# Patient Record
Sex: Male | Born: 1945 | Race: White | Hispanic: No | Marital: Single | Smoking: Former smoker
Health system: Southern US, Community
[De-identification: ages and names within clinical notes are randomized; demographics above are authoritative.]

## PROBLEM LIST (undated history)

## (undated) DIAGNOSIS — F329 Major depressive disorder, single episode, unspecified: Secondary | ICD-10-CM

## (undated) DIAGNOSIS — Z66 Do not resuscitate: Secondary | ICD-10-CM

## (undated) DIAGNOSIS — R188 Other ascites: Secondary | ICD-10-CM

## (undated) DIAGNOSIS — K429 Umbilical hernia without obstruction or gangrene: Secondary | ICD-10-CM

## (undated) DIAGNOSIS — I10 Essential (primary) hypertension: Secondary | ICD-10-CM

## (undated) DIAGNOSIS — F419 Anxiety disorder, unspecified: Secondary | ICD-10-CM

## (undated) DIAGNOSIS — E871 Hypo-osmolality and hyponatremia: Secondary | ICD-10-CM

## (undated) DIAGNOSIS — K219 Gastro-esophageal reflux disease without esophagitis: Secondary | ICD-10-CM

## (undated) DIAGNOSIS — F32A Depression, unspecified: Secondary | ICD-10-CM

## (undated) DIAGNOSIS — K7460 Unspecified cirrhosis of liver: Secondary | ICD-10-CM

## (undated) HISTORY — PX: COLOSTOMY: SHX63

## (undated) HISTORY — PX: COLON SURGERY: SHX602

## (undated) HISTORY — PX: CHOLECYSTECTOMY: SHX55

## (undated) HISTORY — PX: THROAT SURGERY: SHX803

---

## 2011-06-11 ENCOUNTER — Ambulatory Visit: Payer: Self-pay | Admitting: Internal Medicine

## 2011-06-18 ENCOUNTER — Inpatient Hospital Stay: Payer: Self-pay | Admitting: Internal Medicine

## 2011-07-12 ENCOUNTER — Ambulatory Visit: Payer: Self-pay | Admitting: Internal Medicine

## 2011-07-24 ENCOUNTER — Ambulatory Visit: Payer: Self-pay

## 2012-06-10 ENCOUNTER — Ambulatory Visit: Payer: Self-pay | Admitting: Unknown Physician Specialty

## 2013-01-05 ENCOUNTER — Ambulatory Visit: Payer: Self-pay | Admitting: Family Medicine

## 2013-08-04 ENCOUNTER — Inpatient Hospital Stay: Payer: Self-pay | Admitting: Student

## 2013-08-04 LAB — COMPREHENSIVE METABOLIC PANEL
Albumin: 3.3 g/dL — ABNORMAL LOW (ref 3.4–5.0)
Alkaline Phosphatase: 200 U/L — ABNORMAL HIGH (ref 50–136)
Anion Gap: 15 (ref 7–16)
Bilirubin,Total: 3.4 mg/dL — ABNORMAL HIGH (ref 0.2–1.0)
Calcium, Total: 7.8 mg/dL — ABNORMAL LOW (ref 8.5–10.1)
Chloride: 94 mmol/L — ABNORMAL LOW (ref 98–107)
Co2: 21 mmol/L (ref 21–32)
Creatinine: 0.84 mg/dL (ref 0.60–1.30)
EGFR (Non-African Amer.): >60
Glucose: 97 mg/dL (ref 65–99)
Osmolality: 260 (ref 275–301)
Potassium: 3.3 mmol/L — ABNORMAL LOW (ref 3.5–5.1)
SGOT(AST): 262 U/L — ABNORMAL HIGH (ref 15–37)
Total Protein: 7.3 g/dL (ref 6.4–8.2)

## 2013-08-04 LAB — ETHANOL: Ethanol: 192 mg/dL

## 2013-08-04 LAB — URINALYSIS, COMPLETE
Bilirubin,UR: NEGATIVE
Glucose,UR: NEGATIVE mg/dL (ref 0–75)
Ph: 6 (ref 4.5–8.0)
Protein: NEGATIVE
Squamous Epithelial: NONE SEEN
WBC UR: <1 /HPF (ref 0–5)

## 2013-08-04 LAB — DRUG SCREEN, URINE
Barbiturates, Ur Screen: NEGATIVE (ref ?–200)
Cannabinoid 50 Ng, Ur ~~LOC~~: NEGATIVE (ref ?–50)
Cocaine Metabolite,Ur ~~LOC~~: NEGATIVE (ref ?–300)
Methadone, Ur Screen: NEGATIVE (ref ?–300)
Phencyclidine (PCP) Ur S: NEGATIVE (ref ?–25)

## 2013-08-04 LAB — CBC
HCT: 30.4 % — ABNORMAL LOW (ref 40.0–52.0)
HGB: 10.5 g/dL — ABNORMAL LOW (ref 13.0–18.0)
MCHC: 34.6 g/dL (ref 32.0–36.0)
MCV: 85 fL (ref 80–100)
Platelet: 51 10*3/uL — ABNORMAL LOW (ref 150–440)
RDW: 20.9 % — ABNORMAL HIGH (ref 11.5–14.5)

## 2013-08-04 LAB — PROTIME-INR
INR: 1.2
Prothrombin Time: 14.9 secs — ABNORMAL HIGH (ref 11.5–14.7)

## 2013-08-05 LAB — MAGNESIUM: Magnesium: 2.2 mg/dL

## 2013-08-05 LAB — CBC WITH DIFFERENTIAL/PLATELET
Basophil %: 1 %
Eosinophil #: 0 10*3/uL (ref 0.0–0.7)
HCT: 29.8 % — ABNORMAL LOW (ref 40.0–52.0)
HGB: 10.1 g/dL — ABNORMAL LOW (ref 13.0–18.0)
Lymphocyte #: 0.4 10*3/uL — ABNORMAL LOW (ref 1.0–3.6)
Lymphocyte %: 17 %
MCH: 29.4 pg (ref 26.0–34.0)
MCHC: 33.9 g/dL (ref 32.0–36.0)
Monocyte #: 0.4 x10 3/mm (ref 0.2–1.0)
Neutrophil #: 1.5 10*3/uL (ref 1.4–6.5)
Neutrophil %: 65.4 %
RBC: 3.44 10*6/uL — ABNORMAL LOW (ref 4.40–5.90)
WBC: 2.3 10*3/uL — ABNORMAL LOW (ref 3.8–10.6)

## 2013-08-05 LAB — COMPREHENSIVE METABOLIC PANEL
Albumin: 3 g/dL — ABNORMAL LOW (ref 3.4–5.0)
Alkaline Phosphatase: 180 U/L — ABNORMAL HIGH (ref 50–136)
Anion Gap: 7 (ref 7–16)
BUN: 11 mg/dL (ref 7–18)
Bilirubin,Total: 3.3 mg/dL — ABNORMAL HIGH (ref 0.2–1.0)
Calcium, Total: 7.3 mg/dL — ABNORMAL LOW (ref 8.5–10.1)
Chloride: 100 mmol/L (ref 98–107)
Co2: 26 mmol/L (ref 21–32)
Creatinine: 1.04 mg/dL (ref 0.60–1.30)
EGFR (African American): >60
EGFR (Non-African Amer.): >60
Glucose: 108 mg/dL — ABNORMAL HIGH (ref 65–99)
SGOT(AST): 261 U/L — ABNORMAL HIGH (ref 15–37)
SGPT (ALT): 84 U/L — ABNORMAL HIGH (ref 12–78)
Sodium: 133 mmol/L — ABNORMAL LOW (ref 136–145)
Total Protein: 6.7 g/dL (ref 6.4–8.2)

## 2013-08-05 LAB — TSH: Thyroid Stimulating Horm: 3.29 u[IU]/mL

## 2013-08-05 LAB — LIPID PANEL
Cholesterol: 282 mg/dL — ABNORMAL HIGH (ref 0–200)
HDL Cholesterol: 55 mg/dL (ref 40–60)
Ldl Cholesterol, Calc: 212 mg/dL — ABNORMAL HIGH (ref 0–100)

## 2013-08-06 LAB — LIPASE, BLOOD: Lipase: 558 U/L — ABNORMAL HIGH (ref 73–393)

## 2013-08-07 LAB — BASIC METABOLIC PANEL
Calcium, Total: 7.2 mg/dL — ABNORMAL LOW (ref 8.5–10.1)
Chloride: 102 mmol/L (ref 98–107)
Creatinine: 0.9 mg/dL (ref 0.60–1.30)
EGFR (African American): >60
Glucose: 97 mg/dL (ref 65–99)
Potassium: 3.7 mmol/L (ref 3.5–5.1)

## 2013-08-07 LAB — CBC WITH DIFFERENTIAL/PLATELET
Eosinophil %: 1.8 %
HCT: 30.3 % — ABNORMAL LOW (ref 40.0–52.0)
HGB: 10.2 g/dL — ABNORMAL LOW (ref 13.0–18.0)
Lymphocyte %: 24.1 %
MCH: 29.7 pg (ref 26.0–34.0)
MCHC: 33.6 g/dL (ref 32.0–36.0)
MCV: 88 fL (ref 80–100)
Monocyte #: 0.4 x10 3/mm (ref 0.2–1.0)
Monocyte %: 17.2 %
Neutrophil #: 1.2 10*3/uL — ABNORMAL LOW (ref 1.4–6.5)
RDW: 21.8 % — ABNORMAL HIGH (ref 11.5–14.5)
WBC: 2.2 10*3/uL — ABNORMAL LOW (ref 3.8–10.6)

## 2013-08-07 LAB — OCCULT BLOOD X 1 CARD TO LAB, STOOL: Occult Blood, Feces: POSITIVE

## 2013-08-08 LAB — CBC WITH DIFFERENTIAL/PLATELET
Basophil %: 1.7 %
Eosinophil #: 0 10*3/uL (ref 0.0–0.7)
Eosinophil %: 1.5 %
HCT: 30.1 % — ABNORMAL LOW (ref 40.0–52.0)
Lymphocyte #: 0.5 10*3/uL — ABNORMAL LOW (ref 1.0–3.6)
Lymphocyte %: 24.9 %
MCHC: 33.6 g/dL (ref 32.0–36.0)
Neutrophil #: 1 10*3/uL — ABNORMAL LOW (ref 1.4–6.5)
Neutrophil %: 53.5 %
Platelet: 65 10*3/uL — ABNORMAL LOW (ref 150–440)
RDW: 21.7 % — ABNORMAL HIGH (ref 11.5–14.5)
WBC: 1.9 10*3/uL — CL (ref 3.8–10.6)

## 2013-08-09 LAB — CBC WITH DIFFERENTIAL/PLATELET
Basophil #: 0 10*3/uL (ref 0.0–0.1)
Eosinophil #: 0 10*3/uL (ref 0.0–0.7)
MCH: 29.7 pg (ref 26.0–34.0)
MCHC: 33.5 g/dL (ref 32.0–36.0)
Monocyte #: 0.5 x10 3/mm (ref 0.2–1.0)
Monocyte %: 18.9 %
Platelet: 81 10*3/uL — ABNORMAL LOW (ref 150–440)
RDW: 22.2 % — ABNORMAL HIGH (ref 11.5–14.5)
WBC: 2.4 10*3/uL — ABNORMAL LOW (ref 3.8–10.6)

## 2013-08-09 LAB — CULTURE, BLOOD (SINGLE)

## 2014-02-05 ENCOUNTER — Emergency Department: Payer: Self-pay | Admitting: Internal Medicine

## 2014-02-05 LAB — DRUG SCREEN, URINE
AMPHETAMINES, UR SCREEN: NEGATIVE (ref ?–1000)
Barbiturates, Ur Screen: NEGATIVE (ref ?–200)
Benzodiazepine, Ur Scrn: NEGATIVE (ref ?–200)
Cannabinoid 50 Ng, Ur ~~LOC~~: NEGATIVE (ref ?–50)
Cocaine Metabolite,Ur ~~LOC~~: NEGATIVE (ref ?–300)
MDMA (Ecstasy)Ur Screen: NEGATIVE (ref ?–500)
Methadone, Ur Screen: NEGATIVE (ref ?–300)
Opiate, Ur Screen: POSITIVE (ref ?–300)
PHENCYCLIDINE (PCP) UR S: NEGATIVE (ref ?–25)
TRICYCLIC, UR SCREEN: NEGATIVE (ref ?–1000)

## 2014-02-05 LAB — LIPASE, BLOOD: LIPASE: 157 U/L (ref 73–393)

## 2014-02-05 LAB — URINALYSIS, COMPLETE
BILIRUBIN, UR: NEGATIVE
Bacteria: NONE SEEN
GLUCOSE, UR: NEGATIVE mg/dL (ref 0–75)
KETONE: NEGATIVE
LEUKOCYTE ESTERASE: NEGATIVE
Nitrite: NEGATIVE
PH: 6 (ref 4.5–8.0)
Protein: NEGATIVE
RBC,UR: 8 /HPF (ref 0–5)
SPECIFIC GRAVITY: 1.003 (ref 1.003–1.030)
Squamous Epithelial: NONE SEEN

## 2014-02-05 LAB — CBC
HCT: 33.9 % — ABNORMAL LOW (ref 40.0–52.0)
HGB: 11.5 g/dL — ABNORMAL LOW (ref 13.0–18.0)
MCH: 33 pg (ref 26.0–34.0)
MCHC: 33.8 g/dL (ref 32.0–36.0)
MCV: 98 fL (ref 80–100)
Platelet: 206 10*3/uL (ref 150–440)
RBC: 3.47 10*6/uL — AB (ref 4.40–5.90)
RDW: 18.9 % — AB (ref 11.5–14.5)
WBC: 7.1 10*3/uL (ref 3.8–10.6)

## 2014-02-05 LAB — BODY FLUID CELL COUNT WITH DIFFERENTIAL
Basophil: 0 %
Eosinophil: 0 %
Lymphocytes: 59 %
NUCLEATED CELL COUNT: 46 /mm3
Neutrophils: 21 %
OTHER CELLS BF: 0 %
Other Mononuclear Cells: 20 %

## 2014-02-05 LAB — COMPREHENSIVE METABOLIC PANEL
ALK PHOS: 299 U/L — AB
AST: 152 U/L — AB (ref 15–37)
Albumin: 2 g/dL — ABNORMAL LOW (ref 3.4–5.0)
Anion Gap: 6 — ABNORMAL LOW (ref 7–16)
BUN: 4 mg/dL — ABNORMAL LOW (ref 7–18)
Bilirubin,Total: 3.5 mg/dL — ABNORMAL HIGH (ref 0.2–1.0)
CALCIUM: 7.9 mg/dL — AB (ref 8.5–10.1)
CHLORIDE: 100 mmol/L (ref 98–107)
Co2: 27 mmol/L (ref 21–32)
Creatinine: 0.97 mg/dL (ref 0.60–1.30)
EGFR (African American): >60
EGFR (Non-African Amer.): >60
Glucose: 96 mg/dL (ref 65–99)
Osmolality: 263 (ref 275–301)
Potassium: 3.7 mmol/L (ref 3.5–5.1)
SGPT (ALT): 34 U/L (ref 12–78)
Sodium: 133 mmol/L — ABNORMAL LOW (ref 136–145)
Total Protein: 7.7 g/dL (ref 6.4–8.2)

## 2014-02-05 LAB — PROTIME-INR
INR: 1.5
Prothrombin Time: 17.5 secs — ABNORMAL HIGH (ref 11.5–14.7)

## 2014-02-05 LAB — ETHANOL: Ethanol %: <0.003 % (ref 0.000–0.080)

## 2014-02-05 LAB — SALICYLATE LEVEL: Salicylates, Serum: <1.7 mg/dL

## 2014-02-05 LAB — TSH: Thyroid Stimulating Horm: 4.61 u[IU]/mL — ABNORMAL HIGH

## 2014-02-05 LAB — AMMONIA: AMMONIA, PLASMA: 52 umol/L — AB (ref 11–32)

## 2014-02-05 LAB — MAGNESIUM: MAGNESIUM: 1.9 mg/dL

## 2014-02-05 LAB — ACETAMINOPHEN LEVEL

## 2014-02-09 LAB — BODY FLUID CULTURE

## 2014-02-21 ENCOUNTER — Emergency Department: Payer: Self-pay | Admitting: Emergency Medicine

## 2014-02-21 LAB — CBC
HCT: 33.8 % — AB (ref 40.0–52.0)
HGB: 11 g/dL — AB (ref 13.0–18.0)
MCH: 32.4 pg (ref 26.0–34.0)
MCHC: 32.7 g/dL (ref 32.0–36.0)
MCV: 99 fL (ref 80–100)
Platelet: 201 10*3/uL (ref 150–440)
RBC: 3.4 10*6/uL — ABNORMAL LOW (ref 4.40–5.90)
RDW: 16.3 % — ABNORMAL HIGH (ref 11.5–14.5)
WBC: 5.6 10*3/uL (ref 3.8–10.6)

## 2014-02-21 LAB — COMPREHENSIVE METABOLIC PANEL
ALBUMIN: 2 g/dL — AB (ref 3.4–5.0)
ALK PHOS: 196 U/L — AB
ALT: 26 U/L (ref 12–78)
ANION GAP: 3 — AB (ref 7–16)
AST: 89 U/L — AB (ref 15–37)
BUN: 8 mg/dL (ref 7–18)
Bilirubin,Total: 1.8 mg/dL — ABNORMAL HIGH (ref 0.2–1.0)
CHLORIDE: 103 mmol/L (ref 98–107)
CREATININE: 1.23 mg/dL (ref 0.60–1.30)
Calcium, Total: 8 mg/dL — ABNORMAL LOW (ref 8.5–10.1)
Co2: 30 mmol/L (ref 21–32)
GLUCOSE: 95 mg/dL (ref 65–99)
Osmolality: 270 (ref 275–301)
Potassium: 3.4 mmol/L — ABNORMAL LOW (ref 3.5–5.1)
SODIUM: 136 mmol/L (ref 136–145)
TOTAL PROTEIN: 7.2 g/dL (ref 6.4–8.2)

## 2014-02-21 LAB — AMMONIA: AMMONIA, PLASMA: 53 umol/L — AB (ref 11–32)

## 2014-02-21 LAB — LIPASE, BLOOD: LIPASE: 156 U/L (ref 73–393)

## 2014-04-17 ENCOUNTER — Ambulatory Visit: Payer: Self-pay | Admitting: Family Medicine

## 2014-04-17 LAB — BODY FLUID CELL COUNT WITH DIFFERENTIAL
BASOS ABS: 0 %
EOS PCT: 0 %
LYMPHS PCT: 26 %
Neutrophils: 1 %
Nucleated Cell Count: 172 /mm3
OTHER CELLS BF: 0 %
OTHER MONONUCLEAR CELLS: 73 %

## 2014-04-17 LAB — ALBUMIN, FLUID (OTHER): Body Fluid Albumin: <0.6 g/dL

## 2014-04-21 LAB — BODY FLUID CULTURE

## 2014-05-21 ENCOUNTER — Emergency Department: Payer: Self-pay | Admitting: Emergency Medicine

## 2014-05-21 LAB — BODY FLUID CELL COUNT WITH DIFFERENTIAL
Basophil: 0 %
Eosinophil: 0 %
Lymphocytes: 67 %
Neutrophils: 17 %
Nucleated Cell Count: 152 /mm3
OTHER CELLS BF: 0 %
OTHER MONONUCLEAR CELLS: 16 %

## 2014-05-21 LAB — URINALYSIS, COMPLETE
BILIRUBIN, UR: NEGATIVE
Bacteria: NONE SEEN
Glucose,UR: NEGATIVE mg/dL (ref 0–75)
Ketone: NEGATIVE
Leukocyte Esterase: NEGATIVE
Nitrite: NEGATIVE
PROTEIN: NEGATIVE
Ph: 8 (ref 4.5–8.0)
RBC,UR: 8 /HPF (ref 0–5)
SQUAMOUS EPITHELIAL: NONE SEEN
Specific Gravity: 1.005 (ref 1.003–1.030)

## 2014-05-21 LAB — PROTIME-INR
INR: 1.3
PROTHROMBIN TIME: 15.9 s — AB (ref 11.5–14.7)

## 2014-05-21 LAB — COMPREHENSIVE METABOLIC PANEL
ALK PHOS: 173 U/L — AB
ALT: 17 U/L (ref 12–78)
ANION GAP: 8 (ref 7–16)
Albumin: 2.3 g/dL — ABNORMAL LOW (ref 3.4–5.0)
BILIRUBIN TOTAL: 1.7 mg/dL — AB (ref 0.2–1.0)
BUN: 8 mg/dL (ref 7–18)
Calcium, Total: 7.8 mg/dL — ABNORMAL LOW (ref 8.5–10.1)
Chloride: 103 mmol/L (ref 98–107)
Co2: 28 mmol/L (ref 21–32)
Creatinine: 1.22 mg/dL (ref 0.60–1.30)
EGFR (African American): >60
EGFR (Non-African Amer.): >60
GLUCOSE: 101 mg/dL — AB (ref 65–99)
Osmolality: 276 (ref 275–301)
Potassium: 3.2 mmol/L — ABNORMAL LOW (ref 3.5–5.1)
SGOT(AST): 53 U/L — ABNORMAL HIGH (ref 15–37)
Sodium: 139 mmol/L (ref 136–145)
Total Protein: 8.1 g/dL (ref 6.4–8.2)

## 2014-05-21 LAB — LIPASE, BLOOD: Lipase: 168 U/L (ref 73–393)

## 2014-05-21 LAB — CBC
HCT: 34.6 % — ABNORMAL LOW (ref 40.0–52.0)
HGB: 11.4 g/dL — ABNORMAL LOW (ref 13.0–18.0)
MCH: 29.9 pg (ref 26.0–34.0)
MCHC: 33 g/dL (ref 32.0–36.0)
MCV: 91 fL (ref 80–100)
Platelet: 227 10*3/uL (ref 150–440)
RBC: 3.82 10*6/uL — AB (ref 4.40–5.90)
RDW: 14.8 % — ABNORMAL HIGH (ref 11.5–14.5)
WBC: 8.1 10*3/uL (ref 3.8–10.6)

## 2014-05-21 LAB — TROPONIN I: Troponin-I: <0.02 ng/mL

## 2014-05-21 LAB — PRO B NATRIURETIC PEPTIDE: B-Type Natriuretic Peptide: 804 pg/mL — ABNORMAL HIGH (ref 0–125)

## 2014-05-22 ENCOUNTER — Emergency Department: Payer: Self-pay | Admitting: Emergency Medicine

## 2014-05-25 LAB — URINALYSIS, COMPLETE
BACTERIA: NONE SEEN
Bilirubin,UR: NEGATIVE
GLUCOSE, UR: NEGATIVE mg/dL (ref 0–75)
Ketone: NEGATIVE
LEUKOCYTE ESTERASE: NEGATIVE
NITRITE: NEGATIVE
PH: 6 (ref 4.5–8.0)
Protein: NEGATIVE
RBC,UR: 4 /HPF (ref 0–5)
Specific Gravity: 1.005 (ref 1.003–1.030)
Squamous Epithelial: NONE SEEN
WBC UR: NONE SEEN /HPF (ref 0–5)

## 2014-05-25 LAB — COMPREHENSIVE METABOLIC PANEL
ALBUMIN: 2.1 g/dL — AB (ref 3.4–5.0)
Alkaline Phosphatase: 160 U/L — ABNORMAL HIGH
Anion Gap: 7 (ref 7–16)
BUN: 8 mg/dL (ref 7–18)
Bilirubin,Total: 1.1 mg/dL — ABNORMAL HIGH (ref 0.2–1.0)
CO2: 27 mmol/L (ref 21–32)
CREATININE: 1.24 mg/dL (ref 0.60–1.30)
Calcium, Total: 7.6 mg/dL — ABNORMAL LOW (ref 8.5–10.1)
Chloride: 105 mmol/L (ref 98–107)
GFR CALC NON AF AMER: 60 — AB
Glucose: 100 mg/dL — ABNORMAL HIGH (ref 65–99)
Osmolality: 276 (ref 275–301)
POTASSIUM: 3.1 mmol/L — AB (ref 3.5–5.1)
SGOT(AST): 56 U/L — ABNORMAL HIGH (ref 15–37)
SGPT (ALT): 23 U/L (ref 12–78)
Sodium: 139 mmol/L (ref 136–145)
TOTAL PROTEIN: 7.6 g/dL (ref 6.4–8.2)

## 2014-05-25 LAB — ETHANOL: Ethanol %: <0.003 % (ref 0.000–0.080)

## 2014-05-25 LAB — CBC
HCT: 33 % — AB (ref 40.0–52.0)
HGB: 10.8 g/dL — ABNORMAL LOW (ref 13.0–18.0)
MCH: 29.9 pg (ref 26.0–34.0)
MCHC: 32.8 g/dL (ref 32.0–36.0)
MCV: 91 fL (ref 80–100)
Platelet: 176 10*3/uL (ref 150–440)
RBC: 3.63 10*6/uL — ABNORMAL LOW (ref 4.40–5.90)
RDW: 14.9 % — ABNORMAL HIGH (ref 11.5–14.5)
WBC: 6.4 10*3/uL (ref 3.8–10.6)

## 2014-05-25 LAB — BODY FLUID CULTURE

## 2014-05-25 LAB — ACETAMINOPHEN LEVEL: Acetaminophen: <2 ug/mL

## 2014-05-25 LAB — DRUG SCREEN, URINE

## 2014-05-25 LAB — SALICYLATE LEVEL: Salicylates, Serum: <1.7 mg/dL

## 2014-05-25 LAB — AMMONIA: Ammonia, Plasma: 14 mcmol/L (ref 11–32)

## 2014-05-26 ENCOUNTER — Inpatient Hospital Stay: Payer: Self-pay | Admitting: Psychiatry

## 2014-05-27 LAB — COMPREHENSIVE METABOLIC PANEL
Albumin: 2.2 g/dL — ABNORMAL LOW (ref 3.4–5.0)
Alkaline Phosphatase: 149 U/L — ABNORMAL HIGH
Anion Gap: 8 (ref 7–16)
BUN: 9 mg/dL (ref 7–18)
Bilirubin,Total: 1.3 mg/dL — ABNORMAL HIGH (ref 0.2–1.0)
CHLORIDE: 106 mmol/L (ref 98–107)
CREATININE: 1.2 mg/dL (ref 0.60–1.30)
Calcium, Total: 7.5 mg/dL — ABNORMAL LOW (ref 8.5–10.1)
Co2: 27 mmol/L (ref 21–32)
EGFR (Non-African Amer.): >60
Glucose: 122 mg/dL — ABNORMAL HIGH (ref 65–99)
OSMOLALITY: 281 (ref 275–301)
Potassium: 3.2 mmol/L — ABNORMAL LOW (ref 3.5–5.1)
SGOT(AST): 55 U/L — ABNORMAL HIGH (ref 15–37)
SGPT (ALT): 18 U/L (ref 12–78)
SODIUM: 141 mmol/L (ref 136–145)
Total Protein: 7.7 g/dL (ref 6.4–8.2)

## 2014-05-29 LAB — COMPREHENSIVE METABOLIC PANEL
Albumin: 1.8 g/dL — ABNORMAL LOW (ref 3.4–5.0)
Alkaline Phosphatase: 132 U/L — ABNORMAL HIGH
Anion Gap: 5 — ABNORMAL LOW (ref 7–16)
BUN: 9 mg/dL (ref 7–18)
Bilirubin,Total: 1.3 mg/dL — ABNORMAL HIGH (ref 0.2–1.0)
CALCIUM: 7.2 mg/dL — AB (ref 8.5–10.1)
CHLORIDE: 105 mmol/L (ref 98–107)
CO2: 28 mmol/L (ref 21–32)
CREATININE: 1.14 mg/dL (ref 0.60–1.30)
EGFR (African American): >60
GLUCOSE: 121 mg/dL — AB (ref 65–99)
Osmolality: 276 (ref 275–301)
Potassium: 3.6 mmol/L (ref 3.5–5.1)
SGOT(AST): 54 U/L — ABNORMAL HIGH (ref 15–37)
SGPT (ALT): 20 U/L (ref 12–78)
SODIUM: 138 mmol/L (ref 136–145)
Total Protein: 6.6 g/dL (ref 6.4–8.2)

## 2014-06-28 ENCOUNTER — Ambulatory Visit: Payer: Self-pay | Admitting: Unknown Physician Specialty

## 2014-11-05 ENCOUNTER — Inpatient Hospital Stay: Payer: Self-pay | Admitting: Internal Medicine

## 2014-11-05 LAB — PROTIME-INR
INR: 1.4
PROTHROMBIN TIME: 16.5 s — AB (ref 11.5–14.7)

## 2014-11-05 LAB — COMPREHENSIVE METABOLIC PANEL
ALT: 26 U/L
ANION GAP: 10 (ref 7–16)
AST: 67 U/L — AB (ref 15–37)
Albumin: 2 g/dL — ABNORMAL LOW (ref 3.4–5.0)
Alkaline Phosphatase: 207 U/L — ABNORMAL HIGH
BILIRUBIN TOTAL: 1.4 mg/dL — AB (ref 0.2–1.0)
BUN: 10 mg/dL (ref 7–18)
CO2: 26 mmol/L (ref 21–32)
CREATININE: 1.14 mg/dL (ref 0.60–1.30)
Calcium, Total: 7.7 mg/dL — ABNORMAL LOW (ref 8.5–10.1)
Chloride: 100 mmol/L (ref 98–107)
EGFR (Non-African Amer.): >60
GLUCOSE: 109 mg/dL — AB (ref 65–99)
OSMOLALITY: 272 (ref 275–301)
POTASSIUM: 3.6 mmol/L (ref 3.5–5.1)
SODIUM: 136 mmol/L (ref 136–145)
TOTAL PROTEIN: 7.4 g/dL (ref 6.4–8.2)

## 2014-11-05 LAB — URINALYSIS, COMPLETE
Bacteria: NONE SEEN
Bilirubin,UR: NEGATIVE
GLUCOSE, UR: NEGATIVE mg/dL (ref 0–75)
Ketone: NEGATIVE
Leukocyte Esterase: NEGATIVE
NITRITE: NEGATIVE
Ph: 6 (ref 4.5–8.0)
Protein: NEGATIVE
SPECIFIC GRAVITY: 1.003 (ref 1.003–1.030)
SQUAMOUS EPITHELIAL: NONE SEEN

## 2014-11-05 LAB — CBC
HCT: 34.2 % — AB (ref 40.0–52.0)
HGB: 11.1 g/dL — ABNORMAL LOW (ref 13.0–18.0)
MCH: 29 pg (ref 26.0–34.0)
MCHC: 32.4 g/dL (ref 32.0–36.0)
MCV: 90 fL (ref 80–100)
Platelet: 339 10*3/uL (ref 150–440)
RBC: 3.81 10*6/uL — ABNORMAL LOW (ref 4.40–5.90)
RDW: 14.9 % — ABNORMAL HIGH (ref 11.5–14.5)
WBC: 9 10*3/uL (ref 3.8–10.6)

## 2014-11-05 LAB — APTT: Activated PTT: 31.3 secs (ref 23.6–35.9)

## 2014-11-05 LAB — LIPASE, BLOOD: LIPASE: 244 U/L (ref 73–393)

## 2014-11-06 LAB — COMPREHENSIVE METABOLIC PANEL
ALBUMIN: 1.8 g/dL — AB (ref 3.4–5.0)
ALT: 21 U/L
ANION GAP: 8 (ref 7–16)
AST: 51 U/L — AB (ref 15–37)
Alkaline Phosphatase: 177 U/L — ABNORMAL HIGH
BUN: 11 mg/dL (ref 7–18)
Bilirubin,Total: 1.1 mg/dL — ABNORMAL HIGH (ref 0.2–1.0)
Calcium, Total: 7.2 mg/dL — ABNORMAL LOW (ref 8.5–10.1)
Chloride: 101 mmol/L (ref 98–107)
Co2: 28 mmol/L (ref 21–32)
Creatinine: 1.27 mg/dL (ref 0.60–1.30)
EGFR (Non-African Amer.): >60
Glucose: 94 mg/dL (ref 65–99)
OSMOLALITY: 273 (ref 275–301)
Potassium: 3.4 mmol/L — ABNORMAL LOW (ref 3.5–5.1)
SODIUM: 137 mmol/L (ref 136–145)
Total Protein: 6.6 g/dL (ref 6.4–8.2)

## 2014-11-06 LAB — CBC WITH DIFFERENTIAL/PLATELET
BASOS PCT: 0.6 %
Basophil #: 0 10*3/uL (ref 0.0–0.1)
EOS ABS: 0 10*3/uL (ref 0.0–0.7)
Eosinophil %: 0.4 %
HCT: 29.8 % — ABNORMAL LOW (ref 40.0–52.0)
HGB: 10 g/dL — ABNORMAL LOW (ref 13.0–18.0)
LYMPHS ABS: 0.9 10*3/uL — AB (ref 1.0–3.6)
LYMPHS PCT: 13.7 %
MCH: 29.5 pg (ref 26.0–34.0)
MCHC: 33.6 g/dL (ref 32.0–36.0)
MCV: 88 fL (ref 80–100)
Monocyte #: 0.8 x10 3/mm (ref 0.2–1.0)
Monocyte %: 12.2 %
Neutrophil #: 4.6 10*3/uL (ref 1.4–6.5)
Neutrophil %: 73.1 %
PLATELETS: 270 10*3/uL (ref 150–440)
RBC: 3.39 10*6/uL — ABNORMAL LOW (ref 4.40–5.90)
RDW: 14.9 % — ABNORMAL HIGH (ref 11.5–14.5)
WBC: 6.3 10*3/uL (ref 3.8–10.6)

## 2014-11-07 LAB — BASIC METABOLIC PANEL
Anion Gap: 9 (ref 7–16)
BUN: 13 mg/dL (ref 7–18)
CALCIUM: 7.3 mg/dL — AB (ref 8.5–10.1)
CHLORIDE: 101 mmol/L (ref 98–107)
Co2: 28 mmol/L (ref 21–32)
Creatinine: 1.29 mg/dL (ref 0.60–1.30)
EGFR (African American): >60
GFR CALC NON AF AMER: 59 — AB
Glucose: 92 mg/dL (ref 65–99)
OSMOLALITY: 275 (ref 275–301)
Potassium: 3.2 mmol/L — ABNORMAL LOW (ref 3.5–5.1)
Sodium: 138 mmol/L (ref 136–145)

## 2014-11-07 LAB — CBC WITH DIFFERENTIAL/PLATELET
Basophil #: 0 10*3/uL (ref 0.0–0.1)
Basophil %: 0.8 %
EOS ABS: 0 10*3/uL (ref 0.0–0.7)
EOS PCT: 0.7 %
HCT: 28.2 % — ABNORMAL LOW (ref 40.0–52.0)
HGB: 9.4 g/dL — ABNORMAL LOW (ref 13.0–18.0)
LYMPHS ABS: 1 10*3/uL (ref 1.0–3.6)
Lymphocyte %: 20 %
MCH: 29.4 pg (ref 26.0–34.0)
MCHC: 33.2 g/dL (ref 32.0–36.0)
MCV: 89 fL (ref 80–100)
MONO ABS: 0.5 x10 3/mm (ref 0.2–1.0)
MONOS PCT: 10.3 %
NEUTROS PCT: 68.2 %
Neutrophil #: 3.3 10*3/uL (ref 1.4–6.5)
PLATELETS: 236 10*3/uL (ref 150–440)
RBC: 3.19 10*6/uL — ABNORMAL LOW (ref 4.40–5.90)
RDW: 14.8 % — ABNORMAL HIGH (ref 11.5–14.5)
WBC: 4.8 10*3/uL (ref 3.8–10.6)

## 2014-12-11 ENCOUNTER — Ambulatory Visit: Payer: Self-pay | Admitting: Unknown Physician Specialty

## 2014-12-11 LAB — BODY FLUID CELL COUNT WITH DIFFERENTIAL
Basophil: 0 %
Eosinophil: 0 %
Lymphocytes: 52 %
Neutrophils: 15 %
Nucleated Cell Count: 211 /mm3
Other Cells BF: 0 %
Other Mononuclear Cells: 33 %

## 2015-02-01 ENCOUNTER — Emergency Department: Payer: Self-pay | Admitting: Emergency Medicine

## 2015-02-01 LAB — COMPREHENSIVE METABOLIC PANEL
ALBUMIN: 3.1 g/dL — AB
ANION GAP: 9 (ref 7–16)
Alkaline Phosphatase: 112 U/L
BUN: 10 mg/dL
Bilirubin,Total: 0.8 mg/dL
Calcium, Total: 8 mg/dL — ABNORMAL LOW
Chloride: 106 mmol/L
Co2: 23 mmol/L
Creatinine: 1.05 mg/dL
EGFR (African American): >60
EGFR (Non-African Amer.): >60
Glucose: 101 mg/dL — ABNORMAL HIGH
Potassium: 3.7 mmol/L
SGOT(AST): 48 U/L — ABNORMAL HIGH
SGPT (ALT): 19 U/L
SODIUM: 138 mmol/L
Total Protein: 7.1 g/dL

## 2015-02-01 LAB — CBC WITH DIFFERENTIAL/PLATELET
Basophil #: 0 10*3/uL (ref 0.0–0.1)
Basophil %: 0.8 %
EOS PCT: 3 %
Eosinophil #: 0.2 10*3/uL (ref 0.0–0.7)
HCT: 30.8 % — AB (ref 40.0–52.0)
HGB: 10.3 g/dL — AB (ref 13.0–18.0)
LYMPHS ABS: 0.8 10*3/uL — AB (ref 1.0–3.6)
Lymphocyte %: 12.5 %
MCH: 28.1 pg (ref 26.0–34.0)
MCHC: 33.6 g/dL (ref 32.0–36.0)
MCV: 84 fL (ref 80–100)
Monocyte #: 0.6 x10 3/mm (ref 0.2–1.0)
Monocyte %: 8.8 %
Neutrophil #: 4.8 10*3/uL (ref 1.4–6.5)
Neutrophil %: 74.9 %
Platelet: 148 10*3/uL — ABNORMAL LOW (ref 150–440)
RBC: 3.68 10*6/uL — ABNORMAL LOW (ref 4.40–5.90)
RDW: 15.5 % — AB (ref 11.5–14.5)
WBC: 6.5 10*3/uL (ref 3.8–10.6)

## 2015-02-01 LAB — URINALYSIS, COMPLETE
BILIRUBIN, UR: NEGATIVE
Bacteria: NONE SEEN
Blood: NEGATIVE
Glucose,UR: NEGATIVE mg/dL (ref 0–75)
KETONE: NEGATIVE
Leukocyte Esterase: NEGATIVE
Nitrite: NEGATIVE
PH: 8 (ref 4.5–8.0)
Protein: NEGATIVE
RBC,UR: <1 /HPF (ref 0–5)
SPECIFIC GRAVITY: 1.006 (ref 1.003–1.030)
SQUAMOUS EPITHELIAL: NONE SEEN
WBC UR: 1 /HPF (ref 0–5)

## 2015-02-01 LAB — LIPASE, BLOOD: Lipase: 53 U/L — ABNORMAL HIGH

## 2015-03-02 NOTE — Consult Note (Signed)
PATIENT NAME:  Jeff BreslowCARDEN, Jeff W MR#:  [id number] DATE OF BIRTH:  08-10-1946  DATE OF CONSULTATION:  08/06/2013  CONSULTING PHYSICIAN:  Christena DeemMartin U. Skulskie, MD  REASON FOR CONSULTATION: Abnormal CT scan, possible colitis, as well as history of alcohol abuse and alcohol withdrawal.   HISTORY OF PRESENT ILLNESS: Jeff Preston is a 69 year old Caucasian male who has a history of alcohol abuse. He apparently had stopped drinking earlier in the year and was last seen as an outpatient by Dr. Mechele CollinElliott in April. At that time, he apparently was not drinking, although he has a history of end-stage liver disease, history of variceal bleeding, as well as long-term alcohol abuse. He states that about a month ago he restarted drinking. Since that time, he has had problems with abdominal and chest pressure over the period of the past 2 to 3 weeks. He came to the Emergency Room because it seemed to be getting worse. He drinks currently about a 12 to 18-pack of beer daily. He has been having nausea with dry heaves for about 3 weeks. He has noted some mild increase in his girth. He denies any heartburn or dysphagia. He has a daily bowel movement. His appetite has decreased with starting to drink alcohol again. His last alcohol was 2 days ago in the morning. He is currently on a WASP protocol. He has never had a colonoscopy in the past. On CT scanning on the chest, abdomen and pelvis with contrast done 08/04/2013, there were "findings in the right lower quadrant which may represent focal colitis involving the cecum." Alternatively, appendicitis if clinically appropriate could not be excluded. Other etiologies suggested were infectious or inflammatory. He does have findings consistent with history of cirrhosis, including a diffusely low attenuating architecture of the liver, this also appearing atrophic and with a nodular border. The spleen is enlarged at 16.9 cm and there are varices in the splenic hilum. There was no free fluid.  There was a right inguinal hernia, possibly some evidence of cystitis. The patient states that he does not have any lower abdominal pain. Review of labs shows his LFTs to be consistent with alcohol use ongoing. His hemoglobin has been stable since his admission. He does show a thrombocytopenia with platelets under 50 and an INR slightly elevated at 1.2.   GASTROINTESTINAL FAMILY HISTORY: Negative for colorectal cancer. His sister has peptic ulcer disease. He denies having a previous colonoscopy. He had an EGD done 06/19/2011 for hematemesis and melena, this with the finding of grade 3 esophageal varices in the upper third of the esophagus, middle third of the esophagus and lower third of the esophagus. There were 6 bands placed at that time, with incomplete eradication of varices. No bleeding at the end of that procedure was noted. He has been on a nonselective beta blocker at home as well as some omeprazole.   PAST MEDICAL HISTORY: Includes a history of chronic alcoholism, liver cirrhosis, esophageal varices as noted, history of hemorrhoids.   ALLERGIES: HE IS ALLERGIC TO PENICILLIN AND DILANTIN.   OUTPATIENT MEDICATIONS: Include lorazepam 1 mg t.i.d. p.r.n., nadolol 20 mg once a day, omeprazole 20 mg twice a day.   REVIEW OF SYSTEMS: Per admission history and physical noted.   PHYSICAL EXAMINATION: VITAL SIGNS: Temperature is 98.6, pulse 73, respirations 18, blood pressure 145/85, pulse oximetry 97%.  GENERAL: He is a 69 year old Caucasian male in no acute distress. He is somewhat somnolent.  HEENT: Normocephalic, atraumatic. Eyes are anicteric. Nose: Septum midline. Oropharynx: Poor dentition.  NECK: No JVD.  HEART: Regular rate and rhythm.  LUNGS: Clear.  ABDOMEN: Protuberant, relatively soft. I do not feel a fluid wave. Bowel sounds are positive, normoactive. He is nontender.  EXTREMITIES: No clubbing or cyanosis, 1+ edema lower bilaterally.  NEUROLOGICAL: Cranial nerves II through XII  grossly intact. There is no asterixis. Muscle strength bilaterally equal and symmetric.  ANORECTAL: Exam deferred. The patient did have a soft brown-colored stool yesterday that was qualified as being large. The patient denies seeing any blood in the stools. However, he relates seeing some black or tarry stools a week or 2 ago.   LABORATORY AND RADIOLOGICAL DATA: On admission to the hospital, he had a BUN of 10, creatinine 0.84, sodium 130, potassium 3.3. Lipase 517. Ammonia 38. Ethanol 192. Hepatic profile showing a total protein of 7.3, albumin 3.3, total bilirubin 3.4, alkaline phosphatase 200, AST 262, ALT 91. Troponin I less than 0.02. TSH 2.14. Urine drug screen was negative for multiple agents. Hemogram showed a white count of 3.3, hemoglobin and hematocrit 10.5 and 30.4, platelet count of 51; MCV was 85. His pro time was 14.9, INR of 1.2. He has had blood cultures, which were no growth. Urinalysis showed 1+ blood, 1+ ketones. He had a repeat hemogram yesterday showing again a hemoglobin of 10.1, platelet count of 47. His hepatic profile showed albumin of 3.0, total bilirubin 3.3, alkaline phosphatase 180, AST 261, ALT 84.   CT scan as noted above. He had a PA and lateral chest film showing no acute cardiopulmonary disease, hyperinflation consistent with COPD.   ASSESSMENT AND PLAN: 1.  History of alcohol abuse with recurrence. The patient does relate seeing some black stools last week or the week before, however, has been having brown stools since he came into the hospital. His hemoglobin has been stable. He is currently in some mild withdrawal and is medicated for this with WASP protocol.  2.  Abnormal CT scan with finding of possible irritation/colitis noted in the cecum of the colon. Review of this also shows possible differential to include stool. The patient has never had a colonoscopy. Currently, the patient is again being treated for withdrawal. He has been hemodynamically stable and has no  abdominal pain. He has had no further emesis since coming to the hospital. He has never had a colonoscopy. It is of further note that he has a marked thrombocytopenia, likely related with his alcohol abuse. I would recommend luminal evaluation at some point, however, would want him to be out of withdrawal and hopefully, once he has stopped the alcohol, some recovery of the platelet count will occur such that he will not need to have platelets prior to a colonoscopy. Giving him platelets would encourage the development of  antiplatelet antibodies, which would decrease yeast activity for this agent in the future should he need it in regards to gastrointestinal bleeding in light of his history of alcohol abuse, recidivism and history of upper gastrointestinal bleed. Will follow with you.   ____________________________ Christena Deem, MD mus:jm D: 08/06/2013 17:10:32 ET T: 08/06/2013 17:37:35 ET JOB#: [id number]  cc: Christena Deem, MD, <Dictator> Christena Deem MD ELECTRONICALLY SIGNED 08/09/2013 17:08

## 2015-03-02 NOTE — Consult Note (Signed)
Chief Complaint:  Subjective/Chief Complaint seen for abnormal ct.  diarrhea resolved, no abdominal pain, tolerating diet.  had a panic attack today, some nausea associated with this not otherwise.   VITAL SIGNS/ANCILLARY NOTES: **Vital Signs.:   29-Sep-14 05:47  Vital Signs Type Routine  Temperature Temperature (F) 98.6  Celsius 37  Temperature Source oral  Pulse Pulse 84  Respirations Respirations 18  Systolic BP Systolic BP 169  Diastolic BP (mmHg) Diastolic BP (mmHg) 95  Mean BP 119  Pulse Ox % Pulse Ox % 97  Pulse Ox Activity Level  At rest  Oxygen Delivery Room Air/ 21 %    14:00  Vital Signs Type Routine  Temperature Temperature (F) 98.7  Celsius 37  Temperature Source oral  Pulse Pulse 82  Respirations Respirations 19  Systolic BP Systolic BP 158  Diastolic BP (mmHg) Diastolic BP (mmHg) 87  Mean BP 110  Pulse Ox % Pulse Ox % 96  Pulse Ox Activity Level  At rest  Oxygen Delivery Room Air/ 21 %   Brief Assessment:  Cardiac Regular   Respiratory clear BS   Gastrointestinal details normal Soft  Nontender  Bowel sounds normal  protuberant/obese, no overt ascites/fluid wave.   Lab Results: Routine Chem:  29-Sep-14 05:07   Result Comment WBC - NOTIFIED OF CRITICAL VALUE  - RESULTS VERIFIED BY REPEAT TESTING.  - CALLED TO THERESA AUSTIN:08/08/13 @  - [id number].Marland Kitchen...tpl  - READ-BACK PROCESS PERFORMED.  Result(s) reported on 08 Aug 2013 at 05:39AM.  Routine Hem:  26-Sep-14 06:14   WBC (CBC)  2.3  Hemoglobin (CBC)  10.1  Platelet Count (CBC)  47  28-Sep-14 04:39   WBC (CBC)  2.2  Hemoglobin (CBC)  10.2  Platelet Count (CBC)  54  29-Sep-14 05:07   WBC (CBC)  1.9  RBC (CBC)  3.42  Hemoglobin (CBC)  10.1  Hematocrit (CBC)  30.1  Platelet Count (CBC)  65  MCV 88  MCH 29.6  MCHC 33.6  RDW  21.7  Neutrophil % 53.5  Lymphocyte % 24.9  Monocyte % 18.4  Eosinophil % 1.5  Basophil % 1.7  Neutrophil #  1.0  Lymphocyte #  0.5  Monocyte # 0.3  Eosinophil # 0.0   Basophil # 0.0   Assessment/Plan:  Assessment/Plan:  Assessment 1) etoh abuse, recent recurrent use after stoping for several months.  2) recurrent panic attacks 3) abnormal ct with possible colitis in cecum?-no abdominalpain or diarrhea 4) etoh withdrawl-resolved 5) thrombocytopenia, leukopenia   Plan 1) encouraged abstinance. 2) platelets improving some however now neutrapenic.  Colonoscopy and possible egd when clinically feasible, this can be done as outpatient.   discussed with Dr Jacques NavyAhmadzia.   Electronic Signatures: Barnetta ChapelSkulskie, Carlon Chaloux (MD)  (Signed 29-Sep-14 19:57)  Authored: Chief Complaint, VITAL SIGNS/ANCILLARY NOTES, Brief Assessment, Lab Results, Assessment/Plan   Last Updated: 29-Sep-14 19:57 by Barnetta ChapelSkulskie, Gene Glazebrook (MD)

## 2015-03-02 NOTE — H&P (Signed)
PATIENT NAME:  Jeff BreslowCARDEN, Jeff W MR#:  [id number] DATE OF BIRTH:  12/16/1945  DATE OF ADMISSION:  08/04/2013  PRIMARY CARE PHYSICIAN: [facility].   PRIMARY GASTROENTEROLOGIST: Scot Junobert T. [name], MD   CHIEF COMPLAINT: Vomiting and dark stool with abdominal pain.   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 He also had history of alcohol-induced esophageal varices and bleed in 2012, and he continued to drink alcohol. Now, for the last few days, he is trying to cut down alcohol, and he is feeling anxious and has repeated episodes of abdominal, epigastric and chest pain, which is on and off, and he also has been vomiting multiple times for the last 4 days. The vomitus is just clear liquid, no blood present in that. His wife also noticed that his stool is black-colored, which is solid, but black. His abdomen also feels a little distended, and he feels bloated for the last few days, but denies any swelling on his legs. He did not have any fever or chills, but there was a little shaking, but wife thinks that might be because of his alcohol withdrawal as he is trying to cut down. In the ER, CT of the abdomen was done which showed some colitis, and his alcohol level is high in the blood, and so possibly he is going in withdrawal, so hospitalist service is being contacted to treat him for colitis and alcohol withdrawal.   REVIEW OF SYSTEMS:  CONSTITUTIONAL: Negative for fever, fatigue, weakness, pain or weight loss.  EYES: No blurring, double vision or discharge.  EARS, NOSE, THROAT: No tinnitus, ear pain or hearing loss.  RESPIRATORY: No cough, wheezing, hemoptysis or shortness of breath.  CARDIOVASCULAR: Has some chest pain, but denies any palpitation, arrhythmia.  GASTROINTESTINAL: Has nausea and vomiting, but no diarrhea. His stool is black-colored but regular consistency, and abdominal pain is there,  which is on and off, 4 to 5 out of 10, and all over, mostly in epigastric and left lower region.  GENITOURINARY: No dysuria, hematuria or increased frequency.  ENDOCRINE: No increased sweating, heat or cold intolerance  SKIN: No acne, rashes or lesions on the skin.  MUSCULOSKELETAL: No pain or swelling in the joints.  NEUROLOGICAL: No numbness, weakness. Has some tremors. No headache.  PSYCHIATRIC: He feels a little anxious, but denies any insomnia, bipolar disorder.   PAST MEDICAL HISTORY:  1. Chronic alcoholism, drinks 12- to 16-pack of beer daily for many years.  2. Cirrhosis of liver, alcohol induced.  3. Esophageal varices and bleed in the past.  4. History of hemorrhoid.   SOCIAL HISTORY: He lives at home with wife. Drinks 12- to 16-pack of beer every day. No smoking. No drug use.   HOME MEDICATIONS:  1. Omeprazole 20 mg 2 times a day.  2. Nadolol 20 mg once a day.  3. Lorazepam 1 mg half-tablet 3 times a day.   PAST SURGICAL HISTORY: Cholecystectomy.   FAMILY HISTORY: Positive for heart disease, dementia and myocardial infarction in mother. Diabetes also runs in the family.  ALLERGIES: PENICILLIN AND DILANTIN.   PHYSICAL EXAMINATION:  VITAL SIGNS: In the ER, temperature 98, pulse of 86, respiration 20, blood pressure on presentation was 184/84, currently 140/74, and oxygen saturation 99 on room air.  GENERAL: The patient is fully alert and oriented to time, place and person, appears slightly anxious.  HEAD AND NECK: Atraumatic. Conjunctivae pink. Oral mucosa moist.  Neck is supple. No JVD.  RESPIRATORY: Bilaterally clear and equal air entry.  CARDIOVASCULAR: S1, S2 present, regular. No murmur.  ABDOMEN: Soft, nontender. Bowel sounds present. Appears bloated. No fluid, thrill or dullness present.  SKIN: No rashes.  JOINTS: No swelling or tenderness.  NEUROLOGICAL: Power 5 out of 5. Follows commands. Mild tremor present on limbs.  PSYCHIATRIC: Appears slightly anxious, but  otherwise cooperative and no other distress.   LABORATORY RESULTS: Glucose 97, BUN 10, creatinine 0.84, sodium 130, potassium 3.3, chloride 94, CO2 21, calcium 7.8. Ammonia 38. Lipase 517. Ethanol level 192. Albumin is 3.3, bilirubin 3.4, alkaline phosphatase 200, SGOT 262 and SGPT 91. Troponin less than 0.02. TSH 2.14. Urine for toxicology is negative. WBC 3.3, hemoglobin 10.5 and platelet count 51. INR 1.2, prothrombin time 14.9. Urinalysis is grossly negative.   IMAGING: CT chest, abdomen and pelvis shows finding of right lower quadrant which may represent focal colitis involving cecum; alternatively, appendicitis, if clinically appropriate, cannot be excluded, correlate with prior appendectomy is recommended. Consistent with history of cirrhosis. Urinary bladder may represent cystitis, correlate with urinalysis is recommended. Mild fat-containing right inguinal hernia is appreciated. Chest, PA and lateral, shows no acute cardiopulmonary disease, hyperinflation consistent with COPD.   ASSESSMENT AND PLAN: A 69 year old male with chronic alcoholism and history of alcoholic cirrhosis and esophageal varices, who was trying to cut down alcohol for the last few days, who came with abdominal pain, vomiting and black-colored stool for the last 3 to 4 days. CT of the abdomen confirmed colitis, and the patient is having mild shaking with elevated alcohol level in the blood. Last drink was today morning.   1. Colitis. Will give him IV Cipro and Flagyl and will do blood culture.  2. Black-colored stool and vomiting, most likely it is alcoholic gastritis, but because he has a history of alcoholic cirrhosis and esophageal varices, will also check his stool for guaiac and will give him Protonix IV b.i.d. and will follow CBC tomorrow.  3. Alcohol withdrawal. Will monitor him on CIWA protocol.  4. Liver cirrhosis. Will continue monitoring his liver function while he is in the hospital.  5. Hypokalemia, possibly this  is due to hypomagnesemia secondary to alcohol. Will give him magnesium supplement and potassium supplement as needed.   CODE STATUS: Full code, confirmed with the patient and his wife, who is present in the room, and they agreed for that. The patient was under hospice care with full code in the past, 2 years ago.   TOTAL TIME SPENT ON THIS ADMISSION: 50 minutes.   ____________________________ Hope Pigeon Elisabeth Pigeon, MD vgv:OSi D: 08/04/2013 12:23:39 ET T: 08/04/2013 12:55:21 ET JOB#: [id number]  cc: Hope Pigeon. Elisabeth Pigeon, MD, <Dictator> Altamese Dilling MD ELECTRONICALLY SIGNED 08/05/2013 18:39

## 2015-03-02 NOTE — Consult Note (Signed)
Brief Consult Note: Diagnosis: Alcohol dependence, substance induced mood disorder.   Patient was seen by consultant.   Consult note dictated.   Recommend further assessment or treatment.   Comments: Ms. Jeff Preston is an alcoholic. He declines residemntial treatment but agrees to IOP. Dr. Maisie Fushomas will assess the patient.  PLAN: 1. Please continue CIWA.  2. IOP consult.  3. Psychiatry will follow up.  Electronic Signatures: Kristine LineaPucilowska, Galan Ghee (MD)  (Signed 26-Sep-14 16:36)  Authored: Brief Consult Note   Last Updated: 26-Sep-14 16:36 by Kristine LineaPucilowska, Bana Borgmeyer (MD)

## 2015-03-02 NOTE — Consult Note (Signed)
Consult: treatment recommendations Dr. Bary Leriche requested consult for patient to be seen to assist with lifetime abstinence based treatment at this inpatient discharge. Met with patient with wife present and he expressed desires of abstinence of alcoholic beverage lifetime.  met with patient in order to orient him to the [facility] CD-IOP however patient was seen as disoriented therefore oriented patient?s wife to the [facility] and set an appointment for session Monday August 08, 2013 at [place]. Nurse informed of patient level of distress as well as plans for him to attend CD-IOP at this discharge.    Electronic Signatures: Laqueta Due (PsyD)  (Signed on 26-Sep-14 22:24)  Authored  Last Updated: 26-Sep-14 22:24 by Laqueta Due (PsyD)

## 2015-03-02 NOTE — Consult Note (Signed)
Brief Consult Note: Diagnosis: recurrent alcohol abuse, abnormal ct scan. alcohol withdrawl.   Patient was seen by consultant.   Consult note dictated.   Recommend further assessment or treatment.   Comments: Please see full GI consult [phone]#[id number].  Patietn admitted with possible colitis on CT affecting the cecum, etoh withdrawl, recent n/v.  Patietn stated to this examiner that emesis was "clear", although has seen some black stools a week or 2 ago.  Stool currently brown.  No abdominal pain.  LFTs c/w etoh abuse, patient also showing mild pancreatitis/increased lipase.  Marked thrombocytopenia.  Currently hemodynamically stable, no evidence of active GI bleeding or abdominal pain.  Agree with luminal evaluation however currently in etoh withdrawl.  Hopefully platelets can recover adequately without plt tfx to allow luminal evaluation when clinically feasible. Following.  Electronic Signatures: Barnetta ChapelSkulskie, Beyonka Pitney (MD)  (Signed 27-Sep-14 17:17)  Authored: Brief Consult Note   Last Updated: 27-Sep-14 17:17 by Barnetta ChapelSkulskie, Merrianne Mccumbers (MD)

## 2015-03-02 NOTE — Consult Note (Signed)
Chief Complaint:  Subjective/Chief Complaint seen for abnormal ct/possible colitis.  no bm overnight, no abdominal apin, no n/v. feeling better today, more alert.   VITAL SIGNS/ANCILLARY NOTES: **Vital Signs.:   28-Sep-14 05:04  Vital Signs Type Routine  Temperature Temperature (F) 98.1  Celsius 36.7  Pulse Pulse 85  Respirations Respirations 17  Systolic BP Systolic BP 427  Diastolic BP (mmHg) Diastolic BP (mmHg) 67  Mean BP 88  Pulse Ox % Pulse Ox % 96  Pulse Ox Activity Level  At rest  Oxygen Delivery Room Air/ 21 %    13:06  Pulse Pulse 70  Telemetry pattern Cardiac Rhythm Normal sinus rhythm   Brief Assessment:  Cardiac Regular   Respiratory clear BS   Gastrointestinal details normal Nontender  Nondistended  soft protuberant, no fluid wave, unable to palpate internal organs, bs positive   Lab Results: Routine Chem:  28-Sep-14 04:39   Glucose, Serum 97  BUN 7  Creatinine (comp) 0.90  Sodium, Serum  133  Potassium, Serum 3.7  Chloride, Serum 102  CO2, Serum 26  Calcium (Total), Serum  7.2  Anion Gap  5  Osmolality (calc) 264  eGFR (African American) >60  eGFR (Non-African American) >60 (eGFR values <4m/min/1.73 m2 may be an indication of chronic kidney disease (CKD). Calculated eGFR is useful in patients with stable renal function. The eGFR calculation will not be reliable in acutely ill patients when serum creatinine is changing rapidly. It is not useful in  patients on dialysis. The eGFR calculation may not be applicable to patients at the low and high extremes of body sizes, pregnant women, and vegetarians.)  Routine Hem:  25-Sep-14 07:13   Hemoglobin (CBC)  10.5  Platelet Count (CBC)  51 (Result(s) reported on 04 Aug 2013 at 07:34AM.)  26-Sep-14 06:14   Hemoglobin (CBC)  10.1  Platelet Count (CBC)  47  28-Sep-14 04:39   WBC (CBC)  2.2  RBC (CBC)  3.44  Hemoglobin (CBC)  10.2  Hematocrit (CBC)  30.3  Platelet Count (CBC)  54  MCV 88  MCH 29.7   MCHC 33.6  RDW  21.8  Neutrophil % 55.6  Lymphocyte % 24.1  Monocyte % 17.2  Eosinophil % 1.8  Basophil % 1.3  Neutrophil #  1.2  Lymphocyte #  0.5  Monocyte # 0.4  Eosinophil # 0.0  Basophil # 0.0 (Result(s) reported on 07 Aug 2013 at 05:32AM.)   Radiology Results: CT:    25-Sep-14 09:31, CT Chest, Abd, and Pelvis With Contrast  CT Chest, Abd, and Pelvis With Contrast   REASON FOR EXAM:    (1) chest pain; (2) abdominal pain, elevated lipase  COMMENTS:       PROCEDURE: CT  - CT CHEST ABDOMEN AND PELVIS W  - Aug 04 2013  9:31AM     RESULT: CT chest abdomen pelvis dated 08/04/2013    Technique: Helical 3 mm sections were obtained from the thoracic inlet   through the pubic symphysis status post intravenous ministration of 100   mL of Isovue-370.    Findings: The thoracic inlet is unremarkable. Small 3-5 mm sized lymph   nodes identified within the mediastinum. Thereis no evidence of   mediastinal masses. The lung parenchyma demonstrates no evidence of focal   infiltrates, effusions or edema.  There is diffuse thickening of the distal esophageal wall. A small hiatal   hernia is identified containing mesenteric fat.    The liver demonstrates a diffuse low attenuating architecture. Patient  status post cholecystectomy. The liver appears atrophic and demonstrates   a nodular border. The spleen is enlarged at 16.9 cm and varices are   appreciated within the splenic hilum. The adrenals, kidneys are   unremarkable. The pancreas is unremarkable. There is no evidence of   peripancreatic fluid collections, inflammatory change, nor free fluid.     There is a small amount of fluid surrounding the distal tip of thececum.   On delayed images the cecal wall is dilated. The appendix is not clearly   appreciated. There is no evidence of bowel obstruction nor drainable   loculated fluid collections.  Evaluation of the pelvis demonstrates a small amount of free fluid.   Urinary bladder  wall is thickened. Graph there is no evidence of   abdominal aortic aneurysm. The celiac, SMA, IMA, portal vein are   opacified.    IMPRESSION:  Findings within the right lower quadrant which may represent   focal colitis involving cecum. Alternatively appendicitis if clinically   appropriate cannot be excluded. Correlation with prior appendectomy is   recommended. Etiologies include infectious or inflammatory colitis.  2. Findings consistent patient's history of cirrhosis  3.Findings within the urinary bladder may represent cystitis correlation   with urinalysis is recommended.  4. A mild fat containing right inguinal hernia is appreciated.      Verified By: Mikki Santee, M.D., MD   Assessment/Plan:  Assessment/Plan:  Assessment 1) etoh abuse/etoh withdrawl.  stable 2) abnormal ct, no GI symptoms.   Plan 1) will consider colonoscopy once beyond withdrawl.  Paitent with marked thrombocytopenia and a mild/minimal coagulopathy (elevated INR).  Hopefully d/c of etoh will allow some rebound of platelets soon to allow proceedure without plt tfx. Following.   Electronic Signatures: Loistine Simas (MD)  (Signed 28-Sep-14 13:28)  Authored: Chief Complaint, VITAL SIGNS/ANCILLARY NOTES, Brief Assessment, Lab Results, Radiology Results, Assessment/Plan   Last Updated: 28-Sep-14 13:28 by Loistine Simas (MD)

## 2015-03-02 NOTE — Consult Note (Signed)
PATIENT NAME:  Jeff Preston, Jeff Preston MR#:  [id number] DATE OF BIRTH:  [birth year]  DATE OF CONSULTATION:  08/05/2013  REFERRING PHYSICIAN:  Hope Pigeon. Elisabeth Pigeon, MD CONSULTING PHYSICIAN:  Xiomara Sevillano B. Sham Alviar, MD  REASON FOR CONSULTATION: To evaluate a patient with alcoholism and long-standing depression.   IDENTIFYING DATA: Jeff Preston is a 69 year old man with history of alcoholism.   CHIEF COMPLAINT: "I need to stop."   HISTORY OF PRESENT ILLNESS: Jeff Preston was admitted to medical floor for vomiting and dark stools. He has a history of alcoholic liver cirrhosis and was admitted to the hospital before in similar circumstances. He has been drinking continuously and reports only 1 year of sobriety 20 or so years ago. He does not drink every day, but every couple of weeks he has a drinking spell lasting 2, 3, 4 weeks. He drinks beer. He consumes most likely a case of beer a day, even though he lists 12 to 16 beers every day. He does not understand why he relapses on alcohol. He  believes that depression that leads to drinking. He has been tried on antidepressants in the past, but nothing really worked for him. He would like to stop and is open to substance abuse treatment; however, he does not want to go to rehab to be away from his wife for 3 or 4 weeks necessary to complete the program. He is open to outpatient treatment. He reports poor sleep, decreased appetite, anhedonia, feeling of guilt, hopelessness, worthlessness, crying spells, poor memory and concentration, social isolation. He denies suicidal ideation or thoughts of hurting others. He denies psychotic symptoms or symptoms suggestive of bipolar mania. He denies, other than alcohol, illicit substance or prescription pill abuse.  PAST PSYCHIATRIC HISTORY: Except for a few attempts to treat depression in an outpatient setting, denies any prior history. There were no suicide attempts.   FAMILY PSYCHIATRIC HISTORY: Multiple family members with  alcoholism, his parents as well as his brothers. Some of them take medication for depression and anxiety, but the patient is unsure what kind of medicine.   PAST MEDICAL HISTORY: Alcoholic liver disease, esophageal varices.  ALLERGIES: DILANTIN, PENICILLIN.  MEDICATIONS ON ADMISSION: Omeprazole 20 mg twice daily, nadolol 20 mg daily, lorazepam 0.5 mg 3 times daily.   SOCIAL HISTORY: He lives with his wife. He is retired. He had several jobs during his lifetime. The wife is very supportive.   REVIEW OF SYSTEMS:    CONSTITUTIONAL: No fevers or chills. No weight changes. Positive for fatigue.  EYES: No double or blurred vision.  ENT: No hearing loss.  RESPIRATORY: No shortness of breath or cough.  CARDIOVASCULAR: No chest pain or orthopnea.  GASTROINTESTINAL: No abdominal pain. Positive for vomiting and black stools on admission.  GENITOURINARY: No incontinence or frequency.  ENDOCRINE: No heat or cold intolerance.  LYMPHATIC: No anemia or easy bruising.  INTEGUMENTARY: No acne or rash.  MUSCULOSKELETAL: No muscle or joint pain.  NEUROLOGIC: No tingling or weakness.  PSYCHIATRIC: See history of present illness for details.   PHYSICAL EXAMINATION: VITAL SIGNS: Blood pressure 153/85, pulse 73, respirations 18, temperature 98.4.  GENERAL: This is an obese male in no acute distress.   The rest of the physical examination is deferred to his primary attending.   LABORATORY DATA: Chemistries within normal limits except for sodium of 130, potassium 3.3. Blood alcohol level on admission 0.192. Lipase 517. Ammonia 38. LFTs: Total protein 6.7, albumin 3, total bilirubin 3.3, alkaline phosphatase 180, AST 261, AST 84. TSH  3.29. Urine tox screen is negative for substances. CBC: Pancytopenia. Urinalysis is not suggestive of urinary tract infection.   EKG: Normal sinus rhythm with sinus arrhythmia, normal EKG.   MENTAL STATUS EXAMINATION: The patient is alert and oriented to person, place, time and  situation. He is pleasant, polite and cooperative. There is psychomotor retardation. He maintains good eye contact. His speech is slow. Mood is depressed with flat affect. Thought process is logical and goal oriented. Thought content: He denies suicidal or homicidal ideation. There are no  delusions or paranoia. There are no auditory or visual hallucinations. His cognition is grossly intact. He registers 3 out of 3 and recalls 3 out of 3 objects after 5 minutes. He can  spell "world" forwards and backwards. He knows the current president. His insight and judgment are questionable.   DIAGNOSES: AXIS I: Alcohol dependence.  AXIS II: Deferred.  AXIS III: Alcoholic liver disease, hypersplenism, esophageal varices, gastrointestinal bleed. AXIS IV: Mental and physical illness, substance abuse.  AXIS V: Global Assessment of Functioning: 55.   PLAN: 1.  Alcohol detox: Please continue CIWA. 2.  Substance abuse treatment: The patient will consider IOP program. I asked Dr. Huel Coteichard Thomas to evaluate this patient today.  3.  Depression: In the past. Dr. Guss Bundehalla during her consultation several years ago recommended Celexa. The patient did not feel that it was helpful. I will discuss medication with the patient further, depending whether or not he will go to IOP. I supervise IOP program, so we could introduce medications later. Depression is probably the least of his worries at the moment.  4.  Psychiatry will follow up.   ____________________________ Ellin GoodieJolanta B. Dorrine Montone, MD jbp:jm D: 08/05/2013 19:08:53 ET T: 08/05/2013 19:54:40 ET JOB#: [id number]  cc: Keefe Zawistowski B. Jennet MaduroPucilowska, MD, <Dictator> Shari ProwsJOLANTA B Waldine Zenz MD ELECTRONICALLY SIGNED 08/18/2013 7:44

## 2015-03-02 NOTE — Discharge Summary (Signed)
PATIENT NAME:  Jeff Preston, Jeff Preston MR#:  [id number] DATE OF BIRTH:  [birth year]  DATE OF ADMISSION:  08/04/2013 DATE OF DISCHARGE:  08/09/2013  CONSULTANTS:   1.  Dr. Bary Leriche from psychiatry. 2.  Dr. Gustavo Lah from GI.  3.  Laqueta Due from substance abuse program.   CHIEF COMPLAINT:  Vomiting, dark stool, with abdominal pain.   PRIMARY GASTROENTEROLOGIST:  Dr. Vira Agar.   PRIMARY CARE PHYSICIAN:  [facility].   DISCHARGE DIAGNOSES:  1.  Abdominal pain, possibly acute colitis, with possible mild pancreatitis.  2.  Black-colored stool and vomiting, possibly gastrointestinal bleed, upper.  3.  History of alcoholic cirrhosis and esophageal varices.  4.  Alcohol withdrawal.  5.  History of liver cirrhosis.  6.  Hypokalemia.  7.  Alcohol intoxication.  8.  History of pancytopenia.  9.  Chronic alcoholic.  10.  History of hemorrhoids.   DISCHARGE MEDICATIONS:  Nadolol 20 mg daily, omeprazole 20 mg daily, Lorazepam 0.5 mg 3 times a day as needed for anxiety or nervousness.   DIET:  Low sodium, GI, soft.   ACTIVITY:  As tolerated.   DISCHARGE INSTRUCTIONS:  Please follow with PCP within 1 to 2 weeks. Please follow with Dr. Vira Agar within 1 to 2 weeks.   DISPOSITION:  Home.   SIGNIFICANT LABORATORIES AND IMAGING:  Stool guaiac was positive on September 28th. UA did not suggest infection. Blood cultures no growth to date. Initial INR was 1.2. Initial WBC 3.3, hemoglobin 10.5, platelets of 51; lowest WBC was 1.9, last WBC of 2.4 today; last platelet count of 81. U-tox negative on arrival. Initial troponin was negative. TSH was 2.14. Initial bilirubin is 3.4, alk phos 200, AST 262, ALT is 91. Initial alcohol level was 0.192. Initial lipase was 517, next one was 558. Initial BUN 10, creatinine 0.84, sodium 130, potassium 3.3. CT abdomen, chest, and pelvis with contrast showed right lower quadrant findings, which may represent focal colitis involving the cecum; appendicitis is not  excluded; also cirrhosis.   HISTORY OF PRESENT ILLNESS AND HOSPITAL COURSE:  For full details of H and P, please see the dictation on September 25th by Dr. Anselm Preston, but briefly this is a 69 year old with chronic alcoholism, liver cirrhosis, and history of variceal and GI bleed in the past, who has been trying to cut down on alcohol, came in anxious with episodes of abdominal epigastric and chest pain on and off, was admitted to the hospitalist service. He has also had complaint of some black stools without any fevers or chills. A CAT scan was done in the ER showing possible colitis, and he was started on antibiotics, Cipro, Flagyl, admitted to the hospitalist service, some fluids, and a GI consult. In regards to his colitis, he was maintained on Cipro, Flagyl. Blood cultures were sent, which have been negative. He was on that for five days and was taken off of it on the 29th. His abdominal symptoms have significantly improved. The patient was also noted to have mild pancreatitis, which could have been contributing to some of his abdominal pain, in addition to the possible alcoholic gastritis. His diet was slowly advanced, and he has been tolerating the diet. His IV fluid has been stopped. The mild pancreatitis was also likely secondary to alcohol.   In regards to his black-colored stool, he did have positive guaiac, and he was seen by Dr. Gustavo Lah, but at this point he did have pancytopenia with drop in the platelets, as well as white blood  cell count, and per GI, colonoscopy could be deferred and done as an outpatient if needed. His hemoglobin has been stable, and he has had no black stools any longer. He was on PPI b.i.d., and he will be discharged on omeprazole. In regards to his alcohol abuse, he did have some withdrawal, so he was placed on CIWA, was also seen by psychiatry, as well as substance abuse counselor, and information has been given. He is not in withdrawals anymore and is not in DTs, and at this  point, he is very much looking forward to stopping his alcohol use. For his anxiety, he will be discharged on his outpatient benzo. He states he has been having some lower extremity edema, as well as some abdominal fullness, and did mention that now that he has been rehydrated and he feels better he should talk to Dr. Vira Agar, his outpatient GI physician, to see if he is a candidate for diuretics. He does not have any significant abdominal tenderness currently.   PHYSICAL EXAMINATION:  VITAL SIGNS:  On the day of discharge, his temperature is 99, pulse is 71, respiratory rate 18, blood pressure 147/80, O2 sat 98%.  GENERAL:  The patient is a well-developed obese male sitting on a chair. No obvious distress. No tremors.  CARDIOVASCULAR:  S1S2 No significant murmurs.  LUNGS:  Clear.  ABDOMEN:  Obese, nontender, slightly distended.  MUSCULOSKELETAL:  He has 1+ pitting edema on his lower extremities.   DISCHARGE CONDITION:  At this point, he will be discharged with outpatient followup. He is FULL CODE.   TOTAL TIME SPENT:  40 minutes.     ____________________________ Jeff Presto, MD sa:ms D: 08/09/2013 70:14:10 ET T: 08/09/2013 18:36:03 ET JOB#: [id number]  cc: Jeff Presto, MD, <Dictator> Gilbert MD ELECTRONICALLY SIGNED 08/25/2013 14:12

## 2015-03-03 NOTE — Consult Note (Signed)
Pt seen and examined. Please see Dawn Harrison's notes. Even with 20 L of ascitic fluid drained, patient still has ascites. No peripheral edema. Agree with lasix/aldactone. Will likely need higher doses later, ex. lasix 40mg  daily and aldactone 100mg  daily. Since, patient had not been taking diuretics for a long time, reasonable to start with lower doses 1st and decide changing doses later. Consider adding low dose lactulose to prevent hepatic encephalopathy. Continue with nadolol. Agree that patient will need colonoscopy and repeat EGD later. Not urgent. Can be arranged as outpt. Pt needs to follow up with Dr. Mechele CollinElliott on discharge. If patient requires long psych hospitalization, then will check on patient periodically. For now, continue low salt intake and fluid restriction. Thanks.  Electronic Signatures: Lutricia Feilh, Deleon Passe (MD)  (Signed on 21-Jul-15 07:28)  Authored  Last Updated: 21-Jul-15 07:28 by Lutricia Feilh, Toba Claudio (MD)

## 2015-03-03 NOTE — Consult Note (Signed)
PATIENT NAME:  Jeff, Preston MR#:  [id number] DATE OF BIRTH:  [birth year]  DATE OF CONSULTATION:  05/29/2014  REFERRING PHYSICIAN:  Dr. Weber Preston CONSULTING PHYSICIAN: Verdie Shire, MD / Payton Emerald, NP  REASON FOR CONSULTATION: Ascites.   HISTORY OF PRESENT ILLNESS: [name] is a 69 year old Caucasian gentleman who has a known history of depression and anxiety as well as cirrhosis secondary to alcoholic cirrhosis, ascites as well as history of alcoholism with continued alcohol abuse. Up until a month ago states he was drinking a 12 pack a day. His primary gastroenterologist is Dr. Gaylyn Cheers. States he has not seen him this year. In reviewing his medications he is supposed to be taking Aldactone 50 mg once a day as well as furosemide 20 mg a day as well as nadolol 20 mg once a day as he has a history as well of esophageal varices. He had an EGD by Dr. Vira Agar that was done on 06/19/2011 which revealed grade 3 esophageal varices with banding being done with incompletely deflating the varices. Clotted blood noted in the stomach. Blood in the duodenal bulb of the second part of the duodenum. In review of the EMR, it does not appear that he has had a colonoscopy performed in the past. The patient is noncompliant with his medications, states that he does not adhere to taking them on a regular basis. In fact he has had none of his medicines for 2 days prior to presenting to the Emergency Room. He presented with the statement that I am suicidal. His son resides with him. The patient did have an ultrasound guided large volume paracentesis yesterday and in fact 20 liters was removed. It does not appear that he received albumin after having it removed. He does state that he is feeling better since the fluid is removed. He feels that this is probably the third or fourth time in which he has had to have a paracentesis done.   The patient has been experiencing abdominal pain with increase in abdominal girth. He  feels that the increase in the size of his abdomen has been present progressively over the past 2 months. Denies any edema to lower extremities. No nausea. No vomiting. Bowels move on average every day. It does not appear that he is taking lactulose or Xifaxan in review of his medicines. Does not adhere to recommended diet.   In reviewing H and P on admission, he does not identify any new stressors, except recent changes in his medicine. Apparently Ativan was discontinued and substituted with BuSpar a few weeks prior to admission. The patient was thinking that BuSpar can make him suicidal as he does not have any history of suicide in the past or ideations. Feelings of depression with poor sleep, extremely poor appetite, feeling of guilt, hopelessness, worthlessness, poor memory and concentration, low energy, social isolation. Suicidal ideations have only been present for the past 2 to 3 days.   PAST MEDICAL HISTORY: Depression and anxiety and alcoholic cirrhosis with esophageal varices.   PAST SURGICAL HISTORY: EGD in 2012.   FAMILY HISTORY: Multiple family members with alcoholism, depression and anxiety.   SOCIAL HISTORY: Was drinking up to a 12 pack a day up until a month ago. No tobacco. No recreational drug use.   ALLERGIES: DILANTIN and PENICILLIN.   HOME MEDICATIONS: Aldactone 20 mg once a day, promethazine 25 mg every 6 hours as needed, BuSpar 10 mg twice a day, nadolol 20 mg daily, furosemide 20 mg a day,  simethicone 80 mg with meals and before bedtime, omeprazole 20 mg a day, ciprofloxacin 500 mg daily. Again, the patient is nonadherent with his medications.   REVIEW OF SYSTEMS:  CONSTITUTIONAL: No fevers. No chills. Significant for weight gain.  EYES: No blurred vision, double vision.  HEENT: No hearing loss.  RESPIRATORY: Significant for some dyspnea. No adventitious sounds such as complaints of wheezing.  CARDIOVASCULAR: No chest pain, heart palpitations.  GASTROINTESTINAL: See  HPI. GENITOURINARY: Denies any dysuria or hematuria.  ENDOCRINE: No heat or cold intolerance. LYMPHATIC: Denies significant easy bruising and bleeding.  INTEGUMENTARY: No rashes. No lesions.  MUSCULOSKELETAL: No neuralgias, myalgias.  NEUROLOGIC: No tingling. No weakness.  PSYCHIATRIC: Significant for depression and anxiety.   PHYSICAL EXAMINATION: VITAL SIGNS: Temperature 98, pulse 80, respirations 20, blood pressure 119/70.  GENERAL: Well-developed, disheveled 69 year old Caucasian gentleman who appears to be resting comfortably in bed.  HEENT: Normocephalic, atraumatic. Pupils equal and reactive to light. Conjunctivae clear. Sclerae anicteric.  NECK: Supple. Trachea midline. No lymphadenopathy or thyromegaly.  LUNGS: Symmetric rise and fall of chest. Clear to auscultation throughout.  HEART: Regular rate and rhythm, S1 and S2. No murmurs. No gallops.  ABDOMEN: Large, not distended but does appear still evidence of ascites despite large volume paracentesis. Evidence of hepatosplenomegaly. No masses. Bowel sounds present in all 4 quadrants.  RECTAL: Deferred.  MUSCULOSKELETAL: Movement of all 4 extremities. No contractures. No clubbing.  EXTREMITIES: No edema.  SKIN: No lesions. No rashes. Color pale.  NEUROLOGIC: No gross neurological deficits.   DIAGNOSTIC DATA: Laboratory studies reviewed during hospitalization, on July 16th, glucose was 100, sodium was 139 and potassium was 3.1. EGFR is 60. Calcium is 7.6. Today chemistry panel: Glucose is 121. Calcium has dropped to 7.2. Ethanol level is less than 3. Hepatic panel: Albumin 2.1 on admission and currently 1.8. Total bilirubin was 1.1 on admission and currently 1.3. Alkaline phosphatase 160, currently 132. AST was 56 on admission and 54 range today. ALT has remained within normal range, between 23 to 18. Urine drug screen is unremarkable. CBC on admission: Hemoglobin was 10.8 with hematocrit of 33. RBC was 3.63. RDW was 14.9 with a WBC  count of 6.4. Acetaminophen level less than 2 and salicylate serum level less than 1.7.   Ultrasound-guided paracentesis again was performed yesterday. States that it was performed without any difficulty. A total of approximately 20 liters of clear fluid was removed.   IMPRESSION: 1.  Depression and anxiety. 2.  Alcoholic cirrhosis with ascites.  3.  Possible element of hepatic encephalopathy.  4.  Nonadherence with medication therapy.  5.  Known history of esophageal varices.  PLAN: The patient's presentation was discussed with Dr. Verdie Shire. The patient should remain with Lasix 20 mg once a day as well as Aldactone 50 mg a day. Nadolol should also be continued as 20 mg daily. Do recommend though that consideration of lactulose being started with 10 grams/15 mL with a dose of 30 mL twice a day. Given his history of cirrhosis, per guidelines, he should be having lactulose titrated to 3 to 4 bowel movements a day. He would also benefit from being on Xifaxan 550 mg p.o. twice a day. He needs to adhere to a low sodium diet and continued therapy for polysubstance abuse. We will continue to monitor.   These services provided by Payton Emerald, MS, APRN, [facility], FNP under collaborative agreement with Verdie Shire, MD.  ____________________________ Payton Emerald, NP dsh:sb D: 05/29/2014 15:51:54 ET T: 05/29/2014  16:20:22 ET JOB#: [id number]  cc: Payton Emerald, NP, <Dictator> Payton Emerald MD ELECTRONICALLY SIGNED 05/30/2014 9:44

## 2015-03-03 NOTE — Consult Note (Signed)
Brief Consult Note: Diagnosis: Major depressive disorder recurrent severe, alcoholic liver disease.   Patient was seen by consultant.   Consult note dictated.   Recommend further assessment or treatment.   Orders entered.   Comments: [name] has a h/o depression and alcoholism. He has not been drinking lately but became increasingly depressed and now suicidal after his wife left in January.  PLAN: 1. The patient needs admission to psychiatry for safety and medication managment. No beds available in psychiatry. Will try to admit tomorrow when bed available.  2. I restarted his medications.  3. Psychiatry will follow up.  Electronic Signatures: Kristine LineaPucilowska, Yonas Bunda (MD)  (Signed 16-Jul-15 15:20)  Authored: Brief Consult Note   Last Updated: 16-Jul-15 15:20 by Kristine LineaPucilowska, Naidelin Gugliotta (MD)

## 2015-03-03 NOTE — Consult Note (Signed)
PATIENT NAME:  Jeff Preston, Jeff Preston MR#:  [id number] DATE OF BIRTH:  1946-06-16  DATE OF CONSULTATION:  05/26/2014  REFERRING PHYSICIAN:  Dr. Karie Soda CONSULTING PHYSICIAN:  Hope Pigeon. Elisabeth Pigeon, MD  REASON FOR CONSULT: Liver cirrhosis and ascites.  HISTORY OF PRESENTING ILLNESS: A 69 year old male with past history of chronic alcoholism and liver cirrhosis with recurrent ascites and esophageal varices, and history of hemorrhoids. For the last few months, he had been drinking a lot, and then started having depression.  He started having suicidal thoughts and so came in voluntarily to be treated for that and admitted to psychiatric services for treatment of suicidal thoughts and depression.  The patient has a history of liver cirrhosis and ascites, recurrent ascitic tap was done.  In July, he came to the Emergency Room, ascitic tap was done and sent home.  That was July 12, five days ago.  I do not see any other notes, but it was done by Dr. Margarita Grizzle in the Emergency Room.  How much fluid was removed?  I am not able to get those details.  Then the next day, on July 13, he came back because there was some leaking from his ascitic tap site, which was stopped by doing some stitching by ER physician and sent home again.  During my examination with the patient in his Behavioral Medicine Unit, he said that he is feeling somewhat nauseous, but after getting Phenergan by nurse, feeling a little better.  Feeling some anxious, but denies any abdominal pain. He feels that his abdomen is distended, but is not painful or he does not have fever, and he denies any shaking or any other complaints like diarrhea or constipation.  REVIEW OF SYSTEMS:  CONSTITUTIONAL: Negative for fever, fatigue, weakness, pain, or weight loss.  EYES: No blurring, double vision, discharge, or redness.  EARS, NOSE, THROAT: No tinnitus, ear pain, or hearing loss.  RESPIRATORY: No cough, wheezing, hemoptysis, or shortness of breath.   CARDIOVASCULAR: No chest pain, palpitations, edema, or arrhythmia.  GASTROINTESTINAL: The patient has somewhat nausea and some abdominal distention, but no pain, constipation, or diarrhea.  GENITOURINARY:  No increased frequency, urgency, or painful urination.  ENDOCRINE: No heat or cold intolerance.  SKIN: No acne, rashes, or lesions.  MUSCULOSKELETAL: No pain or swelling in the joints.  NEUROLOGICAL: No numbness, weakness, tremor, or vertigo.  PSYCHIATRIC: No anxiety, insomnia, or bipolar disorder, but has depression and suicidal thoughts.   PAST MEDICAL HISTORY:  1.  Chronic alcoholism, but says that the last drink was almost a month ago.  2.  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  3.  Esophageal varices and bleed in the past.  4.  History of hemorrhoid.   SOCIAL HISTORY: He was living at home with his wife and wife left him in January. He is a heavy drinker, but did not drink for the last few weeks. No smoking or no drug use.   PAST SURGICAL HISTORY: Cholecystectomy.   FAMILY HISTORY: Positive for heart disease, dementia, myocardial infarction in mother. Diabetes also runs in the family.   HOME MEDICATIONS: 1.  Spironolactone 50 mg once a day.  2.  Promethazine 25 mg oral take half to 1 tablet every 6 hours.  3.  Omeprazole 20 mg 2 times a day.  4.  Nadolol 20 mg once a day.  5.  Furosemide 20 mg oral once a day.  6.  Ciprofloxacin 500 mg oral tablet once a day.  7.  Buspirone 10 mg oral 2 times a day.   PHYSICAL EXAMINATION:  VITAL SIGNS: Currently temperature 98, respirations 20, blood pressure 136/85, pulse oximetry is 100% on room air, and pulse rate is 79.  GENERAL: The patient is fully alert and oriented to time, place, and person; does not appear in any acute distress.  HEENT:  Head and neck atraumatic. Conjunctivae pink. Oral mucosa moist.  NECK: Supple. No JVD.  RESPIRATORY: Bilateral equal and clear air entry.   CARDIOVASCULAR: S1, S2 present, regular. No murmur.  ABDOMEN: Soft, distended bowel sounds present. No organomegaly felt. Globe-shaped abdomen.  SKIN: No acne or rashes.  MUSCULOSKELETAL: No pain or swelling or tenderness in the joints. LEGS: Edema present in both lower limbs.  NEUROLOGICAL: No tremor or rigidity. Power 5/5. Follows commands. No asterixis on outstretched hand.  Sensations are intact.  PSYCHIATRIC: Appears slightly depressed.   IMPORTANT LABORATORY RESULTS: Glucose 100, BUN 8, creatinine 1.24, sodium 139, potassium is 3.1, chloride is 105, CO2 is 27, calcium is 7.6, ammonia is 14, ethanol level less than 3. Total protein 7.6, albumin 2.1, bilirubin 1.1, alkaline phosphate 160, SGOT 56 and SGPT is 23. Urine for toxicology is negative. WBC 6.4, hemoglobin 10.8, platelet count 176,000, and MCV is 91 Paracentesis was done on July 12, and as per the report, there were 17 neutrophils and 152 total nucleated cells, so it is negative for SBP. Urinalysis is negative.   ASSESSMENT AND PLAN: A 69 year old male who is a chronic alcoholic with liver cirrhosis with recurrent ascites, admitted to psychiatry for treatment of depression and suicidal ideation. Medical consult for liver cirrhosis and ascites issues.   1.  Liver cirrhosis and ascites. This is a chronic issue. Currently, he is not in great discomfort because of his ascites. Does not have abdominal pain or shortness of breath and able to tolerate diet chest, have just mild nausea. Currently, we will monitor and managed it symptomatically with Phenergan or Zofran as needed.  Ascitic tap was done 5 days ago by ER physician Dr. Margarita GrizzleWoodruff.  I could not find how much fluid was removed. There was no finding of SBP on that one, so we will just continue monitoring. He is already on supportive medicines of spironolactone, Lasix, and nadolol.  We will continue that for now. Ammonia level is low, and patient is fully alert and oriented. Advised to avoid  any hepatotoxic medications. I discontinued acetaminophen order, which was p.r.n. If he has fever or pain, we can use some nonsteroidal antiinflammatory drugs on an as needed basis.  We will continue following and, if he need ascitic tap, we will arrange for that.   2.  Hypokalemia. Most likely it is a result of continuous use of Lasix. We will provide him potassium supplements.   3.  Chronic alcoholism. Currently, there are no signs of withdrawal and the patient said his last drink was a few weeks ago, so he might go into withdrawal now, but continue monitoring for any signs of that.   4.  Chronic anemia, most likely this is due to alcoholism and liver issues. His hemoglobin is 10.8, stable. Continue monitoring.   5.  History of esophageal varices.  Currently, there is no bleeding. Continue monitoring. Already on Adderall.   TOTAL TIME SPENT ON THIS CONSULT: 50 minutes.     ____________________________ Hope PigeonVaibhavkumar G. Elisabeth PigeonVachhani, MD vgv:ts D: 05/26/2014 12:20:01 ET T: 05/26/2014 12:45:12 ET JOB#: [id number]  cc: Hope PigeonVaibhavkumar G. Elisabeth PigeonVachhani, MD, <Dictator> Scot Junobert T. [name], MD [facility]UM[facility]ACHHANI  MD ELECTRONICALLY SIGNED 06/06/2014 16:14

## 2015-03-03 NOTE — Consult Note (Signed)
Brief Consult Note: Diagnosis: Alcoholic cirrhosis, ascites.  Esophageal varices. Depression and anxiety.  Non adherence with medication therapy.   Consult note dictated.   Discussed with Attending MD.   Comments: Patient's presentation discussed with Dr. Lutricia FeilPaul Oh. Will continue to monitor at this time.  He needs to adhere to medication therapy as prescribed.  At this time patient should remain on Lasix, Aldactone and Nadolol as prescribed.  He will probably benefit with increasing doses given the amount of fluid removed at the time of paracentesis but this can be decided in the future.  He needs to be on low sodium diet.  Continue to monitor abdominal girth size.  If he warrants paracentesis in the near future he would benefit with receiving Albumin post procedure.  He does warrant EGD given prior findings of Grade III varices and he has not had an EGD done since 2012.  Date to be decided. Also warrants Colonoscopy for CCA screening.  Electronic Signatures: Rodman KeyHarrison, Dawn S (NP)  (Signed 20-Jul-15 15:55)  Authored: Brief Consult Note   Last Updated: 20-Jul-15 15:55 by Rodman KeyHarrison, Dawn S (NP)

## 2015-03-03 NOTE — H&P (Signed)
PATIENT NAME:  Jeff Preston, Jeff Preston MR#:  [id number] DATE OF BIRTH:  1946/04/13  DATE OF ADMISSION:  05/25/2014  REFERRING PHYSICIAN: ER MD   ATTENDING PHYSICIAN:  Jeff Stutz B. Jennet MaduroPucilowska, MD.  IDENTIFYING DATA: Jeff Preston is a 69 year old male with history of depression and alcoholism.   CHIEF COMPLAINT: "I am suicidal."   HISTORY OF PRESENT ILLNESS:  Jeff Preston has a long history of depression and alcoholism. I saw him in consultation a year ago and at that time, he was drinking a case of beer a day. He  claims that he does not drink anymore now. He has seriously advanced alcoholic liver disease with ascites. He has always been an episodic drinker, so it is not impossible that he had other drinking spells since last August. His wife but left him in January and he now lives with his  son. He does not identify new stressors, except for recent change in his medication.  Apparently, Ativan was discontinued and substituted with BuSpar a few weeks ago. The patient thinks that BuSpar could have made him suicidal, as he does not have any history of suicidality. He reports many symptoms of depression with poor sleep, extremely poor appetite, anhedonia, feeling of guilt, hopelessness, worthlessness, poor memory and concentration, low energy, social isolation, and now suicidal ideation for the past 2 or 3 days. This is a completely a new symptom. The patient is rather distraught about it. He denies psychotic symptoms. He reports heightened anxiety, that in the past were taken care of with lorazepam and feels that the BuSpar has not been as helpful. He denies psychotic symptoms, denies symptoms suggestive of bipolar mania. There are no other than alcohol, substance abuse.   PAST PSYCHIATRIC HISTORY: He has a long history of depression and has been tried in the past on several antidepressants including Celexa. He has been to IOP programs, but never participated in a residential rehabilitation. There were no suicide  attempts.   FAMILY PSYCHIATRIC HISTORY: Multiple family members with alcoholism, depression and anxiety.   PAST MEDICAL HISTORY: Alcoholic liver disease with esophageal varices.   ALLERGIES: DILANTIN, PENICILLIN.   MEDICATIONS ON ADMISSION: Aldactone 50 mg daily, promethazine 25 mg every 6 hours as needed, BuSpar 10 mg twice daily, nadolol 20 mg daily, furosemide 20 mg daily, simethicone 80 mg with meals and before bedtime, omeprazole 20 mg daily, Cipro 500 mg daily. It is unclear whether this is still recommended.  SOCIAL HISTORY: Since his wife left him in January, he has been staying in her house with his son. His son works most of the time. The patient is home alone. He has not been able to sleep, has not been able to drink or eat. His wife put the house on the market and moved to another state. He does have Medicare.   REVIEW OF SYSTEMS: CONSTITUTIONAL: No fevers or chills. No weight changes.  EYES: No double or blurred vision.  ENT: No hearing loss. RESPIRATORY: No shortness of breath or cough.  CARDIOVASCULAR: No chest pain or orthopnea.  GASTROINTESTINAL: No abdominal pain, nausea, vomiting, or diarrhea. There is severe abdominal extension from fluid retention.  GENITOURINARY:  No incontinence or frequency.  ENDOCRINE: No heat or cold intolerance.  LYMPHATIC: No anemia or easy bruising.  INTEGUMENTARY: No acne or rash.  MUSCULOSKELETAL: No muscle or joint pain.  NEUROLOGIC: No tingling or weakness.  PSYCHIATRIC: See history of present illness for details.   PHYSICAL EXAMINATION: VITAL SIGNS: Blood pressure 131/69, pulse 74, respirations 20, temperature  98.  GENERAL: This is an emaciated elderly gentleman with enormous belly in no acute distress.  HEENT: The pupils are equal, round, and reactive to light. Sclerae anicteric.  NECK: Supple. No thyromegaly.  LUNGS: Clear to auscultation. No dullness to percussion.  HEART: Regular rhythm and rate. No murmurs, rubs, or gallops.   ABDOMEN: Distended, most likely fluid, nontender. Positive bowel sounds.  MUSCULOSKELETAL: Normal muscle strength in all extremities.  SKIN: No rashes or bruises.  LYMPHATIC: No cervical adenopathy.  NEUROLOGIC: Cranial nerves II-XII are intact.   LABORATORY DATA: Chemistries are within normal limits with potassium 3.1. Blood alcohol level is 0. LFTs: Total protein 7.6, albumin 2.1, bilirubin 1.1, alkaline phosphatase 160, AST 56. Urine toxicology screen is negative for substances. CBC white blood cells 6.4. Hemoglobin 10.8, hematocrit 33, platelets 176,000.  Urinalysis is not suggestive of urinary tract infection. Serum acetaminophen and salicylates are low.   MENTAL STATUS EXAMINATION: The patient is alert and oriented to person, place, time and situation. He is pleasant, polite and cooperative. He is extremely tearful on the interview. He maintains some eye contact. His grooming is questionable. His speech is soft. Mood is depressed with tearful affect. Thought process is logical and goal oriented. He endorses suicidal ideation. There are no delusions or paranoia. There are no auditory or visual hallucinations. His cognition is grossly intact. Registration, recall and long-term memory are intact. He is of average intelligence and fund of knowledge. His insight and judgment are poor.   SUICIDE RISK ASSESSMENT ON ADMISSION: This is a patient with a long history of alcoholism and now depression and mood instability who came to the hospital suicidal.   INITIAL DIAGNOSES:  AXIS I: Major depressive disorder, recurrent severe; alcohol dependence.  AXIS II: Deferred.  AXIS III: Alcoholic liver disease, hypersplenism, esophageal varices, gastroesophageal reflux disease. AXIS IV: Mental and physical illness relationship.  AXIS V: Global assessment of functioning 25.   PLAN: The patient was admitted to [facility] Medicine Unit for safety, stabilization and medication  management. He was initially placed on suicide precautions and was closely monitored for any unsafe behaviors. He underwent full psychiatric and risk assessment. He received pharmacotherapy, individual and group psychotherapy, substance abuse counseling, and support from therapeutic milieu.  1.  Suicidal ideation: The patient is able to contract for safety.  2.  Mood. We will restart Pristiq. Discontinue of BuSpar.  3. Alcoholic liver disease. We will ask medicine for a consultation. We will continue nadolol, Aldactone, furosemide.  Will order ammonia level.   DISPOSITION: Most likely to home, although I wonder if this patient does require placement.     ____________________________ Ellin Goodie. Aiyonna Lucado, MD jbp:ds D: 05/25/2014 15:43:11 ET T: 05/25/2014 16:01:20 ET JOB#: [id number]  cc: Joannah Gitlin B. Jennet Maduro, MD, <Dictator> Shari Prows MD ELECTRONICALLY SIGNED 06/01/2014 7:21

## 2015-03-03 NOTE — Consult Note (Signed)
Brief Consult Note: Diagnosis: Alcoholic liver cirrhosis.   Patient was seen by consultant.   Consult note dictated.   Orders entered.   Comments: Liver cirrhosis, ascites.  No asterexis,  Will continue to follow  supportive management for now.  Electronic Signatures: Altamese DillingVachhani, Reda Citron (MD)  (Signed 17-Jul-15 11:56)  Authored: Brief Consult Note   Last Updated: 17-Jul-15 11:56 by Altamese DillingVachhani, Jamir Rone (MD)

## 2015-03-05 ENCOUNTER — Inpatient Hospital Stay
Admit: 2015-03-05 | Disposition: A | Discharge status: Home / Self Care | Payer: Self-pay | Attending: Internal Medicine | Admitting: Internal Medicine

## 2015-03-05 LAB — URINALYSIS, COMPLETE
Bilirubin,UR: NEGATIVE
Blood: NEGATIVE
Glucose,UR: NEGATIVE mg/dL (ref 0–75)
Ketone: NEGATIVE
Leukocyte Esterase: NEGATIVE
Nitrite: NEGATIVE
PH: 8 (ref 4.5–8.0)
PROTEIN: NEGATIVE
RBC,UR: NONE SEEN /HPF (ref 0–5)
SPECIFIC GRAVITY: 1.002 (ref 1.003–1.030)
Squamous Epithelial: NONE SEEN
WBC UR: NONE SEEN /HPF (ref 0–5)

## 2015-03-05 LAB — COMPREHENSIVE METABOLIC PANEL
AST: 61 U/L — AB
Albumin: 3 g/dL — ABNORMAL LOW
Alkaline Phosphatase: 108 U/L
Anion Gap: 10 (ref 7–16)
BILIRUBIN TOTAL: 1.9 mg/dL — AB
BUN: 7 mg/dL
CHLORIDE: 98 mmol/L — AB
CO2: 26 mmol/L
Calcium, Total: 8 mg/dL — ABNORMAL LOW
Creatinine: 1.04 mg/dL
EGFR (African American): >60
EGFR (Non-African Amer.): >60
GLUCOSE: 96 mg/dL
Potassium: 3.8 mmol/L
SGPT (ALT): 22 U/L
SODIUM: 134 mmol/L — AB
Total Protein: 7.4 g/dL

## 2015-03-05 LAB — BODY FLUID CELL COUNT WITH DIFFERENTIAL
Basophil: 0 %
Basophil: 0 %
EOS PCT: 0 %
Eosinophil: 0 %
Lymphocytes: 56 %
Lymphocytes: 58 %
NUCLEATED CELL COUNT: 228 /mm3
Neutrophils: 3 %
Neutrophils: 4 %
Nucleated Cell Count: 372 /mm3
OTHER CELLS BF: 0 %
OTHER CELLS BF: 0 %
Other Mononuclear Cells: 41 %
Other Mononuclear Cells: 38 %

## 2015-03-05 LAB — CBC WITH DIFFERENTIAL/PLATELET
BASOS ABS: 0 10*3/uL (ref 0.0–0.1)
Basophil %: 0.5 %
EOS ABS: 0.2 10*3/uL (ref 0.0–0.7)
Eosinophil %: 2.9 %
HCT: 31.9 % — AB (ref 40.0–52.0)
HGB: 10.8 g/dL — ABNORMAL LOW (ref 13.0–18.0)
LYMPHS ABS: 1 10*3/uL (ref 1.0–3.6)
LYMPHS PCT: 11.8 %
MCH: 28.6 pg (ref 26.0–34.0)
MCHC: 34 g/dL (ref 32.0–36.0)
MCV: 84 fL (ref 80–100)
MONOS PCT: 10.3 %
Monocyte #: 0.9 x10 3/mm (ref 0.2–1.0)
NEUTROS PCT: 74.5 %
Neutrophil #: 6.5 10*3/uL (ref 1.4–6.5)
PLATELETS: 151 10*3/uL (ref 150–440)
RBC: 3.79 10*6/uL — ABNORMAL LOW (ref 4.40–5.90)
RDW: 15.6 % — ABNORMAL HIGH (ref 11.5–14.5)
WBC: 8.7 10*3/uL (ref 3.8–10.6)

## 2015-03-05 LAB — PROTIME-INR
INR: 1.3
Prothrombin Time: 16.5 secs — ABNORMAL HIGH

## 2015-03-05 LAB — ALBUMIN, FLUID (OTHER)

## 2015-03-05 LAB — LACTIC ACID, PLASMA: Lactic Acid, Venous: 1.8 mmol/L

## 2015-03-05 LAB — LIPASE, BLOOD: LIPASE: 41 U/L

## 2015-03-05 LAB — PROTEIN, BODY FLUID: Protein, Body Fluid: <3 g/dL

## 2015-03-05 LAB — TROPONIN I: Troponin-I: <0.03 ng/mL

## 2015-03-05 LAB — APTT: Activated PTT: 33.9 secs (ref 23.6–35.9)

## 2015-03-05 LAB — GLUCOSE, SEROUS FLUID: GLUCOSE, BODY FLUID: 120 mg/dL

## 2015-03-05 LAB — ETHANOL

## 2015-03-05 LAB — LACTATE DEHYDROGENASE, PLEURAL OR PERITONEAL FLUID: LDH, BODY FLUID: 54 U/L

## 2015-03-05 LAB — AMYLASE, BODY FLUID: AMYLASE, BODY FLUID: 9 U/L

## 2015-03-05 LAB — AMMONIA: AMMONIA, PLASMA: 31 umol/L

## 2015-03-06 LAB — TSH: Thyroid Stimulating Horm: 1.199 u[IU]/mL

## 2015-03-07 NOTE — Discharge Summary (Signed)
PATIENT NAME:  Jeff Preston, Jeff Preston MR#:  [id number] DATE OF BIRTH:  June 24, 1946  DATE OF ADMISSION:  11/05/2014 DATE OF DISCHARGE:  11/07/2014  ADMITTING PHYSICIAN: Katha HammingSnehalatha Konidena, MD  DISCHARGING PHYSICIAN: Enid Baasadhika Dagmawi Venable, MD   PRIMARY CARE PHYSICIAN: Dr. Letta PateAycock   CONSULTATIONS IN THE HOSPITAL: None.   DISCHARGE DIAGNOSES:  1.  Diuretic-refractory ascites, status post 12 L fluid removal by paracentesis.  2.  Alcoholic liver disease.  3.  Liver cirrhosis.  4.  Chronic anemia.   DISCHARGE HOME MEDICATIONS:  1.  Promethazine 25 mg 1/2 tablet to 1 tablet every 6 hours as needed for nausea and vomiting.  2.  Ambien 10 mg p.o. at bedtime for sleep.  3.  Simethicone 80 mg 4 times a day for gas.  4.  Prilosec 20 mg p.o. b.i.d. for acid reflux.  5.  Lorazepam 0.5 mg p.o. 3 times a day.  6.  Klor-Con 20 mEq p.o. at bedtime.  7.  Nadolol 20 mg p.o. daily.  8.  Aldactone 100 mg p.o. daily.  9.  Lasix 40 mg p.o. daily.  10.  Lactulose 30 mL p.o. b.i.d.   DISCHARGE DIET: Low-sodium diet.   DISCHARGE ACTIVITY: As tolerated.    FOLLOWUP INSTRUCTIONS: Follow up with Dr. Mechele CollinElliott in 2 weeks to discuss about outpatient scheduled paracentesis every 4-6 weeks.   LABORATORIES AND IMAGING STUDIES PRIOR TO DISCHARGE:  1.  WBC 4.8, hemoglobin 9.4, hematocrit 28.2, platelet count is 236,000.  2.  Sodium 138, potassium 3.2, chloride 101, bicarbonate 28, BUN 13, creatinine 1.2, glucose 92, and calcium of 7.3.  3.  Ultrasound-guided paracentesis took out about 12 L of ascitic fluid.  4.  Urinalysis negative on admission.  5.  Chest x-ray showing no acute disease.  6.  INR of 1.4.   BRIEF HOSPITAL COURSE: Jeff Preston  is a 69 year old Caucasian male with a past medical history significant for a history of heavy alcohol use, alcoholic liver cirrhosis, history of refractory ascites, who had about 12 L of ascitic fluid taken out in August 2015; comes to the hospital secondary to worsening abdominal  swelling and shortness of breath.  1.  Ascites. Had prior paracentesis done. Again admitted for paracentesis. About 12 L of fluid was drained out. He is on Aldactone and Lasix; doses were increased while in the hospital. The patient was ambulated and is being discharged. He was advised to follow up with Dr. Mechele CollinElliott, to see if he can have scheduled paracentesis every few weeks instead of waiting for a couple of months and taking 12 L out.  He required albumin infusion, as his blood pressure dropped after that paracentesis.   2.  Alcoholic liver cirrhosis. On nadolol, Aldactone, Lasix. Lactulose also added. Not encephalopathic.  3.  Gastroesophageal reflex disease on omeprazole.  4.  His course has been otherwise uneventful in the hospital.   DISCHARGE CONDITION: Stable.   DISCHARGE DISPOSITION: Home.   TIME SPENT ON DISCHARGE: 40 minutes.    ____________________________ Enid Baasadhika Lillyian Heidt, MD rk:MT D: 11/07/2014 13:08:32 ET T: 11/07/2014 15:23:37 ET JOB#: [id number]  cc: Enid Baasadhika Teralyn Mullins, MD, <Dictator> Scot Junobert T. [name], MD Enid BaasADHIKA Abagayle Klutts MD ELECTRONICALLY SIGNED 11/14/2014 15:06

## 2015-03-07 NOTE — H&P (Signed)
PATIENT NAME:  Maia BreslowCARDEN, Zamarian W MR#:  [id number] DATE OF BIRTH:  Jan 05, 1946  DATE OF ADMISSION:  11/05/2014  PRIMARY DOCTOR: Ngwe A. Letta PateAycock, MD  EMERGENCY ROOM PHYSICIAN: Su Leyobert L. Kinner, MD   CHIEF COMPLAINT: Abdominal swelling with shortness of breath and weight gain.   HISTORY OF PRESENT ILLNESS: A 69 year old male patient with history of alcoholic cirrhosis, ascites, comes in today because of abdominal swelling increasing for the past 3 or 4 days, also with trouble breathing. The patient denies any cough or nausea or vomiting. The patient has a history of alcoholic cirrhosis and ascites. His usual weight is 190 pounds and last month he was 190 and today his weight is 210 pounds. The patient takes Lasix and Aldactone and he says that he forgot to take Lasix yesterday, but usually he is very regular with his medications. The patient was admitted in July, from July 16 to 22nd of this year and the patient has history of any heavy alcohol abuse, last was a month ago, but he used to drink 12 to 16 packs of beers daily for many years. The patient denies any swelling of the legs. No pedal edema. No orthopnea and trouble breathing is worse for about 3 to 4 days as well as with increased abdominal girth.   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 History of depression and anxiety.   SOCIAL HISTORY: Lives with the wife, had no smoking. He used to drink heavy, now quit. No drugs.   PAST SURGICAL HISTORY: Cholecystectomy.   FAMILY HISTORY: Positive for heart disease, dementia, MI in the mother.  ALLERGIES: PENICILLIN AND DILANTIN.  HOME MEDICATIONS: Lasix 20 mg p.o. in the morning. KCL 20 mEq p.o. daily, Ativan 0.5 mg p.o. t.i.d., nadolol 20 mg p.o. daily, omeprazole 20 mg p.o. daily, Phenergan 25 mg 1/2 tablet every 6 hours as needed for nausea, simethicone 80 mg p.o. 4 times daily, Aldactone 50 mg p.o.  daily, Ambien 10 mg p.o. daily.   REVIEW OF SYSTEMS:  CONSTITUTIONAL: The patient has fatigue.  EYES: No blurred vision.  EARS, NOSE, AND THROAT: No tinnitus. No ear pain. No epistaxis. No difficulty swallowing.  RESPIRATORY: Does have trouble breathing, no cough.  CARDIOVASCULAR: No chest pain. No palpitations.  GASTROINTESTINAL: The patient has ascites with increased abdominal swelling and shortness of breath, but denies any constipation or diarrhea. No nausea. No vomiting.  GENITOURINARY: No dysuria or trouble urinating.  ENDOCRINE: No polyuria or nocturia.  HEMATOLOGIC: The patient has no anemia.  INTEGUMENTARY: No skin rashes.  MUSCULOSKELETAL: No joint pain.  NEUROLOGIC: The patient has no numbness or weakness. No TIAs. PSYCHIATRIC: He does have anxiety and depression.   PHYSICAL EXAM: VITAL SIGNS: Temperature 98.4, heart rate 86, blood pressure 133/65, saturation is 99% on room air.  GENERAL: Alert, awake, oriented, 69 year old male seen in the ER, well developed, well nourished. HEAD: Normocephalic, atraumatic.  EYES: Pupils equal, reacting to light. No conjunctival pallor. No scleral icterus. Extraocular movements intact.  NOSE: No nasal lesions. No drainage.  EARS: No drainage or external lesions.  MOUTH: No lesions. No exudates.  NECK: Supple, symmetric, no masses. Thyroid in the midline, not enlarged. No JVD. No carotid bruit.  CARDIOVASCULAR: S1, S2 regular. No murmurs. No gallops.  LUNGS: Good respiratory effort. Mild basilar crepitations present.  GASTROINTESTINAL: Abdomen is soft, obese. Peripheral edema with ascites with fluid  thrill.>>. Bowel sounds are present in all quadrants.  MUSCULOSKELETAL: The patient is able to move all extremities x 4. Normal gait and station. No tenderness or effusion.  SKIN: Inspection is normal, well hydrated. No diaphoresis. No obvious wounds.  LYMPHATICS: No lymphadenopathy in cervical or axillary region.  NEUROLOGIC: Cranial nerves  II-XII are intact. DTRs symmetrical, 2/4 bilateral upper and lower extremities. The patient's motor strength 4/5 upper and lower extremities. Straight leg raise testing is negative bilaterally.  PSYCHIATRIC: Judgment and insight and adequate. Alert and oriented x 3. Memory and mood are intact. No illusions or hallucinations. No suicidal or homicidal ideations.   LABORATORY DATA: Her electrolytes: Sodium 136, potassium 3.6, chloride 100, bicarb 26, BUN 10, creatinine 1.14, glucose 109. INR 1.4. The patient's WBC 9, hemoglobin 11.1, hematocrit 34.2 and lipase 244.    Chest x-ray is not done.   ASSESSMENT AND PLAN:  95.  A 69 year old male with shortness of breath, ascites with weight gain. Admit him to hospitalist service. Start him on IV Lasix at 40 mg q. 12 hours and the patient has ascites with esophageal varices and alcoholic cirrhosis. He had EGD done and also had a paracentesis done before. I am going to ask ultrasound-guided paracentesis for tomorrow for symptom relief for therapeutic thoracentesis. The patient has heavy alcohol abuse, now not drinking anymore, but he already has alcoholic cirrhosis with esophageal varices. Continue him on Aldactone and nadolol. 2.  History of depression and anxiety. The patient was seen by Dr. Jennet Maduro in July and denies any suicidal or homicidal ideation at this time. The patient is supposed to be on BuSpar and also venlafaxine and he is not taking them anymore. The patient did have a paracentesis during the last time and according to the GI note, 20 liters of fluid was drained from his paracentesis. The patient is supposed to be on Aldactone 100 mg daily but he is on 50 mg daily. I would continue 50 mg and adjust it according to the response and the blood pressure.   TIME SPENT: Fifty-five minutes and we will watch him for any encephalopathy and continue prophylactic lactulose.    ____________________________ Katha Hamming, MD sk:TT D: 11/05/2014  16:47:30 ET T: 11/05/2014 17:03:01 ET JOB#: [id number]  cc: Katha Hamming, MD, <Dictator> Katha Hamming MD ELECTRONICALLY SIGNED 12/04/2014 6:19

## 2015-03-09 LAB — BODY FLUID CULTURE

## 2015-03-10 LAB — CULTURE, BLOOD (SINGLE)

## 2015-03-11 NOTE — H&P (Signed)
PATIENT NAME:  Jeff Preston, Jeff Preston MR#:  [id number] DATE OF BIRTH:  11/06/1946  DATE OF ADMISSION:  03/05/2015  REFERRING PHYSICIAN: Hinda Kehr, MD  PRIMARY CARE PHYSICIAN: Tomasa Hose, MD  ADMITTING DIAGNOSIS: Spontaneous bacterial peritonitis.   HISTORY OF PRESENT ILLNESS: This is a 69 year old Caucasian gentleman who presents to the Emergency Department complaining of abdominal pain. The patient states that his pain began yesterday and that he specifically was more concerned about his inguinal hernia, which appears to have worsened since that time. He denies fevers, but admits to nausea, vomiting, and diarrhea. The patient admits to having problems with alcohol abuse and has recently been on a drinking binge. He last drank 2 or 3 days ago and was drinking mostly beer. He states that he would drink approximately 12 beers in a 24-hour span, which proceeded this episode of abdominal pain. In the Emergency Department, he was found to have a grossly distended abdomen with ascites, on which the Emergency Department physician performed a paracentesis, which yielded 40 to 50 mL of turbid yellow fluid. Due to his symptoms of pain as well as his inguinal hernia, the Emergency Department staff called for admission.   REVIEW OF SYSTEMS: CONSTITUTIONAL: The patient denies fever, but admits to generalized weakness.  EYES: Denies blurred vision or inflammation.  EARS, NOSE AND THROAT: Denies tinnitus or sore throat.  RESPIRATORY: Denies cough or shortness of breath.  CARDIOVASCULAR: Denies chest pain, palpitations, orthopnea, or paroxysmal nocturnal dyspnea.  GASTROINTESTINAL: Admits to nausea, vomiting, diarrhea, and abdominal pain.  GENITOURINARY: Denies dysuria, increased frequency, or hesitancy of urination.  ENDOCRINE: Denies polyuria or polydipsia. HEMATOLOGIC AND LYMPHATIC: Denies easy bruising or bleeding.  INTEGUMENTARY: Denies rashes or lesions.  MUSCULOSKELETAL: Denies myalgias or arthralgias.   NEUROLOGIC: Denies numbness in his extremities or dysarthria.  PSYCHIATRIC: Denies depression or suicidal ideation.   PAST MEDICAL HISTORY: Alcoholic cirrhosis, history of bleeding varices, hemorrhoids, depression and anxiety.   PAST SURGICAL HISTORY: Cataract removal from the right eye, cholecystectomy.   SOCIAL HISTORY: The patient lives with his son. He does not smoke or do any drugs. He admits to alcoholism and has recently been drinking a 12 pack of beers every day.   FAMILY HISTORY: The patient's mother died from complications of congestive heart failure. His brother is deceased of a myocardial infarction. He is not aware of any cancers that run in the family.   MEDICATIONS: 1.  Ativan 1 mg 1 tablet p.o. t.i.d. as needed for anxiety.  2.  Furosemide 40 mg 1 tablet p.o. daily.  3.  Lactulose 10 grams/15 mL oral syrup 30 mL p.o. b.i.d.  4.  Nadolol 20 mg 1 tablet p.o. daily for blood pressure.  5.  Omeprazole 20 mg 1 capsule p.o. b.i.d.  6.  Promethazine 25 mg tablets 1/2 tablet every 6 hours as needed for nausea and vomiting.  7.  Simethicone 80 mg one chewable tablet 4 times a day before meals and bedtime for gas.  8.  Spironolactone 100 mg 1 tablet p.o. daily.  9.  Zolpidem 10 mg 1 tablet p.o. at bedtime as needed for sleep.   ALLERGIES: DILANTIN AND PENICILLIN.   PERTINENT LABORATORY RESULTS AND RADIOGRAPHIC FINDINGS: Serum glucose 96, BUN 7, creatinine 1.04, serum sodium 134, potassium 3.8, chloride 98, bicarb 26, calcium 8. Lipase 41. Ammonia level 31. Lactic acid 1.8. Ethanol level is now less than 5. Serum albumin 3, total bilirubin 1.9, alk phos 108, AST 61, ALT 22. Troponin is negative. White blood cell  count 8.7, hemoglobin 10.8, hematocrit 31.9, platelet count 151,000. INR 1.3.  Peritoneal fluid yielded 228 nucleated cells as well as 41 monocytes. Amylase is 9,  glucose was 120, LDH was 54, and protein was less than 3.   Urinalysis is negative for infection.   There is  no imaging.   PHYSICAL EXAMINATION: VITAL SIGNS: Temperature 98.3, pulse 74, respirations 20, blood pressure 138/80, pulse oximetry 99% on room air.  GENERAL: The patient is alert and oriented x3, in no apparent distress.  HEENT: Normocephalic, atraumatic. Pupils equal, round, and reactive to light and accommodation. Extraocular movements are intact. Mucous membranes are moist.  NECK: Trachea is midline. No adenopathy. Thyroid is nonpalpable and nontender.  CHEST: Symmetric and atraumatic.  CARDIOVASCULAR: Regular rate and rhythm. Normal S1, S2. No rubs, clicks, or murmurs appreciated.  LUNGS: Clear to auscultation bilaterally. Normal effort and excursion.  ABDOMEN: Positive bowel sounds. It is soft but tender throughout. The patient does not have any guarding nor rebound tenderness. There is no hepatosplenomegaly effect. The patient's liver edge is nonpalpable, but is tender in the general right upper quadrant.  GENITOURINARY: The patient's scrotum is dramatically swollen with loops of bowel that are reducible.  MUSCULOSKELETAL: The patient has 5/5 strength in his upper and lower extremities bilaterally. I have not observed the patient's gait. He has full range of motion in his upper and lower extremities bilaterally as well. SKIN: The patient has an erythematous, papular intertriginous rash in his groin that he states is very pruritic.  EXTREMITIES: No clubbing, cyanosis, or edema.  NEUROLOGIC: Cranial nerves II through XII are grossly intact. There is no asterixis present.   PSYCHIATRIC: Mood is normal. Affect is very flat. The patient seems to have fairly good judgment into his medical condition.   ASSESSMENT AND PLAN: This is a 69 year old male admitted for spontaneous bacterial peritonitis.  1.  Spontaneous bacterial peritonitis. The patient's peritoneal fluid was turbid and yellow. He technically does not meet criteria via nucleated cell count for spontaneous bacterial peritonitis, but he  has had clinical symptoms such as tender abdomen as well as nausea, vomiting, and diarrhea. We have started broad-spectrum antibiotics. We will obtain blood cultures, and I will culture the peritoneal fluid as well. I have also ordered pH assessment of the fluid to help support diagnosis of spontaneous bacterial peritonitis. We will continue spironolactone and Lasix in the appropriate ratio as well.  2.  Cirrhosis. We will continue lactulose.  3.  Alcohol abuse. The patient has recently had an alcohol binge. We will place him on a CIWA scale.  4.  History of bleeding varices. We will continue Nadolol.  5.  Deep vein thrombosis prophylaxis. SCDs for now. The patient's INR is normal.  6.  Gastrointestinal prophylaxis. None, as the patient is not critically ill.   CODE STATUS: FULL.  TIME SPENT ON ADMISSION AND PATIENT CARE: Approximately 40 minutes.   ____________________________ Norva Riffle. Marcille Blanco, MD msd:sb D: 03/05/2015 08:07:57 ET T: 03/05/2015 08:19:56 ET JOB#: [id number]  cc: Norva Riffle. Marcille Blanco, MD, <Dictator> Norva Riffle Oleta Gunnoe MD ELECTRONICALLY SIGNED 03/06/2015 0:21

## 2015-03-11 NOTE — Discharge Summary (Signed)
PATIENT NAME:  Jeff BreslowCARDEN, Dalvin W MR#:  [id number] DATE OF BIRTH:  09/21/1946  DATE OF ADMISSION:  03/05/2015 DATE OF DISCHARGE:  03/06/2015  HISTORY OF PRESENT ILLNESS:  For detailed note, please see the history and physical done on admission by Dr. Sheryle Hailiamond.   DIAGNOSES AT DISCHARGE:  Abdominal pain secondary to tense ascites; ascites, status post large-volume paracentesis; history of liver cirrhosis secondary to alcohol abuse; medical noncompliance; inguinal hernia; groin fungal infection.   DIET:  The patient is being discharged on a low-sodium diet.   ACTIVITY:  As tolerated.   FOLLOWUP:  With Dr. Letta PateAycock and Dr. Mechele CollinElliott in the next 1-2 weeks.     DISCHARGE MEDICATIONS:  Promethazine 25 mg half a tab to 1 tab q. 6 hours as needed, simethicone 80 mg 1 tab q. 4 hours as needed, omeprazole 20 mg b.i.d., Aldactone 100 mg daily, lactulose 30 mL b.i.d., nadolol 20 mg daily, Lasix 40 mg daily, Ativan 1 mg t.i.d. as needed, Ambien 10 mg at bedtime, nystatin topical cream to be applied q. 12 hours x 10 days.   PERTINENT STUDIES DONE DURING THE HOSPITAL COURSE:  An ultrasound-guided paracentesis done on April 25 with 15 liters of serous fluid removed.   HOSPITAL COURSE:  This is a 69 year old male with medical problems as mentioned above, presented to the hospital with abdominal pain and noted to have a distended abdomen.  1. Abdominal pain.  The most likely cause of this was his tense abdominal ascites.  There was some concern that he probably had spontaneous bacterial peritonitis. Therefore, he was given empiric antibiotics with ceftriaxone.  Although after he had his paracentesis done, his fluid studies were not consistent with spontaneous bacterial peritonitis.  He was therefore taken off IV antibiotics. He remains afebrile and hemodynamically stable, and his abdominal pain has now resolved after large-volume paracentesis.  He is now therefore being discharged home.  He is noncompliant with his  diuretics.  2. History of liver cirrhosis.  This is secondary to alcohol abuse.  The patient had no evidence of hepatic encephalopathy.  He will continue his lactulose as stated.  The patient did have tense ascites, underwent large-volume paracentesis.  He did receive albumin after his paracentesis.  For now, he will continue his Lasix, Aldactone, and nadolol for maintenance medications for his liver cirrhosis.  3. Inguinal hernia.  The patient was evaluated by surgery.  They did not think that the patient needed acute surgical intervention.  He was noted to have a fungal infection in his groin area for which he is being discharged on nystatin cream.  4. Alcohol abuse.  The patient did have mild episodes of alcohol withdrawal.  He was treated with CIWA protocol, although he is back to baseline now.   CODE STATUS:  The patient is a full code.   He was offered home health nursing and physical therapy services but he refused.   TIME SPENT ON DISCHARGE:  Forty minutes.    ____________________________ Rolly PancakeVivek J. Cherlynn KaiserSainani, MD vjs:kc D: 03/06/2015 15:51:00 ET T: 03/06/2015 21:51:40 ET JOB#: [id number]  cc: Ngwe A. Letta PateAycock, MD Scot Junobert T. [name], MD Rolly PancakeVivek J. Cherlynn KaiserSainani, MD, <Dictator>  Houston SirenVIVEK J Andreanna Mikolajczak MD ELECTRONICALLY SIGNED 03/09/2015 14:41

## 2015-03-14 ENCOUNTER — Other Ambulatory Visit: Payer: Self-pay | Admitting: Unknown Physician Specialty

## 2015-03-14 DIAGNOSIS — R188 Other ascites: Secondary | ICD-10-CM

## 2015-04-04 ENCOUNTER — Other Ambulatory Visit: Payer: Self-pay | Admitting: Radiology

## 2015-04-05 ENCOUNTER — Ambulatory Visit
Admission: RE | Admit: 2015-04-05 | Discharge: 2015-04-05 | Disposition: A | Discharge status: Home / Self Care | Payer: Medicare Other | Source: Ambulatory Visit | Attending: Unknown Physician Specialty | Admitting: Unknown Physician Specialty

## 2015-04-05 DIAGNOSIS — R188 Other ascites: Secondary | ICD-10-CM

## 2015-04-05 DIAGNOSIS — K7031 Alcoholic cirrhosis of liver with ascites: Secondary | ICD-10-CM | POA: Diagnosis not present

## 2015-04-05 HISTORY — DX: Depression, unspecified: F32.A

## 2015-04-05 HISTORY — DX: Gastro-esophageal reflux disease without esophagitis: K21.9

## 2015-04-05 HISTORY — DX: Essential (primary) hypertension: I10

## 2015-04-05 HISTORY — DX: Unspecified cirrhosis of liver: K74.60

## 2015-04-05 HISTORY — DX: Major depressive disorder, single episode, unspecified: F32.9

## 2015-04-05 MED ORDER — ALBUMIN HUMAN 25 % IV SOLN
25.0000 g | Freq: Once | INTRAVENOUS | Status: AC
  Administered 2015-04-05: 25 g via INTRAVENOUS
  Filled 2015-04-05: qty 100

## 2015-04-05 NOTE — OR Nursing (Signed)
Pt's siter will not be able to pick him up until 5:30 or 6 pm. US manager Kim said pt shouldn't be left in lobby post procedure. However when I called Nursing Supervisor Nathanial RancherJackie Vickers she said as long as the pt was discharged (and didn't get sedation) he could stay in the lobby and wait for his ride.

## 2015-05-01 ENCOUNTER — Ambulatory Visit
Admission: RE | Admit: 2015-05-01 | Discharge: 2015-05-01 | Disposition: A | Discharge status: Home / Self Care | Payer: Medicare Other | Source: Ambulatory Visit | Attending: Unknown Physician Specialty | Admitting: Unknown Physician Specialty

## 2015-05-01 ENCOUNTER — Ambulatory Visit: Admission: RE | Admit: 2015-05-01 | Payer: Medicare Other | Source: Ambulatory Visit

## 2015-05-01 ENCOUNTER — Other Ambulatory Visit: Payer: Self-pay | Admitting: Unknown Physician Specialty

## 2015-05-01 DIAGNOSIS — R188 Other ascites: Secondary | ICD-10-CM

## 2015-05-01 DIAGNOSIS — K7031 Alcoholic cirrhosis of liver with ascites: Secondary | ICD-10-CM | POA: Diagnosis present

## 2015-05-01 MED ORDER — ALBUMIN HUMAN 25 % IV SOLN
25.0000 g | Freq: Once | INTRAVENOUS | Status: AC
  Administered 2015-05-01: 25 g via INTRAVENOUS
  Filled 2015-05-01 (×2): qty 100

## 2015-05-01 NOTE — Procedures (Signed)
Paracentesis   Complications:  None  Blood Loss: none  See dictation in canopy pacs 

## 2015-05-28 ENCOUNTER — Other Ambulatory Visit: Payer: Self-pay | Admitting: Radiology

## 2015-05-29 ENCOUNTER — Ambulatory Visit: Payer: Self-pay

## 2015-05-29 ENCOUNTER — Ambulatory Visit
Admission: RE | Admit: 2015-05-29 | Discharge: 2015-05-29 | Disposition: A | Discharge status: Home / Self Care | Payer: Medicare Other | Source: Ambulatory Visit | Attending: Unknown Physician Specialty | Admitting: Unknown Physician Specialty

## 2015-05-29 DIAGNOSIS — K7031 Alcoholic cirrhosis of liver with ascites: Secondary | ICD-10-CM | POA: Diagnosis present

## 2015-05-29 DIAGNOSIS — R188 Other ascites: Secondary | ICD-10-CM

## 2015-05-29 MED ORDER — ALBUMIN HUMAN 25 % IV SOLN
25.0000 g | Freq: Once | INTRAVENOUS | Status: AC
  Administered 2015-05-29: 25 g via INTRAVENOUS
  Filled 2015-05-29: qty 100

## 2015-05-29 NOTE — Procedures (Signed)
US paracentesis  Complications:  None  Blood Loss: none  See dictation in canopy pacs  

## 2015-06-25 ENCOUNTER — Other Ambulatory Visit: Payer: Self-pay | Admitting: Radiology

## 2015-06-26 ENCOUNTER — Ambulatory Visit
Admission: RE | Admit: 2015-06-26 | Discharge: 2015-06-26 | Disposition: A | Discharge status: Home / Self Care | Payer: Medicare Other | Source: Ambulatory Visit | Attending: Unknown Physician Specialty | Admitting: Unknown Physician Specialty

## 2015-06-26 ENCOUNTER — Other Ambulatory Visit: Payer: Self-pay

## 2015-06-26 DIAGNOSIS — K7031 Alcoholic cirrhosis of liver with ascites: Secondary | ICD-10-CM | POA: Insufficient documentation

## 2015-06-26 DIAGNOSIS — R188 Other ascites: Secondary | ICD-10-CM

## 2015-06-26 MED ORDER — ALBUMIN HUMAN 25 % IV SOLN
25.0000 g | Freq: Once | INTRAVENOUS | Status: AC
  Administered 2015-06-26: 25 g via INTRAVENOUS
  Filled 2015-06-26: qty 100

## 2015-06-26 NOTE — H&P (Signed)
Recent H&P reviewed and updated.  No significant changes noted.  Will plan to proceed with procedure.

## 2015-06-26 NOTE — Procedures (Signed)
US paracentesis  Complications:  None  Blood Loss: none  See dictation in canopy pacs  

## 2015-06-26 NOTE — Discharge Instructions (Signed)
Paracentesis °Paracentesis is a procedure used to remove excess fluid from the belly (abdomen). Excess fluid in the belly is called ascites. Excess fluid can be the result of certain conditions, such as infection, inflammation, abdominal injury, heart failure, chronic scarring of the liver (cirrhosis), or cancer. The excess fluid is removed using a needle inserted through the skin and tissue into the abdomen.  °A paracentesis may be done to: °· Determine the cause of the excess fluid through examination of the fluid. °· Relieve symptoms of shortness of breath or pain caused by the excess fluid. °· Determine presence of bleeding after an abdominal injury. °LET YOUR CAREGIVERS KNOW ABOUT: °· Allergies. °· Medications taken including herbs, eye drops, over-the-counter medications, and creams. °· Use of steroids (by mouth or creams). °· Previous problems with anesthetics or numbing medicine. °· Possibility of pregnancy, if this applies. °· History of blood clots (thrombophlebitis). °· History of bleeding or blood problems. °· Previous surgery. °· Other health problems. °RISKS AND COMPLICATIONS °· Injury to an abdominal organ, such as the bowel (large intestine), liver, spleen, or bladder. °· Possible infection. °· Bleeding. °· Low blood pressure (hypotension). °BEFORE THE PROCEDURE °This is a procedure that can be done as an outpatient. Confirm the time that you need to arrive for your procedure. A blood sample may be done to determine your blood clotting time. The presence of a severe bleeding disorder (coagulopathy) which cannot be promptly corrected may make this procedure inadvisable. You may be asked to urinate. °PROCEDURE °The procedure will take about 30 minutes. This time will vary depending on the amount of fluid that is removed. You may be asked to lie on your back with your head elevated. An area on your abdomen will be cleansed. A numbing medicine may then be injected (local anesthesia) into the skin and  tissue. A needle is inserted through your abdominal skin and tissues until it is positioned in your abdomen. You may feel pressure or slight pain as the needle is positioned into the abdomen. Fluid is removed from the abdomen through the needle. Tell your caregiver if you feel dizzy or lightheaded. The needle is withdrawn once the desired amount of fluid has been removed. A sample of the fluid may be sent for examination.  °AFTER THE PROCEDURE °Your recovery will be assessed and monitored. If there are no problems, as an outpatient, you should be able to go home shortly after the procedure. There may be a very limited amount of clear fluid draining from the needle insertion site over the next 2 days. Confirm with your caregiver as to the expected amount of drainage. °Obtaining the Test Results °It is your responsibility to obtain your test results. Do not assume everything is normal if you have not heard from your caregiver or the medical facility. It is important for you to follow up on all of your test results. °HOME CARE INSTRUCTIONS  °· You may resume normal diet and activities as directed or allowed. °· Only take over-the-counter or prescription medicines for pain, discomfort, or fever as directed by your caregiver. °SEEK IMMEDIATE MEDICAL CARE IF: °· You develop shortness of breath or chest pain. °· You develop increasing pain, discomfort, or swelling in your abdomen. °· You develop new drainage or pus coming from site where fluid was removed. °· You develop swelling or increased redness from site where fluid was removed. °· You develop an unexplained temperature of 102° F (38.9° C) or above. °Document Released: 05/12/2005 Document Revised: 01/19/2012 Document   Reviewed: 06/18/2009 °[employer]® Patient Information ©2015 [employer]. This information is not intended to replace advice given to you by your health care provider. Make sure you discuss any questions you have with your health care provider. ° °

## 2015-07-22 ENCOUNTER — Emergency Department: Payer: Medicare Other

## 2015-07-22 ENCOUNTER — Emergency Department
Admission: EM | Admit: 2015-07-22 | Discharge: 2015-07-22 | Disposition: A | Discharge status: Home / Self Care | Payer: Medicare Other | Attending: Emergency Medicine | Admitting: Emergency Medicine

## 2015-07-22 DIAGNOSIS — R188 Other ascites: Secondary | ICD-10-CM | POA: Insufficient documentation

## 2015-07-22 DIAGNOSIS — R110 Nausea: Secondary | ICD-10-CM | POA: Insufficient documentation

## 2015-07-22 DIAGNOSIS — Z87891 Personal history of nicotine dependence: Secondary | ICD-10-CM | POA: Insufficient documentation

## 2015-07-22 DIAGNOSIS — Z79899 Other long term (current) drug therapy: Secondary | ICD-10-CM | POA: Diagnosis not present

## 2015-07-22 DIAGNOSIS — I10 Essential (primary) hypertension: Secondary | ICD-10-CM | POA: Diagnosis not present

## 2015-07-22 DIAGNOSIS — Z880 Allergy status to penicillin: Secondary | ICD-10-CM | POA: Insufficient documentation

## 2015-07-22 DIAGNOSIS — R109 Unspecified abdominal pain: Secondary | ICD-10-CM | POA: Diagnosis present

## 2015-07-22 DIAGNOSIS — R1084 Generalized abdominal pain: Secondary | ICD-10-CM | POA: Diagnosis not present

## 2015-07-22 LAB — PROTIME-INR
INR: 1.33
PROTHROMBIN TIME: 16.7 s — AB (ref 11.4–15.0)

## 2015-07-22 LAB — CBC
HCT: 29.1 % — ABNORMAL LOW (ref 40.0–52.0)
Hemoglobin: 9.8 g/dL — ABNORMAL LOW (ref 13.0–18.0)
MCH: 27.6 pg (ref 26.0–34.0)
MCHC: 33.8 g/dL (ref 32.0–36.0)
MCV: 81.6 fL (ref 80.0–100.0)
Platelets: 132 10*3/uL — ABNORMAL LOW (ref 150–440)
RBC: 3.57 MIL/uL — ABNORMAL LOW (ref 4.40–5.90)
RDW: 16 % — AB (ref 11.5–14.5)
WBC: 5.1 10*3/uL (ref 3.8–10.6)

## 2015-07-22 LAB — URINALYSIS COMPLETE WITH MICROSCOPIC (ARMC ONLY)
BILIRUBIN URINE: NEGATIVE
Bacteria, UA: NONE SEEN
Glucose, UA: NEGATIVE mg/dL
Hgb urine dipstick: NEGATIVE
KETONES UR: NEGATIVE mg/dL
LEUKOCYTES UA: NEGATIVE
Nitrite: NEGATIVE
PH: 7 (ref 5.0–8.0)
PROTEIN: NEGATIVE mg/dL
SQUAMOUS EPITHELIAL / LPF: NONE SEEN
Specific Gravity, Urine: 1.002 — ABNORMAL LOW (ref 1.005–1.030)

## 2015-07-22 LAB — COMPREHENSIVE METABOLIC PANEL
ALBUMIN: 2.7 g/dL — AB (ref 3.5–5.0)
ALK PHOS: 98 U/L (ref 38–126)
ALT: 18 U/L (ref 17–63)
AST: 47 U/L — AB (ref 15–41)
Anion gap: 4 — ABNORMAL LOW (ref 5–15)
BILIRUBIN TOTAL: 1.3 mg/dL — AB (ref 0.3–1.2)
BUN: 10 mg/dL (ref 6–20)
CALCIUM: 7.4 mg/dL — AB (ref 8.9–10.3)
CO2: 24 mmol/L (ref 22–32)
Chloride: 110 mmol/L (ref 101–111)
Creatinine, Ser: 1.1 mg/dL (ref 0.61–1.24)
GFR calc Af Amer: >60 mL/min (ref >60)
GFR calc non Af Amer: >60 mL/min (ref >60)
GLUCOSE: 91 mg/dL (ref 65–99)
Potassium: 2.9 mmol/L — CL (ref 3.5–5.1)
SODIUM: 138 mmol/L (ref 135–145)
TOTAL PROTEIN: 6.5 g/dL (ref 6.5–8.1)

## 2015-07-22 LAB — LIPASE, BLOOD: Lipase: 31 U/L (ref 22–51)

## 2015-07-22 MED ORDER — ALBUMIN HUMAN 25 % IV SOLN
25.0000 g | Freq: Once | INTRAVENOUS | Status: AC
  Administered 2015-07-22: 25 g via INTRAVENOUS
  Filled 2015-07-22: qty 100

## 2015-07-22 MED ORDER — LORAZEPAM 2 MG/ML IJ SOLN
1.0000 mg | Freq: Once | INTRAMUSCULAR | Status: AC
  Administered 2015-07-22: 1 mg via INTRAVENOUS
  Filled 2015-07-22: qty 1

## 2015-07-22 MED ORDER — MORPHINE SULFATE (PF) 4 MG/ML IV SOLN
4.0000 mg | Freq: Once | INTRAVENOUS | Status: AC
  Administered 2015-07-22: 4 mg via INTRAVENOUS
  Filled 2015-07-22: qty 1

## 2015-07-22 MED ORDER — PROMETHAZINE HCL 25 MG/ML IJ SOLN
25.0000 mg | Freq: Once | INTRAMUSCULAR | Status: AC
  Administered 2015-07-22: 25 mg via INTRAVENOUS
  Filled 2015-07-22: qty 1

## 2015-07-22 NOTE — ED Provider Notes (Signed)
Patient has received albumen. He is hemodynamically stable in no distress. He reports feeling much better. Abdomen is nondistended and nontender. No fevers no evidence or symptoms suggest SBP. He has follow-up with Dr. Mechele Collin established. We'll discharge him home and discussed return precautions.  Sharyn Creamer, MD 07/22/15 [phone]

## 2015-07-22 NOTE — Discharge Instructions (Signed)
Abdominal Pain Many things can cause abdominal pain. Usually, abdominal pain is not caused by a disease and will improve without treatment. It can often be observed and treated at home. Your health care provider will do a physical exam and possibly order blood tests and X-rays to help determine the seriousness of your pain. However, in many cases, more time must pass before a clear cause of the pain can be found. Before that point, your health care provider may not know if you need more testing or further treatment. HOME CARE INSTRUCTIONS  Monitor your abdominal pain for any changes. The following actions may help to alleviate any discomfort you are experiencing:  Only take over-the-counter or prescription medicines as directed by your health care provider.  Do not take laxatives unless directed to do so by your health care provider.  Try a clear liquid diet (broth, tea, or water) as directed by your health care provider. Slowly move to a bland diet as tolerated. SEEK MEDICAL CARE IF:  You have unexplained abdominal pain.  You have abdominal pain associated with nausea or diarrhea.  You have pain when you urinate or have a bowel movement.  You experience abdominal pain that wakes you in the night.  You have abdominal pain that is worsened or improved by eating food.  You have abdominal pain that is worsened with eating fatty foods.  You have a fever. SEEK IMMEDIATE MEDICAL CARE IF:   Your pain does not go away within 2 hours.  You keep throwing up (vomiting).  Your pain is felt only in portions of the abdomen, such as the right side or the left lower portion of the abdomen.  You pass bloody or black tarry stools. MAKE SURE YOU:  Understand these instructions.   Will watch your condition.   Will get help right away if you are not doing well or get worse.  Document Released: 08/06/2005 Document Revised: 11/01/2013 Document Reviewed: 07/06/2013 [facility] Patient Information  2015 [place], [place]. This information is not intended to replace advice given to you by your health care provider. Make sure you discuss any questions you have with your health care provider.  Ascites Ascites is a gathering of fluid in the belly (abdomen). This is most often caused by liver disease. It may also be caused by a number of other less common problems. It causes a ballooning out (distension) of the abdomen. CAUSES  Scarring of the liver (cirrhosis) is the most common cause of ascites. Other causes include:  Infection or inflammation in the abdomen.  Cancer in the abdomen.  Heart failure.  Certain forms of kidney failure (nephritic syndrome).  Inflammation of the pancreas.  Clots in the veins of the liver. SYMPTOMS  In the early stages of ascites, you may not have any symptoms. The main symptom of ascites is a sense of abdominal bloating. This is due to the presence of fluid. This may also cause an increase in abdominal or waist size. People with this condition can develop swelling in the legs, and men can develop a swollen scrotum. When there is a lot of fluid, it may be hard to breath. Stretching of the abdomen by fluid can be painful. DIAGNOSIS  Certain features of your medical history, such as a history of liver disease and of an enlarging abdomen, can suggest the presence of ascites. The diagnosis of ascites can be made on physical exam by your caregiver. An abdominal ultrasound examination can confirm that ascites is present, and estimate the  amount of fluid. Once ascites is confirmed, it is important to determine its cause. Again, a history of one of the conditions listed in "CAUSES" provides a strong clue. A physical exam is important, and blood and X-ray tests may be needed. During a procedure called paracentesis, a sample of fluid is removed from the abdomen. This can determine certain key features about the fluid, such as whether or not infection or cancer is present. Your  caregiver will determine if a paracentesis is necessary. They will describe the procedure to you. PREVENTION  Ascites is a complication of other conditions. Therefore to prevent ascites, you must seek treatment for any significant health conditions you have. Once ascites is present, careful attention to fluid and salt intake may help prevent it from getting worse. If you have ascites, you should not drink alcohol. PROGNOSIS  The prognosis of ascites depends on the underlying disease. If the disease is reversible, such as with certain infections or with heart failure, then ascites may improve or disappear. When ascites is caused by cirrhosis, then it indicates that the liver disease has worsened, and further evaluation and treatment of the liver disease is needed. If your ascites is caused by cancer, then the success or failure of the cancer treatment will determine whether your ascites will improve or worsen. RISKS AND COMPLICATIONS  Ascites is likely to worsen if it is not properly diagnosed and treated. A large amount of ascites can cause pain and difficulty breathing. The main complication, besides worsening, is infection (called spontaneous bacterial peritonitis). This requires prompt treatment. TREATMENT  The treatment of ascites depends on its cause. When liver disease is your cause, medical management using water pills (diuretics) and decreasing salt intake is often effective. Ascites due to peritoneal inflammation or malignancy (cancer) alone does not respond to salt restriction and diuretics. Hospitalization is sometimes required. If the treatment of ascites cannot be managed with medications, a number of other treatments are available. Your caregivers will help you decide which will work best for you. Some of these are:  Removal of fluid from the abdomen (paracentesis).  Fluid from the abdomen is passed into a vein (peritoneovenous shunting).  Liver transplantation.  Transjugular  intrahepatic portosystemic stent shunt. HOME CARE INSTRUCTIONS  It is important to monitor body weight and the intake and output of fluids. Weigh yourself at the same time every day. Record your weights. Fluid restriction may be necessary. It is also important to know your salt intake. The more salt you take in, the more fluid you will retain. Ninety percent of people with ascites respond to this approach.  Follow any directions for medicines carefully.  Follow up with your caregiver, as directed.  Report any changes in your health, especially any new or worsening symptoms.  If your ascites is from liver disease, avoid alcohol and other substances toxic to the liver. SEEK MEDICAL CARE IF:   Your weight increases more than a few pounds in a few days.  Your abdominal or waist size increases.  You develop swelling in your legs.  You had swelling and it worsens. SEEK IMMEDIATE MEDICAL CARE IF:   You develop a fever.  You develop new abdominal pain.  You develop difficulty breathing.  You develop confusion.  You have bleeding from the mouth, stomach, or rectum. MAKE SURE YOU:   Understand these instructions.  Will watch your condition.  Will get help right away if you are not doing well or get worse. Document Released: 10/27/2005 Document Revised: 01/19/2012  Document Reviewed: 05/28/2007 [facility] Patient Information 2015 [place], [place]. This information is not intended to replace advice given to you by your health care provider. Make sure you discuss any questions you have with your health care provider.  Ascites Ascites is a gathering of fluid in the belly (abdomen). This is most often caused by liver disease. It may also be caused by a number of other less common problems. It causes a ballooning out (distension) of the abdomen. CAUSES  Scarring of the liver (cirrhosis) is the most common cause of ascites. Other causes include:  Infection or inflammation in the  abdomen.  Cancer in the abdomen.  Heart failure.  Certain forms of kidney failure (nephritic syndrome).  Inflammation of the pancreas.  Clots in the veins of the liver. SYMPTOMS  In the early stages of ascites, you may not have any symptoms. The main symptom of ascites is a sense of abdominal bloating. This is due to the presence of fluid. This may also cause an increase in abdominal or waist size. People with this condition can develop swelling in the legs, and men can develop a swollen scrotum. When there is a lot of fluid, it may be hard to breath. Stretching of the abdomen by fluid can be painful. DIAGNOSIS  Certain features of your medical history, such as a history of liver disease and of an enlarging abdomen, can suggest the presence of ascites. The diagnosis of ascites can be made on physical exam by your caregiver. An abdominal ultrasound examination can confirm that ascites is present, and estimate the amount of fluid. Once ascites is confirmed, it is important to determine its cause. Again, a history of one of the conditions listed in "CAUSES" provides a strong clue. A physical exam is important, and blood and X-ray tests may be needed. During a procedure called paracentesis, a sample of fluid is removed from the abdomen. This can determine certain key features about the fluid, such as whether or not infection or cancer is present. Your caregiver will determine if a paracentesis is necessary. They will describe the procedure to you. PREVENTION  Ascites is a complication of other conditions. Therefore to prevent ascites, you must seek treatment for any significant health conditions you have. Once ascites is present, careful attention to fluid and salt intake may help prevent it from getting worse. If you have ascites, you should not drink alcohol. PROGNOSIS  The prognosis of ascites depends on the underlying disease. If the disease is reversible, such as with certain infections or with  heart failure, then ascites may improve or disappear. When ascites is caused by cirrhosis, then it indicates that the liver disease has worsened, and further evaluation and treatment of the liver disease is needed. If your ascites is caused by cancer, then the success or failure of the cancer treatment will determine whether your ascites will improve or worsen. RISKS AND COMPLICATIONS  Ascites is likely to worsen if it is not properly diagnosed and treated. A large amount of ascites can cause pain and difficulty breathing. The main complication, besides worsening, is infection (called spontaneous bacterial peritonitis). This requires prompt treatment. TREATMENT  The treatment of ascites depends on its cause. When liver disease is your cause, medical management using water pills (diuretics) and decreasing salt intake is often effective. Ascites due to peritoneal inflammation or malignancy (cancer) alone does not respond to salt restriction and diuretics. Hospitalization is sometimes required. If the treatment of ascites cannot be managed with medications, a number of  other treatments are available. Your caregivers will help you decide which will work best for you. Some of these are:  Removal of fluid from the abdomen (paracentesis).  Fluid from the abdomen is passed into a vein (peritoneovenous shunting).  Liver transplantation.  Transjugular intrahepatic portosystemic stent shunt. HOME CARE INSTRUCTIONS  It is important to monitor body weight and the intake and output of fluids. Weigh yourself at the same time every day. Record your weights. Fluid restriction may be necessary. It is also important to know your salt intake. The more salt you take in, the more fluid you will retain. Ninety percent of people with ascites respond to this approach.  Follow any directions for medicines carefully.  Follow up with your caregiver, as directed.  Report any changes in your health, especially any new or  worsening symptoms.  If your ascites is from liver disease, avoid alcohol and other substances toxic to the liver. SEEK MEDICAL CARE IF:   Your weight increases more than a few pounds in a few days.  Your abdominal or waist size increases.  You develop swelling in your legs.  You had swelling and it worsens. SEEK IMMEDIATE MEDICAL CARE IF:   You develop a fever.  You develop new abdominal pain.  You develop difficulty breathing.  You develop confusion.  You have bleeding from the mouth, stomach, or rectum. MAKE SURE YOU:   Understand these instructions.  Will watch your condition.  Will get help right away if you are not doing well or get worse. Document Released: 10/27/2005 Document Revised: 01/19/2012 Document Reviewed: 05/28/2007 [facility] Patient Information 2015 [place], [place]. This information is not intended to replace advice given to you by your health care provider. Make sure you discuss any questions you have with your health care provider.

## 2015-07-22 NOTE — ED Provider Notes (Signed)
[facility] Emergency Department Provider Note  Time seen: 9:57 AM  I have reviewed the triage vital signs and the nursing notes.   HISTORY  Chief Complaint Abdominal Pain and Ascites    HPI Jeff Preston is a 69 y.o. male with a past medical history of hypertension depression, alcohol-related cirrhosis who presents to the emergency department for abdominal pain. According to the patient is a long history of ascites, and for the past few months has been receiving monthly paracentesis drainages. Patient is scheduled for a paracentesis drain on Tuesday (2 days from today), but the patient's pain worsened acutely over the past 2 days and he did not feel he could bear waiting so he came to the emergency department. Describes his abdominal pain as tightness across his entire abdomen which causes him to feel very nauseated. Describes it as severe 10/10 pain. Denies any fever. States it feels like his typical ascites pain when his abdomen is distended. Patient has been following with Dr. Lynnae Prude for drainages.    Past Medical History  Diagnosis Date  . Hypertension   . Depression   . GERD (gastroesophageal reflux disease)   . Cirrhosis     There are no active problems to display for this patient.   Past Surgical History  Procedure Laterality Date  . Throat surgery      Current Outpatient Rx  Name  Route  Sig  Dispense  Refill  . furosemide (LASIX) 20 MG tablet   Oral   Take 20 mg by mouth.         Marland Kitchen LORazepam (ATIVAN) 0.5 MG tablet   Oral   Take 0.5 mg by mouth every 8 (eight) hours.         . magnesium 30 MG tablet   Oral   Take 30 mg by mouth 2 (two) times daily.         . nadolol (CORGARD) 20 MG tablet   Oral   Take 20 mg by mouth daily.         Marland Kitchen omeprazole (PRILOSEC) 20 MG capsule   Oral   Take 20 mg by mouth daily.         . promethazine (PHENERGAN) 25 MG tablet   Oral   Take 25 mg by mouth every 6 (six) hours as  needed for nausea or vomiting.         . simethicone (MYLICON) 80 MG chewable tablet   Oral   Chew 80 mg by mouth every 6 (six) hours as needed for flatulence.         Marland Kitchen spironolactone (ALDACTONE) 50 MG tablet   Oral   Take 50 mg by mouth daily.         Marland Kitchen zolpidem (AMBIEN) 10 MG tablet   Oral   Take 10 mg by mouth at bedtime as needed for sleep.           Allergies Dilantin and Penicillins  History reviewed. No pertinent family history.  Social History Social History  Substance Use Topics  . Smoking status: Former Games developer  . Smokeless tobacco: None  . Alcohol Use: No    Review of Systems Constitutional: Negative for fever. Cardiovascular: Negative for chest pain. Respiratory: Negative for shortness of breath. Gastrointestinal: Positive for abdominal pain and distention, and nausea. Negative for vomiting or diarrhea. Genitourinary: Negative for dysuria. 10-point ROS otherwise negative.  ____________________________________________   PHYSICAL EXAM:  VITAL SIGNS: ED Triage Vitals  Enc Vitals Group  BP 07/22/15 0951 148/72 mmHg     Pulse Rate 07/22/15 0951 72     Resp 07/22/15 0951 22     Temp 07/22/15 0951 98.7 F (37.1 C)     Temp Source 07/22/15 0951 Oral     SpO2 07/22/15 0951 100 %     Weight 07/22/15 0951 219 lb (99.338 kg)     Height 07/22/15 0951 5\' 6"  (1.676 m)     Head Cir --      Peak Flow --      Pain Score 07/22/15 0953 7     Pain Loc --      Pain Edu? --      Excl. in GC? --     Constitutional: Alert and oriented. Mild distress due to pain. Eyes: Normal exam ENT   Mouth/Throat: Mucous membranes are moist. Cardiovascular: Normal rate, regular rhythm.  Respiratory: Normal respiratory effort without tachypnea nor retractions. Breath sounds are clear and equal bilaterally. No wheezes/rales/rhonchi. Gastrointestinal: Distended abdomen, dull to percussion diffusely. Mild diffuse tenderness to palpation without rebound or guarding.  Abdominal exam consistent with extensive ascites. Musculoskeletal: Nontender with normal range of motion in all extremities.  Neurologic:  Normal speech and language. No gross focal neurologic deficits  Skin:  Skin is warm, dry and intact.  Psychiatric: Mood and affect are normal. Speech and behavior are normal. ____________________________________________    INITIAL IMPRESSION / ASSESSMENT AND PLAN / ED COURSE  Pertinent labs & imaging results that were available during my care of the patient were reviewed by me and considered in my medical decision making (see chart for details).  Patient presents the emergency department with abdominal distention and discomfort. He was scheduled for an ultrasound-guided paracentesis in 2 days but his pain is worsened to much so he came to the emergency department. We will check labs, closely monitor in the emergency department. I discussed the patient with the interventional radiologist Dr. Loreta Ave, who states he will likely be able to perform a paracentesis this afternoon. Patient is agreeable to this plan. We'll monitor closely in the emergency department until paracentesis can be performed.  Patient is in ultrasound receiving his paracentesis currently. We will dose albumen which she normally receives after a paracentesis, monitor in the emergency department discharge home. Patient care signed out to oncoming physician.  ____________________________________________   FINAL CLINICAL IMPRESSION(S) / ED DIAGNOSES  Abdominal pain/distention Ascites  Minna Antis, MD 07/22/15 1447

## 2015-07-22 NOTE — ED Notes (Signed)
Pt presents via EMS c/o abd pain and swelling for last couple of days. 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 Denies ETOH for a couple of months.  IV zofran given via EMS PTA.

## 2015-07-22 NOTE — ED Notes (Signed)
Albumin completed, MD notified.

## 2015-07-22 NOTE — ED Notes (Signed)
Pt transported for paracentesis  ?

## 2015-07-22 NOTE — ED Notes (Signed)
Pt back from US

## 2015-07-22 NOTE — ED Notes (Signed)
Pt has scheduled paracentesis monthly, scheduled for this coming Tuesday. U/S called and stated radiologist to complete paracentesis some time this afternoon. MD informed.

## 2015-07-22 NOTE — ED Notes (Signed)
Pt patiently waiting with family at bedside, notified wait due to u/s and radiology to complete paracentesis per order. Pt acknowledges. Assisted to restroom per pt request. Nausea improved. Will continue to monitor.

## 2015-07-24 ENCOUNTER — Ambulatory Visit: Admission: RE | Admit: 2015-07-24 | Payer: Medicare Other | Source: Ambulatory Visit

## 2015-07-24 ENCOUNTER — Ambulatory Visit: Payer: Self-pay

## 2015-08-21 ENCOUNTER — Ambulatory Visit
Admission: RE | Admit: 2015-08-21 | Discharge: 2015-08-21 | Disposition: A | Discharge status: Home / Self Care | Payer: Medicare Other | Source: Ambulatory Visit | Attending: Unknown Physician Specialty | Admitting: Unknown Physician Specialty

## 2015-08-21 DIAGNOSIS — R188 Other ascites: Secondary | ICD-10-CM | POA: Insufficient documentation

## 2015-08-21 MED ORDER — ALBUMIN HUMAN 25 % IV SOLN
25.0000 g | Freq: Once | INTRAVENOUS | Status: AC
  Administered 2015-08-21: 25 g via INTRAVENOUS
  Filled 2015-08-21: qty 100

## 2015-08-22 ENCOUNTER — Other Ambulatory Visit: Payer: Self-pay | Admitting: Unknown Physician Specialty

## 2015-08-22 DIAGNOSIS — K7031 Alcoholic cirrhosis of liver with ascites: Secondary | ICD-10-CM

## 2015-09-18 ENCOUNTER — Ambulatory Visit
Admission: RE | Admit: 2015-09-18 | Discharge: 2015-09-18 | Disposition: A | Discharge status: Home / Self Care | Payer: Medicare Other | Source: Ambulatory Visit | Attending: Unknown Physician Specialty | Admitting: Unknown Physician Specialty

## 2015-09-18 DIAGNOSIS — K7031 Alcoholic cirrhosis of liver with ascites: Secondary | ICD-10-CM | POA: Diagnosis not present

## 2015-09-18 MED ORDER — ALBUMIN HUMAN 25 % IV SOLN
25.0000 g | Freq: Once | INTRAVENOUS | Status: AC
  Administered 2015-09-18: 25 g via INTRAVENOUS
  Filled 2015-09-18: qty 100

## 2015-10-22 ENCOUNTER — Other Ambulatory Visit: Payer: Self-pay | Admitting: Unknown Physician Specialty

## 2015-10-22 DIAGNOSIS — K7031 Alcoholic cirrhosis of liver with ascites: Secondary | ICD-10-CM

## 2015-10-23 ENCOUNTER — Other Ambulatory Visit (HOSPITAL_COMMUNITY): Payer: Self-pay | Admitting: Interventional Radiology

## 2015-10-23 ENCOUNTER — Other Ambulatory Visit: Payer: Self-pay | Admitting: Unknown Physician Specialty

## 2015-10-23 ENCOUNTER — Ambulatory Visit
Admission: RE | Admit: 2015-10-23 | Discharge: 2015-10-23 | Disposition: A | Discharge status: Home / Self Care | Payer: Medicare Other | Source: Ambulatory Visit | Attending: Unknown Physician Specialty | Admitting: Unknown Physician Specialty

## 2015-10-23 DIAGNOSIS — K7460 Unspecified cirrhosis of liver: Secondary | ICD-10-CM | POA: Diagnosis not present

## 2015-10-23 DIAGNOSIS — K7031 Alcoholic cirrhosis of liver with ascites: Secondary | ICD-10-CM

## 2015-10-23 DIAGNOSIS — R188 Other ascites: Secondary | ICD-10-CM | POA: Insufficient documentation

## 2015-10-23 MED ORDER — ALBUMIN HUMAN 25 % IV SOLN
25.0000 g | Freq: Once | INTRAVENOUS | Status: AC
  Administered 2015-10-23: 25 g via INTRAVENOUS
  Filled 2015-10-23: qty 100

## 2015-10-23 NOTE — Procedures (Signed)
Successful US LG VOL PARACENTESIS  No comp Stable Full report in PACS

## 2015-10-23 NOTE — Discharge Instructions (Signed)
Paracentesis, Care After °Refer to this sheet in the next few weeks. These instructions provide you with information about caring for yourself after your procedure. Your health care provider may also give you more specific instructions. Your treatment has been planned according to current medical practices, but problems sometimes occur. Call your health care provider if you have any problems or questions after your procedure. °WHAT TO EXPECT AFTER THE PROCEDURE °After your procedure, it is common to have a small amount of clear fluid coming from the puncture site. °HOME CARE INSTRUCTIONS °· Return to your normal activities as told by your health care provider. Ask your health care provider what activities are safe for you. °· Take over-the-counter and prescription medicines only as told by your health care provider. °· Do not take baths, swim, or use a hot tub until your health care provider approves. °· Follow instructions from your health care provider about: °¨ How to take care of your puncture site. °¨ When and how you should change your bandage (dressing). °¨ When you should remove your dressing. °· Check your puncture area every day signs of infection. Watch for: °¨ Redness, swelling, or pain. °¨ Fluid, blood, or pus. °· Keep all follow-up visits as told by your health care provider. This is important. °SEEK MEDICAL CARE IF: °· You have redness, swelling, or pain at your puncture site. °· You start to have more clear fluid coming from your puncture site. °· You have blood or pus coming from your puncture site. °· You have chills. °· You have a fever. °SEEK IMMEDIATE MEDICAL CARE IF: °· You develop chest pain or shortness of breath. °· You develop increasing pain, discomfort, or swelling in your abdomen. °· You feel dizzy or light-headed or you pass out. °  °This information is not intended to replace advice given to you by your health care provider. Make sure you discuss any questions you have with your health  care provider. °  °Document Released: 03/13/2015 Document Reviewed: 03/13/2015 °Elsevier Interactive Patient Education ©2016 [employer] ° °

## 2015-11-20 ENCOUNTER — Ambulatory Visit
Admission: RE | Admit: 2015-11-20 | Discharge: 2015-11-20 | Disposition: A | Discharge status: Home / Self Care | Payer: Medicare Other | Source: Ambulatory Visit | Attending: Unknown Physician Specialty | Admitting: Unknown Physician Specialty

## 2015-11-20 DIAGNOSIS — K7031 Alcoholic cirrhosis of liver with ascites: Secondary | ICD-10-CM | POA: Diagnosis not present

## 2015-11-20 MED ORDER — ALBUMIN HUMAN 25 % IV SOLN
25.0000 g | Freq: Once | INTRAVENOUS | Status: AC
  Administered 2015-11-20: 25 g via INTRAVENOUS
  Filled 2015-11-20: qty 100

## 2015-11-20 NOTE — Procedures (Signed)
Successful Lg vol paracentesis No comp Stable Full report in PACS

## 2015-12-18 ENCOUNTER — Other Ambulatory Visit: Payer: Self-pay | Admitting: Unknown Physician Specialty

## 2015-12-18 ENCOUNTER — Ambulatory Visit
Admission: RE | Admit: 2015-12-18 | Discharge: 2015-12-18 | Disposition: A | Discharge status: Home / Self Care | Payer: Medicare Other | Source: Ambulatory Visit | Attending: Unknown Physician Specialty | Admitting: Unknown Physician Specialty

## 2015-12-18 DIAGNOSIS — R188 Other ascites: Secondary | ICD-10-CM

## 2015-12-18 MED ORDER — ALBUMIN HUMAN 25 % IV SOLN
25.0000 g | Freq: Once | INTRAVENOUS | Status: AC
  Administered 2015-12-18: 25 g via INTRAVENOUS
  Filled 2015-12-18: qty 100

## 2015-12-18 NOTE — Procedures (Signed)
US paracentesis without difficulty  Complications:  None  Blood Loss: none  See dictation in canopy pacs  

## 2016-01-15 ENCOUNTER — Ambulatory Visit
Admission: RE | Admit: 2016-01-15 | Discharge: 2016-01-15 | Disposition: A | Discharge status: Home / Self Care | Payer: Medicare Other | Source: Ambulatory Visit | Attending: Unknown Physician Specialty | Admitting: Unknown Physician Specialty

## 2016-01-15 DIAGNOSIS — R188 Other ascites: Secondary | ICD-10-CM | POA: Diagnosis present

## 2016-01-15 MED ORDER — ALBUMIN HUMAN 25 % IV SOLN
25.0000 g | Freq: Once | INTRAVENOUS | Status: AC
  Administered 2016-01-15: 25 g via INTRAVENOUS
  Filled 2016-01-15: qty 100

## 2016-01-15 NOTE — Procedures (Signed)
Under US guidance, paracentesis was performed without complication. 

## 2016-02-12 ENCOUNTER — Ambulatory Visit
Admission: RE | Admit: 2016-02-12 | Discharge: 2016-02-12 | Disposition: A | Discharge status: Home / Self Care | Payer: Medicare Other | Source: Ambulatory Visit | Attending: Unknown Physician Specialty | Admitting: Unknown Physician Specialty

## 2016-02-12 DIAGNOSIS — R188 Other ascites: Secondary | ICD-10-CM | POA: Diagnosis not present

## 2016-02-12 MED ORDER — ALBUMIN HUMAN 25 % IV SOLN
25.0000 g | Freq: Once | INTRAVENOUS | Status: AC
  Administered 2016-02-12: 25 g via INTRAVENOUS
  Filled 2016-02-12: qty 100

## 2016-02-12 NOTE — Procedures (Signed)
US paracentesis  Complications:  None  Blood Loss: none  See dictation in canopy pacs  

## 2016-02-13 ENCOUNTER — Other Ambulatory Visit: Payer: Self-pay | Admitting: Unknown Physician Specialty

## 2016-02-13 DIAGNOSIS — K7031 Alcoholic cirrhosis of liver with ascites: Secondary | ICD-10-CM

## 2016-03-12 ENCOUNTER — Inpatient Hospital Stay
Admission: EM | Admit: 2016-03-12 | Discharge: 2016-03-17 | DRG: 394 | Disposition: A | Discharge status: Hospice | Payer: Medicare Other | Attending: Internal Medicine | Admitting: Internal Medicine

## 2016-03-12 ENCOUNTER — Encounter: Payer: Self-pay | Admitting: Emergency Medicine

## 2016-03-12 DIAGNOSIS — K421 Umbilical hernia with gangrene: Principal | ICD-10-CM | POA: Diagnosis present

## 2016-03-12 DIAGNOSIS — E871 Hypo-osmolality and hyponatremia: Secondary | ICD-10-CM | POA: Diagnosis present

## 2016-03-12 DIAGNOSIS — H109 Unspecified conjunctivitis: Secondary | ICD-10-CM | POA: Diagnosis not present

## 2016-03-12 DIAGNOSIS — F419 Anxiety disorder, unspecified: Secondary | ICD-10-CM | POA: Diagnosis present

## 2016-03-12 DIAGNOSIS — E8809 Other disorders of plasma-protein metabolism, not elsewhere classified: Secondary | ICD-10-CM | POA: Diagnosis present

## 2016-03-12 DIAGNOSIS — I10 Essential (primary) hypertension: Secondary | ICD-10-CM | POA: Diagnosis present

## 2016-03-12 DIAGNOSIS — S31105A Unspecified open wound of abdominal wall, periumbilic region without penetration into peritoneal cavity, initial encounter: Secondary | ICD-10-CM

## 2016-03-12 DIAGNOSIS — D684 Acquired coagulation factor deficiency: Secondary | ICD-10-CM | POA: Diagnosis present

## 2016-03-12 DIAGNOSIS — F1020 Alcohol dependence, uncomplicated: Secondary | ICD-10-CM | POA: Diagnosis present

## 2016-03-12 DIAGNOSIS — B9561 Methicillin susceptible Staphylococcus aureus infection as the cause of diseases classified elsewhere: Secondary | ICD-10-CM | POA: Diagnosis present

## 2016-03-12 DIAGNOSIS — K7210 Chronic hepatic failure without coma: Secondary | ICD-10-CM | POA: Diagnosis present

## 2016-03-12 DIAGNOSIS — R188 Other ascites: Secondary | ICD-10-CM | POA: Diagnosis not present

## 2016-03-12 DIAGNOSIS — E876 Hypokalemia: Secondary | ICD-10-CM | POA: Diagnosis not present

## 2016-03-12 DIAGNOSIS — H1132 Conjunctival hemorrhage, left eye: Secondary | ICD-10-CM | POA: Diagnosis not present

## 2016-03-12 DIAGNOSIS — Z87891 Personal history of nicotine dependence: Secondary | ICD-10-CM

## 2016-03-12 DIAGNOSIS — Z66 Do not resuscitate: Secondary | ICD-10-CM | POA: Diagnosis present

## 2016-03-12 DIAGNOSIS — Z79899 Other long term (current) drug therapy: Secondary | ICD-10-CM

## 2016-03-12 DIAGNOSIS — K7031 Alcoholic cirrhosis of liver with ascites: Secondary | ICD-10-CM | POA: Diagnosis present

## 2016-03-12 DIAGNOSIS — K219 Gastro-esophageal reflux disease without esophagitis: Secondary | ICD-10-CM | POA: Diagnosis present

## 2016-03-12 HISTORY — DX: Anxiety disorder, unspecified: F41.9

## 2016-03-12 LAB — CBC WITH DIFFERENTIAL/PLATELET
BASOS ABS: 0 10*3/uL (ref 0–0.1)
Basophils Relative: 0 %
Eosinophils Absolute: 0 10*3/uL (ref 0–0.7)
Eosinophils Relative: 1 %
HEMATOCRIT: 35.8 % — AB (ref 40.0–52.0)
Hemoglobin: 12.1 g/dL — ABNORMAL LOW (ref 13.0–18.0)
Lymphs Abs: 0.6 10*3/uL — ABNORMAL LOW (ref 1.0–3.6)
MCH: 26.8 pg (ref 26.0–34.0)
MCHC: 33.8 g/dL (ref 32.0–36.0)
MCV: 79.3 fL — AB (ref 80.0–100.0)
Monocytes Absolute: 1 10*3/uL (ref 0.2–1.0)
Monocytes Relative: 14 %
NEUTROS ABS: 5.5 10*3/uL (ref 1.4–6.5)
Neutrophils Relative %: 77 %
Platelets: 212 10*3/uL (ref 150–440)
RBC: 4.51 MIL/uL (ref 4.40–5.90)
RDW: 17.9 % — ABNORMAL HIGH (ref 11.5–14.5)
WBC: 7.1 10*3/uL (ref 3.8–10.6)

## 2016-03-12 LAB — COMPREHENSIVE METABOLIC PANEL
ALT: 16 U/L — AB (ref 17–63)
ANION GAP: 10 (ref 5–15)
AST: 59 U/L — ABNORMAL HIGH (ref 15–41)
Albumin: 2.9 g/dL — ABNORMAL LOW (ref 3.5–5.0)
Alkaline Phosphatase: 98 U/L (ref 38–126)
BUN: <5 mg/dL — ABNORMAL LOW (ref 6–20)
CHLORIDE: 82 mmol/L — AB (ref 101–111)
CO2: 31 mmol/L (ref 22–32)
Calcium: 7.7 mg/dL — ABNORMAL LOW (ref 8.9–10.3)
Creatinine, Ser: 0.82 mg/dL (ref 0.61–1.24)
GFR calc non Af Amer: >60 mL/min (ref >60)
Glucose, Bld: 107 mg/dL — ABNORMAL HIGH (ref 65–99)
POTASSIUM: 3.6 mmol/L (ref 3.5–5.1)
Sodium: 123 mmol/L — ABNORMAL LOW (ref 135–145)
Total Bilirubin: 1.7 mg/dL — ABNORMAL HIGH (ref 0.3–1.2)
Total Protein: 7.3 g/dL (ref 6.5–8.1)

## 2016-03-12 LAB — BODY FLUID CELL COUNT WITH DIFFERENTIAL
EOS FL: 0 %
Lymphs, Fluid: 28 %
MONOCYTE-MACROPHAGE-SEROUS FLUID: 24 %
Neutrophil Count, Fluid: 48 %
OTHER CELLS FL: 0 %
Total Nucleated Cell Count, Fluid: 333 cu mm

## 2016-03-12 LAB — GLUCOSE, SEROUS FLUID: Glucose, Fluid: 119 mg/dL

## 2016-03-12 LAB — LACTIC ACID, PLASMA: Lactic Acid, Venous: 1.1 mmol/L (ref 0.5–2.0)

## 2016-03-12 LAB — PROTEIN, BODY FLUID

## 2016-03-12 LAB — PROTIME-INR
INR: 1.48
PROTHROMBIN TIME: 18 s — AB (ref 11.4–15.0)

## 2016-03-12 LAB — LACTATE DEHYDROGENASE, PLEURAL OR PERITONEAL FLUID: LD, Fluid: 45 U/L — ABNORMAL HIGH (ref 3–23)

## 2016-03-12 LAB — LIPASE, BLOOD: Lipase: 22 U/L (ref 11–51)

## 2016-03-12 LAB — APTT: APTT: 38 s — AB (ref 24–36)

## 2016-03-12 MED ORDER — SODIUM CHLORIDE 0.9% FLUSH: 3.0000 mL | INTRAVENOUS | Status: DC | PRN

## 2016-03-12 MED ORDER — HEPARIN SODIUM (PORCINE) 5000 UNIT/ML IJ SOLN
5000.0000 [IU] | Freq: Three times a day (TID) | INTRAMUSCULAR | Status: DC
  Administered 2016-03-12 – 2016-03-14 (×5): 5000 [IU] via SUBCUTANEOUS
  Filled 2016-03-12 (×5): qty 1

## 2016-03-12 MED ORDER — DEXTROSE 5 % IV SOLN
1.0000 g | INTRAVENOUS | Status: AC
  Administered 2016-03-12: 1 g via INTRAVENOUS
  Filled 2016-03-12: qty 10

## 2016-03-12 MED ORDER — SPIRONOLACTONE 25 MG PO TABS
50.0000 mg | ORAL_TABLET | Freq: Every day | ORAL | Status: DC
  Administered 2016-03-13 – 2016-03-14 (×2): 50 mg via ORAL
  Filled 2016-03-12 (×2): qty 2

## 2016-03-12 MED ORDER — NADOLOL 20 MG PO TABS
20.0000 mg | ORAL_TABLET | Freq: Every day | ORAL | Status: DC
  Administered 2016-03-16 – 2016-03-17 (×2): 20 mg via ORAL
  Filled 2016-03-12 (×5): qty 1

## 2016-03-12 MED ORDER — ALBUMIN HUMAN 25 % IV SOLN: 12.5000 g | Freq: Once | INTRAVENOUS | Status: DC

## 2016-03-12 MED ORDER — ACETAMINOPHEN 325 MG PO TABS
650.0000 mg | ORAL_TABLET | Freq: Four times a day (QID) | ORAL | Status: DC | PRN
  Filled 2016-03-12: qty 2

## 2016-03-12 MED ORDER — DEXTROSE 5 % IV SOLN
1.0000 g | INTRAVENOUS | Status: DC
  Administered 2016-03-13 – 2016-03-14 (×2): 1 g via INTRAVENOUS
  Filled 2016-03-12 (×4): qty 10

## 2016-03-12 MED ORDER — ONDANSETRON HCL 4 MG/2ML IJ SOLN
4.0000 mg | Freq: Four times a day (QID) | INTRAMUSCULAR | Status: DC | PRN
  Administered 2016-03-15: 4 mg via INTRAVENOUS
  Filled 2016-03-12: qty 2

## 2016-03-12 MED ORDER — LORAZEPAM 0.5 MG PO TABS
0.5000 mg | ORAL_TABLET | Freq: Three times a day (TID) | ORAL | Status: DC
  Administered 2016-03-12 – 2016-03-17 (×15): 0.5 mg via ORAL
  Filled 2016-03-12 (×15): qty 1

## 2016-03-12 MED ORDER — VANCOMYCIN HCL IN DEXTROSE 1-5 GM/200ML-% IV SOLN
1000.0000 mg | Freq: Once | INTRAVENOUS | Status: AC
  Administered 2016-03-12: 1000 mg via INTRAVENOUS
  Filled 2016-03-12: qty 200

## 2016-03-12 MED ORDER — OXYCODONE HCL 5 MG PO TABS
5.0000 mg | ORAL_TABLET | ORAL | Status: DC | PRN
  Administered 2016-03-15 – 2016-03-16 (×3): 5 mg via ORAL
  Filled 2016-03-12 (×3): qty 1

## 2016-03-12 MED ORDER — SODIUM CHLORIDE 0.9 % IV SOLN: 250.0000 mL | INTRAVENOUS | Status: DC | PRN

## 2016-03-12 MED ORDER — SIMETHICONE 80 MG PO CHEW
160.0000 mg | CHEWABLE_TABLET | Freq: Four times a day (QID) | ORAL | Status: DC | PRN
  Administered 2016-03-16: 240 mg via ORAL
  Filled 2016-03-12: qty 4

## 2016-03-12 MED ORDER — ZOLPIDEM TARTRATE 5 MG PO TABS
10.0000 mg | ORAL_TABLET | Freq: Every evening | ORAL | Status: DC | PRN
  Administered 2016-03-13: 10 mg via ORAL
  Filled 2016-03-12: qty 2

## 2016-03-12 MED ORDER — ALBUMIN HUMAN 25 % IV SOLN
25.0000 g | Freq: Once | INTRAVENOUS | Status: AC
Start: 2016-03-12 — End: 2016-03-12
  Administered 2016-03-12: 25 g via INTRAVENOUS
  Filled 2016-03-12: qty 100

## 2016-03-12 MED ORDER — PANTOPRAZOLE SODIUM 40 MG PO TBEC
40.0000 mg | DELAYED_RELEASE_TABLET | Freq: Every day | ORAL | Status: DC
  Administered 2016-03-13 – 2016-03-17 (×5): 40 mg via ORAL
  Filled 2016-03-12 (×5): qty 1

## 2016-03-12 MED ORDER — FUROSEMIDE 20 MG PO TABS: 20.0000 mg | ORAL_TABLET | Freq: Two times a day (BID) | ORAL | Status: DC

## 2016-03-12 MED ORDER — SODIUM CHLORIDE 0.9% FLUSH
3.0000 mL | Freq: Two times a day (BID) | INTRAVENOUS | Status: DC
  Administered 2016-03-12 – 2016-03-14 (×4): 3 mL via INTRAVENOUS

## 2016-03-12 MED ORDER — ACETAMINOPHEN 650 MG RE SUPP: 650.0000 mg | Freq: Four times a day (QID) | RECTAL | Status: DC | PRN

## 2016-03-12 MED ORDER — ONDANSETRON HCL 4 MG PO TABS: 4.0000 mg | ORAL_TABLET | Freq: Four times a day (QID) | ORAL | Status: DC | PRN

## 2016-03-12 MED ORDER — ONDANSETRON 4 MG PO TBDP: 4.0000 mg | ORAL_TABLET | Freq: Three times a day (TID) | ORAL | Status: DC | PRN

## 2016-03-12 NOTE — ED Provider Notes (Signed)
[facility] Emergency Department Provider Note  ____________________________________________  Time seen: Approximately 7:26 PM  I have reviewed the triage vital signs and the nursing notes.   HISTORY  Chief Complaint umbilical drainage     HPI Jeff Preston is a 70 y.o. male with chronic liver failure and chronic side disease requiring monthly small-volume paracentesis who presents with heavy drainage from his umbilicus of what appears to be ascitic fluid.  He says it started as a very slow leak yesterday evening and he did not think much about it but then it increased gradually over the course of the last nearly 24 hours.  After sitting down to have a bowel movement he stood up and reports that fluid erupted from his belly button and has been draining heavily since then.  The fluid outflow is described as severe.  He has no abdominal pain and denies fever/chills, chest pain, shortness of breath, nausea, vomiting, diarrhea.  He is alert and oriented and does not appear to have any altered mental status.  The patient's GI doctor is Dr. Mechele Collin.  Past Medical History  Diagnosis Date  . Hypertension   . Depression   . GERD (gastroesophageal reflux disease)   . Cirrhosis (HCC)   . Anxiety     There are no active problems to display for this patient.   Past Surgical History  Procedure Laterality Date  . Throat surgery    . Cholecystectomy      Current Outpatient Rx  Name  Route  Sig  Dispense  Refill  . furosemide (LASIX) 20 MG tablet   Oral   Take 20 mg by mouth 2 (two) times daily.          Marland Kitchen LORazepam (ATIVAN) 0.5 MG tablet   Oral   Take 0.5 mg by mouth 3 (three) times daily.          . nadolol (CORGARD) 20 MG tablet   Oral   Take 20 mg by mouth daily.         Marland Kitchen omeprazole (PRILOSEC) 20 MG capsule   Oral   Take 20 mg by mouth 2 (two) times daily.          . ondansetron (ZOFRAN-ODT) 4 MG disintegrating tablet   Oral   Take  4 mg by mouth every 8 (eight) hours as needed for nausea or vomiting.         . simethicone (MYLICON) 80 MG chewable tablet   Oral   Chew 160-240 mg by mouth every 6 (six) hours as needed for flatulence.          Marland Kitchen spironolactone (ALDACTONE) 50 MG tablet   Oral   Take 50 mg by mouth daily.         Marland Kitchen zolpidem (AMBIEN) 10 MG tablet   Oral   Take 10 mg by mouth at bedtime as needed for sleep.           Allergies Dilantin and Penicillins  Family History  Problem Relation Age of Onset  . CAD Mother     Social History Social History  Substance Use Topics  . Smoking status: Former Games developer  . Smokeless tobacco: None  . Alcohol Use: No    Review of Systems Constitutional: No fever/chills Eyes: No visual changes. ENT: No sore throat. Cardiovascular: Denies chest pain. Respiratory: Denies shortness of breath. Gastrointestinal: Ascites actively draining from umbilicus in heavy stream.  No abdominal pain.  No nausea, no vomiting.  No diarrhea.  No constipation. Genitourinary: Negative for dysuria. Musculoskeletal: Negative for back pain. Skin: Negative for rash. Neurological: Negative for headaches, focal weakness or numbness.  10-point ROS otherwise negative.  ____________________________________________   PHYSICAL EXAM:  VITAL SIGNS: ED Triage Vitals  Enc Vitals Group     BP 03/12/16 1824 141/64 mmHg     Pulse Rate 03/12/16 1824 82     Resp 03/12/16 1824 18     Temp 03/12/16 1824 97.7 F (36.5 C)     Temp Source 03/12/16 1824 Oral     SpO2 03/12/16 1824 96 %     Weight 03/12/16 1824 220 lb (99.791 kg)     Height 03/12/16 1824  (1.702 m)     Head Cir --      Peak Flow --      Pain Score --      Pain Loc --      Pain Edu? --      Excl. in GC? --     Constitutional: Alert and oriented. Appearance of chronic illness. Eyes: Conjunctivae are normal. PERRL. EOMI. Head: Atraumatic. Nose: No congestion/rhinnorhea. Mouth/Throat: Mucous membranes are  moist.  Oropharynx non-erythematous. Neck: No stridor.  No meningeal signs.   Cardiovascular: Normal rate, regular rhythm. Good peripheral circulation. Grossly normal heart sounds.   Respiratory: Normal respiratory effort.  No retractions. Lungs CTAB. Gastrointestinal: Tense abdominal with ascites.  When towels are removed from umbilicus, a thick arc of ascites begins to stream from a hole in the umbilicus and small umbilical hernia.  Non-tender abdomen. Musculoskeletal: No lower extremity tenderness nor edema. No gross deformities of extremities. Neurologic:  Normal speech and language. No gross focal neurologic deficits are appreciated.  Skin:  Skin is warm, dry and intact. Pale and jaundice. Psychiatric: Mood and affect are normal. Speech and behavior are normal.  ____________________________________________   LABS (all labs ordered are listed, but only abnormal results are displayed)  Labs Reviewed  CBC WITH DIFFERENTIAL/PLATELET - Abnormal; Notable for the following:    Hemoglobin 12.1 (*)    HCT 35.8 (*)    MCV 79.3 (*)    RDW 17.9 (*)    Lymphs Abs 0.6 (*)    All other components within normal limits  COMPREHENSIVE METABOLIC PANEL - Abnormal; Notable for the following:    Sodium 123 (*)    Chloride 82 (*)    Glucose, Bld 107 (*)    BUN <5 (*)    Calcium 7.7 (*)    Albumin 2.9 (*)    AST 59 (*)    ALT 16 (*)    Total Bilirubin 1.7 (*)    All other components within normal limits  PROTIME-INR - Abnormal; Notable for the following:    Prothrombin Time 18.0 (*)    All other components within normal limits  APTT - Abnormal; Notable for the following:    aPTT 38 (*)    All other components within normal limits  BODY FLUID CULTURE  LIPASE, BLOOD  LACTIC ACID, PLASMA  BODY FLUID CELL COUNT WITH DIFFERENTIAL  LACTATE DEHYDROGENASE, BODY FLUID  PROTEIN, BODY FLUID  GLUCOSE, SEROUS FLUID    ____________________________________________  EKG  None ____________________________________________  RADIOLOGY   No results found.  ____________________________________________   PROCEDURES  Procedure(s) performed: None  Critical Care performed: No ____________________________________________   INITIAL IMPRESSION / ASSESSMENT AND PLAN / ED COURSE  Pertinent labs & imaging results that were available during my care of the patient were reviewed by me and considered in  my medical decision making (see chart for details).  I spoke with the on-call interventional radiologist to see if he was available for performing a paracentesis.  Unfortunately they are not available for this particular procedure since it is nonemergent.  Given the fact that the patient is chronically ill and has an open communication between the outside and his ascites and is at high risk of developing infection, I will treat him empirically with antibiotics (third generation cephalosporin as well as vancomycin) and discussed the case with the hospitalist for observation until tomorrow when he can be evaluated by interventional radiology.  Given the rate at which the fluid is draining, I discussed dosing with albumin to replete given his active drainage in his low baseline.  Hospitalist will place appropriate order.  I am also sending ascites samples for standard body fluid testing (culture, cell count, protein, glucose, LDH).  These were collected prior to antibiotic treatment.  ____________________________________________  FINAL CLINICAL IMPRESSION(S) / ED DIAGNOSES  Final diagnoses:  Ascites  Open wound of umbilical region, initial encounter     MEDICATIONS GIVEN DURING THIS VISIT:  Medications  cefTRIAXone (ROCEPHIN) 1 g in dextrose 5 % 50 mL IVPB (not administered)  vancomycin (VANCOCIN) IVPB 1000 mg/200 mL premix (not administered)     NEW OUTPATIENT MEDICATIONS STARTED DURING THIS  VISIT:  New Prescriptions   No medications on file      Note:  This document was prepared using Dragon voice recognition software and may include unintentional dictation errors.   Loleta Roseory Laquanta Hummel, MD 03/12/16 2008

## 2016-03-12 NOTE — H&P (Signed)
[facility] [place] at [facility]   PATIENT NAME: Jeff Preston    MR#:  [id number]  DATE OF BIRTH:  1946/03/11  DATE OF ADMISSION:  03/12/2016  PRIMARY CARE PHYSICIAN: No PCP Per Patient   REQUESTING/REFERRING PHYSICIAN: Loleta Rose MD  CHIEF COMPLAINT:   Chief Complaint  Patient presents with  . umbilical drainage     HISTORY OF PRESENT ILLNESS: Jeff Preston  is a 70 y.o. male with a known history of  Liver cirrhosis, ascites, hypertension, depression, GERD who presents with drainage of ascites from his umbilicus. Patient reports that it started a few days ago but it was little. He routinely gets paracentesis. Last paracentesis was 3 weeks ago. He is supposed to get another paracentesis next week. The drainage started to increase and now is draining a lot of ascitic fluid. Patient denies any abdominal pain nausea vomiting or diarrhea no fevers.  PAST MEDICAL HISTORY:   Past Medical History  Diagnosis Date  . Hypertension   . Depression   . GERD (gastroesophageal reflux disease)   . Cirrhosis (HCC)   . Anxiety     PAST SURGICAL HISTORY: Past Surgical History  Procedure Laterality Date  . Throat surgery    . Cholecystectomy      SOCIAL HISTORY:  Social History  Substance Use Topics  . Smoking status: Former Games developer  . Smokeless tobacco: Not on file  . Alcohol Use: No    FAMILY HISTORY:  Family History  Problem Relation Age of Onset  . CAD Mother     DRUG ALLERGIES:  Allergies  Allergen Reactions  . Dilantin [Phenytoin Sodium Extended] Rash  . Penicillins Rash and Other (See Comments)    Has patient had a PCN reaction causing immediate rash, facial/tongue/throat swelling, SOB or lightheadedness with hypotension: No Has patient had a PCN reaction causing severe rash involving mucus membranes or skin necrosis: No Has patient had a PCN reaction that required hospitalization No Has patient had a PCN reaction occurring within the last 10  years: No If all of the above answers are "NO", then may proceed with Cephalosporin use.    REVIEW OF SYSTEMS:   CONSTITUTIONAL: No fever, fatigue or weakness.  EYES: No blurred or double vision.  EARS, NOSE, AND THROAT: No tinnitus or ear pain.  RESPIRATORY: No cough, shortness of breath, wheezing or hemoptysis.  CARDIOVASCULAR: No chest pain, orthopnea, edema.  GASTROINTESTINAL: No nausea, vomiting, diarrhea or No abdominal pain.  GENITOURINARY: No dysuria, hematuria.  ENDOCRINE: No polyuria, nocturia,  HEMATOLOGY: No anemia, easy bruising or bleeding SKIN: No rash or lesion. MUSCULOSKELETAL: No joint pain or arthritis.   NEUROLOGIC: No tingling, numbness, weakness.  PSYCHIATRY: No anxiety or depression.   MEDICATIONS AT HOME:  Prior to Admission medications   Medication Sig Start Date End Date Taking? Authorizing Provider  furosemide (LASIX) 20 MG tablet Take 20 mg by mouth 2 (two) times daily.    Yes Historical Provider, MD  LORazepam (ATIVAN) 0.5 MG tablet Take 0.5 mg by mouth 3 (three) times daily.    Yes Historical Provider, MD  nadolol (CORGARD) 20 MG tablet Take 20 mg by mouth daily.   Yes Historical Provider, MD  omeprazole (PRILOSEC) 20 MG capsule Take 20 mg by mouth 2 (two) times daily.    Yes Historical Provider, MD  ondansetron (ZOFRAN-ODT) 4 MG disintegrating tablet Take 4 mg by mouth every 8 (eight) hours as needed for nausea or vomiting.   Yes Historical Provider, MD  simethicone (  MYLICON) 80 MG chewable tablet Chew 160-240 mg by mouth every 6 (six) hours as needed for flatulence.    Yes Historical Provider, MD  spironolactone (ALDACTONE) 50 MG tablet Take 50 mg by mouth daily.   Yes Historical Provider, MD  zolpidem (AMBIEN) 10 MG tablet Take 10 mg by mouth at bedtime as needed for sleep.   Yes Historical Provider, MD      PHYSICAL EXAMINATION:   VITAL SIGNS: Blood pressure 141/64, pulse 82, temperature 97.7 F (36.5 C), temperature source Oral, resp. rate 18,  height 5\' 7"  (1.702 m), weight 99.791 kg (220 lb), SpO2 96 %.  GENERAL:  70 y.o.-year-old patient lying in the bed with no acute distress.  EYES: Pupils equal, round, reactive to light and accommodation. No scleral icterus. Extraocular muscles intact.  HEENT: Head atraumatic, normocephalic. Oropharynx and nasopharynx clear.  NECK:  Supple, no jugular venous distention. No thyroid enlargement, no tenderness.  LUNGS: Normal breath sounds bilaterally, no wheezing, rales,rhonchi or crepitation. No use of accessory muscles of respiration.  CARDIOVASCULAR: S1, S2 normal. No murmurs, rubs, or gallops.  ABDOMEN: Soft, nontender, distended has drainage from like umbilicus Bowel sounds present. No organomegaly or mass.  EXTREMITIES: No pedal edema, cyanosis, or clubbing.  NEUROLOGIC: Cranial nerves II through XII are intact. Muscle strength 5/5 in all extremities. Sensation intact. Gait not checked.  PSYCHIATRIC: The patient is alert and oriented x 3.  SKIN: No obvious rash, lesion, or ulcer.   LABORATORY PANEL:   CBC  Recent Labs Lab 03/12/16 1852  WBC 7.1  HGB 12.1*  HCT 35.8*  PLT 212  MCV 79.3*  MCH 26.8  MCHC 33.8  RDW 17.9*  LYMPHSABS 0.6*  MONOABS 1.0  EOSABS 0.0  BASOSABS 0.0   ------------------------------------------------------------------------------------------------------------------  Chemistries   Recent Labs Lab 03/12/16 1852  NA 123*  K 3.6  CL 82*  CO2 31  GLUCOSE 107*  BUN <5*  CREATININE 0.82  CALCIUM 7.7*  AST 59*  ALT 16*  ALKPHOS 98  BILITOT 1.7*   ------------------------------------------------------------------------------------------------------------------ estimated creatinine clearance is 97.1 mL/min (by C-G formula based on Cr of 0.82). ------------------------------------------------------------------------------------------------------------------ No results for input(s): TSH, T4TOTAL, T3FREE, THYROIDAB in the last 72 hours.  Invalid  input(s): FREET3   Coagulation profile  Recent Labs Lab 03/12/16 1852  INR 1.48   ------------------------------------------------------------------------------------------------------------------- No results for input(s): DDIMER in the last 72 hours. -------------------------------------------------------------------------------------------------------------------  Cardiac Enzymes No results for input(s): CKMB, TROPONINI, MYOGLOBIN in the last 168 hours.  Invalid input(s): CK ------------------------------------------------------------------------------------------------------------------ Invalid input(s): POCBNP  ---------------------------------------------------------------------------------------------------------------  Urinalysis    Component Value Date/Time   COLORURINE STRAW* 07/22/2015 1042   COLORURINE Yellow 03/05/2015 0148   APPEARANCEUR CLEAR* 07/22/2015 1042   APPEARANCEUR Clear 03/05/2015 0148   LABSPEC 1.002* 07/22/2015 1042   LABSPEC 1.002 03/05/2015 0148   PHURINE 7.0 07/22/2015 1042   PHURINE 8.0 03/05/2015 0148   GLUCOSEU NEGATIVE 07/22/2015 1042   GLUCOSEU Negative 03/05/2015 0148   HGBUR NEGATIVE 07/22/2015 1042   HGBUR Negative 03/05/2015 0148   BILIRUBINUR NEGATIVE 07/22/2015 1042   BILIRUBINUR Negative 03/05/2015 0148   KETONESUR NEGATIVE 07/22/2015 1042   KETONESUR Negative 03/05/2015 0148   PROTEINUR NEGATIVE 07/22/2015 1042   PROTEINUR Negative 03/05/2015 0148   NITRITE NEGATIVE 07/22/2015 1042   NITRITE Negative 03/05/2015 0148   LEUKOCYTESUR NEGATIVE 07/22/2015 1042   LEUKOCYTESUR Negative 03/05/2015 0148     RADIOLOGY: No results found.  EKG: Orders placed or performed in visit on 05/21/14  . EKG 12-Lead  IMPRESSION AND PLAN: Patient is a 70 year old white male with liver cirrhosis with ascites  1. Ascites with drainage from his umbilicus; we will have radiology do paracentesis tomorrow Once the pressure is relieved he  should stop draining I will give him a dose of albumin 1 today  2. Hyponatremia likely related to his liver cirrhosis continue to monitor  3 hypertension in light of patient losing fluid I will hold his blood pressure medications  4. Anxiety we'll continue Ativan as taking at home  5. GERD continue PPI  6. Miscellaneous heparin for DVT    All the records are reviewed and case discussed with ED provider. Management plans discussed with the patient, family and they are in agreement.  CODE STATUS:    Code Status Orders        Start     Ordered   03/12/16 2009  Full code   Continuous     03/12/16 2008    Code Status History    Date Active Date Inactive Code Status Order ID Comments User Context   This patient has a current code status but no historical code status.       TOTAL TIME TAKING CARE OF THIS PATIENT: 55 minutes.    Auburn BilberryPATEL, Gwyn Mehring M.D on 03/12/2016 at 8:15 PM  Between 7am to 6pm - Pager - [phone]  After 6pm go to [email or web address] - password [email or web address] [id number]  [facility]  Office  [phone]  CC: Primary care physician; No PCP Per Patient

## 2016-03-12 NOTE — ED Notes (Signed)
Patient has history of ascites and has paracentesis performed once monthly.  Patient draining serous fluid from umbilicus.  He has soaked through multiple towels and blankets while holding pressure to the umbilicus.  Patient still in NAD at this time.

## 2016-03-12 NOTE — ED Notes (Signed)
Pt comes into the ED via EMS from home c/o drainage from his umbilicus.  Patient states he gets drained once a month and is due to have it done next week.  Patient has serous drainage leaking from umbilicus and drainage is uncontrolled at this present time.  Patient denies any abdominal pain, dizziness, chest pain.  Patient explains that it started off as mild drainage and then when he went to the bathroom it became uncontrollable.  H/o cirrhosis of the liver, large scrotal hernia, anxiety, depression, and HTN.  105 CBG, 98% room air, 150/70, 82 HR. Patient in NAD at this time and A&Ox4.

## 2016-03-13 ENCOUNTER — Observation Stay: Payer: Medicare Other

## 2016-03-13 DIAGNOSIS — H1132 Conjunctival hemorrhage, left eye: Secondary | ICD-10-CM | POA: Diagnosis not present

## 2016-03-13 DIAGNOSIS — K652 Spontaneous bacterial peritonitis: Secondary | ICD-10-CM | POA: Diagnosis not present

## 2016-03-13 DIAGNOSIS — F419 Anxiety disorder, unspecified: Secondary | ICD-10-CM | POA: Diagnosis present

## 2016-03-13 DIAGNOSIS — S31105A Unspecified open wound of abdominal wall, periumbilic region without penetration into peritoneal cavity, initial encounter: Secondary | ICD-10-CM | POA: Diagnosis not present

## 2016-03-13 DIAGNOSIS — K219 Gastro-esophageal reflux disease without esophagitis: Secondary | ICD-10-CM | POA: Diagnosis present

## 2016-03-13 DIAGNOSIS — K421 Umbilical hernia with gangrene: Secondary | ICD-10-CM | POA: Diagnosis present

## 2016-03-13 DIAGNOSIS — E876 Hypokalemia: Secondary | ICD-10-CM | POA: Diagnosis not present

## 2016-03-13 DIAGNOSIS — K7210 Chronic hepatic failure without coma: Secondary | ICD-10-CM | POA: Diagnosis present

## 2016-03-13 DIAGNOSIS — Z66 Do not resuscitate: Secondary | ICD-10-CM | POA: Diagnosis present

## 2016-03-13 DIAGNOSIS — E8809 Other disorders of plasma-protein metabolism, not elsewhere classified: Secondary | ICD-10-CM | POA: Diagnosis present

## 2016-03-13 DIAGNOSIS — R188 Other ascites: Secondary | ICD-10-CM | POA: Diagnosis not present

## 2016-03-13 DIAGNOSIS — Z79899 Other long term (current) drug therapy: Secondary | ICD-10-CM | POA: Diagnosis not present

## 2016-03-13 DIAGNOSIS — F1020 Alcohol dependence, uncomplicated: Secondary | ICD-10-CM | POA: Diagnosis present

## 2016-03-13 DIAGNOSIS — B9561 Methicillin susceptible Staphylococcus aureus infection as the cause of diseases classified elsewhere: Secondary | ICD-10-CM | POA: Diagnosis present

## 2016-03-13 DIAGNOSIS — I10 Essential (primary) hypertension: Secondary | ICD-10-CM | POA: Diagnosis present

## 2016-03-13 DIAGNOSIS — H109 Unspecified conjunctivitis: Secondary | ICD-10-CM | POA: Diagnosis not present

## 2016-03-13 DIAGNOSIS — D684 Acquired coagulation factor deficiency: Secondary | ICD-10-CM | POA: Diagnosis present

## 2016-03-13 DIAGNOSIS — E871 Hypo-osmolality and hyponatremia: Secondary | ICD-10-CM | POA: Diagnosis present

## 2016-03-13 DIAGNOSIS — Z87891 Personal history of nicotine dependence: Secondary | ICD-10-CM | POA: Diagnosis not present

## 2016-03-13 DIAGNOSIS — K7031 Alcoholic cirrhosis of liver with ascites: Secondary | ICD-10-CM | POA: Diagnosis present

## 2016-03-13 DIAGNOSIS — S31105D Unspecified open wound of abdominal wall, periumbilic region without penetration into peritoneal cavity, subsequent encounter: Secondary | ICD-10-CM | POA: Diagnosis not present

## 2016-03-13 DIAGNOSIS — K429 Umbilical hernia without obstruction or gangrene: Secondary | ICD-10-CM | POA: Diagnosis not present

## 2016-03-13 LAB — BASIC METABOLIC PANEL
ANION GAP: 10 (ref 5–15)
CALCIUM: 7.5 mg/dL — AB (ref 8.9–10.3)
CHLORIDE: 87 mmol/L — AB (ref 101–111)
CO2: 30 mmol/L (ref 22–32)
CREATININE: 0.8 mg/dL (ref 0.61–1.24)
GFR calc Af Amer: >60 mL/min (ref >60)
GFR calc non Af Amer: >60 mL/min (ref >60)
GLUCOSE: 99 mg/dL (ref 65–99)
POTASSIUM: 2.7 mmol/L — AB (ref 3.5–5.1)
Sodium: 127 mmol/L — ABNORMAL LOW (ref 135–145)

## 2016-03-13 LAB — PATHOLOGIST SMEAR REVIEW

## 2016-03-13 LAB — CBC
HEMATOCRIT: 31.2 % — AB (ref 40.0–52.0)
HEMOGLOBIN: 10.5 g/dL — AB (ref 13.0–18.0)
MCH: 26.3 pg (ref 26.0–34.0)
MCHC: 33.8 g/dL (ref 32.0–36.0)
MCV: 77.9 fL — AB (ref 80.0–100.0)
Platelets: 168 10*3/uL (ref 150–440)
RBC: 4.01 MIL/uL — AB (ref 4.40–5.90)
RDW: 18.4 % — ABNORMAL HIGH (ref 11.5–14.5)
WBC: 4.2 10*3/uL (ref 3.8–10.6)

## 2016-03-13 LAB — POTASSIUM: Potassium: 3.6 mmol/L (ref 3.5–5.1)

## 2016-03-13 LAB — PHOSPHORUS: Phosphorus: 2.4 mg/dL — ABNORMAL LOW (ref 2.5–4.6)

## 2016-03-13 LAB — MAGNESIUM: MAGNESIUM: 1.9 mg/dL (ref 1.7–2.4)

## 2016-03-13 MED ORDER — POTASSIUM CHLORIDE CRYS ER 20 MEQ PO TBCR: 20.0000 meq | EXTENDED_RELEASE_TABLET | Freq: Every day | ORAL | Status: DC

## 2016-03-13 MED ORDER — POTASSIUM CHLORIDE CRYS ER 20 MEQ PO TBCR
40.0000 meq | EXTENDED_RELEASE_TABLET | Freq: Every day | ORAL | Status: DC
  Administered 2016-03-14: 40 meq via ORAL
  Filled 2016-03-13: qty 2

## 2016-03-13 MED ORDER — POTASSIUM CHLORIDE CRYS ER 20 MEQ PO TBCR
40.0000 meq | EXTENDED_RELEASE_TABLET | ORAL | Status: AC
Start: 2016-03-13 — End: 2016-03-13
  Administered 2016-03-13 (×2): 40 meq via ORAL
  Filled 2016-03-13 (×3): qty 2

## 2016-03-13 MED ORDER — POTASSIUM CHLORIDE CRYS ER 20 MEQ PO TBCR
40.0000 meq | EXTENDED_RELEASE_TABLET | Freq: Four times a day (QID) | ORAL | Status: DC
  Administered 2016-03-13 (×2): 40 meq via ORAL
  Filled 2016-03-13: qty 2

## 2016-03-13 MED ORDER — ZOLPIDEM TARTRATE 5 MG PO TABS
5.0000 mg | ORAL_TABLET | Freq: Every evening | ORAL | Status: DC | PRN
  Administered 2016-03-13 – 2016-03-14 (×2): 5 mg via ORAL
  Filled 2016-03-13 (×2): qty 1

## 2016-03-13 MED ORDER — SULFAMETHOXAZOLE-TRIMETHOPRIM 800-160 MG PO TABS: 1.0000 | ORAL_TABLET | Freq: Two times a day (BID) | ORAL | Status: DC

## 2016-03-13 NOTE — Progress Notes (Addendum)
[facility] -  at [facility]   PATIENT NAME: Jeff Preston    MR#:  [id number]  DATE OF BIRTH:  [birth year]  SUBJECTIVE:  CHIEF COMPLAINT:   Chief Complaint  Patient presents with  . umbilical drainage   Patient is 70 year old Caucasian male with a past medical history significant for history of alcoholic liver cirrhosis, ascites, essential hypertension, depression, gastroesophageal reflux disease who presented to the hospital with drainage from his umbilicus, which started approximately a few days ago. His last paracentesis was about 3 weeks ago. He was supposed to get another paracentesis next week. However, due to increased drainage. He decided to come to emergency room for further evaluation and therapy. Patient denies any fevers, abdominal pain at present. Patient underwent ultrasound of abdomen which revealed a spontaneous rupture of umbilical hernia and minimal amount of ascites, surgical consultation was obtained, however, upon discussion with Dr. Excell Seltzer. It appears that umbilical hernia rupture is almost always fatal. Consultation with palliative care was obtained, pending  Review of Systems  Constitutional: Negative for fever, chills and weight loss.  HENT: Negative for congestion.   Eyes: Negative for blurred vision and double vision.  Respiratory: Negative for cough, sputum production, shortness of breath and wheezing.   Cardiovascular: Negative for chest pain, palpitations, orthopnea, leg swelling and PND.  Gastrointestinal: Negative for nausea, vomiting, abdominal pain, diarrhea, constipation and blood in stool.  Genitourinary: Negative for dysuria, urgency, frequency and hematuria.  Musculoskeletal: Negative for falls.  Neurological: Negative for dizziness, tremors, focal weakness and headaches.  Endo/Heme/Allergies: Does not bruise/bleed easily.  Psychiatric/Behavioral: Negative for depression. The patient does not have insomnia.     VITAL  SIGNS: Blood pressure 110/52, pulse 83, temperature 97.8 F (36.6 C), temperature source Oral, resp. rate 18, height  (1.702 m), weight 86.955 kg (191 lb 11.2 oz), SpO2 97 %.  PHYSICAL EXAMINATION:   GENERAL:  70 y.o.-year-old patient lying in the bed with no acute distress.  EYES: Pupils equal, round, reactive to light and accommodation. No scleral icterus. Extraocular muscles intact.  HEENT: Head atraumatic, normocephalic. Oropharynx and nasopharynx clear.  NECK:  Supple, no jugular venous distention. No thyroid enlargement, no tenderness.  LUNGS: Normal breath sounds bilaterally, no wheezing, rales,rhonchi or crepitation. No use of accessory muscles of respiration.  CARDIOVASCULAR: S1, S2 normal. No murmurs, rubs, or gallops.  ABDOMEN: Soft, nontender, nondistended. Bowel sounds present. No organomegaly or mass. Small umbilical hernia was noted with scab, upon spontaneous opening it  drains peritoneal fluid , no obvious purulence  EXTREMITIES: No pedal edema, cyanosis, or clubbing.  NEUROLOGIC: Cranial nerves II through XII are intact. Muscle strength 5/5 in all extremities. Sensation intact. Gait not checked.  PSYCHIATRIC: The patient is alert and oriented x 3.  SKIN: No obvious rash, lesion, or ulcer.   ORDERS/RESULTS REVIEWED:   CBC  Recent Labs Lab 03/12/16 1852 03/13/16 0413  WBC 7.1 4.2  HGB 12.1* 10.5*  HCT 35.8* 31.2*  PLT 212 168  MCV 79.3* 77.9*  MCH 26.8 26.3  MCHC 33.8 33.8  RDW 17.9* 18.4*  LYMPHSABS 0.6*  --   MONOABS 1.0  --   EOSABS 0.0  --   BASOSABS 0.0  --    ------------------------------------------------------------------------------------------------------------------  Chemistries   Recent Labs Lab 03/12/16 1852 03/13/16 0413  NA 123* 127*  K 3.6 2.7*  CL 82* 87*  CO2 31 30  GLUCOSE 107* 99  BUN <5* <5*  CREATININE 0.82 0.80  CALCIUM 7.7* 7.5*  MG  --  1.9  AST 59*  --   ALT 16*  --   ALKPHOS 98  --   BILITOT 1.7*  --     ------------------------------------------------------------------------------------------------------------------ estimated creatinine clearance is 93.1 mL/min (by C-G formula based on Cr of 0.8). ------------------------------------------------------------------------------------------------------------------ No results for input(s): TSH, T4TOTAL, T3FREE, THYROIDAB in the last 72 hours.  Invalid input(s): FREET3  Cardiac Enzymes No results for input(s): CKMB, TROPONINI, MYOGLOBIN in the last 168 hours.  Invalid input(s): CK ------------------------------------------------------------------------------------------------------------------ Invalid input(s): POCBNP ---------------------------------------------------------------------------------------------------------------  RADIOLOGY: [place] Abdomen Limited  03/13/2016  CLINICAL DATA:  70 year old male with a history of recurrent ascites. The patient presents to the emergency department with spontaneous drainage. He has been referred for paracentesis. EXAM: US ABDOMEN LIMITED - RIGHT UPPER QUADRANT COMPARISON:  None. FINDINGS: Ultrasound performed of the abdomen to identify a potential access for paracentesis. No ascites visualized. On physical exam, the patient appears to have spontaneous rupture of an umbilical hernia, with apparent trans cutaneous communication to the umbilical hernia sac. IMPRESSION: No significant ascites within the abdomen. On physical exam, the patient appears to have a spontaneous rupture of an umbilical hernia. Signed, Yvone NeuJaime S. Loreta AveWagner, DO [facility] Electronically Signed   By: Gilmer MorJaime  Wagner D.O.   On: 03/13/2016 10:39    EKG:  Orders placed or performed in visit on 05/21/14  . EKG 12-Lead    ASSESSMENT AND PLAN:  Active Problems:   Ascites #1. Umbilical hernia rupture due to gangrenous/erosed  tip, discussed with Dr. Excell Seltzerooper, surgeon, who felt that this  condition is almost uniformly fatal, palliative care consultation is requested, now on vancomycin and Rocephin  #2. Ascites due to alcohol liver disease, ongoing alcohol abuse, not much ascites is seen on ultrasound of abdomen due to spontaneous leakage #3. Hyponatremia, improved on IV fluid administration, follow closely #4. Coagulopathy, due to alcohol related liver disease, no obvious bleeding, although hemoglobin dropped down to about one half a gram, follow in the morning #5. Hypokalemia, supplementing intravenously, orally, magnesium level is low normal   Management plans discussed with the patient, family and they are in agreement.   DRUG ALLERGIES:  Allergies  Allergen Reactions  . Dilantin [Phenytoin Sodium Extended] Rash  . Penicillins Rash and Other (See Comments)    Has patient had a PCN reaction causing immediate rash, facial/tongue/throat swelling, SOB or lightheadedness with hypotension: No Has patient had a PCN reaction causing severe rash involving mucus membranes or skin necrosis: No Has patient had a PCN reaction that required hospitalization No Has patient had a PCN reaction occurring within the last 10 years: No If all of the above answers are "NO", then may proceed with Cephalosporin use.    CODE STATUS:     Code Status Orders        Start     Ordered   03/12/16 2009  Full code   Continuous     03/12/16 2008    Code Status History    Date Active Date Inactive Code Status Order ID Comments User Context   This patient has a current code status but no historical code status.      TOTAL TIME TAKING CARE OF THIS PATIENT: 40  minutes.  Discussed with Dr. Olivia Canterooper  Marlette Curvin M.D on 03/13/2016 at 12:28 PM  Between 7am to 6pm - Pager - [phone]  After 6pm go to [email or web address] - password [email or web address] [facility]  [facility]  Office  [phone]  CC: Primary care physician; No PCP Per Patient

## 2016-03-13 NOTE — Consult Note (Signed)
Surgical Consultation  03/13/2016  Jeff Preston is an 70 y.o. male.   CC: Periumbilical drainage  HPI: This patient with an ascites leak through an umbilical hernia . He has no abdominal pain. He's had multiple paracentesis recently and a recent attempt at a paracentesis failed to identify a significant amount of ascites for drainage. Then on medical management for alcoholic cirrhosis and ascites.  Past Medical History  Diagnosis Date  . Hypertension   . Depression   . GERD (gastroesophageal reflux disease)   . Cirrhosis ([place])   . Anxiety     Past Surgical History  Procedure Laterality Date  . Throat surgery    . Cholecystectomy      Family History  Problem Relation Age of Onset  . CAD Mother     Social History:  reports that he has quit smoking. He does not have any smokeless tobacco history on file. He reports that he does not drink alcohol or use illicit drugs.  Allergies:  Allergies  Allergen Reactions  . Dilantin [Phenytoin Sodium Extended] Rash  . Penicillins Rash and Other (See Comments)    Has patient had a PCN reaction causing immediate rash, facial/tongue/throat swelling, SOB or lightheadedness with hypotension: No Has patient had a PCN reaction causing severe rash involving mucus membranes or skin necrosis: No Has patient had a PCN reaction that required hospitalization No Has patient had a PCN reaction occurring within the last 10 years: No If all of the above answers are "NO", then may proceed with Cephalosporin use.    Medications reviewed.   Review of Systems:   Review of Systems  Constitutional: Negative for fever and chills.  HENT: Negative.   Eyes: Negative.   Respiratory: Negative.   Cardiovascular: Negative.   Gastrointestinal: Negative for heartburn, nausea, vomiting and abdominal pain.  Genitourinary: Negative.   Musculoskeletal: Negative.   Skin: Negative.      Physical Exam:  BP 110/53 mmHg  Pulse 79  Temp(Src) 97.5 F (36.4  C) (Oral)  Resp 20  Ht 5' 7"  (1.702 m)  Wt 191 lb 11.2 oz (86.955 kg)  BMI 30.02 kg/m2  SpO2 99%  Physical Exam  Constitutional: He is oriented to person, place, and time.  Disheveled and somewhat cachectic  HENT:  Head: Normocephalic and atraumatic.  Abdominal: Soft. He exhibits no distension. There is no tenderness. There is no rebound and no guarding.  Pinhole eschar with leaking serous ascites from umbilical hernia skin  Neurological: He is alert and oriented to person, place, and time.  Skin: Skin is warm and dry. No rash noted. No erythema.  Vitals reviewed.     Results for orders placed or performed during the hospital encounter of 03/12/16 (from the past 48 hour(s))  CBC with Differential/Platelet     Status: Abnormal   Collection Time: 03/12/16  6:52 PM  Result Value Ref Range   WBC 7.1 3.8 - 10.6 K/uL   RBC 4.51 4.40 - 5.90 MIL/uL   Hemoglobin 12.1 (L) 13.0 - 18.0 g/dL   HCT 35.8 (L) 40.0 - 52.0 %   MCV 79.3 (L) 80.0 - 100.0 fL   MCH 26.8 26.0 - 34.0 pg   MCHC 33.8 32.0 - 36.0 g/dL   RDW 17.9 (H) 11.5 - 14.5 %   Platelets 212 150 - 440 K/uL   Neutrophils Relative % 77% %   Neutro Abs 5.5 1.4 - 6.5 K/uL   Lymphocytes Relative 8% %   Lymphs Abs 0.6 (L) 1.0 -  3.6 K/uL   Monocytes Relative 14% %   Monocytes Absolute 1.0 0.2 - 1.0 K/uL   Eosinophils Relative 1% %   Eosinophils Absolute 0.0 0 - 0.7 K/uL   Basophils Relative 0% %   Basophils Absolute 0.0 0 - 0.1 K/uL  Comprehensive metabolic panel     Status: Abnormal   Collection Time: 03/12/16  6:52 PM  Result Value Ref Range   Sodium 123 (L) 135 - 145 mmol/L   Potassium 3.6 3.5 - 5.1 mmol/L    Comment: HEMOLYSIS AT THIS LEVEL MAY AFFECT RESULT   Chloride 82 (L) 101 - 111 mmol/L   CO2 31 22 - 32 mmol/L   Glucose, Bld 107 (H) 65 - 99 mg/dL   BUN <5 (L) 6 - 20 mg/dL   Creatinine, Ser 0.82 0.61 - 1.24 mg/dL   Calcium 7.7 (L) 8.9 - 10.3 mg/dL   Total Protein 7.3 6.5 - 8.1 g/dL   Albumin 2.9 (L) 3.5 - 5.0  g/dL   AST 59 (H) 15 - 41 U/L    Comment: HEMOLYSIS AT THIS LEVEL MAY AFFECT RESULT   ALT 16 (L) 17 - 63 U/L    Comment: HEMOLYSIS AT THIS LEVEL MAY AFFECT RESULT   Alkaline Phosphatase 98 38 - 126 U/L   Total Bilirubin 1.7 (H) 0.3 - 1.2 mg/dL    Comment: HEMOLYSIS AT THIS LEVEL MAY AFFECT RESULT   GFR calc non Af Amer >60 >60 mL/min   GFR calc Af Amer >60 >60 mL/min    Comment: (NOTE) The eGFR has been calculated using the CKD EPI equation. This calculation has not been validated in all clinical situations. eGFR's persistently <60 mL/min signify possible Chronic Kidney Disease.    Anion gap 10 5 - 15  Lipase, blood     Status: None   Collection Time: 03/12/16  6:52 PM  Result Value Ref Range   Lipase 22 11 - 51 U/L  Protime-INR     Status: Abnormal   Collection Time: 03/12/16  6:52 PM  Result Value Ref Range   Prothrombin Time 18.0 (H) 11.4 - 15.0 seconds   INR 1.48   APTT     Status: Abnormal   Collection Time: 03/12/16  6:52 PM  Result Value Ref Range   aPTT 38 (H) 24 - 36 seconds    Comment:        IF BASELINE aPTT IS ELEVATED, SUGGEST PATIENT RISK ASSESSMENT BE USED TO DETERMINE APPROPRIATE ANTICOAGULANT THERAPY.   Lactic acid, plasma     Status: None   Collection Time: 03/12/16  6:52 PM  Result Value Ref Range   Lactic Acid, Venous 1.1 0.5 - 2.0 mmol/L  Body fluid cell count with differential     Status: Abnormal   Collection Time: 03/12/16  6:52 PM  Result Value Ref Range   Fluid Type-FCT Peritoneal    Color, Fluid YELLOW (A) YELLOW   Appearance, Fluid CLOUDY (A) CLEAR   WBC, Fluid 333 cu mm   Neutrophil Count, Fluid 48 %   Lymphs, Fluid 28 %   Monocyte-Macrophage-Serous Fluid 24 %   Eos, Fluid 0 %   Other Cells, Fluid 0 %  Lactate dehydrogenase (CSF, pleural or peritoneal fluid)     Status: Abnormal   Collection Time: 03/12/16  6:52 PM  Result Value Ref Range   LD, Fluid 45 (H) 3 - 23 U/L    Comment: (NOTE) Results should be evaluated in conjunction  with serum values  Fluid Type-FLDH OTHER   Protein, fluid - pleural or peritoneal     Status: None   Collection Time: 03/12/16  6:52 PM  Result Value Ref Range   Total protein, fluid <3.0 g/dL    Comment: SENT TO [facility] FOR ALTERNATIVE METHOD (NOTE) No normal range established for this test Results should be evaluated in conjunction with serum values    Fluid Type-FTP PERITONEAL CAVITY   Glucose, pleural or peritoneal fluid     Status: None   Collection Time: 03/12/16  6:52 PM  Result Value Ref Range   Glucose, Fluid 119 mg/dL    Comment: (NOTE) No normal range established for this test Results should be evaluated in conjunction with serum values    Fluid Type-FGLU PERITONEAL CAVITY   CBC     Status: Abnormal   Collection Time: 03/13/16  4:13 AM  Result Value Ref Range   WBC 4.2 3.8 - 10.6 K/uL   RBC 4.01 (L) 4.40 - 5.90 MIL/uL   Hemoglobin 10.5 (L) 13.0 - 18.0 g/dL   HCT 31.2 (L) 40.0 - 52.0 %   MCV 77.9 (L) 80.0 - 100.0 fL   MCH 26.3 26.0 - 34.0 pg   MCHC 33.8 32.0 - 36.0 g/dL   RDW 18.4 (H) 11.5 - 14.5 %   Platelets 168 150 - 440 K/uL  Basic metabolic panel     Status: Abnormal   Collection Time: 03/13/16  4:13 AM  Result Value Ref Range   Sodium 127 (L) 135 - 145 mmol/L   Potassium 2.7 (LL) 3.5 - 5.1 mmol/L    Comment: CRITICAL RESULT CALLED TO, READ BACK BY AND VERIFIED WITH JACKIE PAGE ON 03/13/16 AT 0453 BY TLB    Chloride 87 (L) 101 - 111 mmol/L   CO2 30 22 - 32 mmol/L   Glucose, Bld 99 65 - 99 mg/dL   BUN <5 (L) 6 - 20 mg/dL   Creatinine, Ser 0.80 0.61 - 1.24 mg/dL   Calcium 7.5 (L) 8.9 - 10.3 mg/dL   GFR calc non Af Amer >60 >60 mL/min   GFR calc Af Amer >60 >60 mL/min    Comment: (NOTE) The eGFR has been calculated using the CKD EPI equation. This calculation has not been validated in all clinical situations. eGFR's persistently <60 mL/min signify possible Chronic Kidney Disease.    Anion gap 10 5 - 15  Magnesium     Status: None   Collection  Time: 03/13/16  4:13 AM  Result Value Ref Range   Magnesium 1.9 1.7 - 2.4 mg/dL  Potassium     Status: None   Collection Time: 03/13/16  2:45 PM  Result Value Ref Range   Potassium 3.6 3.5 - 5.1 mmol/L   US Abdomen Limited  03/13/2016  CLINICAL DATA:  70 year old male with a history of recurrent ascites. The patient presents to the emergency department with spontaneous drainage. He has been referred for paracentesis. EXAM: US ABDOMEN LIMITED - RIGHT UPPER QUADRANT COMPARISON:  None. FINDINGS: Ultrasound performed of the abdomen to identify a potential access for paracentesis. No ascites visualized. On physical exam, the patient appears to have spontaneous rupture of an umbilical hernia, with apparent trans cutaneous communication to the umbilical hernia sac. IMPRESSION: No significant ascites within the abdomen. On physical exam, the patient appears to have a spontaneous rupture of an umbilical hernia. Signed, Dulcy Fanny. Earleen Newport, DO [facility] Electronically Signed   By: Corrie Mckusick D.O.   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 Successful closure of this ascites leak is very unlikely my recommendations are for observation at this point understanding that this could be a grave situation for the patient that we'll likely lead to infection but that surgical intervention will likely have the same result. As discussed with prime doc  Florene Glen, MD, FACS

## 2016-03-13 NOTE — Care Management (Signed)
[facility] with the [facility] abdominal asc[facility]. Son, Jeff Preston, lives with him. Sister is [place] [phone]). [facility]cock [facility] in the past. Last seen Dr. Mechele CollinElliott 2-3 weeks ago. No home health. No skilled facility. No home oxygen. Uses no equipment for ambulation. Takes care of all basic activities of daily living himself, doesn't drive. Sister does errands. No falls. Good appetite when he feels good, Sister will transport. Gwenette GreetBrenda S Chelcie Estorga RN MSN CCM Care Management [phone]

## 2016-03-13 NOTE — Progress Notes (Signed)
Interventional Radiology Progress Note  70 yo male with a history of liver disease and recurrent ascites.  He has had mx prior paracentesis.   Arrives at ED with spontaneous drainage via umbilicus.   Referred today for para  Procedure: US for possible paracentesis.Marland Kitchen.   Upon US, there is no significant ascites.  No para performed.  He has an umbilical hernia, and appears to have had spontaneous rupture of umbilical hernia sac.   Have paged primary, and will discuss further care.   Signed,  Yvone NeuJaime S. Loreta AveWagner, DO

## 2016-03-13 NOTE — Progress Notes (Signed)
Pt is alert and oriented, denies pain, resting in bed comfortably, abdominal US performed without paracentesis due to lack of accumulation of fluid. Surgeon consulted with patient but in unable to offer any surgical services at this time due to risk of infection, please see surgeons note. Receiving IV antibiotics, continuous serous drainage from umbilical. Sister visiting at bedside. Fair appetite.

## 2016-03-14 DIAGNOSIS — Z87891 Personal history of nicotine dependence: Secondary | ICD-10-CM

## 2016-03-14 DIAGNOSIS — E8809 Other disorders of plasma-protein metabolism, not elsewhere classified: Secondary | ICD-10-CM

## 2016-03-14 DIAGNOSIS — Z515 Encounter for palliative care: Secondary | ICD-10-CM

## 2016-03-14 DIAGNOSIS — K429 Umbilical hernia without obstruction or gangrene: Secondary | ICD-10-CM

## 2016-03-14 DIAGNOSIS — E876 Hypokalemia: Secondary | ICD-10-CM

## 2016-03-14 DIAGNOSIS — D649 Anemia, unspecified: Secondary | ICD-10-CM

## 2016-03-14 DIAGNOSIS — F1010 Alcohol abuse, uncomplicated: Secondary | ICD-10-CM

## 2016-03-14 DIAGNOSIS — K7030 Alcoholic cirrhosis of liver without ascites: Secondary | ICD-10-CM

## 2016-03-14 DIAGNOSIS — B9561 Methicillin susceptible Staphylococcus aureus infection as the cause of diseases classified elsewhere: Secondary | ICD-10-CM

## 2016-03-14 DIAGNOSIS — K652 Spontaneous bacterial peritonitis: Secondary | ICD-10-CM

## 2016-03-14 DIAGNOSIS — K219 Gastro-esophageal reflux disease without esophagitis: Secondary | ICD-10-CM

## 2016-03-14 DIAGNOSIS — E871 Hypo-osmolality and hyponatremia: Secondary | ICD-10-CM

## 2016-03-14 LAB — SODIUM: Sodium: 131 mmol/L — ABNORMAL LOW (ref 135–145)

## 2016-03-14 LAB — MISC LABCORP TEST (SEND OUT): Labcorp test code: 19588

## 2016-03-14 LAB — MAGNESIUM: MAGNESIUM: 2.1 mg/dL (ref 1.7–2.4)

## 2016-03-14 LAB — HEMOGLOBIN: HEMOGLOBIN: 11.3 g/dL — AB (ref 13.0–18.0)

## 2016-03-14 LAB — POTASSIUM: POTASSIUM: 4 mmol/L (ref 3.5–5.1)

## 2016-03-14 MED ORDER — POTASSIUM CHLORIDE CRYS ER 20 MEQ PO TBCR
40.0000 meq | EXTENDED_RELEASE_TABLET | Freq: Every day | ORAL | Status: AC
  Administered 2016-03-15 – 2016-03-16 (×2): 40 meq via ORAL
  Filled 2016-03-14 (×2): qty 2

## 2016-03-14 MED ORDER — FUROSEMIDE 40 MG PO TABS
40.0000 mg | ORAL_TABLET | Freq: Every day | ORAL | Status: DC
  Administered 2016-03-14 – 2016-03-17 (×4): 40 mg via ORAL
  Filled 2016-03-14 (×4): qty 1

## 2016-03-14 MED ORDER — ENOXAPARIN SODIUM 40 MG/0.4ML ~~LOC~~ SOLN
40.0000 mg | SUBCUTANEOUS | Status: DC
  Administered 2016-03-14 – 2016-03-17 (×4): 40 mg via SUBCUTANEOUS
  Filled 2016-03-14 (×4): qty 0.4

## 2016-03-14 MED ORDER — SPIRONOLACTONE 25 MG PO TABS
50.0000 mg | ORAL_TABLET | Freq: Two times a day (BID) | ORAL | Status: DC
  Administered 2016-03-14 – 2016-03-17 (×6): 50 mg via ORAL
  Filled 2016-03-14 (×6): qty 2

## 2016-03-14 MED ORDER — NYSTATIN 100000 UNIT/GM EX POWD
Freq: Two times a day (BID) | CUTANEOUS | Status: DC
  Administered 2016-03-14 – 2016-03-17 (×7): via TOPICAL
  Filled 2016-03-14: qty 15

## 2016-03-14 NOTE — Consult Note (Addendum)
Palliative Medicine Inpatient Consult Note   Name: Jeff BreslowDouglas W Preston Date: 03/14/2016 MRN: [id number]  DOB: 1946-04-30  Referring Physician: Katharina Caperima Vaickute, MD  Palliative Care consult requested for this 70 y.o. male for goals of medical therapy in patient with an umbilical hernia with an ascites leak.     DISCUSSIONS AND RECOMMENDATIONS: I have learned that this condition can be quite fatal per notes in the record.  I have not personally had much experience with patients with this unique condition and have not (as yet) done my own research on this.  Therefore, I am not comfortable being the FIRST person addressing the potential 'fatal' nature of this condition, especially since the pts nurse believes that the patient does not seem to have any idea about the odds, prognosis, etc.  Perhaps this would be something conveyed by surgery --even after a few more days has passed.    I did feel quite comfortable addressing code status. I went over it a bit differently than had been presented to him by others.  I was much more negative and mentioned that with CPR compressions, his internal organs would probably come through the hole in his abdomen, etc.  The statistics for him surviving a resuscitative event are truly quite poor.  I mentioned this all while expressing some hope for him while he is a living, breathing patient.  His sister was present for this talk.  He conferred with his sister and then said, "I would prefer to let God have his way and to go the natural way.  I will choose to be Do Not Resuscitate. The first time I decided to be 'full code', I did not have this condition I have now, so I will change it to be NO RESUSCITATION".    Pt's code status is changed to DNR.    I will be back on Monday AFTERNOON and will plan to follow pt then, as I suspect and hope that he will still be here.     ----------------------------------------------------------------  CLINICAL NARRATIVE: Pt is a [age] you man  with alcoholic cirrhosis who has had multiple paracenteses and a recent attempted paracentesis failed to identify a significant amount of fluid for drainage.  Then,the fluid started to increase, and also drain from a pinhole opening. He soaked several towels and blankets at home.  Pt came to the ED on 5/3 and was given a dose of IV albumin.  Hyponatremia of cirrhosis was noted and has improved.  His BP was wnl and BP meds were held. An US of abdomen showed spontaneous rupture of umbilical hernia (with gangrenous/ eroded tip) with minimal ascites. Surgical consultation was obtained from Dr. Excell Seltzerooper.  Umbilical hernia rupture is apparently almost always fatal. Pt was started on Vanco and Rocephin.  Surgery will likely have a resultant infection and observation and management of this conservatively can be quite grave also.  Surgery is not to take place.   Wound care gave option for pt to have an Eakin pouch to catch the umbilical drainage (this won't heal him but can manage the fluid).  A culture of the draining fluid (performed w/o paracentesis) is growing staph aureus.   ----------------------------------------------------------------------  ACTIVE PROBLEMS:   Bacterial Peritonitis  ---Acute and with Staph aureus infection ---Due to ruptured umbilical hernia Alcoholic cirrhosis (total bilirubin is 1.7, Cr is 0.80, INR is 1.48, sodium 123-131 now) ----Meld score= 18 (which equals a 6% mortality risk within three months) ----child's Pugh =C however.  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 No fever or chills. Fatiqued. Not overly depressed at this time Balance of ROS otw negative except for the facts mentioned as above and  the large volume drainage from ruptured hernia (now being collected in an Pendleton' colostomy pouch).    SOCIAL HISTORY:  reports that he has quit smoking. He does not have any smokeless tobacco history on file. He reports that he does not drink alcohol or use illicit drugs. Son is Raysean Graumann who lives w/ pt. Sister is [place] [phone]).    LEGAL DOCUMENTS:  none  CODE STATUS: Full code  PAST MEDICAL HISTORY: Past Medical History  Diagnosis Date  . Hypertension   . Depression   . GERD (gastroesophageal reflux disease)   . Cirrhosis (HCC)   . Anxiety     PAST SURGICAL HISTORY:  Past Surgical History  Procedure Laterality Date  . Throat surgery    . Cholecystectomy      ALLERGIES:  is allergic to dilantin and penicillins.  MEDICATIONS:  Current Facility-Administered Medications  Medication Dose Route Frequency Provider Last Rate Last Dose  . 0.9 %  sodium chloride infusion  250 mL Intravenous PRN Auburn Bilberry, MD      . acetaminophen (TYLENOL) tablet 650 mg  650 mg Oral Q6H PRN Auburn Bilberry, MD       Or  . acetaminophen (TYLENOL) suppository 650 mg  650 mg Rectal Q6H PRN Auburn Bilberry, MD      . cefTRIAXone (ROCEPHIN) 1 g in dextrose 5 % 50 mL IVPB  1 g Intravenous Q24H Auburn Bilberry, MD   1 g at 03/13/16 1654  . enoxaparin (LOVENOX) injection 40 mg  40 mg Subcutaneous Q24H Katharina Caper, MD      . LORazepam (ATIVAN) tablet 0.5 mg  0.5 mg Oral TID Auburn Bilberry, MD   0.5 mg at 03/14/16 0903  . nadolol (CORGARD) tablet 20 mg  20 mg Oral Daily Auburn Bilberry, MD   20 mg at 03/12/16 2145  . nystatin (MYCOSTATIN) powder   Topical BID Katharina Caper, MD      . ondansetron (ZOFRAN) tablet 4 mg  4 mg Oral Q6H PRN Auburn Bilberry, MD       Or  . ondansetron (ZOFRAN) injection 4 mg  4 mg Intravenous Q6H PRN Auburn Bilberry, MD      . ondansetron (ZOFRAN-ODT) disintegrating tablet 4 mg  4 mg Oral Q8H PRN Auburn Bilberry, MD      . oxyCODONE (Oxy IR/ROXICODONE)  immediate release tablet 5 mg  5 mg Oral Q4H PRN Auburn Bilberry, MD      . pantoprazole (PROTONIX) EC tablet 40 mg  40 mg Oral Daily Auburn Bilberry, MD   40 mg at 03/14/16 1000  . [START ON 03/15/2016] potassium chloride SA (K-DUR,KLOR-CON) CR tablet 40 mEq  40 mEq Oral Daily Katharina Caper, MD      . simethicone (MYLICON) chewable tablet 160-240 mg  160-240 mg Oral Q6H PRN Auburn Bilberry, MD      . sodium chloride flush (NS) 0.9 % injection 3 mL  3 mL Intravenous Q12H Auburn Bilberry, MD   3 mL at 03/13/16 2044  . sodium chloride flush (NS) 0.9 % injection 3 mL  3 mL Intravenous PRN  Auburn Bilberry, MD      . spironolactone (ALDACTONE) tablet 50 mg  50 mg Oral Daily Auburn Bilberry, MD   50 mg at 03/14/16 0903  . zolpidem (AMBIEN) tablet 5 mg  5 mg Oral QHS PRN Katharina Caper, MD   5 mg at 03/13/16 2245    Vital Signs: BP 120/55 mmHg  Pulse 81  Temp(Src) 98.1 F (36.7 C) (Oral)  Resp 20  Ht 5\' 7"  (1.702 m)  Wt 86.955 kg (191 lb 11.2 oz)  BMI 30.02 kg/m2  SpO2 95% Filed Weights   03/12/16 1824 03/12/16 2139  Weight: 99.791 kg (220 lb) 86.955 kg (191 lb 11.2 oz)    Estimated body mass index is 30.02 kg/(m^2) as calculated from the following:   Height as of this encounter: 5\' 7"  (1.702 m).   Weight as of this encounter: 86.955 kg (191 lb 11.2 oz).  PERFORMANCE STATUS (ECOG) : 4 - Bedbound  PHYSICAL EXAM: Lying in medical bed NAD Alert and Oriented No JVD or Tm Hrt rrr no mgr Lungs cta no rales Abd had an Eakans pouch which is almost full of thin milky fluid It looks like he has a narrrow stoma through the bag --but this is a ruptured umbilical hernia and not a stoma at all! Skin folds have erythema in groin (Nystatin powder is being used) No cyanosis or mottling   LABS: CBC:    Component Value Date/Time   WBC 4.2 03/13/2016 0413   WBC 8.7 03/05/2015 0110   HGB 10.5* 03/13/2016 0413   HGB 10.8* 03/05/2015 0110   HCT 31.2* 03/13/2016 0413   HCT 31.9* 03/05/2015 0110   PLT  168 03/13/2016 0413   PLT 151 03/05/2015 0110   MCV 77.9* 03/13/2016 0413   MCV 84 03/05/2015 0110   NEUTROABS 5.5 03/12/2016 1852   NEUTROABS 6.5 03/05/2015 0110   LYMPHSABS 0.6* 03/12/2016 1852   LYMPHSABS 1.0 03/05/2015 0110   MONOABS 1.0 03/12/2016 1852   MONOABS 0.9 03/05/2015 0110   EOSABS 0.0 03/12/2016 1852   EOSABS 0.2 03/05/2015 0110   BASOSABS 0.0 03/12/2016 1852   BASOSABS 0 03/05/2015 1215   BASOSABS 0.0 03/05/2015 0110   Comprehensive Metabolic Panel:    Component Value Date/Time   NA 131* 03/14/2016 0455   NA 134* 03/05/2015 0110   K 4.0 03/14/2016 0455   K 3.8 03/05/2015 0110   CL 87* 03/13/2016 0413   CL 98* 03/05/2015 0110   CO2 30 03/13/2016 0413   CO2 26 03/05/2015 0110   BUN <5* 03/13/2016 0413   BUN 7 03/05/2015 0110   CREATININE 0.80 03/13/2016 0413   CREATININE 1.04 03/05/2015 0110   GLUCOSE 99 03/13/2016 0413   GLUCOSE 96 03/05/2015 0110   CALCIUM 7.5* 03/13/2016 0413   CALCIUM 8.0* 03/05/2015 0110   AST 59* 03/12/2016 1852   AST 61* 03/05/2015 0110   ALT 16* 03/12/2016 1852   ALT 22 03/05/2015 0110   ALKPHOS 98 03/12/2016 1852   ALKPHOS 108 03/05/2015 0110   BILITOT 1.7* 03/12/2016 1852   BILITOT 1.9* 03/05/2015 0110   PROT 7.3 03/12/2016 1852   PROT 7.4 03/05/2015 0110   ALBUMIN 2.9* 03/12/2016 1852   ALBUMIN 3.0* 03/05/2015 0110   More than 50% of the visit was spent in counseling/coordination of care: Yes  Time Spent: 85 minutes

## 2016-03-14 NOTE — Progress Notes (Signed)
MEDICATION RELATED CONSULT NOTE   Pharmacy Consult for electrolyte monitoring  Allergies  Allergen Reactions  . Dilantin [Phenytoin Sodium Extended] Rash  . Penicillins Rash and Other (See Comments)    Has patient had a PCN reaction causing immediate rash, facial/tongue/throat swelling, SOB or lightheadedness with hypotension: No Has patient had a PCN reaction causing severe rash involving mucus membranes or skin necrosis: No Has patient had a PCN reaction that required hospitalization No Has patient had a PCN reaction occurring within the last 10 years: No If all of the above answers are "NO", then may proceed with Cephalosporin use.    Patient Measurements: Height: 5\' 7"  (170.2 cm) Weight: 191 lb 11.2 oz (86.955 kg) IBW/kg (Calculated) : 66.1  Intake/Output from previous day: 05/04 0701 - 05/05 0700 In: 290 [P.O.:240; IV Piggyback:50] Out: 1350 [Urine:1350] Intake/Output from this shift: Total I/O In: 240 [P.O.:240] Out: -   Labs:  Recent Labs  03/12/16 1852 03/13/16 0413 03/13/16 1445 03/14/16 0455  WBC 7.1 4.2  --   --   HGB 12.1* 10.5*  --   --   HCT 35.8* 31.2*  --   --   PLT 212 168  --   --   APTT 38*  --   --   --   CREATININE 0.82 0.80  --   --   MG  --  1.9  --  2.1  PHOS  --   --  2.4*  --   ALBUMIN 2.9*  --   --   --   PROT 7.3  --   --   --   AST 59*  --   --   --   ALT 16*  --   --   --   ALKPHOS 98  --   --   --   BILITOT 1.7*  --   --   --    Estimated Creatinine Clearance: 93.1 mL/min (by C-G formula based on Cr of 0.8).  Assessment: Pharmacy consulted for electrolyte admitted with ascites/umbilical hernia rupture. Patient received potassium 160 mEq PO on 5/4. Patient currently ordered potassium 40mEq PO Daily.   Plan:  Will continue potassium 40mEq PO daily. Will obtain follow-up electrolytes with am labs.   Pharmacy will continue to monitor and adjust per consult.    Simpson,Michael L 03/14/2016,9:43 AM

## 2016-03-14 NOTE — Progress Notes (Addendum)
Pt is alert and oriented x 4, mobile in bed, denies pain, anxiety controlled with scheduled ativan, pouch placed over umbilical hernia by ostomy RN, site continues to have copious drainage, pouch emptied twice during shift (appro 600 cc). Sister visiting at bedside, vital signs stable, on room air, receiving IV antibiotics, palliative care consulted with pt and he is now a DNR. Uneventful shift.

## 2016-03-14 NOTE — Progress Notes (Signed)
[facility] [place] at [facility]   PATIENT NAME: Jeff Preston    MR#:  [id number]  DATE OF BIRTH:  1946-02-07  SUBJECTIVE:  CHIEF COMPLAINT:   Chief Complaint  Patient presents with  . umbilical drainage   Patient is 70 year old Caucasian male with a past medical history significant for history of alcoholic liver cirrhosis, ascites, essential hypertension, depression, gastroesophageal reflux disease who presented to the hospital with drainage from his umbilicus, which started approximately a few days ago. His last paracentesis was about 3 weeks ago. He was supposed to get another paracentesis next week. However, due to increased drainage. He decided to come to emergency room for further evaluation and therapy. Patient denies any fevers, abdominal pain at present. Patient underwent ultrasound of abdomen which revealed a spontaneous rupture of umbilical hernia and minimal amount of ascites, surgical consultation was obtained. Surgery saw patient in consultation, however, patient refuses any operative therapy, transfer to tertiary care center.cultures from 03/12/2016 revealed Staphylococcus aureus, patient now is on the Rocephin and vancomycin intravenously, no fevers or abdominal pain. Received care discussed with patient CODE STATUS and decided on DO NOT RESUSCITATE code.   Review of Systems  Constitutional: Negative for fever, chills and weight loss.  HENT: Negative for congestion.   Eyes: Negative for blurred vision and double vision.  Respiratory: Negative for cough, sputum production, shortness of breath and wheezing.   Cardiovascular: Negative for chest pain, palpitations, orthopnea, leg swelling and PND.  Gastrointestinal: Negative for nausea, vomiting, abdominal pain, diarrhea, constipation and blood in stool.  Genitourinary: Negative for dysuria, urgency, frequency and hematuria.  Musculoskeletal: Negative for falls.  Neurological: Negative for dizziness,  tremors, focal weakness and headaches.  Endo/Heme/Allergies: Does not bruise/bleed easily.  Psychiatric/Behavioral: Negative for depression. The patient does not have insomnia.     VITAL SIGNS: Blood pressure 121/57, pulse 75, temperature 97.9 F (36.6 C), temperature source Oral, resp. rate 20, height 5\' 7"  (1.702 m), weight 86.955 kg (191 lb 11.2 oz), SpO2 98 %.  PHYSICAL EXAMINATION:   GENERAL:  70 y.o.-year-old patient lying in the bed with no acute distress.  EYES: Pupils equal, round, reactive to light and accommodation. No scleral icterus. Extraocular muscles intact.  HEENT: Head atraumatic, normocephalic. Oropharynx and nasopharynx clear.  NECK:  Supple, no jugular venous distention. No thyroid enlargement, no tenderness.  LUNGS: Normal breath sounds bilaterally, no wheezing, rales,rhonchi or crepitation. No use of accessory muscles of respiration.  CARDIOVASCULAR: S1, S2 normal. No murmurs, rubs, or gallops.  ABDOMEN: Soft, nontender, nondistended. Bowel sounds present. No organomegaly or mass. Small umbilical hernia was noted with orifice, which is draining milky peritoneal fluid , no obvious purulence  EXTREMITIES: No pedal edema, cyanosis, or clubbing.  NEUROLOGIC: Cranial nerves II through XII are intact. Muscle strength 5/5 in all extremities. Sensation intact. Gait not checked.  PSYCHIATRIC: The patient is alert and oriented x 3.  SKIN: No obvious rash, lesion, or ulcer.   ORDERS/RESULTS REVIEWED:   CBC  Recent Labs Lab 03/12/16 1852 03/13/16 0413  WBC 7.1 4.2  HGB 12.1* 10.5*  HCT 35.8* 31.2*  PLT 212 168  MCV 79.3* 77.9*  MCH 26.8 26.3  MCHC 33.8 33.8  RDW 17.9* 18.4*  LYMPHSABS 0.6*  --   MONOABS 1.0  --   EOSABS 0.0  --   BASOSABS 0.0  --    ------------------------------------------------------------------------------------------------------------------  Chemistries   Recent Labs Lab 03/12/16 1852 03/13/16 0413 03/13/16 1445 03/14/16 0455  NA  123* 127*  --  131*  K 3.6 2.7* 3.6 4.0  CL 82* 87*  --   --   CO2 31 30  --   --   GLUCOSE 107* 99  --   --   BUN <5* <5*  --   --   CREATININE 0.82 0.80  --   --   CALCIUM 7.7* 7.5*  --   --   MG  --  1.9  --  2.1  AST 59*  --   --   --   ALT 16*  --   --   --   ALKPHOS 98  --   --   --   BILITOT 1.7*  --   --   --    ------------------------------------------------------------------------------------------------------------------ estimated creatinine clearance is 93.1 mL/min (by C-G formula based on Cr of 0.8). ------------------------------------------------------------------------------------------------------------------ No results for input(s): TSH, T4TOTAL, T3FREE, THYROIDAB in the last 72 hours.  Invalid input(s): FREET3  Cardiac Enzymes No results for input(s): CKMB, TROPONINI, MYOGLOBIN in the last 168 hours.  Invalid input(s): CK ------------------------------------------------------------------------------------------------------------------ Invalid input(s): POCBNP ---------------------------------------------------------------------------------------------------------------  RADIOLOGY: US Abdomen Limited  03/13/2016  CLINICAL DATA:  70 year old male with a history of recurrent ascites. The patient presents to the emergency department with spontaneous drainage. He has been referred for paracentesis. EXAM: US ABDOMEN LIMITED - RIGHT UPPER QUADRANT COMPARISON:  None. FINDINGS: Ultrasound performed of the abdomen to identify a potential access for paracentesis. No ascites visualized. On physical exam, the patient appears to have spontaneous rupture of an umbilical hernia, with apparent trans cutaneous communication to the umbilical hernia sac. IMPRESSION: No significant ascites within the abdomen. On physical exam, the patient appears to have a spontaneous rupture of an umbilical hernia. Signed, Yvone Neu. Loreta Ave, DO [facility]  Radiology Electronically Signed   By: Gilmer Mor D.O.   On: 03/13/2016 10:39    EKG:  Orders placed or performed in visit on 05/21/14  . EKG 12-Lead    ASSESSMENT AND PLAN:  Active Problems:   Ascites   Open wound of umbilical region #1. Umbilical hernia rupture due to gangrenous/erosed  Tip,draining peritoneal fluid was no obvious peritonitis, although wound cultures revealed Staphylococcus aureus, which could still be contamination from the skin, appreciate surgery input  Patient is being continued on intravenous antibiotics,awaiting for culture results, possible change to oral antibiotics when cultures are finalized, palliative care consultation is appreciated.   #2. Ascites due to alcoholic liver disease, ongoing alcohol abuse, not much ascites is seen on ultrasound of abdomen due to spontaneous leakage, surgery commenced advance diuretics to dry patient's ascites as much as possible and consider surgical therapy, although risks of surgery and peritoneal leakage is significant #3. Hyponatremia, improved  #4. Coagulopathy, due to alcohol related liver disease, no obvious bleeding, although hemoglobin dropped down to about one half a gram, recheck today, no bleeding was noted #5. Hypokalemia, resolved, magnesium level is normal   Management plans discussed with the patient, family and they are in agreement.   DRUG ALLERGIES:  Allergies  Allergen Reactions  . Dilantin [Phenytoin Sodium Extended] Rash  . Penicillins Rash and Other (See Comments)    Has patient had a PCN reaction causing immediate rash, facial/tongue/throat swelling, SOB or lightheadedness with hypotension: No Has patient had a PCN reaction causing severe rash involving mucus membranes or skin necrosis: No Has patient had a PCN reaction that required hospitalization No Has patient had a PCN reaction occurring within the last 10 years: No If all of  the above answers are "NO", then may proceed with Cephalosporin use.     CODE STATUS:     Code Status Orders        Start     Ordered   03/12/16 2009  Full code   Continuous     03/12/16 2008    Code Status History    Date Active Date Inactive Code Status Order ID Comments User Context   This patient has a current code status but no historical code status.      TOTAL TIME TAKING CARE OF THIS PATIENT: 35 minutes.    Katharina CaperVAICKUTE,Lakendra Helling M.D on 03/14/2016 at 2:30 PM  Between 7am to 6pm - Pager - [phone]  After 6pm go to [email or web address] - password [email or web address]  [facility]  Office  [phone]  CC: Primary care physician; No PCP Per Patient

## 2016-03-14 NOTE — Consult Note (Signed)
WOC ostomy consult note Stoma type/location: Umbilical hernia with ascites leak.  Not a surgical candidate at this time.  Stomal assessment/size: Protruding umbilicus with constant leaking of fluid from ascites noted.  Abdomen is round and firm.  Peristomal assessment: Skin is intact at this time.  Skin fold erythema noted in groin, MD has addressed with Nystatin powder.  Bedside RN states this has improved.   Treatment options for stomal/peristomal skin: Barrier ring around umbilicus and will apply a Medium Eakin wound pouch with a spout.  Patient can wear this and empty himself.   Output Clear fluid from ascites leaking from umbilical hernia.  Ostomy pouching: 1pc Eakin pouch and barrier ring Education provided: Informed that this was a wound pouching system and this was not intended to heal his wound, but to manage the symptoms of the leaking and prevent skin breakdown.   Enrolled patient in [facility] [employer] DC program: N/A WOC team will follow.  Maple HudsonKaren Jlee Harkless RN BSN CWON Pager [phone]

## 2016-03-14 NOTE — Care Management Important Message (Signed)
Important Message  Patient Details  Name: Jeff BreslowDouglas W Sisney MRN: [id number] Date of Birth: March 10, 1946   Medicare Important Message Given:  Yes    Gwenette GreetBrenda S Omari Mcmanaway, RN 03/14/2016, 1:18 PM

## 2016-03-14 NOTE — Progress Notes (Signed)
70 year old male with alcoholic cirrhosis admitted with a leaking umbilical hernia. Seen by Dr. Excell Seltzerooper. I have personally seen and examined him and review all his medical records he is actually child's B to some failed to demonstrate any tense ascites. INR less than 1.48, no evidence of encephalopathy, normal creatinine,  albumin is 1.9, Na is 127 He usually had I personally disease is don't every month with a large volume about 8 L.. Cultures from a side occluding are growing staph whether this is contaminant or not would be very difficult to tell. Clinically patient is doing well without evidence of peritonitis or septicemia.  On exam he is in no acute distress he's awake alert no evidence of encephalopathy Chest: CTA, NSR Abd: soft, there is a 3 mm hole in the umbilical area draining some cloudy ascites and he now has a colostomy back catching all the fluid. There is no peritonitis and the hernia is reducible Ext: no edema, well perfused   A/P Roddick patient child's B with umbilical hernia and drain ascites. This is a very difficult situation in ideal conditions we will optimize him medically and then perform the operation. I agree with continuation of antibiotics and I will increase the Aldactone and at Lasix and make sure we control his ascites is good as we can get. I do not think that TIPS is indicated at this time. And there is no GI coverage today but I discussed the case in detail with Dr. wall and he agrees with me that TIPS is not indicated at this time. Cousin with the patient in detail about options of sending him to a tertiary center. He states that he rather stays here. We also discussed about the surgical option at this time he is very reluctant to take any operative risk and actually is thinking about going home with by mouth antibiotics and a bag. I discussed with him that this would not be wise his options at there is a good chance that he will get peritonitis. currently he is not  willing to undergo any surgical intervention at this time. Now we will optimize him medically and keep him on antibiotics and evaluate him daily. If he changes his mind we may be able to perform a repair with a by prostatic and +/-, drain placement.. Extensive counseling provided.

## 2016-03-15 LAB — COMPREHENSIVE METABOLIC PANEL
ALBUMIN: 2.7 g/dL — AB (ref 3.5–5.0)
ALK PHOS: 96 U/L (ref 38–126)
ALT: 13 U/L — ABNORMAL LOW (ref 17–63)
ANION GAP: 10 (ref 5–15)
AST: 37 U/L (ref 15–41)
BILIRUBIN TOTAL: 0.8 mg/dL (ref 0.3–1.2)
BUN: 10 mg/dL (ref 6–20)
CALCIUM: 7.9 mg/dL — AB (ref 8.9–10.3)
CO2: 27 mmol/L (ref 22–32)
Chloride: 93 mmol/L — ABNORMAL LOW (ref 101–111)
Creatinine, Ser: 1.18 mg/dL (ref 0.61–1.24)
GLUCOSE: 120 mg/dL — AB (ref 65–99)
Potassium: 4.1 mmol/L (ref 3.5–5.1)
Sodium: 130 mmol/L — ABNORMAL LOW (ref 135–145)
TOTAL PROTEIN: 6.4 g/dL — AB (ref 6.5–8.1)

## 2016-03-15 LAB — CBC
HCT: 34.5 % — ABNORMAL LOW (ref 40.0–52.0)
Hemoglobin: 11.6 g/dL — ABNORMAL LOW (ref 13.0–18.0)
MCH: 27 pg (ref 26.0–34.0)
MCHC: 33.6 g/dL (ref 32.0–36.0)
MCV: 80.3 fL (ref 80.0–100.0)
Platelets: 218 10*3/uL (ref 150–440)
RBC: 4.3 MIL/uL — ABNORMAL LOW (ref 4.40–5.90)
RDW: 18.3 % — AB (ref 11.5–14.5)
WBC: 5.8 10*3/uL (ref 3.8–10.6)

## 2016-03-15 LAB — CULTURE, BODY FLUID-BOTTLE

## 2016-03-15 LAB — CULTURE, BODY FLUID W GRAM STAIN -BOTTLE

## 2016-03-15 LAB — PROTIME-INR
INR: 1.32
Prothrombin Time: 16.5 seconds — ABNORMAL HIGH (ref 11.4–15.0)

## 2016-03-15 LAB — APTT: APTT: 38 s — AB (ref 24–36)

## 2016-03-15 MED ORDER — LORAZEPAM 0.5 MG PO TABS
0.5000 mg | ORAL_TABLET | Freq: Once | ORAL | Status: AC
  Administered 2016-03-15: 01:00:00 0.5 mg via ORAL
  Filled 2016-03-15: qty 1

## 2016-03-15 MED ORDER — ZOLPIDEM TARTRATE 5 MG PO TABS
5.0000 mg | ORAL_TABLET | Freq: Every evening | ORAL | Status: DC | PRN
  Administered 2016-03-16: 5 mg via ORAL
  Filled 2016-03-15: qty 1

## 2016-03-15 MED ORDER — BISACODYL 5 MG PO TBEC
5.0000 mg | DELAYED_RELEASE_TABLET | Freq: Every day | ORAL | Status: DC | PRN
Start: 2016-03-15 — End: 2016-03-17
  Administered 2016-03-15: 12:00:00 5 mg via ORAL
  Filled 2016-03-15: qty 1

## 2016-03-15 MED ORDER — EYE WASH OPHTH SOLN: 2.0000 [drp] | OPHTHALMIC | Status: DC

## 2016-03-15 MED ORDER — POLYVINYL ALCOHOL 1.4 % OP SOLN
2.0000 [drp] | OPHTHALMIC | Status: DC
  Administered 2016-03-15 – 2016-03-17 (×13): 2 [drp] via OPHTHALMIC
  Filled 2016-03-15: qty 15

## 2016-03-15 MED ORDER — SULFAMETHOXAZOLE-TRIMETHOPRIM 800-160 MG PO TABS
1.0000 | ORAL_TABLET | Freq: Two times a day (BID) | ORAL | Status: DC
  Administered 2016-03-15 – 2016-03-17 (×5): 1 via ORAL
  Filled 2016-03-15 (×5): qty 1

## 2016-03-15 NOTE — Progress Notes (Signed)
MEDICATION RELATED CONSULT NOTE   Pharmacy Consult for electrolyte monitoring  Allergies  Allergen Reactions  . Dilantin [Phenytoin Sodium Extended] Rash  . Penicillins Rash and Other (See Comments)    Has patient had a PCN reaction causing immediate rash, facial/tongue/throat swelling, SOB or lightheadedness with hypotension: No Has patient had a PCN reaction causing severe rash involving mucus membranes or skin necrosis: No Has patient had a PCN reaction that required hospitalization No Has patient had a PCN reaction occurring within the last 10 years: No If all of the above answers are "NO", then may proceed with Cephalosporin use.    Patient Measurements: Height: 5\' 7"  (170.2 cm) Weight: 191 lb 11.2 oz (86.955 kg) IBW/kg (Calculated) : 66.1  Intake/Output from previous day: 05/05 0701 - 05/06 0700 In: 770 [P.O.:720; IV Piggyback:50] Out: 1525 [Urine:1525] Intake/Output from this shift:    Labs:  Recent Labs  03/12/16 1852 03/13/16 0413 03/13/16 1445 03/14/16 0455 03/15/16 0520  WBC 7.1 4.2  --   --  5.8  HGB 12.1* 10.5*  --  11.3* 11.6*  HCT 35.8* 31.2*  --   --  34.5*  PLT 212 168  --   --  218  APTT 38*  --   --   --  38*  CREATININE 0.82 0.80  --   --  1.18  MG  --  1.9  --  2.1  --   PHOS  --   --  2.4*  --   --   ALBUMIN 2.9*  --   --   --  2.7*  PROT 7.3  --   --   --  6.4*  AST 59*  --   --   --  37  ALT 16*  --   --   --  13*  ALKPHOS 98  --   --   --  96  BILITOT 1.7*  --   --   --  0.8    Lab Results  Component Value Date   K 4.1 03/15/2016   Estimated Creatinine Clearance: 63.1 mL/min (by C-G formula based on Cr of 1.18).  Assessment: Pharmacy consulted for electrolyte admitted with ascites/umbilical hernia rupture. Patient received potassium 160 mEq PO on 5/4. Patient currently ordered potassium 40mEq PO Daily.   Plan:  Will continue potassium 40mEq PO daily. Will obtain follow-up electrolytes with am labs.   5/6  K=4.1.  Patient  currently ordered KCL 40meq daily x 2 days. (also on Spironolactone).  Pharmacy will continue to monitor and adjust per consult.    Carla Whilden A 03/15/2016,9:05 AM

## 2016-03-15 NOTE — Progress Notes (Signed)
70 year old male with alcoholic cirrhosis admitted with a leaking umbilical hernia. Seen by Dr. Excell Seltzerooper. He gets routine paracentesis every month with a large volume about 8 L.. Cultures f are growing staph whether this is contaminant or not would be very difficult to tell. Clinically patient is doing well without evidence of peritonitis or septicemia.  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 There is no peritonitis and the hernia is reducible Ext: no edema, well perfused   A/P Roddick patient child's B with umbilical hernia and draining ascites.  Difficult situation with his cirrhosis and overall medical condition, but this is unlikely to spontaneously resolve.  Measures to reduce his ascites may help, which are being done.  Surgical repair is a potential solution though obviously risky both in terms of morbidity and mortality.  Patient is not interested in surgical management at this time.  Saw palliative care, is now DNR.  Currently the wound manager is controlling the drainage though I think it is unlikely that he will be able to safely manage this as an outpatient and is at risk of ascending infection or other decompensation.

## 2016-03-15 NOTE — Progress Notes (Signed)
Notified Dr. Markham JordanElliot that patient is in room and has not checked out or been out of the room for any reason today concerning note placed this morning by Dr. Markham JordanElliot.

## 2016-03-15 NOTE — Progress Notes (Addendum)
[facility] [place] [facility]   PATIENT NAME: Jeff Preston    MR#:  [id number]  DATE OF BIRTH:  1946-02-15  SUBJECTIVE:  CHIEF COMPLAINT:   Chief Complaint  Patient presents with  . umbilical drainage   Patient is 70 year old Caucasian male with a past medical history significant for history of alcoholic liver cirrhosis, ascites, essential hypertension, depression, gastroesophageal reflux disease who presented to the hospital with drainage from his umbilicus, which started approximately a few days ago. His last paracentesis was about 3 weeks ago. He was supposed to get another paracentesis next week. However, due to increased drainage. He decided to come to emergency room for further evaluation and therapy. Patient denies any fevers, abdominal pain at present. Patient underwent ultrasound of abdomen which revealed a spontaneous rupture of umbilical hernia and minimal amount of ascites, surgical consultation was obtained. Surgery saw patient in consultation, however, patient refuses any operative therapy, transfer to tertiary care center.cultures from 03/12/2016 revealed Staphylococcus aureus, patient now is on the Rocephin and vancomycin intravenously, no fevers or abdominal pain. Received care discussed with patient CODE STATUS and decided on DO NOT RESUSCITATE code.  Today during my examination patient is reporting that he is feeling weak and tired and trying to get strong. He is aware that surgery is not considering any surgical interventions   Review of Systems  Constitutional: Negative for fever, chills and weight loss.  HENT: Negative for congestion.   Eyes: Negative for blurred vision and double vision.  Respiratory: Negative for cough, sputum production, shortness of breath and wheezing.   Cardiovascular: Negative for chest pain, palpitations, orthopnea, leg swelling and PND.  Gastrointestinal: Negative for nausea, vomiting, abdominal pain, diarrhea,  constipation and blood in stool.  Genitourinary: Negative for dysuria, urgency, frequency and hematuria.  Musculoskeletal: Negative for falls.  Neurological: Negative for dizziness, tremors, focal weakness and headaches.  Endo/Heme/Allergies: Does not bruise/bleed easily.  Psychiatric/Behavioral: Negative for depression. The patient does not have insomnia.     VITAL SIGNS: Blood pressure 127/64, pulse 86, temperature 98.5 F (36.9 C), temperature source Oral, resp. rate 20, height 5\' 7"  (1.702 m), weight 86.955 kg (191 lb 11.2 oz), SpO2 98 %.  PHYSICAL EXAMINATION:   GENERAL:  70 y.o.-year-old patient lying in the bed with no acute distress.  EYES: Pupils equal, round, reactive to light and accommodation. No scleral icterus. Extraocular muscles intact.  HEENT: Head atraumatic, normocephalic. Oropharynx and nasopharynx clear.  NECK:  Supple, no jugular venous distention. No thyroid enlargement, no tenderness.  LUNGS: Normal breath sounds bilaterally, no wheezing, rales,rhonchi or crepitation. No use of accessory muscles of respiration.  CARDIOVASCULAR: S1, S2 normal. No murmurs, rubs, or gallops.  ABDOMEN: Soft, nontender, nondistended. Bowel sounds present. No organomegaly or mass. Small umbilical hernia was noted with orifice, which is draining milky peritoneal fluid , no obvious purulence  EXTREMITIES: No pedal edema, cyanosis, or clubbing.  NEUROLOGIC: Cranial nerves II through XII are intact. Muscle strength 5/5 in all extremities. Sensation intact. Gait not checked.  PSYCHIATRIC: The patient is alert and oriented x 3.  SKIN: No obvious rash, lesion, or ulcer.   ORDERS/RESULTS REVIEWED:   CBC  Recent Labs Lab 03/12/16 1852 03/13/16 0413 03/14/16 0455 03/15/16 0520  WBC 7.1 4.2  --  5.8  HGB 12.1* 10.5* 11.3* 11.6*  HCT 35.8* 31.2*  --  34.5*  PLT 212 168  --  218  MCV 79.3* 77.9*  --  80.3  MCH 26.8 26.3  --  27.0  MCHC 33.8 33.8  --  33.6  RDW 17.9* 18.4*  --  18.3*   LYMPHSABS 0.6*  --   --   --   MONOABS 1.0  --   --   --   EOSABS 0.0  --   --   --   BASOSABS 0.0  --   --   --    ------------------------------------------------------------------------------------------------------------------  Chemistries   Recent Labs Lab 03/12/16 1852 03/13/16 0413 03/13/16 1445 03/14/16 0455 03/15/16 0520  NA 123* 127*  --  131* 130*  K 3.6 2.7* 3.6 4.0 4.1  CL 82* 87*  --   --  93*  CO2 31 30  --   --  27  GLUCOSE 107* 99  --   --  120*  BUN <5* <5*  --   --  10  CREATININE 0.82 0.80  --   --  1.18  CALCIUM 7.7* 7.5*  --   --  7.9*  MG  --  1.9  --  2.1  --   AST 59*  --   --   --  37  ALT 16*  --   --   --  13*  ALKPHOS 98  --   --   --  96  BILITOT 1.7*  --   --   --  0.8   ------------------------------------------------------------------------------------------------------------------ estimated creatinine clearance is 63.1 mL/min (by C-G formula based on Cr of 1.18). ------------------------------------------------------------------------------------------------------------------ No results for input(s): TSH, T4TOTAL, T3FREE, THYROIDAB in the last 72 hours.  Invalid input(s): FREET3  Cardiac Enzymes No results for input(s): CKMB, TROPONINI, MYOGLOBIN in the last 168 hours.  Invalid input(s): CK ------------------------------------------------------------------------------------------------------------------ Invalid input(s): POCBNP ---------------------------------------------------------------------------------------------------------------  RADIOLOGY: No results found.  EKG:  Orders placed or performed in visit on 05/21/14  . EKG 12-Lead    ASSESSMENT AND PLAN:  Active Problems:   Ascites   Open wound of umbilical region #1. Umbilical hernia rupture due to gangrenous/erosed  Tip,draining peritoneal fluid was no obvious peritonitis, although wound cultures revealed Staphylococcus aureus, which could still be contamination from  the skin, appreciate surgery input  Patient is being continued on intravenous antibiotics, culture results with Staphylococcus aureus pansensitive ,  change to oral antibiotics to Bactrim  palliative care consultation is appreciated.   Surgery is recommending outpatient conservative management   #2. Ascites due to alcoholic liver disease, ongoing alcohol abuse, not much ascites is seen on ultrasound of abdomen due to spontaneous leakage, surgery commenced advance diuretics to dry patient's ascites as much as possible and consider surgical therapy, although risks of surgery and peritoneal leakage is significant #3. Hyponatremia, improved .Sodium at 130   #4. Coagulopathy, due to alcohol related liver disease, no obvious bleeding, although hemoglobin dropped down to about one half a gram, recheck in am  no bleeding was noted #5. Hypokalemia, resolved, magnesium level is normal   consult physical therapy   Management plans discussed with the patient and he is  in agreement.   DRUG ALLERGIES:  Allergies  Allergen Reactions  . Dilantin [Phenytoin Sodium Extended] Rash  . Penicillins Rash and Other (See Comments)    Has patient had a PCN reaction causing immediate rash, facial/tongue/throat swelling, SOB or lightheadedness with hypotension: No Has patient had a PCN reaction causing severe rash involving mucus membranes or skin necrosis: No Has patient had a PCN reaction that required hospitalization No Has patient had a PCN reaction occurring within the last 10 years: No If all of the  above answers are "NO", then may proceed with Cephalosporin use.    CODE STATUS:     Code Status Orders        Start     Ordered   03/12/16 2009  Full code   Continuous     03/12/16 2008    Code Status History    Date Active Date Inactive Code Status Order ID Comments User Context   This patient has a current code status but no historical code status.      TOTAL TIME TAKING CARE OF THIS PATIENT: 35  minutes.    Ramonita LabGouru, Kahdijah Errickson M.D on 03/15/2016 at 11:07 AM  Between 7am to 6pm - Pager - [phone]  After 6pm go to [email or web address] - password [email or web address]  [facility]  Office  [phone]  CC: Primary care physician; No PCP Per Patient

## 2016-03-15 NOTE — Consult Note (Signed)
I came to see patient and room empty, nurse said he had checked out of hospital.  If he comes back please notify me.

## 2016-03-16 DIAGNOSIS — S31105D Unspecified open wound of abdominal wall, periumbilic region without penetration into peritoneal cavity, subsequent encounter: Secondary | ICD-10-CM

## 2016-03-16 LAB — CBC
HCT: 35 % — ABNORMAL LOW (ref 40.0–52.0)
HEMOGLOBIN: 11.9 g/dL — AB (ref 13.0–18.0)
MCH: 26.9 pg (ref 26.0–34.0)
MCHC: 34.1 g/dL (ref 32.0–36.0)
MCV: 78.9 fL — ABNORMAL LOW (ref 80.0–100.0)
PLATELETS: 266 10*3/uL (ref 150–440)
RBC: 4.44 MIL/uL (ref 4.40–5.90)
RDW: 18.1 % — ABNORMAL HIGH (ref 11.5–14.5)
WBC: 7.6 10*3/uL (ref 3.8–10.6)

## 2016-03-16 LAB — BASIC METABOLIC PANEL
ANION GAP: 9 (ref 5–15)
BUN: 12 mg/dL (ref 6–20)
CO2: 26 mmol/L (ref 22–32)
Calcium: 7.9 mg/dL — ABNORMAL LOW (ref 8.9–10.3)
Chloride: 90 mmol/L — ABNORMAL LOW (ref 101–111)
Creatinine, Ser: 1.2 mg/dL (ref 0.61–1.24)
GLUCOSE: 104 mg/dL — AB (ref 65–99)
POTASSIUM: 4.5 mmol/L (ref 3.5–5.1)
SODIUM: 125 mmol/L — AB (ref 135–145)

## 2016-03-16 LAB — PHOSPHORUS: PHOSPHORUS: 3.3 mg/dL (ref 2.5–4.6)

## 2016-03-16 MED ORDER — TETRACAINE HCL 0.5 % OP SOLN
1.0000 [drp] | Freq: Once | OPHTHALMIC | Status: AC
  Administered 2016-03-16: 1 [drp] via OPHTHALMIC
  Filled 2016-03-16: qty 2

## 2016-03-16 MED ORDER — SODIUM CHLORIDE 0.9 % IV SOLN
INTRAVENOUS | Status: AC
  Administered 2016-03-16: 14:00:00 via INTRAVENOUS

## 2016-03-16 MED ORDER — NYSTATIN 100000 UNIT/GM EX POWD: Freq: Two times a day (BID) | CUTANEOUS | Status: DC

## 2016-03-16 MED ORDER — BISACODYL 5 MG PO TBEC: 5.0000 mg | DELAYED_RELEASE_TABLET | Freq: Every day | ORAL | Status: AC | PRN

## 2016-03-16 MED ORDER — SULFAMETHOXAZOLE-TRIMETHOPRIM 800-160 MG PO TABS: 1.0000 | ORAL_TABLET | Freq: Two times a day (BID) | ORAL | Status: AC

## 2016-03-16 MED ORDER — ACETAMINOPHEN 325 MG PO TABS: 650.0000 mg | ORAL_TABLET | Freq: Four times a day (QID) | ORAL | Status: DC | PRN

## 2016-03-16 MED ORDER — OFLOXACIN 0.3 % OP SOLN
1.0000 [drp] | Freq: Four times a day (QID) | OPHTHALMIC | Status: DC
  Administered 2016-03-16 – 2016-03-17 (×7): 1 [drp] via OPHTHALMIC
  Filled 2016-03-16: qty 5

## 2016-03-16 MED ORDER — OFLOXACIN 0.3 % OP SOLN: 1.0000 [drp] | Freq: Four times a day (QID) | OPHTHALMIC | Status: DC

## 2016-03-16 NOTE — Evaluation (Signed)
Physical Therapy Evaluation Patient Details Name: Jeff Preston MRN: [id number] DOB: 08-01-1946 Today's Date: 03/16/2016   History of Present Illness  70 y.o. male with a known history of Liver cirrhosis, ascites, hypertension, depression, GERD who presents with drainage of ascites from his umbilicus. Patient reports that it started a few days ago but it was little. He routinely gets paracentesis. Last paracentesis was 3 weeks ago. He is supposed to get another paracentesis next week. The drainage started to increase and now is draining a lot of ascitic fluid.  Clinical Impression  Pt was somewhat weaker and more limited than his baseline but he was able to walk farther than he typically does at any one time and ultimately is safe ambulating and negotiating stairs.  Pt has no abdominal pain but does c/o eye pain that kept him up last night.  Pt will be functionally able to go home w/o PT services.    Follow Up Recommendations No PT follow up    Equipment Recommendations       Recommendations for Other Services       Precautions / Restrictions Precautions Precautions: Fall Restrictions Weight Bearing Restrictions: No      Mobility  Bed Mobility Overal bed mobility: Independent             General bed mobility comments: Pt able to get to sitting at EOB w/o issue  Transfers Overall transfer level: Independent Equipment used: None             General transfer comment: Pt intially needing UEs to steady himself but is able to maintain standing balance and overall safety w/o assit  Ambulation/Gait Ambulation/Gait assistance: Supervision;Min guard Ambulation Distance (Feet): 150 Feet         General Gait Details: Pt is able to ambulate with no UE assist down the hallway. He reports walking slower than his baseline but is safe and has no LOBs.  Stairs Stairs: Yes Stairs assistance: Modified independent (Device/Increase time) Stair Management: One rail  Right Number of Stairs: 3 General stair comments: Pt heavily reliant on UEs but able to safely negotiate up/down steps.  Wheelchair Mobility    Modified Rankin (Stroke Patients Only)       Balance                                             Pertinent Vitals/Pain Pain Assessment:  (c/o severe L eye pain, no c/o abdominal pain)    Home Living Family/patient expects to be discharged to:: Private residence Living Arrangements: Children Available Help at Discharge: Family   Home Access: Stairs to enter Entrance Stairs-Rails: Right Entrance Stairs-Number of Steps: 2   Home Equipment: None      Prior Function Level of Independence: Independent         Comments: Pt does not get out of the house much, no driving, but able to cook, etc     Hand Dominance        Extremity/Trunk Assessment   Upper Extremity Assessment: Generalized weakness;Overall [facility] for tasks assessed           Lower Extremity Assessment: Generalized weakness;Overall WFL for tasks assessed         Communication   Communication: No difficulties  Cognition Arousal/Alertness: Awake/alert Behavior During Therapy: WFL for tasks assessed/performed Overall Cognitive Status: Within Functional Limits for tasks assessed  General Comments      Exercises        Assessment/Plan    PT Assessment Patient needs continued PT services  PT Diagnosis Difficulty walking;Generalized weakness   PT Problem List Decreased strength;Decreased range of motion;Decreased activity tolerance;Decreased balance;Decreased mobility;Decreased coordination;Decreased knowledge of use of DME;Decreased safety awareness  PT Treatment Interventions Gait training;Stair training;Therapeutic activities;Functional mobility training;Therapeutic exercise;Balance training   PT Goals (Current goals can be found in the Care Plan section) Acute Rehab PT Goals Patient Stated Goal: "I  want to go home and catch up on sleep." PT Goal Formulation: With patient Time For Goal Achievement: 03/30/16 Potential to Achieve Goals: Good    Frequency Min 2X/week   Barriers to discharge        Co-evaluation               End of Session Equipment Utilized During Treatment: Gait belt Activity Tolerance: Patient tolerated treatment well Patient left: with bed alarm set;with call bell/phone within reach           Time: 0919-0941 PT Time Calculation (min) (ACUTE ONLY): 22 min   Charges:   PT Evaluation $PT Eval Low Complexity: 1 Procedure     PT G CodesMalachi Pro:        Jeff Preston R Jaxyn Mestas, DPT 03/16/2016, 11:06 AM

## 2016-03-16 NOTE — Care Management Note (Signed)
Case Management Note  Patient Details  Name: Jeff Preston MRN: [id number] Date of Birth: [birth year]  Subjective/Objective:     Discharge information faxed and [facility]anda [facility].                Action/Plan:   Expected Discharge Date:                  Expected Discharge Plan:     In-House Referral:     Discharge planning Services     Post Acute Care Choice:    Choice offered to:     DME Arranged:    DME Agency:     HH Arranged:    HH Agency:     Status of Service:     Medicare Important Message Given:  Yes Date Medicare IM Given:    Medicare IM give by:    Date Additional Medicare IM Given:    Additional Medicare Important Message give by:     If discussed at Long Length of Stay Meetings, dates discussed:    Additional Comments:  Karysa Heft A, RN 03/16/2016, 11:13 AM

## 2016-03-16 NOTE — Discharge Instructions (Signed)
Continue structured umbilical hernia care as recommended by wound care Follow-up with primary care physician in 3-5 days Follow-up with surgery in a week Follow-up with gastroenterology in 2 weeks Low-salt diet

## 2016-03-16 NOTE — Care Management Note (Signed)
Case Management Note  Patient Details  Name: Jeff Preston MRN: [id number] Date of Birth: [birth year]  Subjective/Objective:    Discussed order for home health RN and Aid [facility] per previous Palliative Care referral today. Burna MortimerWanda advised that the order be faxed to her to discuss with the team on Monday.                 Action/Plan:   Expected Discharge Date:                  Expected Discharge Plan:     In-House Referral:     Discharge planning Services     Post Acute Care Choice:    Choice offered to:     DME Arranged:    DME Agency:     HH Arranged:    HH Agency:     Status of Service:     Medicare Important Message Given:  Yes Date Medicare IM Given:    Medicare IM give by:    Date Additional Medicare IM Given:    Additional Medicare Important Message give by:     If discussed at Long Length of Stay Meetings, dates discussed:    Additional Comments:  Elcie Pelster A, RN 03/16/2016, 3:19 PM

## 2016-03-16 NOTE — Consult Note (Signed)
Patient with conjunctivitis worse on left.  Abd still draining from umbilicus area into bag.  His scrotum has shrunk, it was about the size of 2/3 of a volley ball.  Na 125, hgb 11.9, plt 266,m WBC 7.6 Pulse 106, other vital signs ok.  Spoke to Dr. Amado CoeGouru about his eyes.  When he is discharged I will see him in follow up in 3-4 weeks.

## 2016-03-16 NOTE — Discharge Summary (Deleted)
190. Sodium is low chronically but for orders for IV fluids like a wave like to 50 in 2 hours and discharged [facility] [place] [facility]   PATIENT NAME: Jeff Preston    MR#:  [id number]  DATE OF BIRTH:  [birth year]  DATE OF ADMISSION:  03/12/2016 ADMITTING PHYSICIAN: Auburn Bilberry, MD  DATE OF DISCHARGE: 03/16/16 PRIMARY CARE PHYSICIAN: No PCP Per Patient    ADMISSION DIAGNOSIS:  Ascites [R18.8] Open wound of umbilical region, initial encounter [S31.105A]  DISCHARGE DIAGNOSIS:  Active Problems:   Ascites   Open wound of umbilical region Hyponatremia  SECONDARY DIAGNOSIS:   Past Medical History  Diagnosis Date  . Hypertension   . Depression   . GERD (gastroesophageal reflux disease)   . Cirrhosis (HCC)   . Anxiety     HOSPITAL COURSE:   #1. Umbilical hernia rupture due to gangrenous/erosed Tip,draining peritoneal fluid was no obvious peritonitis, although wound cultures revealed Staphylococcus aureus, which could still be contamination from the skin, appreciate surgery input Patient is being continued on intravenous antibiotics, culture results with Staphylococcus aureus pansensitive , change to oral antibiotics to Bactrim palliative care consultation is appreciated.  Surgery is recommending outpatient conservative management as patient is refusing surgery, pretty adamant on this decision though he is aware of the consequences of refusing surgery Continue to collect the peritoneal fluid leakage into the colostomy bag as recommended by the wound care  #2. 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 Patient refused surgery  #3. Hyponatremia, probably from chronic  alcoholism-sodium 125-130 Patient is asymptomatic  #4. Coagulopathy, due to alcohol related liver disease, no obvious bleeding, although hemoglobin dropped down to about one half a gram, recheck in am no bleeding was noted #5. Hypokalemia, resolved, magnesium level is normal  #Left eye conjunctivitis/conjunctival hemorrhage Erythromycin eyedrops  consult physical therapy -no PT needs identified  Will provide outpatient palliative care to continue wound care and further needs, as per my discussion with case management  Patient wants to be discharged home today, he is feeling stable and steady and wants to go home  Management plans discussed with the patient and he is in agreement.   DISCHARGE CONDITIONS:   Fair  CONSULTS OBTAINED:  Treatment Team:  Scot Jun, MD   PROCEDURES None  DRUG ALLERGIES:   Allergies  Allergen Reactions  . Dilantin [Phenytoin Sodium Extended] Rash  . Penicillins Rash and Other (See Comments)    Has patient had a PCN reaction causing immediate rash, facial/tongue/throat swelling, SOB or lightheadedness with hypotension: No Has patient had a PCN reaction causing severe rash involving mucus membranes or skin necrosis: No Has patient had a PCN reaction that required hospitalization No Has patient had a PCN reaction occurring within the last 10 years: No If all of the above answers are "NO", then may proceed with Cephalosporin use.    DISCHARGE MEDICATIONS:   Current Discharge Medication List    START taking these medications   Details  acetaminophen (TYLENOL) 325 MG tablet Take 2 tablets (650 mg total) by mouth every 6 (six) hours as needed for mild pain (or Fever >/= 101).    bisacodyl (DULCOLAX) 5 MG EC tablet Take 1 tablet (5 mg total) by mouth daily as needed  for moderate constipation. Qty: 30 tablet, Refills: 0    nystatin (NYSTATIN) powder Apply topically 2 (two) times daily. Qty: 15 g, Refills: 0    ofloxacin (OCUFLOX) 0.3 %  ophthalmic solution Place 1 drop into the left eye 4 (four) times daily. Qty: 5 mL, Refills: 0    sulfamethoxazole-trimethoprim (BACTRIM DS,SEPTRA DS) 800-160 MG tablet Take 1 tablet by mouth 2 (two) times daily. Qty: 28 tablet, Refills: 0      CONTINUE these medications which have NOT CHANGED   Details  furosemide (LASIX) 20 MG tablet Take 20 mg by mouth 2 (two) times daily.     LORazepam (ATIVAN) 0.5 MG tablet Take 0.5 mg by mouth 3 (three) times daily.     nadolol (CORGARD) 20 MG tablet Take 20 mg by mouth daily.    omeprazole (PRILOSEC) 20 MG capsule Take 20 mg by mouth 2 (two) times daily.     ondansetron (ZOFRAN-ODT) 4 MG disintegrating tablet Take 4 mg by mouth every 8 (eight) hours as needed for nausea or vomiting.    simethicone (MYLICON) 80 MG chewable tablet Chew 160-240 mg by mouth every 6 (six) hours as needed for flatulence.     spironolactone (ALDACTONE) 50 MG tablet Take 50 mg by mouth daily.    zolpidem (AMBIEN) 10 MG tablet Take 10 mg by mouth at bedtime as needed for sleep.         DISCHARGE INSTRUCTIONS:   Continue structured umbilical hernia care as recommended by wound care Follow-up with primary care physician in 3-5 days Follow-up with surgery in a week Follow-up with gastroenterology in 2 weeks Outpatient home health and palliative care will be continued  DIET:  2 g sodium diet  DISCHARGE CONDITION:  Fair  ACTIVITY:  Activity as tolerated  OXYGEN:  Home Oxygen: No.   Oxygen Delivery: room air  DISCHARGE LOCATION:  home   If you experience worsening of your admission symptoms, develop shortness of breath, life threatening emergency, suicidal or homicidal thoughts you must seek medical attention immediately by calling 911 or calling your MD immediately  if symptoms less severe.  You Must read complete instructions/literature along with all the possible adverse reactions/side effects for all the Medicines you take and that have been  prescribed to you. Take any new Medicines after you have completely understood and accpet all the possible adverse reactions/side effects.   Please note  You were cared for by a hospitalist during your hospital stay. If you have any questions about your discharge medications or the care you received while you were in the hospital after you are discharged, you can call the unit and asked to speak with the hospitalist on call if the hospitalist that took care of you is not available. Once you are discharged, your primary care physician will handle any further medical issues. Please note that NO REFILLS for any discharge medications will be authorized once you are discharged, as it is imperative that you return to your primary care physician (or establish a relationship with a primary care physician if you do not have one) for your aftercare needs so that they can reassess your need for medications and monitor your lab values.     Today  Chief Complaint  Patient presents with  . umbilical drainage    Patient is feeling weak but wants to go home and refusing surgery adamantly. Says that he is going to take care of himself  ROS:  CONSTITUTIONAL: Denies fevers, chills. Denies any fatigue, weakness.  EYES: Denies blurry vision, double vision, eye pain. EARS, NOSE, THROAT: Denies tinnitus, ear pain, hearing loss. RESPIRATORY: Denies cough, wheeze, shortness of breath.  CARDIOVASCULAR: Denies chest pain, palpitations, edema.  GASTROINTESTINAL: Denies nausea, vomiting, diarrhea, abdominal pain. Denies bright red blood per rectum. GENITOURINARY: Denies dysuria, hematuria. ENDOCRINE: Denies nocturia or thyroid problems. HEMATOLOGIC AND LYMPHATIC: Denies easy bruising or bleeding. SKIN: Denies rash or lesion. MUSCULOSKELETAL: Denies pain in neck, back, shoulder, knees, hips or arthritic symptoms.  NEUROLOGIC: Denies paralysis, paresthesias.  PSYCHIATRIC: Denies anxiety or depressive  symptoms.   VITAL SIGNS:  Blood pressure 120/61, pulse 63, temperature 98.2 F (36.8 C), temperature source Oral, resp. rate 18, height 5\' 7"  (1.702 m), weight 86.955 kg (191 lb 11.2 oz), SpO2 98 %.  I/O:    Intake/Output Summary (Last 24 hours) at 03/16/16 1411 Last data filed at 03/16/16 1334  Gross per 24 hour  Intake    360 ml  Output   1850 ml  Net  -1490 ml    PHYSICAL EXAMINATION:  GENERAL:  70 y.o.-year-old patient lying in the bed with no acute distress.  EYES: Pupils equal, round, reactive to light and accommodation. No scleral icterus. Extraocular muscles intact.  HEENT: Head atraumatic, normocephalic. Oropharynx and nasopharynx clear.  NECK:  Supple, no jugular venous distention. No thyroid enlargement, no tenderness.  LUNGS: Normal breath sounds bilaterally, no wheezing, rales,rhonchi or crepitation. No use of accessory muscles of respiration.  CARDIOVASCULAR: S1, S2 normal. No murmurs, rubs, or gallops.  ABDOMEN: Soft,nontender, nondistended. Bowel sounds present. No organomegaly or mass. Small umbilical hernia was noted with orifice, which is draining milky peritoneal fluid , no obvious purulence  EXTREMITIES: No pedal edema, cyanosis, or clubbing.  NEUROLOGIC: Cranial nerves II through XII are intact. Muscle strength 5/5 in all extremities. Sensation intact. Gait not checked.  PSYCHIATRIC: The patient is alert and oriented x 3.  SKIN: No obvious rash, lesion, or ulcer.   DATA REVIEW:   CBC  Recent Labs Lab 03/16/16 0451  WBC 7.6  HGB 11.9*  HCT 35.0*  PLT 266    Chemistries   Recent Labs Lab 03/14/16 0455 03/15/16 0520 03/16/16 0451  NA 131* 130* 125*  K 4.0 4.1 4.5  CL  --  93* 90*  CO2  --  27 26  GLUCOSE  --  120* 104*  BUN  --  10 12  CREATININE  --  1.18 1.20  CALCIUM  --  7.9* 7.9*  MG 2.1  --   --   AST  --  37  --   ALT  --  13*  --   ALKPHOS  --  96  --   BILITOT  --  0.8  --     Cardiac Enzymes No results for input(s):  TROPONINI in the last 168 hours.  Microbiology Results  Results for orders placed or performed during the hospital encounter of 03/12/16  Culture, body fluid-bottle     Status: Abnormal   Collection Time: 03/12/16  6:52 PM  Result Value Ref Range Status   Specimen Description PERITONEAL  Final   Special Requests NONE  Final   Gram Stain GRAM POSITIVE COCCI IN CLUSTERS  Final   Culture STAPHYLOCOCCUS AUREUS NO ANAEROBES ISOLATED  (A)  Final   Report Status 03/15/2016 FINAL  Final   Organism ID, Bacteria STAPHYLOCOCCUS AUREUS  Final      Susceptibility   Staphylococcus aureus - MIC*    CIPROFLOXACIN <=0.5 SENSITIVE Sensitive  ERYTHROMYCIN <=0.25 SENSITIVE Sensitive     GENTAMICIN <=0.5 SENSITIVE Sensitive     OXACILLIN 0.5 SENSITIVE Sensitive     TETRACYCLINE <=1 SENSITIVE Sensitive     VANCOMYCIN 1 SENSITIVE Sensitive     TRIMETH/SULFA <=10 SENSITIVE Sensitive     CLINDAMYCIN <=0.25 SENSITIVE Sensitive     RIFAMPIN <=0.5 SENSITIVE Sensitive     Inducible Clindamycin NEGATIVE Sensitive     * STAPHYLOCOCCUS AUREUS    RADIOLOGY:  US Abdomen Limited  03/13/2016  CLINICAL DATA:  70 year old male with a history of recurrent ascites. The patient presents to the emergency department with spontaneous drainage. He has been referred for paracentesis. EXAM: US ABDOMEN LIMITED - RIGHT UPPER QUADRANT COMPARISON:  None. FINDINGS: Ultrasound performed of the abdomen to identify a potential access for paracentesis. No ascites visualized. On physical exam, the patient appears to have spontaneous rupture of an umbilical hernia, with apparent trans cutaneous communication to the umbilical hernia sac. IMPRESSION: No significant ascites within the abdomen. On physical exam, the patient appears to have a spontaneous rupture of an umbilical hernia. Signed, Yvone Neu. Loreta Ave, DO [facility] Electronically Signed   By: Gilmer Mor D.O.   On:  03/13/2016 10:39    EKG:   Orders placed or performed in visit on 05/21/14  . EKG 12-Lead      Management plans discussed with the patient and he is in agreement. Very poor prognosis high risk of mortality and morbidity.  CODE STATUS:     Code Status Orders        Start     Ordered   03/14/16 1230  Do not attempt resuscitation (DNR)   Continuous    Question Answer Comment  In the event of cardiac or respiratory ARREST Do not call a "code blue"   In the event of cardiac or respiratory ARREST Do not perform Intubation, CPR, defibrillation or ACLS   In the event of cardiac or respiratory ARREST Use medication by any route, position, wound care, and other measures to relive pain and suffering. May use oxygen, suction and manual treatment of airway obstruction as needed for comfort.      03/14/16 1230    Code Status History    Date Active Date Inactive Code Status Order ID Comments User Context   03/12/2016  8:08 PM 03/14/2016 12:29 PM Full Code [id number]  Auburn Bilberry, MD ED      TOTAL TIME TAKING CARE OF THIS PATIENT: 45 minutes.    @[facility] @  on 03/16/2016 at 2:11 PM  Between 7am to 6pm - Pager - [phone]  After 6pm go to [email or web address] - password [email or web address] [facility]  [facility]  Office  [phone]  CC: Primary care physician; No PCP Per Patient

## 2016-03-16 NOTE — Progress Notes (Signed)
MEDICATION RELATED CONSULT NOTE   Pharmacy Consult for electrolyte monitoring  Allergies  Allergen Reactions  . Dilantin [Phenytoin Sodium Extended] Rash  . Penicillins Rash and Other (See Comments)    Has patient had a PCN reaction causing immediate rash, facial/tongue/throat swelling, SOB or lightheadedness with hypotension: No Has patient had a PCN reaction causing severe rash involving mucus membranes or skin necrosis: No Has patient had a PCN reaction that required hospitalization No Has patient had a PCN reaction occurring within the last 10 years: No If all of the above answers are "NO", then may proceed with Cephalosporin use.    Patient Measurements: Height: 5\' 7"  (170.2 cm) Weight: 191 lb 11.2 oz (86.955 kg) IBW/kg (Calculated) : 66.1  Intake/Output from previous day: 05/06 0701 - 05/07 0700 In: 720 [P.O.:720] Out: 1600 [Urine:1250] Intake/Output from this shift: Total I/O In: -  Out: 800 [Urine:450; Other:350]  Labs:  Recent Labs  03/13/16 1445 03/14/16 0455 03/15/16 0520 03/16/16 0451  WBC  --   --  5.8 7.6  HGB  --  11.3* 11.6* 11.9*  HCT  --   --  34.5* 35.0*  PLT  --   --  218 266  APTT  --   --  38*  --   CREATININE  --   --  1.18 1.20  MG  --  2.1  --   --   PHOS 2.4*  --   --  3.3  ALBUMIN  --   --  2.7*  --   PROT  --   --  6.4*  --   AST  --   --  37  --   ALT  --   --  13*  --   ALKPHOS  --   --  96  --   BILITOT  --   --  0.8  --     Lab Results  Component Value Date   K 4.5 03/16/2016   Estimated Creatinine Clearance: 62.1 mL/min (by C-G formula based on Cr of 1.2).  Assessment: Pharmacy consulted for electrolyte admitted with ascites/umbilical hernia rupture. Patient received potassium 160 mEq PO on 5/4. Patient currently ordered potassium 40mEq PO Daily.   Plan:  Will continue potassium 40mEq PO daily. Will obtain follow-up electrolytes with am labs.   5/6  K=4.1.  Patient currently ordered KCL 40meq daily x 2 days. (also on  Spironolactone).  5/7 AM K+ 4.5. Last dose of KCl this AM. F/u BMP tomorrow AM.  Pharmacy will continue to monitor and adjust per consult.    Murad Staples S 03/16/2016,6:12 AM

## 2016-03-16 NOTE — Progress Notes (Signed)
CC: Umbilical hernia Subjective: Patient with persistent drainage of ascites per a ruptured umbilical hernia. Patient denies any fevers, chills, nausea, vomiting, diarrhea, constipation, chest pain, shortness of breath. Only complaint today is of conjunctivitis of his left eye.  Objective: Vital signs in last 24 hours: Temp:  [98.5 F (36.9 C)-98.7 F (37.1 C)] 98.5 F (36.9 C) (05/07 0500) Pulse Rate:  [88-103] 103 (05/07 0725) Resp:  [18] 18 (05/07 0725) BP: (124-133)/(60-66) 130/66 mmHg (05/07 0725) SpO2:  [95 %-99 %] 99 % (05/07 0725) Last BM Date: 03/15/16  Intake/Output from previous day: 05/06 0701 - 05/07 0700 In: 720 [P.O.:720] Out: 1600 [Urine:1250] Intake/Output this shift: Total I/O In: -  Out: 150 [Urine:150]  Physical exam:  Gen.: No acute distress Chest: Clear to auscultation Heart: Regular Abdomen: Soft, nontender, nondistended. Ostomy appliance in place over draining umbilical hernia site. Draining straw-colored fluid. No evidence of spreading erythema, purulence, peritonitis.  Lab Results: CBC   Recent Labs  03/15/16 0520 03/16/16 0451  WBC 5.8 7.6  HGB 11.6* 11.9*  HCT 34.5* 35.0*  PLT 218 266   BMET  Recent Labs  03/15/16 0520 03/16/16 0451  NA 130* 125*  K 4.1 4.5  CL 93* 90*  CO2 27 26  GLUCOSE 120* 104*  BUN 10 12  CREATININE 1.18 1.20  CALCIUM 7.9* 7.9*   PT/INR  Recent Labs  03/15/16 0520  LABPROT 16.5*  INR 1.32   ABG No results for input(s): PHART, HCO3 in the last 72 hours.  Invalid input(s): PCO2, PO2  Studies/Results: No results found.  Anti-infectives: Anti-infectives    Start     Dose/Rate Route Frequency Ordered Stop   03/15/16 1145  sulfamethoxazole-trimethoprim (BACTRIM DS,SEPTRA DS) 800-160 MG per tablet 1 tablet     1 tablet Oral Every 12 hours 03/15/16 1114     03/13/16 1045  sulfamethoxazole-trimethoprim (BACTRIM DS,SEPTRA DS) 800-160 MG per tablet 1 tablet  Status:  Discontinued     1 tablet Oral  Every 12 hours 03/13/16 1043 03/13/16 1235   03/12/16 2015  cefTRIAXone (ROCEPHIN) 1 g in dextrose 5 % 50 mL IVPB  Status:  Discontinued     1 g 100 mL/hr over 30 Minutes Intravenous Every 24 hours 03/12/16 2010 03/15/16 1113   03/12/16 1945  cefTRIAXone (ROCEPHIN) 1 g in dextrose 5 % 50 mL IVPB    Comments:  After collecting ascites   1 g 100 mL/hr over 30 Minutes Intravenous STAT 03/12/16 1941 03/12/16 2107   03/12/16 1945  vancomycin (VANCOCIN) IVPB 1000 mg/200 mL premix     1,000 mg 200 mL/hr over 60 Minutes Intravenous  Once 03/12/16 1941 03/12/16 2202      Assessment/Plan:  70 year old male with cirrhosis, ascites, ruptured umbilical hernia. Had conversation with the patient today about his options to include surgical intervention. Again reiterated the risks of surgery that were provided to him by my partners. He voiced understanding and elects to not pursue any surgical intervention at this time. Discussed with him that he is allowed to change his mind and should he elect for surgical intervention for them to let us know as soon as possible. Patient has a very difficult problem in that the chronic drainage of his peritoneal fluid will likely worsen his electrolyte abnormalities and make him prone for bacterial peritonitis. No plans for surgical intervention at the patient's request. Disposition per the primary team.  Marzetta Boardharles T. Tonita CongWoodham, MD, FACS  03/16/2016

## 2016-03-17 LAB — BASIC METABOLIC PANEL
ANION GAP: 11 (ref 5–15)
BUN: 12 mg/dL (ref 6–20)
CALCIUM: 8.1 mg/dL — AB (ref 8.9–10.3)
CHLORIDE: 92 mmol/L — AB (ref 101–111)
CO2: 24 mmol/L (ref 22–32)
Creatinine, Ser: 1.27 mg/dL — ABNORMAL HIGH (ref 0.61–1.24)
GFR calc Af Amer: >60 mL/min (ref >60)
GFR calc non Af Amer: 56 mL/min — ABNORMAL LOW (ref >60)
GLUCOSE: 108 mg/dL — AB (ref 65–99)
Potassium: 4.6 mmol/L (ref 3.5–5.1)
Sodium: 127 mmol/L — ABNORMAL LOW (ref 135–145)

## 2016-03-17 MED ORDER — SODIUM CHLORIDE 0.9 % IV SOLN
INTRAVENOUS | Status: AC
  Administered 2016-03-17: 09:00:00 via INTRAVENOUS

## 2016-03-17 NOTE — Progress Notes (Signed)
Patient is being discharge in a stable condition teaching about ostomy care given by wound care nurse to pt , summary and f/u care given to both pt and sister , verbalized understanding , left with sister

## 2016-03-17 NOTE — Consult Note (Signed)
WOC wound consult note Reason for Consult:Umbilical hernia with ascites leak. Pouching at this time.  To discharge today.  Last teachign session.  Wound type:Umbilical hernia, leaking ascites Pressure Ulcer POA: N/A Measurement: 1 1/4" opening cut into pouch.  Barrier ring used to reinforce fit.  Wound bed:N/A Drainage (amount, consistency, odor) Clear fluid.  Periwound:Intact with barrier ring for added security Dressing procedure/placement/frequency:Patient able to measure and cut pouch to fit today.  Explained bedside drainage bag and to remove when up OOB.  Patient applied pouch, cut to fit and understands this process.  Will not follow at this time.  Please re-consult if needed. Discharging today.  Will enroll in Secure start.  Has medicaid that should cover cost of pouches and barrier rings.  Maple HudsonKaren Lasharn Bufkin RN BSN CWON Pager [phone]

## 2016-03-17 NOTE — Progress Notes (Addendum)
New referral for [facility] services at home following discharge. Writer met with Jeff Preston in his room, he was alert and interactive with Probation officer. Hospice services explained. Jeff Preston declined hospice services at this time. Of note he also declined Home health services.Patient did agree to have the Hospice referral center contact him "in a few days" to see how he was doing. He stated he felt that he "could take care of this " on his own and did not need help with his dressing or ostomy bag.  CMRN Hassan Rowan and attending physician Dr. Margaretmary Eddy  notified. Plan is for patient to discharge home today. He lives with his son and his sister assists him with his care. Thank you. Flo Shanks, RN, BSN, [facility] [place], [facility] [phone] c

## 2016-03-17 NOTE — Progress Notes (Signed)
MEDICATION RELATED CONSULT NOTE   Pharmacy Consult for electrolyte monitoring  Allergies  Allergen Reactions  . Dilantin [Phenytoin Sodium Extended] Rash  . Penicillins Rash and Other (See Comments)    Has patient had a PCN reaction causing immediate rash, facial/tongue/throat swelling, SOB or lightheadedness with hypotension: No Has patient had a PCN reaction causing severe rash involving mucus membranes or skin necrosis: No Has patient had a PCN reaction that required hospitalization No Has patient had a PCN reaction occurring within the last 10 years: No If all of the above answers are "NO", then may proceed with Cephalosporin use.    Patient Measurements: Height: 5\' 7"  (170.2 cm) Weight: 191 lb 11.2 oz (86.955 kg) IBW/kg (Calculated) : 66.1  Intake/Output from previous day: 05/07 0701 - 05/08 0700 In: 360 [P.O.:360] Out: 1850 [Urine:1050] Intake/Output from this shift: Total I/O In: 240 [P.O.:240] Out: 400 [Urine:400]  Labs:  Recent Labs  03/15/16 0520 03/16/16 0451 03/17/16 0500  WBC 5.8 7.6  --   HGB 11.6* 11.9*  --   HCT 34.5* 35.0*  --   PLT 218 266  --   APTT 38*  --   --   CREATININE 1.18 1.20 1.27*  PHOS  --  3.3  --   ALBUMIN 2.7*  --   --   PROT 6.4*  --   --   AST 37  --   --   ALT 13*  --   --   ALKPHOS 96  --   --   BILITOT 0.8  --   --     Lab Results  Component Value Date   K 4.6 03/17/2016   Estimated Creatinine Clearance: 58.7 mL/min (by C-G formula based on Cr of 1.27).  Assessment: Pharmacy consulted for electrolyte management, admitted with ascites/umbilical hernia rupture.  5/8 K=4.6  Plan:  No supplementation needed at this time.  Pharmacy will continue to monitor and adjust per consult.    Clovia CuffLisa Marylyn Appenzeller, PharmD, BCPS 03/17/2016 10:55 AM

## 2016-03-17 NOTE — Care Management (Signed)
Text message to Dayna BarkerKaren Robertson RN representative [facility] [place].  Maple HudsonKaren Sanders RN Wound Care nurse to instruct [name] and sister on the care of the abdominal dressing. Discharge to home today per Dr. Amado CoeGouru. Gwenette GreetBrenda S Quanetta Truss RN MSN CCM Care Management [phone]

## 2016-03-17 NOTE — Care Management Important Message (Signed)
Important Message  Patient Details  Name: Jeff BreslowDouglas W Preston MRN: [id number] Date of Birth: 04/20/1946   Medicare Important Message Given:  Yes    Olegario MessierKathy A Korrina Zern 03/17/2016, 10:38 AM

## 2016-03-17 NOTE — Progress Notes (Signed)
[facility] - [place] at [facility]   PATIENT NAME: Jeff Preston    MR#:  [id number]  DATE OF BIRTH:  [birth year]  SUBJECTIVE:  CHIEF COMPLAINT:  Patient is 70 year old Caucasian male with a past medical history significant for history of alcoholic liver cirrhosis, ascites, essential hypertension, depression, gastroesophageal reflux disease who presented to the hospital with drainage from his umbilicus, which started approximately a few days ago. His last paracentesis was about 3 weeks ago. He was supposed to get another paracentesis next week. However, due to increased drainage. He decided to come to emergency room for further evaluation and therapy. Patient denies any fevers, abdominal pain at present. Patient underwent ultrasound of abdomen which revealed a spontaneous rupture of umbilical hernia and minimal amount of ascites, surgical consultation was obtained. Surgery saw patient in consultation, however, patient refuses any operative therapy, transfer to tertiary care center.cultures from 03/12/2016 revealed Staphylococcus aureus, patient now is on the Rocephin and vancomycin intravenously, no fevers or abdominal pain. Received care discussed with patient CODE STATUS and decided on DO NOT RESUSCITATE code.  Today during my examination patient is asking to discharge him home and working with physical therapy    REVIEW OF SYSTEMS:  CONSTITUTIONAL: No fever, fatigue or weakness.  EYES: No blurred or double vision.  EARS, NOSE, AND THROAT: No tinnitus or ear pain.  RESPIRATORY: No cough, shortness of breath, wheezing or hemoptysis.  CARDIOVASCULAR: No chest pain, orthopnea, edema.  GASTROINTESTINAL: No nausea, vomiting, diarrhea or abdominal pain.  GENITOURINARY: No dysuria, hematuria.  ENDOCRINE: No polyuria, nocturia,  HEMATOLOGY: No anemia, easy bruising or bleeding SKIN: No rash or lesion. MUSCULOSKELETAL: No joint pain or arthritis.   NEUROLOGIC: No tingling,  numbness, weakness.  PSYCHIATRY: No anxiety or depression.   DRUG ALLERGIES:   Allergies  Allergen Reactions  . Dilantin [Phenytoin Sodium Extended] Rash  . Penicillins Rash and Other (See Comments)    Has patient had a PCN reaction causing immediate rash, facial/tongue/throat swelling, SOB or lightheadedness with hypotension: No Has patient had a PCN reaction causing severe rash involving mucus membranes or skin necrosis: No Has patient had a PCN reaction that required hospitalization No Has patient had a PCN reaction occurring within the last 10 years: No If all of the above answers are "NO", then may proceed with Cephalosporin use.    VITALS:  Blood pressure 101/48, pulse 55, temperature 98.7 F (37.1 C), temperature source Oral, resp. rate 20, height  (1.702 m), weight 86.955 kg (191 lb 11.2 oz), SpO2 98 %.  PHYSICAL EXAMINATION:  GENERAL:  70 y.o.-year-old patient lying in the bed with no acute distress.  EYES: Pupils equal, round, reactive to light and accommodation. No scleral icterus. Extraocular muscles intact.  HEENT: Head atraumatic, normocephalic. Oropharynx and nasopharynx clear.  NECK:  Supple, no jugular venous distention. No thyroid enlargement, no tenderness.  LUNGS: Normal breath sounds bilaterally, no wheezing, rales,rhonchi or crepitation. No use of accessory muscles of respiration.  CARDIOVASCULAR: S1, S2 normal. No murmurs, rubs, or gallops.  ABDOMEN: Soft, nontender, nondistended. Bowel sounds present. No organomegaly or mass. Small umbilical hernia was noted with orifice, which is draining milky peritoneal fluid , no obvious purulence  EXTREMITIES: No pedal edema, cyanosis, or clubbing.  NEUROLOGIC: Cranial nerves II through XII are intact. Muscle strength 5/5 in all extremities. Sensation intact. Gait not checked.  PSYCHIATRIC: The patient is alert and oriented x 3.  SKIN: No obvious rash, lesion, or ulcer.    LABORATORY PANEL:  CBC  Recent  Labs Lab 03/16/16 0451  WBC 7.6  HGB 11.9*  HCT 35.0*  PLT 266   ------------------------------------------------------------------------------------------------------------------  Chemistries   Recent Labs Lab 03/14/16 0455 03/15/16 0520  03/17/16 0500  NA 131* 130*  < > 127*  K 4.0 4.1  < > 4.6  CL  --  93*  < > 92*  CO2  --  27  < > 24  GLUCOSE  --  120*  < > 108*  BUN  --  10  < > 12  CREATININE  --  1.18  < > 1.27*  CALCIUM  --  7.9*  < > 8.1*  MG 2.1  --   --   --   AST  --  37  --   --   ALT  --  13*  --   --   ALKPHOS  --  96  --   --   BILITOT  --  0.8  --   --   < > = values in this interval not displayed. ------------------------------------------------------------------------------------------------------------------  Cardiac Enzymes No results for input(s): TROPONINI in the last 168 hours. ------------------------------------------------------------------------------------------------------------------  RADIOLOGY:  No results found.  EKG:   Orders placed or performed in visit on 05/21/14  . EKG 12-Lead    ASSESSMENT AND PLAN:   #1. Umbilical hernia rupture due to gangrenous/erosed Tip,draining peritoneal fluid was no obvious peritonitis, although wound cultures revealed Staphylococcus aureus, which could still be contamination from the skin, appreciate surgery input Patient is being continued on intravenous antibiotics, culture results with Staphylococcus aureus pansensitive , change to oral antibiotics to Bactrim palliative care consultation is appreciated.  Surgery is recommending outpatient conservative management as patient is refusing surgery, pretty adamant on this decision though he is aware of the consequences of refusing surgery Continue to collect the peritoneal fluid leakage into the colostomy bag as recommended by the wound care  #2. Ascites due to alcoholic liver disease, ongoing alcohol abuse, not much ascites is seen on ultrasound  of abdomen due to spontaneous leakage, surgery commenced advance diuretics to dry patient's ascites as much as possible and consider surgical therapy, although risks of surgery and peritoneal leakage is significant. Patient refused surgery  #3. Hyponatremia, probably from chronic alcoholism-sodium 125-130 Patient is asymptomatic  #4. Coagulopathy, due to alcohol related liver disease, no obvious bleeding, although hemoglobin dropped down to about one half a gram, recheck in am no bleeding was noted #5. Hypokalemia, resolved, magnesium level is normal  #Left eye conjunctivitis/conjunctival hemorrhage Erythromycin eyedrops  consult physical therapy -no PT needs identified  Will provide outpatient palliative care to continue wound care and further needs, as per my discussion with case management  Patient wants to be discharged home , he is feeling stable and steady and wants to go home discussed with patient's sister [place] who is concerned about his safety as nobody is there to take care of him at home and requested to discharge patient tomorrow after teaching them how to take care of the wound  Plan to discharge patient in a.m. after wound care training,  Management plans discussed with the patient and he is in agreement.      All the records are reviewed and case discussed with Care Management/Social Workerr. Management plans discussed with the patient, family and they are in agreement.  CODE STATUS:DO NOT RESUSCITATE  TOTAL TIME TAKING CARE OF THIS PATIENT: 35  minutes.   POSSIBLE D/C IN  a.m. DAYS, DEPENDING ON CLINICAL  CONDITION.   Ramonita LabGouru, Namita Yearwood M.D on 03/17/2016 at 2:14 PM  Between 7am to 6pm - Pager - [phone] After 6pm go to [email or web address] - password [email or web address]  [facility]  Office  [phone]  CC: Primary care physician; No PCP Per Patient

## 2016-03-17 NOTE — Discharge Summary (Addendum)
PATIENT NAME: Jeff Preston    MR#:  [id number]  DATE OF BIRTH:  11-24-1945  DATE OF ADMISSION:  03/12/2016 ADMITTING PHYSICIAN: Auburn Bilberry, MD  DATE OF DISCHARGE: 03/17/16 PRIMARY CARE PHYSICIAN: No PCP Per Patient    ADMISSION DIAGNOSIS:  Ascites [R18.8] Open wound of umbilical region, initial encounter [S31.105A]  DISCHARGE DIAGNOSIS:  Active Problems:   Ascites   Open wound of umbilical region HyponatremiaSecondary to peritoneal fluid leakage and beer potomania  SECONDARY DIAGNOSIS:   Past Medical History  Diagnosis Date  . Hypertension   . Depression   . GERD (gastroesophageal reflux disease)   . Cirrhosis (HCC)   . Anxiety     HOSPITAL COURSE:   #1. Umbilical hernia rupture due to gangrenous/erosed Tip,draining peritoneal fluid was no obvious peritonitis, although wound cultures revealed Staphylococcus aureus, which could still be contamination from the skin, appreciate surgery input Patient is being continued on intravenous antibiotics, culture results with Staphylococcus aureus pansensitive , change to oral antibiotics to [facility] discharge patient with by mouth Bactrim palliative care consultation is appreciated. Case management to arrange home hospice Surgery is recommending outpatient conservative management as patient is refusing surgery, pretty adamant on this decision though he is aware of the consequences of refusing surgery Continue to collect the peritoneal fluid leakage into the colostomy bag as recommended by the wound care . Wound care nurse has strained both the patient and his sister [place] regarding wound care  #2. 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 Patient  refused surgery  #3. Hyponatremia, probably from chronic alcoholism as well as peritoneal fluid leakage from the ruptured on the local hernia-sodium 125-130, today's sodium is at 127 Patient is asymptomatic  #4. Coagulopathy, due to alcohol related liver disease, no obvious bleeding, although hemoglobin dropped down to about one half a gram, recheck in am no bleeding was noted  #5. Hypokalemia, resolved, magnesium level is normal  #Left eye conjunctivitis/conjunctival hemorrhage Ofloxacin eyedrops  consult physical therapy -no PT needs identified  Will provide outpatient palliative care with home hospice, continue wound care and further needs, as per my discussion with case management  Patient wants to be discharged home today, he is feeling stable and steady and wants to go home. Discussed with patient's sister Ms. [place], she is agreeable with this plan and she is aware that patient is refusing surgery  Management plans discussed with the patient and he is in agreement.   DISCHARGE CONDITIONS:   Fair  CONSULTS OBTAINED:  Treatment Team:  Scot Jun, MD   PROCEDURES None  DRUG ALLERGIES:   Allergies  Allergen Reactions  . Dilantin [Phenytoin Sodium Extended] Rash  . Penicillins Rash and Other (See Comments)    Has patient had a PCN reaction causing immediate rash, facial/tongue/throat swelling, SOB or lightheadedness with hypotension: No Has patient had a PCN reaction causing severe rash involving mucus membranes or skin necrosis: No Has patient had a PCN reaction that required hospitalization No Has patient had a PCN reaction occurring within the last 10 years: No If all of the above answers are "NO", then may proceed with Cephalosporin use.    DISCHARGE MEDICATIONS:   Current Discharge Medication List    START taking these medications   Details  acetaminophen (TYLENOL)  325 MG tablet Take 2 tablets (650 mg total) by mouth every 6 (six) hours as needed  for mild pain (or Fever >/= 101).    bisacodyl (DULCOLAX) 5 MG EC tablet Take 1 tablet (5 mg total) by mouth daily as needed for moderate constipation. Qty: 30 tablet, Refills: 0    nystatin (NYSTATIN) powder Apply topically 2 (two) times daily. Qty: 15 g, Refills: 0    ofloxacin (OCUFLOX) 0.3 % ophthalmic solution Place 1 drop into the left eye 4 (four) times daily. Qty: 5 mL, Refills: 0    sulfamethoxazole-trimethoprim (BACTRIM DS,SEPTRA DS) 800-160 MG tablet Take 1 tablet by mouth 2 (two) times daily. Qty: 28 tablet, Refills: 0      CONTINUE these medications which have NOT CHANGED   Details  furosemide (LASIX) 20 MG tablet Take 20 mg by mouth 2 (two) times daily.     LORazepam (ATIVAN) 0.5 MG tablet Take 0.5 mg by mouth 3 (three) times daily.     nadolol (CORGARD) 20 MG tablet Take 20 mg by mouth daily.    omeprazole (PRILOSEC) 20 MG capsule Take 20 mg by mouth 2 (two) times daily.     ondansetron (ZOFRAN-ODT) 4 MG disintegrating tablet Take 4 mg by mouth every 8 (eight) hours as needed for nausea or vomiting.    simethicone (MYLICON) 80 MG chewable tablet Chew 160-240 mg by mouth every 6 (six) hours as needed for flatulence.     spironolactone (ALDACTONE) 50 MG tablet Take 50 mg by mouth daily.    zolpidem (AMBIEN) 10 MG tablet Take 10 mg by mouth at bedtime as needed for sleep.         DISCHARGE INSTRUCTIONS:   Continue structured umbilical hernia care as recommended by wound care Follow-up with primary care physician in 3-5 days Follow-up with surgery in a week Follow-up with gastroenterology in 2 weeks Outpatient home health and palliative care will be continued  DIET:  2 g sodium diet  DISCHARGE CONDITION:  Fair  ACTIVITY:  Activity as tolerated  OXYGEN:  Home Oxygen: No.   Oxygen Delivery: room air  DISCHARGE LOCATION:  home   If you experience worsening of your admission symptoms, develop shortness of breath, life threatening emergency,  suicidal or homicidal thoughts you must seek medical attention immediately by calling 911 or calling your MD immediately  if symptoms less severe.  You Must read complete instructions/literature along with all the possible adverse reactions/side effects for all the Medicines you take and that have been prescribed to you. Take any new Medicines after you have completely understood and accpet all the possible adverse reactions/side effects.   Please note  You were cared for by a hospitalist during your hospital stay. If you have any questions about your discharge medications or the care you received while you were in the hospital after you are discharged, you can call the unit and asked to speak with the hospitalist on call if the hospitalist that took care of you is not available. Once you are discharged, your primary care physician will handle any further medical issues. Please note that NO REFILLS for any discharge medications will be authorized once you are discharged, as it is imperative that you return to your primary care physician (or establish a relationship with a primary care physician if you do not have one) for your aftercare needs so that they can reassess your need for medications and monitor your lab values.     Today  Chief Complaint  Patient presents with  . umbilical drainage     Patient is 70 year old Caucasian male with a past medical history significant for history of alcoholic liver cirrhosis, ascites, essential hypertension, depression, gastroesophageal reflux disease who presented to the hospital with drainage from his umbilicus, which started approximately a few days ago. His last paracentesis was about 3 weeks ago. He was supposed to get another paracentesis next week. However, due to increased drainage. He decided to come to emergency room for further evaluation and therapy. Patient denies any fevers, abdominal pain at present. Patient underwent ultrasound of abdomen which  revealed a spontaneous rupture of umbilical hernia and minimal amount of ascites, surgical consultation was obtained. Surgery saw patient in consultation, however, patient refuses any operative therapy, transfer to tertiary care center.cultures from 03/12/2016 revealed Staphylococcus aureus, patient now is on the Rocephin and vancomycin intravenously, no fevers or abdominal pain. Received care discussed with patient CODE STATUS and decided on DO NOT RESUSCITATE code.    Patient is feeling fine and wants to go home refusing surgery adamantly. Says that he is going to take care of himself patient's sister is agreeable  ROS:  CONSTITUTIONAL: Denies fevers, chills. Denies any fatigue, weakness.  EYES: Denies blurry vision, double vision, eye pain. EARS, NOSE, THROAT: Denies tinnitus, ear pain, hearing loss. RESPIRATORY: Denies cough, wheeze, shortness of breath.  CARDIOVASCULAR: Denies chest pain, palpitations, edema.  GASTROINTESTINAL: Denies nausea, vomiting, diarrhea, abdominal pain. Denies bright red blood per rectum. GENITOURINARY: Denies dysuria, hematuria. ENDOCRINE: Denies nocturia or thyroid problems. HEMATOLOGIC AND LYMPHATIC: Denies easy bruising or bleeding. SKIN: Denies rash or lesion. MUSCULOSKELETAL: Denies pain in neck, back, shoulder, knees, hips or arthritic symptoms.  NEUROLOGIC: Denies paralysis, paresthesias.  PSYCHIATRIC: Denies anxiety or depressive symptoms.   VITAL SIGNS:  Blood pressure 101/48, pulse 55, temperature 98.7 F (37.1 C), temperature source Oral, resp. rate 20, height  (1.702 m), weight 86.955 kg (191 lb 11.2 oz), SpO2 98 %.  I/O:    Intake/Output Summary (Last 24 hours) at 03/17/16 1412 Last data filed at 03/17/16 1300  Gross per 24 hour  Intake    980 ml  Output   1800 ml  Net   -820 ml    PHYSICAL EXAMINATION:  GENERAL:  70 y.o.-year-old patient lying in the bed with no acute distress.  EYES: Pupils equal, round, reactive to light and  accommodation. No scleral icterus. Extraocular muscles intact.  HEENT: Head atraumatic, normocephalic. Oropharynx and nasopharynx clear.  NECK:  Supple, no jugular venous distention. No thyroid enlargement, no tenderness.  LUNGS: Normal breath sounds bilaterally, no wheezing, rales,rhonchi or crepitation. No use of accessory muscles of respiration.  CARDIOVASCULAR: S1, S2 normal. No murmurs, rubs, or gallops.  ABDOMEN: Soft,nontender, nondistended. Bowel sounds present. No organomegaly or mass. Small umbilical hernia was noted with orifice, which is draining milky peritoneal fluid , no obvious purulence  EXTREMITIES: No pedal edema, cyanosis, or clubbing.  NEUROLOGIC: Cranial nerves II through XII are intact. Muscle strength 5/5 in all extremities. Sensation intact. Gait not checked.  PSYCHIATRIC: The patient is alert and oriented x 3.  SKIN: No obvious rash, lesion, or ulcer.   DATA REVIEW:   CBC  Recent Labs Lab 03/16/16 0451  WBC 7.6  HGB 11.9*  HCT 35.0*  PLT 266    Chemistries   Recent Labs Lab 03/14/16 0455 03/15/16 0520  03/17/16 0500  NA 131* 130*  < > 127*  K 4.0 4.1  < > 4.6  CL  --  93*  < > 92*  CO2  --  27  < > 24  GLUCOSE  --  120*  < > 108*  BUN  --  10  < > 12  CREATININE  --  1.18  < > 1.27*  CALCIUM  --  7.9*  < > 8.1*  MG 2.1  --   --   --   AST  --  37  --   --   ALT  --  13*  --   --   ALKPHOS  --  96  --   --   BILITOT  --  0.8  --   --   < > = values in this interval not displayed.  Cardiac Enzymes No results for input(s): TROPONINI in the last 168 hours.  Microbiology Results  Results for orders placed or performed during the hospital encounter of 03/12/16  Culture, body fluid-bottle     Status: Abnormal   Collection Time: 03/12/16  6:52 PM  Result Value Ref Range Status   Specimen Description PERITONEAL  Final   Special Requests NONE  Final   Gram Stain GRAM POSITIVE COCCI IN CLUSTERS  Final   Culture STAPHYLOCOCCUS AUREUS NO  ANAEROBES ISOLATED  (A)  Final   Report Status 03/15/2016 FINAL  Final   Organism ID, Bacteria STAPHYLOCOCCUS AUREUS  Final      Susceptibility   Staphylococcus aureus - MIC*    CIPROFLOXACIN <=0.5 SENSITIVE Sensitive     ERYTHROMYCIN <=0.25 SENSITIVE Sensitive     GENTAMICIN <=0.5 SENSITIVE Sensitive     OXACILLIN 0.5 SENSITIVE Sensitive     TETRACYCLINE <=1 SENSITIVE Sensitive     VANCOMYCIN 1 SENSITIVE Sensitive     TRIMETH/SULFA <=10 SENSITIVE Sensitive     CLINDAMYCIN <=0.25 SENSITIVE Sensitive     RIFAMPIN <=0.5 SENSITIVE Sensitive     Inducible Clindamycin NEGATIVE Sensitive     * STAPHYLOCOCCUS AUREUS    RADIOLOGY:  No results found.  EKG:   Orders placed or performed in visit on 05/21/14  . EKG 12-Lead      Management plans discussed with the patient and his sister Ms.[place], they are in agreement. Very poor prognosis high risk of mortality and morbidity.  CODE STATUS:     Code Status Orders        Start     Ordered   03/14/16 1230  Do not attempt resuscitation (DNR)   Continuous    Question Answer Comment  In the event of cardiac or respiratory ARREST Do not call a "code blue"   In the event of cardiac or respiratory ARREST Do not perform Intubation, CPR, defibrillation or ACLS   In the event of cardiac or respiratory ARREST Use medication by any route, position, wound care, and other measures to relive pain and suffering. May use oxygen, suction and manual treatment of airway obstruction as needed for comfort.      03/14/16 1230    Code Status History    Date Active Date Inactive Code Status Order ID Comments User Context   03/12/2016  8:08 PM 03/14/2016 12:29 PM Full Code [id number]  Auburn Bilberry, MD ED      TOTAL TIME TAKING CARE OF THIS PATIENT: 45 minutes.    @[facility] @  on 03/17/2016 at 2:12 PM  Between 7am to 6pm - Pager - [phone]  After 6pm go to [email or web address] - password [email or web address] [facility]  [place] [facility]  Office   [phone]  CC: Primary care physician; No  PCP Per Patient

## 2016-03-18 ENCOUNTER — Ambulatory Visit: Payer: Medicare Other

## 2016-03-26 ENCOUNTER — Ambulatory Visit: Payer: Self-pay | Admitting: Surgery

## 2016-04-16 ENCOUNTER — Ambulatory Visit
Admission: RE | Admit: 2016-04-16 | Discharge: 2016-04-16 | Disposition: A | Discharge status: Home / Self Care | Payer: Medicare Other | Source: Ambulatory Visit | Attending: Unknown Physician Specialty | Admitting: Unknown Physician Specialty

## 2016-04-16 DIAGNOSIS — R188 Other ascites: Secondary | ICD-10-CM | POA: Diagnosis not present

## 2016-04-16 DIAGNOSIS — K7031 Alcoholic cirrhosis of liver with ascites: Secondary | ICD-10-CM

## 2016-04-16 MED ORDER — ALBUMIN HUMAN 25 % IV SOLN
25.0000 g | Freq: Once | INTRAVENOUS | Status: AC
  Administered 2016-04-16: 25 g via INTRAVENOUS
  Filled 2016-04-16: qty 100

## 2016-04-16 NOTE — OR Nursing (Signed)
Colostomy bag changed (it was leaking pt requested change) he said he had trouble finding bags so was supplied with 4 additional Holister colostomy bags for home supply

## 2016-04-24 ENCOUNTER — Emergency Department
Admission: EM | Admit: 2016-04-24 | Discharge: 2016-04-24 | Disposition: A | Discharge status: Home / Self Care | Payer: Medicare Other | Source: Home / Self Care | Attending: Emergency Medicine | Admitting: Emergency Medicine

## 2016-04-24 ENCOUNTER — Encounter: Payer: Self-pay | Admitting: Emergency Medicine

## 2016-04-24 DIAGNOSIS — Y849 Medical procedure, unspecified as the cause of abnormal reaction of the patient, or of later complication, without mention of misadventure at the time of the procedure: Secondary | ICD-10-CM

## 2016-04-24 DIAGNOSIS — I10 Essential (primary) hypertension: Secondary | ICD-10-CM

## 2016-04-24 DIAGNOSIS — F329 Major depressive disorder, single episode, unspecified: Secondary | ICD-10-CM

## 2016-04-24 DIAGNOSIS — Z79899 Other long term (current) drug therapy: Secondary | ICD-10-CM | POA: Insufficient documentation

## 2016-04-24 DIAGNOSIS — R188 Other ascites: Secondary | ICD-10-CM

## 2016-04-24 DIAGNOSIS — Z87891 Personal history of nicotine dependence: Secondary | ICD-10-CM

## 2016-04-24 DIAGNOSIS — S31105D Unspecified open wound of abdominal wall, periumbilic region without penetration into peritoneal cavity, subsequent encounter: Secondary | ICD-10-CM

## 2016-04-24 DIAGNOSIS — E871 Hypo-osmolality and hyponatremia: Secondary | ICD-10-CM | POA: Diagnosis not present

## 2016-04-24 DIAGNOSIS — S31602D Unspecified open wound of abdominal wall, epigastric region with penetration into peritoneal cavity, subsequent encounter: Secondary | ICD-10-CM | POA: Insufficient documentation

## 2016-04-24 LAB — BODY FLUID CELL COUNT WITH DIFFERENTIAL
Eos, Fluid: 0 %
LYMPHS FL: 25 %
MONOCYTE-MACROPHAGE-SEROUS FLUID: 21 %
NEUTROPHIL FLUID: 54 %
WBC FLUID: 573 uL

## 2016-04-24 NOTE — ED Notes (Signed)
New bag placed over umbilical stoma.

## 2016-04-24 NOTE — ED Provider Notes (Signed)
[facility] Emergency Department Provider Note  ____________________________________________  Time seen: On EMS arrival  I have reviewed the triage vital signs and the nursing notes.   HISTORY  Chief Complaint No chief complaint on file.   History limited by: Not Limited   HPI Jeff Preston is a 70 y.o. male with history of cirrhosis and ascites withhistory of umbilical rupture with draining a side ache fluid who presents to the emergency department today because concern for mild function of colostomy bag ascitic fluid containment system. The patient states that he think he dislodged it at night. He states that he does have extra bags at home however was scared to change it this morning. He denies any new abdominal pain, fevers, chest pain or shortness of breath.    Past Medical History  Diagnosis Date  . Hypertension   . Depression   . GERD (gastroesophageal reflux disease)   . Cirrhosis (HCC)   . Anxiety     Patient Active Problem List   Diagnosis Date Noted  . Open wound of umbilical region   . Ascites 03/12/2016    Past Surgical History  Procedure Laterality Date  . Throat surgery    . Cholecystectomy      Current Outpatient Rx  Name  Route  Sig  Dispense  Refill  . acetaminophen (TYLENOL) 325 MG tablet   Oral   Take 2 tablets (650 mg total) by mouth every 6 (six) hours as needed for mild pain (or Fever >/= 101).         . bisacodyl (DULCOLAX) 5 MG EC tablet   Oral   Take 1 tablet (5 mg total) by mouth daily as needed for moderate constipation.   30 tablet   0   . furosemide (LASIX) 20 MG tablet   Oral   Take 20 mg by mouth 2 (two) times daily.          Marland Kitchen LORazepam (ATIVAN) 0.5 MG tablet   Oral   Take 0.5 mg by mouth 3 (three) times daily.          . nadolol (CORGARD) 20 MG tablet   Oral   Take 20 mg by mouth daily.         Marland Kitchen nystatin (NYSTATIN) powder   Topical   Apply topically 2 (two) times daily.   15 g   0   . ofloxacin (OCUFLOX) 0.3 % ophthalmic solution   Left Eye   Place 1 drop into the left eye 4 (four) times daily.   5 mL   0   . omeprazole (PRILOSEC) 20 MG capsule   Oral   Take 20 mg by mouth 2 (two) times daily.          . ondansetron (ZOFRAN-ODT) 4 MG disintegrating tablet   Oral   Take 4 mg by mouth every 8 (eight) hours as needed for nausea or vomiting.         . simethicone (MYLICON) 80 MG chewable tablet   Oral   Chew 160-240 mg by mouth every 6 (six) hours as needed for flatulence.          Marland Kitchen spironolactone (ALDACTONE) 50 MG tablet   Oral   Take 50 mg by mouth daily.         Marland Kitchen zolpidem (AMBIEN) 10 MG tablet   Oral   Take 10 mg by mouth at bedtime as needed for sleep.           Allergies  Dilantin and Penicillins  Family History  Problem Relation Age of Onset  . CAD Mother     Social History Social History  Substance Use Topics  . Smoking status: Former Games developermoker  . Smokeless tobacco: Not on file  . Alcohol Use: No    Review of Systems  Constitutional: Negative for fever. Cardiovascular: Negative for chest pain. Respiratory: Negative for shortness of breath. Gastrointestinal: Negative for abdominal pain, vomiting and diarrhea. Neurological: Negative for headaches, focal weakness or numbness.  10-point ROS otherwise negative.  ____________________________________________   PHYSICAL EXAM:  VITAL SIGNS:   98.9 F (37.2 C)  94  20   149/85 mmHg  98 %     Constitutional: Alert and oriented. Well appearing and in no distress. Eyes: Conjunctivae are normal. PERRL. Normal extraocular movements. ENT   Head: Normocephalic and atraumatic.   Nose: No congestion/rhinnorhea.   Mouth/Throat: Mucous membranes are moist.   Neck: No stridor. Hematological/Lymphatic/Immunilogical: No cervical lymphadenopathy. Cardiovascular: Normal rate, regular rhythm.  No murmurs, rubs, or gallops. Respiratory: Normal respiratory effort  without tachypnea nor retractions. Breath sounds are clear and equal bilaterally. No wheezes/rales/rhonchi. Gastrointestinal: Soft. Non tender abdomen. Ostomy bag on umbilical wound without good seal, ascitic fluid draining.  Genitourinary: Deferred Musculoskeletal: Normal range of motion in all extremities. No joint effusions.  No lower extremity tenderness nor edema. Neurologic:  Normal speech and language. No gross focal neurologic deficits are appreciated.  Skin:  Skin is warm, dry and intact. No rash noted.  ____________________________________________    LABS (pertinent positives/negatives)  Labs Reviewed  BODY FLUID CELL COUNT WITH DIFFERENTIAL - Abnormal; Notable for the following:    Appearance, Fluid CLOUDY (*)    All other components within normal limits  BODY FLUID CULTURE  PATHOLOGIST SMEAR REVIEW     ____________________________________________   EKG  None  ____________________________________________    RADIOLOGY  None  ____________________________________________   PROCEDURES  Procedure(s) performed: None  Critical Care performed: No  ____________________________________________   INITIAL IMPRESSION / ASSESSMENT AND PLAN / ED COURSE  Pertinent labs & imaging results that were available during my care of the patient were reviewed by me and considered in my medical decision making (see chart for details).  Patient presented to the emergency department today because of concern for leakage around his ostomy bag that is containing ascitic fluid. There is fluid was slightly cloudy set was sent for cell count. Absolute neutrophil count was 54. Patient's abdomen is benign. This point I doubt this represents infection. Will discharge back to facility. The ostomy bag was replaced.  ____________________________________________   FINAL CLINICAL IMPRESSION(S) / ED DIAGNOSES  Final diagnoses:  Open wound of umbilical region, subsequent encounter  Ascites      Note: This dictation was prepared with Dragon dictation. Any transcriptional errors that result from this process are unintentional    Phineas SemenGraydon Nehemie Casserly, MD 04/24/16 1255

## 2016-04-24 NOTE — ED Notes (Signed)
Spoke to pt's sister, will come and pick pt up for discharge momentarily. Pt made aware and verbalized understanding.

## 2016-04-24 NOTE — ED Notes (Signed)
New colostomy bag placed on pt's hernia at this time. Pt educated on proper use and verbalized/demonsrated proper use at this time.

## 2016-04-24 NOTE — ED Notes (Signed)
Pt given phone at this time to speak to sister.

## 2016-04-24 NOTE — Discharge Instructions (Signed)
Please seek medical attention for any high fevers, chest pain, shortness of breath, change in behavior, persistent vomiting, bloody stool or any other new or concerning symptoms. ° ° °Ascites °Ascites is a collection of excess fluid in the abdomen. Ascites can range from mild to severe. It can get worse without treatment. °CAUSES °Possible causes include: °· Cirrhosis. This is the most common cause of ascites. °· Infection or inflammation in the abdomen. °· Cancer in the abdomen. °· Heart failure. °· Kidney disease. °· Inflammation of the pancreas. °· Clots in the veins of the liver. °SIGNS AND SYMPTOMS °Signs and symptoms may include: °· A feeling of fullness in your abdomen. This is common. °· An increase in the size of your abdomen or your waist. °· Swelling in your legs. °· Swelling of the scrotum in men. °· Difficulty breathing. °· Abdominal pain. °· Sudden weight gain. °If the condition is mild, you may not have symptoms. °DIAGNOSIS °To make a diagnosis, your health care provider will: °· Ask about your medical history. °· Perform a physical exam. °· Order imaging tests, such as an ultrasound or CT scan of your abdomen. °TREATMENT °Treatment depends on the cause of the ascites. It may include: °· Taking a pill to make you urinate. This is called a water pill (diuretic pill). °· Strictly reducing your salt (sodium) intake. Salt can cause extra fluid to be kept in the body, and this makes ascites worse. °· Having a procedure to remove fluid from your abdomen (paracentesis). °· Having a procedure to transfer fluid from your abdomen into a vein. °· Having a procedure that connects two of the major veins within your liver and relieves pressure on your liver (TIPS procedure). °Ascites may go away or improve with treatment of the condition that caused it.  °HOME CARE INSTRUCTIONS °· Keep track of your weight. To do this, weigh yourself at the same time every day and record your weight. °· Keep track of how much you  drink and any changes in the amount you urinate. °· Follow any instructions that your health care provider gives you about how much to drink. °· Try not to eat salty (high-sodium) foods. °· Take medicines only as directed by your health care provider. °· Keep all follow-up visits as directed by your health care provider. This is important. °· Report any changes in your health to your health care provider, especially if you develop new symptoms or your symptoms get worse. °SEEK MEDICAL CARE IF: °· Your gain more than 3 pounds in 3 days. °· Your abdominal size or your waist size increases. °· You have new swelling in your legs. °· The swelling in your legs gets worse. °SEEK IMMEDIATE MEDICAL CARE IF: °· You develop a fever. °· You develop confusion. °· You develop new or worsening difficulty breathing. °· You develop new or worsening abdominal pain. °· You develop new or worsening swelling in the scrotum (in men). °  °This information is not intended to replace advice given to you by your health care provider. Make sure you discuss any questions you have with your health care provider. °  °Document Released: 10/27/2005 Document Revised: 11/17/2014 Document Reviewed: 05/26/2014 °Elsevier Interactive Patient Education ©2016 [employer] Inc[employer] ° °

## 2016-04-24 NOTE — ED Notes (Signed)
Pt given urinal at this time, tolerating well, NAD noted. Urine clear.

## 2016-04-24 NOTE — ED Notes (Signed)
Pt to ed via ems with stomach fluid leaking through a stoma located in the umbilicus. Denies any other sx at this time.

## 2016-04-24 NOTE — ED Notes (Signed)
Pt sister arrived at this time for d/c, pt sister also educated on proper use of bag. Pt sister verbalized understanding at this time.

## 2016-04-25 LAB — PATHOLOGIST SMEAR REVIEW

## 2016-04-26 ENCOUNTER — Encounter: Payer: Self-pay | Admitting: *Deleted

## 2016-04-26 ENCOUNTER — Inpatient Hospital Stay
Admission: EM | Admit: 2016-04-26 | Discharge: 2016-05-01 | DRG: 640 | Disposition: A | Discharge status: Skilled Nursing Facility | Payer: Medicare Other | Attending: Internal Medicine | Admitting: Internal Medicine

## 2016-04-26 DIAGNOSIS — Z87891 Personal history of nicotine dependence: Secondary | ICD-10-CM

## 2016-04-26 DIAGNOSIS — Z888 Allergy status to other drugs, medicaments and biological substances status: Secondary | ICD-10-CM

## 2016-04-26 DIAGNOSIS — I10 Essential (primary) hypertension: Secondary | ICD-10-CM | POA: Diagnosis present

## 2016-04-26 DIAGNOSIS — Z880 Allergy status to penicillin: Secondary | ICD-10-CM | POA: Diagnosis not present

## 2016-04-26 DIAGNOSIS — R7881 Bacteremia: Secondary | ICD-10-CM | POA: Diagnosis present

## 2016-04-26 DIAGNOSIS — K421 Umbilical hernia with gangrene: Secondary | ICD-10-CM | POA: Diagnosis present

## 2016-04-26 DIAGNOSIS — Z66 Do not resuscitate: Secondary | ICD-10-CM | POA: Diagnosis present

## 2016-04-26 DIAGNOSIS — D684 Acquired coagulation factor deficiency: Secondary | ICD-10-CM | POA: Diagnosis present

## 2016-04-26 DIAGNOSIS — K650 Generalized (acute) peritonitis: Secondary | ICD-10-CM | POA: Diagnosis present

## 2016-04-26 DIAGNOSIS — R188 Other ascites: Secondary | ICD-10-CM | POA: Diagnosis not present

## 2016-04-26 DIAGNOSIS — Z79899 Other long term (current) drug therapy: Secondary | ICD-10-CM

## 2016-04-26 DIAGNOSIS — D638 Anemia in other chronic diseases classified elsewhere: Secondary | ICD-10-CM | POA: Diagnosis present

## 2016-04-26 DIAGNOSIS — E871 Hypo-osmolality and hyponatremia: Secondary | ICD-10-CM | POA: Diagnosis present

## 2016-04-26 DIAGNOSIS — R509 Fever, unspecified: Secondary | ICD-10-CM | POA: Diagnosis not present

## 2016-04-26 DIAGNOSIS — K7460 Unspecified cirrhosis of liver: Secondary | ICD-10-CM

## 2016-04-26 DIAGNOSIS — B9562 Methicillin resistant Staphylococcus aureus infection as the cause of diseases classified elsewhere: Secondary | ICD-10-CM | POA: Diagnosis present

## 2016-04-26 DIAGNOSIS — Z95828 Presence of other vascular implants and grafts: Secondary | ICD-10-CM

## 2016-04-26 DIAGNOSIS — A4902 Methicillin resistant Staphylococcus aureus infection, unspecified site: Secondary | ICD-10-CM | POA: Diagnosis not present

## 2016-04-26 DIAGNOSIS — K7031 Alcoholic cirrhosis of liver with ascites: Secondary | ICD-10-CM | POA: Diagnosis present

## 2016-04-26 DIAGNOSIS — K219 Gastro-esophageal reflux disease without esophagitis: Secondary | ICD-10-CM | POA: Diagnosis present

## 2016-04-26 DIAGNOSIS — G9341 Metabolic encephalopathy: Secondary | ICD-10-CM | POA: Diagnosis present

## 2016-04-26 DIAGNOSIS — E8809 Other disorders of plasma-protein metabolism, not elsewhere classified: Secondary | ICD-10-CM | POA: Diagnosis present

## 2016-04-26 DIAGNOSIS — Z8249 Family history of ischemic heart disease and other diseases of the circulatory system: Secondary | ICD-10-CM

## 2016-04-26 HISTORY — DX: Other ascites: R18.8

## 2016-04-26 HISTORY — DX: Umbilical hernia without obstruction or gangrene: K42.9

## 2016-04-26 HISTORY — DX: Do not resuscitate: Z66

## 2016-04-26 HISTORY — DX: Hypo-osmolality and hyponatremia: E87.1

## 2016-04-26 LAB — BASIC METABOLIC PANEL
Anion gap: 13 (ref 5–15)
BUN: 11 mg/dL (ref 6–20)
CHLORIDE: 71 mmol/L — AB (ref 101–111)
CO2: 23 mmol/L (ref 22–32)
Calcium: 8.1 mg/dL — ABNORMAL LOW (ref 8.9–10.3)
Creatinine, Ser: 1.07 mg/dL (ref 0.61–1.24)
GFR calc Af Amer: >60 mL/min (ref >60)
GFR calc non Af Amer: >60 mL/min (ref >60)
GLUCOSE: 112 mg/dL — AB (ref 65–99)
POTASSIUM: 4 mmol/L (ref 3.5–5.1)
Sodium: 107 mmol/L — CL (ref 135–145)

## 2016-04-26 LAB — CBC
HEMATOCRIT: 35.5 % — AB (ref 40.0–52.0)
HEMOGLOBIN: 12.4 g/dL — AB (ref 13.0–18.0)
MCH: 27 pg (ref 26.0–34.0)
MCHC: 35.1 g/dL (ref 32.0–36.0)
MCV: 77 fL — AB (ref 80.0–100.0)
Platelets: 297 10*3/uL (ref 150–440)
RBC: 4.61 MIL/uL (ref 4.40–5.90)
RDW: 17.1 % — AB (ref 11.5–14.5)
WBC: 15.9 10*3/uL — ABNORMAL HIGH (ref 3.8–10.6)

## 2016-04-26 LAB — HEPATIC FUNCTION PANEL
ALBUMIN: 3 g/dL — AB (ref 3.5–5.0)
ALK PHOS: 121 U/L (ref 38–126)
ALK PHOS: 115 U/L (ref 38–126)
ALT: 17 U/L (ref 17–63)
ALT: 16 U/L — ABNORMAL LOW (ref 17–63)
AST: 47 U/L — ABNORMAL HIGH (ref 15–41)
AST: 45 U/L — ABNORMAL HIGH (ref 15–41)
Albumin: 2.7 g/dL — ABNORMAL LOW (ref 3.5–5.0)
BILIRUBIN DIRECT: 0.5 mg/dL (ref 0.1–0.5)
BILIRUBIN TOTAL: 2.1 mg/dL — AB (ref 0.3–1.2)
BILIRUBIN TOTAL: 1.5 mg/dL — AB (ref 0.3–1.2)
Bilirubin, Direct: 0.6 mg/dL — ABNORMAL HIGH (ref 0.1–0.5)
Indirect Bilirubin: 1.5 mg/dL — ABNORMAL HIGH (ref 0.3–0.9)
Indirect Bilirubin: 1 mg/dL — ABNORMAL HIGH (ref 0.3–0.9)
Total Protein: 6.7 g/dL (ref 6.5–8.1)
Total Protein: 6.2 g/dL — ABNORMAL LOW (ref 6.5–8.1)

## 2016-04-26 LAB — URINALYSIS COMPLETE WITH MICROSCOPIC (ARMC ONLY)
BACTERIA UA: NONE SEEN
Bilirubin Urine: NEGATIVE
GLUCOSE, UA: NEGATIVE mg/dL
Ketones, ur: NEGATIVE mg/dL
Leukocytes, UA: NEGATIVE
Nitrite: NEGATIVE
PROTEIN: NEGATIVE mg/dL
SPECIFIC GRAVITY, URINE: 1.004 — AB (ref 1.005–1.030)
SQUAMOUS EPITHELIAL / LPF: NONE SEEN
pH: 6 (ref 5.0–8.0)

## 2016-04-26 LAB — SODIUM
SODIUM: 110 mmol/L — AB (ref 135–145)
Sodium: 110 mmol/L — CL (ref 135–145)
Sodium: 111 mmol/L — CL (ref 135–145)

## 2016-04-26 LAB — SODIUM, URINE, RANDOM

## 2016-04-26 LAB — PROTIME-INR
INR: 1.18
INR: 1.21
PROTHROMBIN TIME: 15.2 s — AB (ref 11.4–15.0)
PROTHROMBIN TIME: 15.5 s — AB (ref 11.4–15.0)

## 2016-04-26 LAB — LACTIC ACID, PLASMA
LACTIC ACID, VENOUS: 1 mmol/L (ref 0.5–2.0)
LACTIC ACID, VENOUS: 1 mmol/L (ref 0.5–2.0)

## 2016-04-26 LAB — CREATININE, URINE, RANDOM: CREATININE, URINE: 36 mg/dL

## 2016-04-26 LAB — ETHANOL: Alcohol, Ethyl (B): <5 mg/dL (ref <5)

## 2016-04-26 MED ORDER — DEXTROSE 5 % IV SOLN: 1.0000 g | INTRAVENOUS | Status: DC

## 2016-04-26 MED ORDER — ENOXAPARIN SODIUM 40 MG/0.4ML ~~LOC~~ SOLN
40.0000 mg | SUBCUTANEOUS | Status: DC
  Administered 2016-04-26: 40 mg via SUBCUTANEOUS
  Filled 2016-04-26: qty 0.4

## 2016-04-26 MED ORDER — POLYETHYLENE GLYCOL 3350 17 G PO PACK: 17.0000 g | PACK | Freq: Every day | ORAL | Status: DC | PRN

## 2016-04-26 MED ORDER — ZOLPIDEM TARTRATE 5 MG PO TABS
5.0000 mg | ORAL_TABLET | Freq: Every evening | ORAL | Status: DC | PRN
  Administered 2016-04-26 – 2016-04-27 (×2): 5 mg via ORAL
  Filled 2016-04-26 (×2): qty 1

## 2016-04-26 MED ORDER — SODIUM CHLORIDE 0.9% FLUSH
3.0000 mL | Freq: Two times a day (BID) | INTRAVENOUS | Status: DC
  Administered 2016-04-26 – 2016-04-27 (×2): 3 mL via INTRAVENOUS

## 2016-04-26 MED ORDER — DOCUSATE SODIUM 100 MG PO CAPS
100.0000 mg | ORAL_CAPSULE | Freq: Two times a day (BID) | ORAL | Status: DC
Start: 2016-04-26 — End: 2016-04-29
  Administered 2016-04-26 – 2016-04-29 (×6): 100 mg via ORAL
  Filled 2016-04-26 (×7): qty 1

## 2016-04-26 MED ORDER — DEXTROSE 5 % IV SOLN
2.0000 g | Freq: Two times a day (BID) | INTRAVENOUS | Status: DC
  Administered 2016-04-26 – 2016-04-28 (×4): 2 g via INTRAVENOUS
  Filled 2016-04-26 (×8): qty 2

## 2016-04-26 MED ORDER — SODIUM CHLORIDE 0.9% FLUSH
3.0000 mL | Freq: Two times a day (BID) | INTRAVENOUS | Status: DC
  Administered 2016-04-26 – 2016-04-27 (×3): 3 mL via INTRAVENOUS

## 2016-04-26 MED ORDER — ONDANSETRON HCL 4 MG PO TABS: 4.0000 mg | ORAL_TABLET | Freq: Four times a day (QID) | ORAL | Status: DC | PRN

## 2016-04-26 MED ORDER — SODIUM CHLORIDE 0.9 % IV SOLN
INTRAVENOUS | Status: DC
  Administered 2016-04-26 (×2): via INTRAVENOUS

## 2016-04-26 MED ORDER — SODIUM CHLORIDE 0.9 % IV SOLN: 250.0000 mL | INTRAVENOUS | Status: DC | PRN

## 2016-04-26 MED ORDER — ONDANSETRON HCL 4 MG/2ML IJ SOLN
4.0000 mg | Freq: Four times a day (QID) | INTRAMUSCULAR | Status: DC | PRN
  Filled 2016-04-26 (×2): qty 2

## 2016-04-26 MED ORDER — LORAZEPAM 0.5 MG PO TABS
0.5000 mg | ORAL_TABLET | Freq: Three times a day (TID) | ORAL | Status: DC
Start: 2016-04-26 — End: 2016-05-01
  Administered 2016-04-26 – 2016-05-01 (×14): 0.5 mg via ORAL
  Filled 2016-04-26 (×14): qty 1

## 2016-04-26 MED ORDER — VANCOMYCIN HCL IN DEXTROSE 1-5 GM/200ML-% IV SOLN
1000.0000 mg | Freq: Two times a day (BID) | INTRAVENOUS | Status: DC
  Administered 2016-04-27 – 2016-04-28 (×4): 1000 mg via INTRAVENOUS
  Filled 2016-04-26 (×6): qty 200

## 2016-04-26 MED ORDER — SODIUM CHLORIDE 0.9% FLUSH: 3.0000 mL | INTRAVENOUS | Status: DC | PRN

## 2016-04-26 MED ORDER — DEXTROSE 5 % IV SOLN
1.0000 g | Freq: Once | INTRAVENOUS | Status: AC
  Administered 2016-04-26: 1 g via INTRAVENOUS
  Filled 2016-04-26: qty 10

## 2016-04-26 MED ORDER — VANCOMYCIN HCL 10 G IV SOLR
1250.0000 mg | Freq: Once | INTRAVENOUS | Status: AC
  Administered 2016-04-26: 1250 mg via INTRAVENOUS
  Filled 2016-04-26: qty 1250

## 2016-04-26 MED ORDER — VANCOMYCIN HCL IN DEXTROSE 1-5 GM/200ML-% IV SOLN
1000.0000 mg | Freq: Two times a day (BID) | INTRAVENOUS | Status: DC
  Filled 2016-04-26 (×2): qty 200

## 2016-04-26 MED ORDER — ALBUTEROL SULFATE (2.5 MG/3ML) 0.083% IN NEBU: 2.5000 mg | INHALATION_SOLUTION | RESPIRATORY_TRACT | Status: DC | PRN

## 2016-04-26 MED ORDER — SODIUM CHLORIDE 0.9 % IV BOLUS (SEPSIS)
500.0000 mL | Freq: Once | INTRAVENOUS | Status: AC
  Administered 2016-04-26: 500 mL via INTRAVENOUS

## 2016-04-26 NOTE — Progress Notes (Signed)
Received pt from ED to Rm 213. Pt AOx4. VSS. No signs of acute distress. IV access x2 intact and saline locked. Medications administered (See MAR). Admission completed. Assessment completed. DVT prophylaxis assessed and completed.   Education provided on call bell, bed alarm, telephone and IV/IV Pole/IV Alarms. Education presented on use of [employer] as a Network engineerpatient/nurse communication tool. White Board was completed and updated PRN.   Dietary specification confirmed.   Pt resting comfortably and quietly w/bed in low and locked position and call bell and telephone within reach. Will continue to monitor.

## 2016-04-26 NOTE — Progress Notes (Addendum)
[facility] 110 [facility]

## 2016-04-26 NOTE — ED Notes (Signed)
Pt has a hernia through abdominal wall which is leaking fluid, pt seen 04/24/16 colostomy bag applied over hernia, pt reports he is not eating , pt complains of generalized weakness

## 2016-04-26 NOTE — Consult Note (Signed)
[facility] CONSULT NOTE    Date: 04/26/2016                  Patient Name:  Jeff BreslowDouglas W Preston  MRN: [id number]  DOB: [birth year]  Age / Sex: 70 y.o., male         PCP: Lynnae PrudeELLIOTT, [name], MD                 Service Requesting Consult: Dr. Sonia BallerSudini/Hospitalist                 Reason for Consult: Severe acute on chronic hyponatremia            History of Present Illness: Patient is a 70 y.o. 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 for evaluation of leaking umbilical hernia.  Apparently the patient has had an issue with this in the past and declined surgical intervention. He is also followed by Dr. Mechele CollinElliott closely for underlying cirrhosis of the liver with ascites.  On June 15 he had an abdominal paracentesis. This revealed group B strep growing.  The patient has chronic hyponatremia. Serum sodium is ranged between 125 to 130 over the past 6 weeks.  The patient has had rather poor by mouth intake over the past several weeks. He denies any recent alcohol ingestion and states that it has been greater than 1 months since he's had an alcoholic beverage. He denies vomiting or diarrhea but does endorse nausea. He is also noted to be on spironolactone and Lasix.   Medications: Outpatient medications: Prescriptions prior to admission  Medication Sig Dispense Refill Last Dose  . bisacodyl (DULCOLAX) 5 MG EC tablet Take 1 tablet (5 mg total) by mouth daily as needed for moderate constipation. 30 tablet 0 Past Month at Unknown time  . furosemide (LASIX) 20 MG tablet Take 20 mg by mouth 2 (two) times daily.    04/25/2016 at Unknown time  . ibuprofen (ADVIL,MOTRIN) 200 MG tablet Take 200-400 mg by mouth every 6 (six) hours as needed for moderate pain.   Past Month at Unknown time  . LORazepam (ATIVAN) 0.5 MG tablet Take 0.5  mg by mouth 3 (three) times daily.    04/26/2016 at Unknown time  . nadolol (CORGARD) 20 MG tablet Take 20 mg by mouth daily.   04/26/2016 at 0800  . nystatin (NYSTATIN) powder Apply topically 2 (two) times daily. 15 g 0 04/25/2016 at Unknown time  . ofloxacin (OCUFLOX) 0.3 % ophthalmic solution Place 1 drop into the left eye 4 (four) times daily. 5 mL 0 04/26/2016 at Unknown time  . omeprazole (PRILOSEC) 20 MG capsule Take 20 mg by mouth 2 (two) times daily.    04/26/2016 at Unknown time  . ondansetron (ZOFRAN-ODT) 4 MG disintegrating tablet Take 4 mg by mouth every 8 (eight) hours as needed for nausea or vomiting.   Past Month at Unknown time  . simethicone (MYLICON) 80 MG chewable tablet Chew 160-240 mg by mouth every 6 (six) hours as needed for flatulence.    Past Month at Unknown time  . spironolactone (ALDACTONE) 50 MG tablet Take 50 mg by mouth daily.   04/25/2016 at Unknown time  . zolpidem (AMBIEN) 10 MG tablet Take 10 mg by mouth at bedtime as needed for sleep.   Past Month at Unknown time  . acetaminophen (TYLENOL) 325 MG tablet  Take 2 tablets (650 mg total) by mouth every 6 (six) hours as needed for mild pain (or Fever >/= 101). (Patient not taking: Reported on 04/24/2016)   Not Taking at Unknown time    Current medications: Current Facility-Administered Medications  Medication Dose Route Frequency Provider Last Rate Last Dose  . 0.9 %  sodium chloride infusion  250 mL Intravenous PRN Srikar Sudini, MD      . 0.9 %  sodium chloride infusion   Intravenous Continuous Srikar Sudini, MD      . albuterol (PROVENTIL) (2.5 MG/3ML) 0.083% nebulizer solution 2.5 mg  2.5 mg Nebulization Q2H PRN Milagros Loll, MD      . Melene Muller ON 04/27/2016] cefTRIAXone (ROCEPHIN) 1 g in dextrose 5 % 50 mL IVPB  1 g Intravenous Q24H Srikar Sudini, MD      . docusate sodium (COLACE) capsule 100 mg  100 mg Oral BID Srikar Sudini, MD      . enoxaparin (LOVENOX) injection 40 mg  40 mg Subcutaneous Q24H Srikar Sudini, MD       . ondansetron (ZOFRAN) tablet 4 mg  4 mg Oral Q6H PRN Srikar Sudini, MD       Or  . ondansetron (ZOFRAN) injection 4 mg  4 mg Intravenous Q6H PRN Srikar Sudini, MD      . polyethylene glycol (MIRALAX / GLYCOLAX) packet 17 g  17 g Oral Daily PRN Srikar Sudini, MD      . sodium chloride flush (NS) 0.9 % injection 3 mL  3 mL Intravenous Q12H Srikar Sudini, MD      . sodium chloride flush (NS) 0.9 % injection 3 mL  3 mL Intravenous Q12H Srikar Sudini, MD      . sodium chloride flush (NS) 0.9 % injection 3 mL  3 mL Intravenous PRN Milagros Loll, MD          Allergies: Allergies  Allergen Reactions  . Dilantin [Phenytoin Sodium Extended] Rash  . Penicillins Rash and Other (See Comments)    Has patient had a PCN reaction causing immediate rash, facial/tongue/throat swelling, SOB or lightheadedness with hypotension: No Has patient had a PCN reaction causing severe rash involving mucus membranes or skin necrosis: No Has patient had a PCN reaction that required hospitalization No Has patient had a PCN reaction occurring within the last 10 years: No If all of the above answers are "NO", then may proceed with Cephalosporin use.      Past Medical History: Past Medical History  Diagnosis Date  . Hypertension   . Depression   . GERD (gastroesophageal reflux disease)   . Cirrhosis (HCC)   . Anxiety   . Umbilical hernia   . Ascites   . Chronic hyponatremia   . DNR (do not resuscitate)      Past Surgical History: Past Surgical History  Procedure Laterality Date  . Throat surgery    . Cholecystectomy       Family History: Family History  Problem Relation Age of Onset  . CAD Mother      Social History: Social History   Social History  . Marital Status: Single    Spouse Name: N/A  . Number of Children: N/A  . Years of Education: N/A   Occupational History  . Not on file.   Social History Main Topics  . Smoking status: Former Games developer  . Smokeless tobacco: Not on file  .  Alcohol Use: No  . Drug Use: No  . Sexual Activity: Not on file  Other Topics Concern  . Not on file   Social History Narrative     Review of Systems: Review of Systems  Constitutional: Positive for weight loss and malaise/fatigue. Negative for fever and chills.  HENT: Negative for ear pain, hearing loss and tinnitus.   Eyes: Negative for blurred vision and double vision.  Respiratory: Negative for cough, hemoptysis and sputum production.   Cardiovascular: Negative for chest pain, palpitations and orthopnea.  Gastrointestinal: Positive for nausea and abdominal pain. Negative for heartburn, vomiting and diarrhea.  Genitourinary: Negative for dysuria, urgency and frequency.  Skin: Negative for itching and rash.  Neurological: Negative for dizziness, focal weakness and headaches.  Endo/Heme/Allergies: Negative for polydipsia. Does not bruise/bleed easily.  Psychiatric/Behavioral: Positive for depression. The patient is nervous/anxious.      Vital Signs: Blood pressure 141/70, pulse 78, temperature 98.1 F (36.7 C), temperature source Oral, resp. rate 16, height 5\' 9"  (1.753 m), weight 90.719 kg (200 lb), SpO2 100 %.  Weight trends: Filed Weights   04/26/16 1208  Weight: 90.719 kg (200 lb)    Physical Exam: General: NAD, sitting up in bed  Head: Normocephalic, atraumatic.  Eyes: No icterus, cloudy left cornea  Nose: Mucous membranes moist, not inflammed, nonerythematous.  Throat: Oropharynx nonerythematous, no exudate appreciated.   Neck: Supple, trachea midline.  Lungs:  Normal respiratory effort. Clear to auscultation BL without crackles or wheezes.  Heart: RRR. S1 and S2 normal without gallop, murmur, or rubs.  Abdomen:  Mild diffuse tenderness, distended, leaking umbilical hernia.  Extremities: Trace LE edema  Neurologic: A&O X3, Motor strength is 5/5 in the all 4 extremities  Skin: No visible rashes, scars.    Lab results: Basic Metabolic Panel:  Recent  Labs Lab 04/26/16 1211  NA 107*  K 4.0  CL 71*  CO2 23  GLUCOSE 112*  BUN 11  CREATININE 1.07  CALCIUM 8.1*    Liver Function Tests: No results for input(s): AST, ALT, ALKPHOS, BILITOT, PROT, ALBUMIN in the last 168 hours. No results for input(s): LIPASE, AMYLASE in the last 168 hours. No results for input(s): AMMONIA in the last 168 hours.  CBC:  Recent Labs Lab 04/26/16 1211  WBC 15.9*  HGB 12.4*  HCT 35.5*  MCV 77.0*  PLT 297    Cardiac Enzymes: No results for input(s): CKTOTAL, CKMB, CKMBINDEX, TROPONINI in the last 168 hours.  BNP: Invalid input(s): POCBNP  CBG: No results for input(s): GLUCAP in the last 168 hours.  Microbiology: Results for orders placed or performed during the hospital encounter of 04/24/16  Body fluid culture     Status: None (Preliminary result)   Collection Time: 04/24/16 10:10 AM  Result Value Ref Range Status   Specimen Description FLUID PERITONEAL  Final   Special Requests NONE  Final   Gram Stain   Final    FEW WBC PRESENT,BOTH PMN AND MONONUCLEAR NO ORGANISMS SEEN    Culture   Final    FEW GROUP B STREP(S.AGALACTIAE)ISOLATED TESTING AGAINST S. AGALACTIAE NOT ROUTINELY PERFORMED DUE TO PREDICTABILITY OF AMP/PEN/VAN SUSCEPTIBILITY. CRITICAL RESULT CALLED TO, READ BACK BY AND VERIFIED WITH: J. COTRONE,RN AT 1318 ON [id number] BY Lucienne Capers Virtually 100% of S. agalactiae (Group B) strains are susceptible to Penicillin.  For Penicillin-allergic patients, Erythromycin (85-95% sensitive) and Clindamycin (80% sensitive) are drugs of choice. Contact microbiology lab to request sensitivities if  needed within 7 days. Performed at [facility]    Report Status PENDING  Incomplete    Coagulation Studies:  No results for input(s): LABPROT, INR in the last 72 hours.  Urinalysis:  Recent Labs  04/26/16 1417  COLORURINE YELLOW*  LABSPEC 1.004*  PHURINE 6.0  GLUCOSEU NEGATIVE  HGBUR 1+*  BILIRUBINUR NEGATIVE  KETONESUR  NEGATIVE  PROTEINUR NEGATIVE  NITRITE NEGATIVE  LEUKOCYTESUR NEGATIVE      Imaging:  No results found.   Assessment & Plan: Pt is a 70 y.o. 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    1.  Acute on chronic hyponatremia, now severe, admitting Na 107.  Baseline Na 125-130 2.  Acute peritonitis K65.0 3.  Cirrhosis of the liver.  Plan:  The patient presented with an interesting case. He has an umbilical hernia that is leaking ascites fluid. This is likely the cause of his acute peritonitis now. He also has chronic hyponatremia likely secondary to cirrhosis of the liver. However he now has acute worsening of hyponatremia with serum sodium low at 107. This is likely occurred over a relatively extended period of time. Therefore correction should be slow. It appears that he's had rather poor by mouth intake at home and was also on furosemide and spironolactone. Of note specific gravity of the urine was low at 1.004 indicating that his water excretion is likely intact. He likely has had very poor by mouth solute intake which history would support. Therefore agree with the administration of 0.9 normal saline at a low rate of 50 cc per hour. Also agree with frequent serum sodium checks. We would recommend a more conservative correction goal of 8 mEq within the next 24 hours. Therefore we recommend discontinuation of normal saline if serum sodium hits 115 within the next 24 hours. Treatment of peritonitis per hospitalist. Patient has been started on ceftriaxone. Thanks for consultation.

## 2016-04-26 NOTE — Consult Note (Signed)
Patient ID: Jeff Preston, male   DOB: 1946/05/07, 70 y.o.   MRN: [id number]  HPI Jeff Preston is a 70 y.o. male known to our service with a history of alcoholic cirrhosis month ago he was Jeff Preston with a normal INR. Apparently he chose to go home last time on by mouth antibiotics and I colostomy back. He is a poor historian apparently says that he is leaking ascites. At some point. He was seen by Jeff Preston [facility] GI at some point about 2 weeks ago and at that time he still had leaking cirrhosis. He had a paracentesis close to 9 L of drainage about 10 days ago. Outcomes with increasing leaking ascites and overall weakness. He denies any fevers and chills or any abdominal pain. Now there is some changes to the skin of the umbilical hernia.    HPI  Past Medical History  Diagnosis Date  . Hypertension   . Depression   . GERD (gastroesophageal reflux disease)   . Cirrhosis (HCC)   . Anxiety   . Umbilical hernia   . Ascites   . Chronic hyponatremia   . DNR (do not resuscitate)     Past Surgical History  Procedure Laterality Date  . Throat surgery    . Cholecystectomy      Family History  Problem Relation Age of Onset  . CAD Mother     Social History Social History  Substance Use Topics  . Smoking status: Former Games developer  . Smokeless tobacco: None  . Alcohol Use: No    Allergies  Allergen Reactions  . Dilantin [Phenytoin Sodium Extended] Rash  . Penicillins Rash and Other (See Comments)    Has patient had a PCN reaction causing immediate rash, facial/tongue/throat swelling, SOB or lightheadedness with hypotension: No Has patient had a PCN reaction causing severe rash involving mucus membranes or skin necrosis: No Has patient had a PCN reaction that required hospitalization No Has patient had a PCN reaction occurring within the last 10 years: No If all of the above answers are "NO", then may proceed with Cephalosporin use.    Current Facility-Administered Medications   Medication Dose Route Frequency Provider Last Rate Last Dose  . 0.9 %  sodium chloride infusion  250 mL Intravenous PRN Jeff Sudini, MD      . 0.9 %  sodium chloride infusion   Intravenous Continuous Jeff Sudini, MD      . albuterol (PROVENTIL) (2.5 MG/3ML) 0.083% nebulizer solution 2.5 mg  2.5 mg Nebulization Q2H PRN Jeff Sudini, MD      . ceFEPIme (MAXIPIME) 2 g in dextrose 5 % 50 mL IVPB  2 g Intravenous Q12H Jeff F Pabon, MD      . docusate sodium (COLACE) capsule 100 mg  100 mg Oral BID Jeff Sudini, MD      . enoxaparin (LOVENOX) injection 40 mg  40 mg Subcutaneous Q24H Jeff Sudini, MD      . ondansetron (ZOFRAN) tablet 4 mg  4 mg Oral Q6H PRN Jeff Sudini, MD       Or  . ondansetron (ZOFRAN) injection 4 mg  4 mg Intravenous Q6H PRN Jeff Sudini, MD      . polyethylene glycol (MIRALAX / GLYCOLAX) packet 17 g  17 g Oral Daily PRN Jeff Sudini, MD      . sodium chloride flush (NS) 0.9 % injection 3 mL  3 mL Intravenous Q12H Jeff Sudini, MD      . sodium chloride flush (NS)  0.9 % injection 3 mL  3 mL Intravenous Q12H Jeff Sudini, MD      . sodium chloride flush (NS) 0.9 % injection 3 mL  3 mL Intravenous PRN Jeff LollSrikar Sudini, MD         Review of Systems A 10 point review of systems was asked and was negative except for the information on the HPI  Physical Exam Blood pressure 141/70, pulse 78, temperature 98.1 F (36.7 C), temperature source Oral, resp. rate 16, height 5\' 9"  (1.753 m), weight 90.719 kg (200 lb), SpO2 100 %. CONSTITUTIONAL: debilitated chronically ill, non toxic or septic EYES: Pupils are equal, round, and reactive to light, Sclera are non-icteric. EARS, NOSE, MOUTH AND THROAT: The oropharynx is clear. The oral mucosa is pink and moist. Hearing is intact to voice. LYMPH NODES:  Lymph nodes in the neck are normal. RESPIRATORY:  Lungs are clear. There is normal respiratory effort, with equal breath sounds bilaterally, and without pathologic use of accessory  muscles. CARDIOVASCULAR: Heart is regular without murmurs, gallops, or rubs. GI: The abdomen is  soft, nontender, Is a reducible umbilical hernia with multiple small draining sinuses. There is ascites fluid is clear. There is a small area of skin necrosis and redness 2x2 cms. No peritonitis GU: Rectal deferred.   MUSCULOSKELETAL: Normal muscle strength and tone. No cyanosis or edema.   SKIN: Turgor is good and there are no pathologic skin lesions or ulcers. NEUROLOGIC: Motor and sensation is grossly normal. Cranial nerves are grossly intact. PSYCH:  Oriented to person, place and time. Affect is normal.  Data Reviewed  I have personally reviewed the patient's imaging, laboratory findings and medical records.    Assessment/ Plan 70 -year-old male with leaking umbilical hernia. No evidence of sepsis of overt peritoneal signs. He does have some streptococci growing from the peritoneal fluid. This is a difficult situation. For now we'll first establish his risk. We will order LFTs and INR to have a better idea where he sits right now. I last time he was child be and this could have worsened. I will start him on vancomycin and cefepime given the cultures of group Preston strep And that he is allergic to penicillin. No need for emergent surgical indication will have first to stratify his risk. Discussed with him with him at length about the options and now he's willing to entertain a surgical revision. I also discussed possibility of transferring to a tertiary center and right now he is refusing to do that at this time. We will also need to correct his sodium and and making an optimize him in the best possible shape preoperatively because of before attempting any repair. We will continue to follow . Extensive counseling provided.  Sterling Bigiego Pabon, MD FACS General Surgeon 04/26/2016, 6:00 PM

## 2016-04-26 NOTE — H&P (Signed)
[facility] [place] [facility]   PATIENT NAME: Jeff Preston    MR#:  [id number]  DATE OF BIRTH:  Sep 28, 1946  DATE OF ADMISSION:  04/26/2016  PRIMARY CARE PHYSICIAN: Lynnae Prude, MD   REQUESTING/REFERRING PHYSICIAN: Dr. Huel Cote  CHIEF COMPLAINT:   Chief Complaint  Patient presents with  . hernia leaking fluid   . Weakness    HISTORY OF PRESENT ILLNESS:  Jeff Preston  is a 70 y.o. male with a known history of Alcoholic cirrhosis, chronic ascites, umbilical hernia leaking ascetic fluid for which she refused surgery in the past presents to the emergency room brought in by family due to worsening weakness. Patient was seen in the emergency room due to leaking from his umbilical site in spite of putting a colostomy bag. Returns with the same problem. His fluid sent to the lab 2 days back is growing group B streptococcus. Sodium has been found to be 107. Patient has had poor intake. Decreased urine output. Has not had any alcohol or beer in one month.  PAST MEDICAL HISTORY:   Past Medical History  Diagnosis Date  . Hypertension   . Depression   . GERD (gastroesophageal reflux disease)   . Cirrhosis (HCC)   . Anxiety   . Umbilical hernia   . Ascites   . Chronic hyponatremia   . DNR (do not resuscitate)     PAST SURGICAL HISTORY:   Past Surgical History  Procedure Laterality Date  . Throat surgery    . Cholecystectomy      SOCIAL HISTORY:   Social History  Substance Use Topics  . Smoking status: Former Games developer  . Smokeless tobacco: Not on file  . Alcohol Use: No    FAMILY HISTORY:   Family History  Problem Relation Age of Onset  . CAD Mother     DRUG ALLERGIES:   Allergies  Allergen Reactions  . Dilantin [Phenytoin Sodium Extended] Rash  . Penicillins Rash and Other (See Comments)    Has patient had a PCN reaction causing immediate rash, facial/tongue/throat swelling, SOB or lightheadedness with hypotension: No Has  patient had a PCN reaction causing severe rash involving mucus membranes or skin necrosis: No Has patient had a PCN reaction that required hospitalization No Has patient had a PCN reaction occurring within the last 10 years: No If all of the above answers are "NO", then may proceed with Cephalosporin use.    REVIEW OF SYSTEMS:   Review of Systems  Constitutional: Positive for malaise/fatigue. Negative for fever and chills.  HENT: Negative for sore throat.   Eyes: Negative for blurred vision, double vision and pain.  Respiratory: Negative for cough, hemoptysis, shortness of breath and wheezing.   Cardiovascular: Negative for chest pain, palpitations, orthopnea and leg swelling.  Gastrointestinal: Negative for heartburn, nausea, vomiting, abdominal pain, diarrhea and constipation.  Genitourinary: Negative for dysuria and hematuria.  Musculoskeletal: Positive for back pain. Negative for joint pain.  Skin: Negative for rash.  Neurological: Positive for weakness. Negative for sensory change, speech change, focal weakness and headaches.  Endo/Heme/Allergies: Does not bruise/bleed easily.  Psychiatric/Behavioral: Negative for depression. The patient is not nervous/anxious.     MEDICATIONS AT HOME:   Prior to Admission medications   Medication Sig Start Date End Date Taking? Authorizing Provider  acetaminophen (TYLENOL) 325 MG tablet Take 2 tablets (650 mg total) by mouth every 6 (six) hours as needed for mild pain (or Fever >/= 101). Patient not taking: Reported on 04/24/2016 03/16/16  Ramonita Lab, MD  bisacodyl (DULCOLAX) 5 MG EC tablet Take 1 tablet (5 mg total) by mouth daily as needed for moderate constipation. 03/16/16   Ramonita Lab, MD  furosemide (LASIX) 20 MG tablet Take 20 mg by mouth 2 (two) times daily.     Historical Provider, MD  ibuprofen (ADVIL,MOTRIN) 200 MG tablet Take 200-400 mg by mouth every 6 (six) hours as needed for moderate pain.    Historical Provider, MD  LORazepam  (ATIVAN) 0.5 MG tablet Take 0.5 mg by mouth 3 (three) times daily.     Historical Provider, MD  nadolol (CORGARD) 20 MG tablet Take 20 mg by mouth daily.    Historical Provider, MD  nystatin (NYSTATIN) powder Apply topically 2 (two) times daily. 03/16/16   Ramonita Lab, MD  ofloxacin (OCUFLOX) 0.3 % ophthalmic solution Place 1 drop into the left eye 4 (four) times daily. 03/16/16   Ramonita Lab, MD  omeprazole (PRILOSEC) 20 MG capsule Take 20 mg by mouth 2 (two) times daily.     Historical Provider, MD  ondansetron (ZOFRAN-ODT) 4 MG disintegrating tablet Take 4 mg by mouth every 8 (eight) hours as needed for nausea or vomiting.    Historical Provider, MD  simethicone (MYLICON) 80 MG chewable tablet Chew 160-240 mg by mouth every 6 (six) hours as needed for flatulence.     Historical Provider, MD  spironolactone (ALDACTONE) 50 MG tablet Take 50 mg by mouth daily.    Historical Provider, MD  zolpidem (AMBIEN) 10 MG tablet Take 10 mg by mouth at bedtime as needed for sleep.    Historical Provider, MD     VITAL SIGNS:  Blood pressure 151/89, pulse 71, temperature 97.8 F (36.6 C), temperature source Oral, resp. rate 19, height 5\' 9"  (1.753 m), weight 90.719 kg (200 lb), SpO2 94 %.  PHYSICAL EXAMINATION:  Physical Exam  GENERAL:  70 y.o.-year-old patient lying in the bed with no acute distress.  EYES: Pupils equal, round, reactive to light and accommodation. No scleral icterus. Extraocular muscles intact.  HEENT: Head atraumatic, normocephalic. Oropharynx and nasopharynx clear. No oropharyngeal erythema, moist oral mucosa  NECK:  Supple, no jugular venous distention. No thyroid enlargement, no tenderness.  LUNGS: Normal breath sounds bilaterally, no wheezing, rales, rhonchi. No use of accessory muscles of respiration.  CARDIOVASCULAR: S1, S2 normal. No murmurs, rubs, or gallops.  ABDOMEN: Soft, nontender, nondistended. Bowel sounds present. No organomegaly or mass.  Small umbilical hernia which is  leaking ascetic fluid. EXTREMITIES: No pedal edema, cyanosis, or clubbing. + 2 pedal & radial pulses b/l.   NEUROLOGIC: Cranial nerves II through XII are intact. No focal Motor or sensory deficits appreciated b/l PSYCHIATRIC: The patient is alert and oriented x 3. Flat affect. Slow to answer questions. SKIN: No obvious rash, lesion, or ulcer.   LABORATORY PANEL:   CBC  Recent Labs Lab 04/26/16 1211  WBC 15.9*  HGB 12.4*  HCT 35.5*  PLT 297   ------------------------------------------------------------------------------------------------------------------  Chemistries   Recent Labs Lab 04/26/16 1211  NA 107*  K 4.0  CL 71*  CO2 23  GLUCOSE 112*  BUN 11  CREATININE 1.07  CALCIUM 8.1*   ------------------------------------------------------------------------------------------------------------------  Cardiac Enzymes No results for input(s): TROPONINI in the last 168 hours. ------------------------------------------------------------------------------------------------------------------  RADIOLOGY:  No results found.   IMPRESSION AND PLAN:   * Chronic hyponatremia Likely due to dehydration and poor oral intake. Patient has received a normal saline bolus in the emergency room. We'll check a stat sodium level. Continue  this every 2 hours. Start normal saline at 50 ML per hour. Discussed with nephrology Dr. Sheilah MinsMansoor. Consult placed. Will need to target about 10-12 mEq rise in the first 24 hours. Check urine sodium and creatinine.  * Group B streptococcus SBP Start ceftriaxone. Patient has penicillin allergy.  * Cirrhosis with chronic ascites Patient has ascites fluid leak around his umbilicus hernia. No significant ascites on examination.  *Umbilical hernia Patient has refused surgery in the past. He is reconsidering surgery and wants to discuss his options. We'll reconsult surgical team.  * DVT prophylaxis with Lovenox.  Patient is critically ill with severe  hyponatremia and needs close monitoring.  All the records are reviewed and case discussed with ED provider. Management plans discussed with the patient, family and they are in agreement.  CODE STATUS: DNR  TOTAL CC TIME TAKING CARE OF THIS PATIENT: 40 minutes.   Milagros LollSudini, Jeff Preston R M.D on 04/26/2016 at 3:46 PM  Between 7am to 6pm - Pager - [phone]  After 6pm go to [email or web address] - password [email or web address] [facility]  [facility]  Office  [phone]  CC: Primary care physician; Lynnae PrudeELLIOTT, [name], MD  Note: This dictation was prepared with Dragon dictation along with smaller phrase technology. Any transcriptional errors that result from this process are unintentional.

## 2016-04-26 NOTE — ED Provider Notes (Addendum)
Time Seen: Approximately 1440 I have reviewed the triage notes  Chief Complaint: hernia leaking fluid  and Weakness   History of Present Illness: Jeff Preston is a 70 y.o. male who presents with a previous history of an abdominal hernia with drainage from ascites. 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 The patient was here 2 days ago and had drainage of the umbilical site of his fluid which he states he's had a colostomy bags there previously. We placed a colostomy bag. Patient's had recent peritoneal fluid drainage which apparently we've been called and has a positive culture of strep B.  Patient denies any fever. He states he does feel somewhat slow and sluggish. Patient was brought here for evaluation by family. He denies any focal weakness.   Past Medical History  Diagnosis Date  . Hypertension   . Depression   . GERD (gastroesophageal reflux disease)   . Cirrhosis (HCC)   . Anxiety     Patient Active Problem List   Diagnosis Date Noted  . Open wound of umbilical region   . Ascites 03/12/2016    Past Surgical History  Procedure Laterality Date  . Throat surgery    . Cholecystectomy      Past Surgical History  Procedure Laterality Date  . Throat surgery    . Cholecystectomy      Current Outpatient Rx  Name  Route  Sig  Dispense  Refill  . acetaminophen (TYLENOL) 325 MG tablet   Oral   Take 2 tablets (650 mg total) by mouth every 6 (six) hours as needed for mild pain (or Fever >/= 101). Patient not taking: Reported on 04/24/2016         . bisacodyl (DULCOLAX) 5 MG EC tablet   Oral   Take 1 tablet (5 mg total) by mouth daily as needed for moderate constipation.   30 tablet   0   . furosemide (LASIX) 20 MG tablet   Oral   Take 20 mg by mouth 2 (two) times daily.          Marland Kitchen ibuprofen (ADVIL,MOTRIN) 200 MG tablet   Oral   Take 200-400 mg by mouth every 6 (six) hours as needed for moderate pain.          Marland Kitchen LORazepam (ATIVAN) 0.5 MG tablet   Oral   Take 0.5 mg by mouth 3 (three) times daily.          . nadolol (CORGARD) 20 MG tablet   Oral   Take 20 mg by mouth daily.         Marland Kitchen nystatin (NYSTATIN) powder   Topical   Apply topically 2 (two) times daily.   15 g   0   . ofloxacin (OCUFLOX) 0.3 % ophthalmic solution   Left Eye   Place 1 drop into the left eye 4 (four) times daily.   5 mL   0   . omeprazole (PRILOSEC) 20 MG capsule   Oral   Take 20 mg by mouth 2 (two) times daily.          . ondansetron (ZOFRAN-ODT) 4 MG disintegrating tablet   Oral   Take 4 mg by mouth every 8 (eight) hours as needed for nausea or vomiting.         . simethicone (MYLICON) 80 MG chewable tablet   Oral   Chew 160-240 mg by mouth every 6 (six) hours as needed for flatulence.          Marland Kitchen  spironolactone (ALDACTONE) 50 MG tablet   Oral   Take 50 mg by mouth daily.         Marland Kitchen zolpidem (AMBIEN) 10 MG tablet   Oral   Take 10 mg by mouth at bedtime as needed for sleep.           Allergies:  Dilantin and Penicillins  Family History: Family History  Problem Relation Age of Onset  . CAD Mother     Social History: Social History  Substance Use Topics  . Smoking status: Former Games developer  . Smokeless tobacco: None  . Alcohol Use: No     Review of Systems:   10 point review of systems was performed and was otherwise negative:  Constitutional: No fever Eyes: No visual disturbances ENT: No sore throat, ear pain Cardiac: No chest pain Respiratory: No shortness of breath, wheezing, or stridor Abdomen: No abdominal pain, no vomiting, No diarrhea Endocrine: No weight loss, No night sweats Extremities: No peripheral edema, cyanosis Skin: No rashes, easy bruising Neurologic: No focal weaknessHe describes slowness in his speech  Urologic: No dysuria, Hematuria, or urinary frequency   Physical Exam:  ED Triage Vitals  Enc Vitals Group     BP 04/26/16 1208 119/70 mmHg      Pulse Rate 04/26/16 1208 83     Resp 04/26/16 1208 18     Temp 04/26/16 1208 97.8 F (36.6 C)     Temp Source 04/26/16 1208 Oral     SpO2 04/26/16 1208 99 %     Weight 04/26/16 1208 200 lb (90.719 kg)     Height 04/26/16 1208  (1.753 m)     Head Cir --      Peak Flow --      Pain Score --      Pain Loc --      Pain Edu? --      Excl. in GC? --     General: Awake , Alert , and Oriented times 3; GCS 15Occasionally slow to respond to questions with  accurate answers Head: Normal cephalic , atraumatic Eyes: Pupils equal , round, reactive to light Nose/Throat: No nasal drainage, patent upper airway without erythema or exudate.  Neck: Supple, Full range of motion, No anterior adenopathy or palpable thyroid masses Lungs: Clear to ascultation without wheezes , rhonchi, or rales Heart: Regular rate, regular rhythm without murmurs , gallops , or rubs Abdomen: Umbilical hernia with erythema with no signs of significant cellulitis. No active drainage at this time.     No rebound guarding or rigidity. Extremities: Bilateral circumferential edema in both lower extremities Neurologic: normal ambulation, Motor symmetric without deficits, sensory intact Skin: warm, dry, no rashes   Labs:   All laboratory work was reviewed including any pertinent negatives or positives listed below:  Labs Reviewed  BASIC METABOLIC PANEL - Abnormal; Notable for the following:    Sodium 107 (*)    Chloride 71 (*)    Glucose, Bld 112 (*)    Calcium 8.1 (*)    All other components within normal limits  CBC - Abnormal; Notable for the following:    WBC 15.9 (*)    Hemoglobin 12.4 (*)    HCT 35.5 (*)    MCV 77.0 (*)    RDW 17.1 (*)    All other components within normal limits  URINALYSIS COMPLETEWITH MICROSCOPIC ([facility] ONLY) - Abnormal; Notable for the following:    Color, Urine YELLOW (*)    APPearance CLEAR (*)  Specific Gravity, Urine 1.004 (*)    Hgb urine dipstick 1+ (*)    All other components  within normal limits  CULTURE, BLOOD (ROUTINE X 2)  CULTURE, BLOOD (ROUTINE X 2)  LACTIC ACID, PLASMA  LACTIC ACID, PLASMA    EKG ED ECG REPORT I, Jennye MoccasinBrian S Daneshia Tavano, the attending physician, personally viewed and interpreted this ECG.  Date: 04/26/2016 EKG Time: 1218 Rate: 81 Rhythm: normal sinus rhythm QRS Axis: normal Intervals: normal ST/T Wave abnormalities: normal Conduction Disturbances: none Narrative Interpretation: unremarkable Poor R-wave progression noticed in the anterior leads with no acute ischemic changes  ED Course:  Patient was evaluated for sepsis and has lactic acid level pending at this time. His white count is elevated at 15.9. Of clinical significance of this sodium of 107. She was started on normal saline bolus and was also established on Rocephin for his positive culture. I spoke to the hospitalist team is agreed to see and evaluate the patient, further disposition and management depends upon their evaluation. His attorney was cleaned and Moist with some gauze dressing.   Assessment Hyponatremia History of cirrhosis with ascites Umbilical hernia DO NOT RESUSCITATE  Final Clinical Impression: *  Final diagnoses:  Hyponatremia     Plan:  Inpatient management            Jennye MoccasinBrian S Lanore Renderos, MD 04/26/16 1500  Jennye MoccasinBrian S Jaden Batchelder, MD 04/26/16 1524

## 2016-04-26 NOTE — Consult Note (Signed)
Pharmacy Antibiotic Note  Jeff Preston is a 70 y.o. male admitted on 04/26/2016 with strep from ascitis secondary peritonitis Pharmacy has been consulted for vancomycin dosing.  Plan: Vancomycin 1250mg  once then in 6 hours start vancomycin 1g q 12 for stacked dosing Vancomycin 1000 IV every 12 hours.  Goal trough 15-20 mcg/mL.  Will check trough prior to 5th dose 6/19 @ 1200  Height: 5\' 9"  (175.3 cm) Weight: 200 lb (90.719 kg) IBW/kg (Calculated) : 70.7  Temp (24hrs), Avg:98 F (36.7 C), Min:97.8 F (36.6 C), Max:98.1 F (36.7 C)   Recent Labs Lab 04/26/16 1211 04/26/16 1417 04/26/16 1745  WBC 15.9*  --   --   CREATININE 1.07  --   --   LATICACIDVEN  --  1.0 1.0    Estimated Creatinine Clearance: 72.5 mL/min (by C-G formula based on Cr of 1.07).    Allergies  Allergen Reactions  . Dilantin [Phenytoin Sodium Extended] Rash  . Penicillins Rash and Other (See Comments)    Has patient had a PCN reaction causing immediate rash, facial/tongue/throat swelling, SOB or lightheadedness with hypotension: No Has patient had a PCN reaction causing severe rash involving mucus membranes or skin necrosis: No Has patient had a PCN reaction that required hospitalization No Has patient had a PCN reaction occurring within the last 10 years: No If all of the above answers are "NO", then may proceed with Cephalosporin use.    Antimicrobials this admission: vancomycin 6/17 >>  cefepime 6/17 >>   Dose adjustments this admission:   Microbiology results: 6/17 BCx: pending 6/15 fluid peritoneal few group b strep  Thank you for allowing pharmacy to be a part of this patient's care.  Olene FlossMelissa D Stephaniemarie Stoffel, Pharm.D Clinical Pharmacist   04/26/2016 6:43 PM

## 2016-04-26 NOTE — ED Notes (Signed)
Call from Lab peritoneal fluid positive for group B strep, Dr Huel CoteQuigley notified

## 2016-04-26 NOTE — Progress Notes (Signed)
RN notified by lab of pt. Having a critical lab, Na 110. Doctor Cherlynn KaiserSainani was notified. No new orders given at this time. Will continue to monitor pt.   Jeff Preston

## 2016-04-26 NOTE — Progress Notes (Signed)
Lab notified RN of critical lab. Pt.'s Na is 111, Doctor Cherlynn KaiserSainani was notified of findings. New orders to check sodium every three hours. Will continue to monitor pt.  Karsten RoLauren E Hobbs

## 2016-04-27 DIAGNOSIS — R188 Other ascites: Secondary | ICD-10-CM

## 2016-04-27 LAB — BLOOD CULTURE ID PANEL (REFLEXED)
Acinetobacter baumannii: NOT DETECTED
CANDIDA ALBICANS: NOT DETECTED
CANDIDA GLABRATA: NOT DETECTED
CANDIDA TROPICALIS: NOT DETECTED
Candida krusei: NOT DETECTED
Candida parapsilosis: NOT DETECTED
Carbapenem resistance: NOT DETECTED
ENTEROBACTER CLOACAE COMPLEX: NOT DETECTED
ENTEROBACTERIACEAE SPECIES: NOT DETECTED
ESCHERICHIA COLI: NOT DETECTED
Enterococcus species: NOT DETECTED
HAEMOPHILUS INFLUENZAE: NOT DETECTED
Klebsiella oxytoca: NOT DETECTED
Klebsiella pneumoniae: NOT DETECTED
Listeria monocytogenes: NOT DETECTED
METHICILLIN RESISTANCE: DETECTED — AB
NEISSERIA MENINGITIDIS: NOT DETECTED
PROTEUS SPECIES: NOT DETECTED
PSEUDOMONAS AERUGINOSA: NOT DETECTED
STAPHYLOCOCCUS AUREUS BCID: DETECTED — AB
STREPTOCOCCUS AGALACTIAE: NOT DETECTED
STREPTOCOCCUS PNEUMONIAE: NOT DETECTED
STREPTOCOCCUS PYOGENES: NOT DETECTED
Serratia marcescens: NOT DETECTED
Staphylococcus species: DETECTED — AB
Streptococcus species: NOT DETECTED
Vancomycin resistance: NOT DETECTED

## 2016-04-27 LAB — SODIUM
SODIUM: 118 mmol/L — AB (ref 135–145)
SODIUM: 119 mmol/L — AB (ref 135–145)
SODIUM: 120 mmol/L — AB (ref 135–145)
Sodium: 118 mmol/L — CL (ref 135–145)
Sodium: 119 mmol/L — CL (ref 135–145)
Sodium: 120 mmol/L — ABNORMAL LOW (ref 135–145)

## 2016-04-27 LAB — COMPREHENSIVE METABOLIC PANEL
ALT: 15 U/L — ABNORMAL LOW (ref 17–63)
ANION GAP: 9 (ref 5–15)
AST: 45 U/L — ABNORMAL HIGH (ref 15–41)
Albumin: 2.7 g/dL — ABNORMAL LOW (ref 3.5–5.0)
Alkaline Phosphatase: 104 U/L (ref 38–126)
BILIRUBIN TOTAL: 1.4 mg/dL — AB (ref 0.3–1.2)
BUN: 12 mg/dL (ref 6–20)
CALCIUM: 7.7 mg/dL — AB (ref 8.9–10.3)
CO2: 23 mmol/L (ref 22–32)
Chloride: 83 mmol/L — ABNORMAL LOW (ref 101–111)
Creatinine, Ser: 1.02 mg/dL (ref 0.61–1.24)
GFR calc Af Amer: >60 mL/min (ref >60)
Glucose, Bld: 93 mg/dL (ref 65–99)
POTASSIUM: 3.9 mmol/L (ref 3.5–5.1)
Sodium: 115 mmol/L — CL (ref 135–145)
TOTAL PROTEIN: 6 g/dL — AB (ref 6.5–8.1)

## 2016-04-27 MED ORDER — HYDROCOD POLST-CPM POLST ER 10-8 MG/5ML PO SUER
5.0000 mL | Freq: Two times a day (BID) | ORAL | Status: DC | PRN
  Administered 2016-04-27 – 2016-04-29 (×3): 5 mL via ORAL
  Filled 2016-04-27 (×4): qty 5

## 2016-04-27 MED ORDER — SODIUM CHLORIDE 0.9 % IV SOLN: INTRAVENOUS | Status: DC

## 2016-04-27 MED ORDER — SODIUM CHLORIDE 0.45 % IV SOLN
INTRAVENOUS | Status: DC
  Administered 2016-04-27 – 2016-04-29 (×3): via INTRAVENOUS

## 2016-04-27 NOTE — Progress Notes (Signed)
RN was notified by lab of pt.'s critical lab. Pt.'s Na is 115. Prime doctor was notified, no new orders given at this time. Will continue to monitor pt.

## 2016-04-27 NOTE — Progress Notes (Signed)
04/27/2016 11:30  Emptied colostomy bag that is collecting drainage from umbilical  Hernia.  It was yellow in color, 350cc, and had sediment  And cloudiness.  Bradly Chrisougherty, Anvika Gashi E, RN

## 2016-04-27 NOTE — Progress Notes (Signed)
RN placed an ostomy bag around pt.'s protruding hernia to prevent skin breakdown due to the excessive amount of drainage from the hernia. The total amount of drainage in the last 12 hours was 75ml, with being clear in appearance. Will continue to monitor pt.   Jeff Preston

## 2016-04-27 NOTE — Consult Note (Signed)
Patient well known to me with alcoholic cirrhosis of liver, previously diuretic resistant which required large volume paracentesis every 3-4 weeks.  He had an extremely enlarged scrotum during that time.  He developed a spontaneous perforation at umbilicus and has been draining into a bag since then.  His enlarged scrotum resolved.  He has developed infection Strep B of the ascites fluid and severe hyponatremia with sodium down to 107. His prognosis is not good.    PE: pt knows me, knows his room number, no tremor, chest clear,abd with drainage bag on.   Albumin 2.7, sodium up a little to 115.    A: cirrhosis with diuretic resistant, now with drainage from spontaneous rupture in umbilicus area with Strep B infection and severe hyponatremia.  I would continue giving him NS and not use 3% saline at this time.  If we get him above 120 that would be helpful.  i will follow with you.

## 2016-04-27 NOTE — Consult Note (Signed)
Pharmacy Antibiotic Note  Jeff Preston is a 70 y.o. male admitted on 04/26/2016 with strep from ascitis secondary peritonitis Pharmacy has been consulted for vancomycin dosing.  Plan: Vancomycin 1250mg  once then in 6 hours start vancomycin 1g q 12 for stacked dosing Vancomycin 1000 IV every 12 hours.  Goal trough 15-20 mcg/mL.  Will check trough prior to 5th dose 6/19 @ 1200  Height: 5\' 9"  (175.3 cm) Weight: 180 lb 4.8 oz (81.784 kg) IBW/kg (Calculated) : 70.7  Temp (24hrs), Avg:97.9 F (36.6 C), Min:97.5 F (36.4 C), Max:98.1 F (36.7 C)   Recent Labs Lab 04/26/16 1211 04/26/16 1417 04/26/16 1745 04/27/16 0512  WBC 15.9*  --   --   --   CREATININE 1.07  --   --  1.02  LATICACIDVEN  --  1.0 1.0  --     Estimated Creatinine Clearance: 68.4 mL/min (by C-G formula based on Cr of 1.02).    Allergies  Allergen Reactions  . Dilantin [Phenytoin Sodium Extended] Rash  . Penicillins Rash and Other (See Comments)    Has patient had a PCN reaction causing immediate rash, facial/tongue/throat swelling, SOB or lightheadedness with hypotension: No Has patient had a PCN reaction causing severe rash involving mucus membranes or skin necrosis: No Has patient had a PCN reaction that required hospitalization No Has patient had a PCN reaction occurring within the last 10 years: No If all of the above answers are "NO", then may proceed with Cephalosporin use.    Antimicrobials this admission: vancomycin 6/17 >>  cefepime 6/17 >>   Dose adjustments this admission:   Microbiology results: 6/17 BCx: pending 6/15 fluid peritoneal few group b strep  6/18 06:45 BCID returned MRSA from blood cultures. Patient already on vancomycin. No new recommendations.  Thank you for allowing pharmacy to be a part of this patient's care.  Olene FlossMelissa D Maccia, Pharm.D Clinical Pharmacist   04/27/2016 6:57 AM

## 2016-04-27 NOTE — Progress Notes (Signed)
[facility]  ROUNDING NOTE   Subjective:  Patient seen at bedside. Serum sodium up to 118 which is near the upper limit of correction. Therefore he has been started temporarily on half-normal saline to stabilize his sodium. It appears that there are no immediate plans for surgical intervention as he is high operative risk.   Objective:  Vital signs in last 24 hours:  Temp:  [97.5 F (36.4 C)-98.1 F (36.7 C)] 97.5 F (36.4 C) (06/18 0529) Pulse Rate:  [65-91] 91 (06/18 0529) Resp:  [11-20] 20 (06/18 0529) BP: (128-163)/(61-89) 134/61 mmHg (06/18 0529) SpO2:  [94 %-100 %] 98 % (06/18 0529) Weight:  [81.784 kg (180 lb 4.8 oz)] 81.784 kg (180 lb 4.8 oz) (06/18 0500)  Weight change:  Filed Weights   04/26/16 1208 04/27/16 0500  Weight: 90.719 kg (200 lb) 81.784 kg (180 lb 4.8 oz)    Intake/Output: I/O last 3 completed shifts: In: 723 [I.V.:723] Out: 1600 [Urine:1600]   Intake/Output this shift:  Total I/O In: 440 [P.O.:240; I.V.:200] Out: 1125 [Urine:1125]  Physical Exam: General: NAD, resting in bed  Head: Normocephalic, atraumatic. Moist oral mucosal membranes  Eyes: Anicteric  Neck: Supple, trachea midline  Lungs:  Clear to auscultation, normal effort  Heart: S1S2 no rubs  Abdomen:  Mild distension, leaking umbilical hernia   Extremities: trace peripheral edema.  Neurologic: Slow to answer questions, but awake and alert  Skin: No lesions       Basic Metabolic Panel:  Recent Labs Lab 04/26/16 1211  04/26/16 1944 04/26/16 2121 04/27/16 0512 04/27/16 0825 04/27/16 1135  NA 107*  < > 110* 111* 115* 118* 118*  K 4.0  --   --   --  3.9  --   --   CL 71*  --   --   --  83*  --   --   CO2 23  --   --   --  23  --   --   GLUCOSE 112*  --   --   --  93  --   --   BUN 11  --   --   --  12  --   --   CREATININE 1.07  --   --   --  1.02  --   --   CALCIUM 8.1*  --   --   --  7.7*  --   --   < > = values in this interval not displayed.  Liver  Function Tests:  Recent Labs Lab 04/26/16 1211 04/26/16 1745 04/27/16 0512  AST 47* 45* 45*  ALT 17 16* 15*  ALKPHOS 121 115 104  BILITOT 2.1* 1.5* 1.4*  PROT 6.7 6.2* 6.0*  ALBUMIN 3.0* 2.7* 2.7*   No results for input(s): LIPASE, AMYLASE in the last 168 hours. No results for input(s): AMMONIA in the last 168 hours.  CBC:  Recent Labs Lab 04/26/16 1211  WBC 15.9*  HGB 12.4*  HCT 35.5*  MCV 77.0*  PLT 297    Cardiac Enzymes: No results for input(s): CKTOTAL, CKMB, CKMBINDEX, TROPONINI in the last 168 hours.  BNP: Invalid input(s): POCBNP  CBG: No results for input(s): GLUCAP in the last 168 hours.  Microbiology: Results for orders placed or performed during the hospital encounter of 04/26/16  Culture, blood (Routine X 2) w Reflex to ID Panel     Status: None (Preliminary result)   Collection Time: 04/26/16  2:19 PM  Result Value Ref Range Status  Specimen Description BLOOD LEFT HAND  Final   Special Requests BOTTLES DRAWN AEROBIC AND ANAEROBIC  1CC  Final   Culture NO GROWTH < 24 HOURS  Final   Report Status PENDING  Incomplete  Culture, blood (Routine X 2) w Reflex to ID Panel     Status: None (Preliminary result)   Collection Time: 04/26/16  2:19 PM  Result Value Ref Range Status   Specimen Description BLOOD RIGHT HAND  Final   Special Requests BOTTLES DRAWN AEROBIC AND ANAEROBIC  5CC  Final   Culture  Setup Time   Final    GRAM POSITIVE COCCI AEROBIC BOTTLE ONLY CRITICAL RESULT CALLED TO, READ BACK BY AND VERIFIED WITH: MATT MCBANE @ [phone] 04/27/16 BY [facility]    Culture NO GROWTH < 24 HOURS  Final   Report Status PENDING  Incomplete  Blood Culture ID Panel (Reflexed)     Status: Abnormal   Collection Time: 04/26/16  2:19 PM  Result Value Ref Range Status   Enterococcus species NOT DETECTED NOT DETECTED Final   Vancomycin resistance NOT DETECTED NOT DETECTED Final   Listeria monocytogenes NOT DETECTED NOT DETECTED Final   Staphylococcus species DETECTED  (A) NOT DETECTED Final    Comment: CRITICAL RESULT CALLED TO, READ BACK BY AND VERIFIED WITH: Matt McBane @ [phone] 04/27/16 by [facility]    Staphylococcus aureus DETECTED (A) NOT DETECTED Final    Comment: CRITICAL RESULT CALLED TO, READ BACK BY AND VERIFIED WITH: Matt McBane @ [phone] 04/27/16 by [facility]    Methicillin resistance DETECTED (A) NOT DETECTED Final    Comment: CRITICAL RESULT CALLED TO, READ BACK BY AND VERIFIED WITH: Matt McBane @ [phone] 04/27/16 by [facility]    Streptococcus species NOT DETECTED NOT DETECTED Final   Streptococcus agalactiae NOT DETECTED NOT DETECTED Final   Streptococcus pneumoniae NOT DETECTED NOT DETECTED Final   Streptococcus pyogenes NOT DETECTED NOT DETECTED Final   Acinetobacter baumannii NOT DETECTED NOT DETECTED Final   Enterobacteriaceae species NOT DETECTED NOT DETECTED Final   Enterobacter cloacae complex NOT DETECTED NOT DETECTED Final   Escherichia coli NOT DETECTED NOT DETECTED Final   Klebsiella oxytoca NOT DETECTED NOT DETECTED Final   Klebsiella pneumoniae NOT DETECTED NOT DETECTED Final   Proteus species NOT DETECTED NOT DETECTED Final   Serratia marcescens NOT DETECTED NOT DETECTED Final   Carbapenem resistance NOT DETECTED NOT DETECTED Final   Haemophilus influenzae NOT DETECTED NOT DETECTED Final   Neisseria meningitidis NOT DETECTED NOT DETECTED Final   Pseudomonas aeruginosa NOT DETECTED NOT DETECTED Final   Candida albicans NOT DETECTED NOT DETECTED Final   Candida glabrata NOT DETECTED NOT DETECTED Final   Candida krusei NOT DETECTED NOT DETECTED Final   Candida parapsilosis NOT DETECTED NOT DETECTED Final   Candida tropicalis NOT DETECTED NOT DETECTED Final    Coagulation Studies:  Recent Labs  04/26/16 1211 04/26/16 1745  LABPROT 15.2* 15.5*  INR 1.18 1.21    Urinalysis:  Recent Labs  04/26/16 1417  COLORURINE YELLOW*  LABSPEC 1.004*  PHURINE 6.0  GLUCOSEU NEGATIVE  HGBUR 1+*  BILIRUBINUR NEGATIVE  KETONESUR NEGATIVE   PROTEINUR NEGATIVE  NITRITE NEGATIVE  LEUKOCYTESUR NEGATIVE      Imaging: No results found.   Medications:   . sodium chloride 50 mL/hr at 04/27/16 0952   . ceFEPime (MAXIPIME) IV  2 g Intravenous Q12H  . docusate sodium  100 mg Oral BID  . LORazepam  0.5 mg Oral TID  . sodium chloride flush  3 mL Intravenous Q12H  . sodium chloride flush  3 mL Intravenous Q12H  . vancomycin  1,000 mg Intravenous Q12H   sodium chloride, albuterol, chlorpheniramine-HYDROcodone, ondansetron **OR** ondansetron (ZOFRAN) IV, polyethylene glycol, sodium chloride flush, zolpidem  Assessment/ Plan:  70 y.o. male with a PMHx of Hypertension, GERD, cirrhosis of the liver secondary to alcohol abuse, anxiety, umbilical hernia with leaking ascites fluid, chronic hyponatremia, who was admitted to [facility] on 04/26/2016 for evaluation of leaking umbilical hernia.   1. Acute on chronic hyponatremia, now severe, admitting Na 107. Baseline Na 125-130 2. Acute peritonitis K65.0 3. Cirrhosis of the liver. 4.  Leaking umbilical hernia.  Plan:  Serum sodium has come up to 118 which is the upper limits of acceptable correction. Therefore we have switched him to half-normal saline at 50 cc an hour earlier in the day. We will allow sodium to stabilize a bit before restarting 0.9 normal saline. Once normal saline is restarted we recommend giving it at a rate of 35 cc per hour. Continue treatment of underlying group B strep peritonitis with antibiotics as above. In addition he is extremely high surgical risk per surgery evaluation. They have recommended potential tertiary center referral.   LOS: 1 Lemoyne Scarpati 6/18/201712:50 PM

## 2016-04-27 NOTE — Progress Notes (Signed)
[facility] [place] [facility]   PATIENT NAME: Jeff Preston    MR#:  [id number]  DATE OF BIRTH:  1946-02-13  SUBJECTIVE:  CHIEF COMPLAINT:   Chief Complaint  Patient presents with  . hernia leaking fluid   . Weakness   Generalized weakness REVIEW OF SYSTEMS:  CONSTITUTIONAL: No fever,Generalized weakness.  EYES: No blurred or double vision.  EARS, NOSE, AND THROAT: No tinnitus or ear pain.  RESPIRATORY: No cough, shortness of breath, wheezing or hemoptysis.  CARDIOVASCULAR: No chest pain, orthopnea, edema.  GASTROINTESTINAL: No nausea, vomiting, diarrhea or abdominal pain.  GENITOURINARY: No dysuria, hematuria.  ENDOCRINE: No polyuria, nocturia,  HEMATOLOGY: No anemia, easy bruising or bleeding SKIN: No rash or lesion. MUSCULOSKELETAL: No joint pain or arthritis.   NEUROLOGIC: No tingling, numbness, weakness.  PSYCHIATRY: No anxiety or depression.   DRUG ALLERGIES:   Allergies  Allergen Reactions  . Dilantin [Phenytoin Sodium Extended] Rash  . Penicillins Rash and Other (See Comments)    Has patient had a PCN reaction causing immediate rash, facial/tongue/throat swelling, SOB or lightheadedness with hypotension: No Has patient had a PCN reaction causing severe rash involving mucus membranes or skin necrosis: No Has patient had a PCN reaction that required hospitalization No Has patient had a PCN reaction occurring within the last 10 years: No If all of the above answers are "NO", then may proceed with Cephalosporin use.    VITALS:  Blood pressure 119/61, pulse 84, temperature 97.7 F (36.5 C), temperature source Oral, resp. rate 18, height 5\' 9"  (1.753 m), weight 180 lb 4.8 oz (81.784 kg), SpO2 98 %.  PHYSICAL EXAMINATION:  GENERAL:  70 y.o.-year-old patient lying in the bed with no acute distress.  EYES: Pupils equal, round, reactive to light and accommodation. No scleral icterus. Extraocular muscles intact.  HEENT: Head atraumatic,  normocephalic. Oropharynx and nasopharynx clear.  NECK:  Supple, no jugular venous distention. No thyroid enlargement, no tenderness.  LUNGS: Normal breath sounds bilaterally, no wheezing, rales,rhonchi or crepitation. No use of accessory muscles of respiration.  CARDIOVASCULAR: S1, S2 normal. No murmurs, rubs, or gallops.  ABDOMEN: Soft, nontender, nondistended. Bowel sounds present. No organomegaly or mass.  EXTREMITIES: No pedal edema, cyanosis, or clubbing.  NEUROLOGIC: Cranial nerves II through XII are intact. Muscle strength 5/5 in all extremities. Sensation intact. Gait not checked.  PSYCHIATRIC: The patient is alert and oriented x 3.  SKIN: No obvious rash, lesion, or ulcer.    LABORATORY PANEL:   CBC  Recent Labs Lab 04/26/16 1211  WBC 15.9*  HGB 12.4*  HCT 35.5*  PLT 297   ------------------------------------------------------------------------------------------------------------------  Chemistries   Recent Labs Lab 04/27/16 0512  04/27/16 1135  NA 115*  < > 118*  K 3.9  --   --   CL 83*  --   --   CO2 23  --   --   GLUCOSE 93  --   --   BUN 12  --   --   CREATININE 1.02  --   --   CALCIUM 7.7*  --   --   AST 45*  --   --   ALT 15*  --   --   ALKPHOS 104  --   --   BILITOT 1.4*  --   --   < > = values in this interval not displayed. ------------------------------------------------------------------------------------------------------------------  Cardiac Enzymes No results for input(s): TROPONINI in the last 168 hours. ------------------------------------------------------------------------------------------------------------------  RADIOLOGY:  No results found.  EKG:   Orders placed or performed during the hospital encounter of 04/26/16  . ED EKG  . ED EKG    ASSESSMENT AND PLAN:   * Acute on chronic hyponatremia, Baseline Na 125-130. Likely due to dehydration and poor oral intake. Improving with normal saline at 50 ML per hour. Sodium level  increased to 118, normal saline was discontinued and started half-normal saline per Dr. Cherylann Ratel. Follow-up sodium level every 3 hours.  * Group B streptococcus SBP He was treated with ceftriaxone. Patient has penicillin allergy. Continue vancomycin and cefepime for now per Dr. Everlene Farrier.  * Cirrhosis with chronic ascites Arrange paracentesis tomorrow, hold lovenox today per Dr. Everlene Farrier.  *Umbilical hernia Patient has refused surgery in the past. Per Dr. 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 He was to stay here but I encouraged him to be transferred to a tertiary center [facility].   I discussed with Dr. Cherylann Ratel. All the records are reviewed and case discussed with Care Management/Social Workerr. Management plans discussed with the patient, family and they are in agreement.  CODE STATUS: DO NOT RESUSCITATE  TOTAL TIME TAKING CARE OF THIS PATIENT: 41 minutes.  Greater than 50% time was spent on coordination of care and face-to-face counseling.  POSSIBLE D/C IN 3 DAYS, DEPENDING ON CLINICAL CONDITION.   Shaune Pollack M.D on 04/27/2016 at 1:31 PM  Between 7am to 6pm - Pager - [phone]  After 6pm go to [email or web address] - password [email or web address] [facility]  [facility]  Office  [phone]  CC: Primary care physician; Lynnae Prude, MD

## 2016-04-27 NOTE — Progress Notes (Addendum)
CC: draining UH Subjective: No abdominal pain, weak.  Objective: Vital signs in last 24 hours: Temp:  [97.5 F (36.4 C)-98.1 F (36.7 C)] 97.5 F (36.4 C) (06/18 0529) Pulse Rate:  [65-91] 91 (06/18 0529) Resp:  [11-20] 20 (06/18 0529) BP: (119-163)/(61-89) 134/61 mmHg (06/18 0529) SpO2:  [94 %-100 %] 98 % (06/18 0529) Weight:  [81.784 kg (180 lb 4.8 oz)-90.719 kg (200 lb)] 81.784 kg (180 lb 4.8 oz) (06/18 0500) Last BM Date: 04/26/16  Intake/Output from previous day: 06/17 0701 - 06/18 0700 In: 723 [I.V.:723] Out: 1600 [Urine:1600] Intake/Output this shift: Total I/O In: -  Out: 425 [Urine:425]  Physical exam: NAD, chronically ill, malnourished, pale Neuro: alert, he does have significant delay in cognition, mild difficulty understanding the situation. HE is oriented Abd: soft, nt, draining UH w clear ascitis  w erythema, no incarceration, no peritonitis Ext: mild pedal edema  Lab Results: CBC   Recent Labs  04/26/16 1211  WBC 15.9*  HGB 12.4*  HCT 35.5*  PLT 297   BMET  Recent Labs  04/26/16 1211  04/26/16 2121 04/27/16 0512  NA 107*  < > 111* 115*  K 4.0  --   --  3.9  CL 71*  --   --  83*  CO2 23  --   --  23  GLUCOSE 112*  --   --  93  BUN 11  --   --  12  CREATININE 1.07  --   --  1.02  CALCIUM 8.1*  --   --  7.7*  < > = values in this interval not displayed. PT/INR  Recent Labs  04/26/16 1211 04/26/16 1745  LABPROT 15.2* 15.5*  INR 1.18 1.21   ABG No results for input(s): PHART, HCO3 in the last 72 hours.  Invalid input(s): PCO2, PO2  Studies/Results: No results found.  Anti-infectives: Anti-infectives    Start     Dose/Rate Route Frequency Ordered Stop   04/27/16 1000  cefTRIAXone (ROCEPHIN) 1 g in dextrose 5 % 50 mL IVPB  Status:  Discontinued     1 g 100 mL/hr over 30 Minutes Intravenous Every 24 hours 04/26/16 1538 04/26/16 1758   04/27/16 0030  vancomycin (VANCOCIN) IVPB 1000 mg/200 mL premix     1,000 mg 200 mL/hr over  60 Minutes Intravenous Every 12 hours 04/26/16 1845     04/26/16 2330  vancomycin (VANCOCIN) IVPB 1000 mg/200 mL premix  Status:  Discontinued     1,000 mg 200 mL/hr over 60 Minutes Intravenous Every 12 hours 04/26/16 1842 04/26/16 1845   04/26/16 2200  ceFEPIme (MAXIPIME) 2 g in dextrose 5 % 50 mL IVPB     2 g 100 mL/hr over 30 Minutes Intravenous Every 12 hours 04/26/16 1758     04/26/16 1815  vancomycin (VANCOCIN) 1,250 mg in sodium chloride 0.9 % 250 mL IVPB     1,250 mg 166.7 mL/hr over 90 Minutes Intravenous  Once 04/26/16 1814 04/26/16 1953   04/26/16 1500  cefTRIAXone (ROCEPHIN) 1 g in dextrose 5 % 50 mL IVPB     1 g 100 mL/hr over 30 Minutes Intravenous  Once 04/26/16 1454 04/26/16 1653      Assessment/Plan: Alcoholic cirrhosis w draining UH group B from culture Continue vancomycin and cefepime for now 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 He was to stay here but I encouraged him to be transferred to a tertiary center [facility]. We'll defer this to his final decision and his primary team No surgical intervention at this time a time Extensive counseling provided  More than 35 min spent in this encounter Sterling Bigiego Blanche Gallien, MD, [facility]  04/27/2016

## 2016-04-28 ENCOUNTER — Inpatient Hospital Stay: Payer: Medicare Other

## 2016-04-28 DIAGNOSIS — K7460 Unspecified cirrhosis of liver: Secondary | ICD-10-CM

## 2016-04-28 LAB — SODIUM
SODIUM: 123 mmol/L — AB (ref 135–145)
Sodium: 119 mmol/L — CL (ref 135–145)
Sodium: 120 mmol/L — ABNORMAL LOW (ref 135–145)

## 2016-04-28 LAB — VANCOMYCIN, TROUGH: VANCOMYCIN TR: 21 ug/mL — AB (ref 10–20)

## 2016-04-28 MED ORDER — OFLOXACIN 0.3 % OP SOLN
1.0000 [drp] | Freq: Four times a day (QID) | OPHTHALMIC | Status: DC
  Administered 2016-04-28 – 2016-05-01 (×13): 1 [drp] via OPHTHALMIC
  Filled 2016-04-28: qty 5

## 2016-04-28 MED ORDER — FUROSEMIDE 20 MG PO TABS
20.0000 mg | ORAL_TABLET | Freq: Two times a day (BID) | ORAL | Status: DC
  Administered 2016-04-28: 20 mg via ORAL
  Filled 2016-04-28: qty 1

## 2016-04-28 MED ORDER — ENOXAPARIN SODIUM 40 MG/0.4ML ~~LOC~~ SOLN
40.0000 mg | SUBCUTANEOUS | Status: DC
  Administered 2016-04-28 – 2016-04-30 (×3): 40 mg via SUBCUTANEOUS
  Filled 2016-04-28 (×3): qty 0.4

## 2016-04-28 MED ORDER — VANCOMYCIN HCL IN DEXTROSE 1-5 GM/200ML-% IV SOLN
1000.0000 mg | INTRAVENOUS | Status: DC
  Administered 2016-04-29 – 2016-04-30 (×2): 1000 mg via INTRAVENOUS
  Filled 2016-04-28 (×3): qty 200

## 2016-04-28 MED ORDER — PANTOPRAZOLE SODIUM 40 MG PO TBEC
40.0000 mg | DELAYED_RELEASE_TABLET | Freq: Every day | ORAL | Status: DC
  Administered 2016-04-28 – 2016-05-01 (×4): 40 mg via ORAL
  Filled 2016-04-28 (×4): qty 1

## 2016-04-28 MED ORDER — IBUPROFEN 400 MG PO TABS
200.0000 mg | ORAL_TABLET | Freq: Four times a day (QID) | ORAL | Status: DC | PRN
  Administered 2016-04-28: 400 mg via ORAL
  Filled 2016-04-28 (×4): qty 1

## 2016-04-28 MED ORDER — SIMETHICONE 80 MG PO CHEW: 160.0000 mg | CHEWABLE_TABLET | Freq: Four times a day (QID) | ORAL | Status: DC | PRN

## 2016-04-28 MED ORDER — IBUPROFEN 400 MG PO TABS
200.0000 mg | ORAL_TABLET | Freq: Four times a day (QID) | ORAL | Status: DC | PRN
  Administered 2016-04-28: 400 mg via ORAL
  Filled 2016-04-28: qty 1

## 2016-04-28 MED ORDER — SPIRONOLACTONE 25 MG PO TABS
50.0000 mg | ORAL_TABLET | Freq: Every day | ORAL | Status: DC
  Administered 2016-04-28: 50 mg via ORAL
  Filled 2016-04-28: qty 2

## 2016-04-28 MED ORDER — NADOLOL 20 MG PO TABS
20.0000 mg | ORAL_TABLET | Freq: Every day | ORAL | Status: DC
  Administered 2016-04-28 – 2016-05-01 (×4): 20 mg via ORAL
  Filled 2016-04-28 (×4): qty 1

## 2016-04-28 MED ORDER — ZOLPIDEM TARTRATE 5 MG PO TABS
10.0000 mg | ORAL_TABLET | Freq: Every evening | ORAL | Status: DC | PRN
  Administered 2016-04-28 – 2016-04-30 (×3): 10 mg via ORAL
  Filled 2016-04-28 (×3): qty 2

## 2016-04-28 MED ORDER — BISACODYL 5 MG PO TBEC: 5.0000 mg | DELAYED_RELEASE_TABLET | Freq: Every day | ORAL | Status: DC | PRN

## 2016-04-28 NOTE — Consult Note (Signed)
Pharmacy Antibiotic Note  Jeff Preston is a 70 y.o. male [facility] with strep from ascitis secondary peritonitis Pharmacy has been consulted for vancomycin dosing. (patient also on Cefepime)  Plan: Vancomycin 1250mg  once then in 6 hours start vancomycin 1g q 12 for stacked dosing Vancomycin 1000 IV every 12 hours.  Goal trough 15-20 mcg/mL.  Will check trough prior to 5th dose 6/19 @ 1200  6/19- Vancomycin trough= 21 mcg/ml. Will adjust dosing to 1 gm IV q18h. Next trough 6/21 at 1830.    Height: 5\' 9"  (175.3 cm) Weight: 179 lb 12.8 oz (81.557 kg) IBW/kg (Calculated) : 70.7  Temp (24hrs), Avg:97.9 F (36.6 C), Min:97.6 F (36.4 C), Max:98.1 F (36.7 C)   Recent Labs Lab 04/26/16 1211 04/26/16 1417 04/26/16 1745 04/27/16 0512 04/28/16 1253  WBC 15.9*  --   --   --   --   CREATININE 1.07  --   --  1.02  --   LATICACIDVEN  --  1.0 1.0  --   --   VANCOTROUGH  --   --   --   --  21*    Estimated Creatinine Clearance: 68.4 mL/min (by C-G formula based on Cr of 1.02).    Allergies  Allergen Reactions  . Dilantin [Phenytoin Sodium Extended] Rash  . Penicillins Rash and Other (See Comments)    Has patient had a PCN reaction causing immediate rash, facial/tongue/throat swelling, SOB or lightheadedness with hypotension: No Has patient had a PCN reaction causing severe rash involving mucus membranes or skin necrosis: No Has patient had a PCN reaction that required hospitalization No Has patient had a PCN reaction occurring within the last 10 years: No If all of the above answers are "NO", then may proceed with Cephalosporin use.    Antimicrobials this admission: vancomycin 6/17 >>  cefepime 6/17 >>   Dose adjustments this admission: 6/19 Vanc trough= 21. Adjusted Vanc from 1 gram IV q12h to q18h.  Microbiology results: 6/17 WUJ:[id number]:Staph Aureus, MecA + 6/15 fluid peritoneal few group b strep- Strep agalactiae; holding for possible anaerobe  6/18 06:45 BCID  returned Staph aureus, Mec resistance from blood cultures. Patient already on vancomycin. No new recommendations.  Thank you for allowing pharmacy to be a part of this patient's care.  Bari MantisKristin Sameera Betton PharmD Clinical Pharmacist 04/28/2016  1:40 PM

## 2016-04-28 NOTE — Care Management Important Message (Signed)
Important Message  Patient Details  Name: Jeff Preston MRN: [id number] Date of Birth: Dec 01, 1945   Medicare Important Message Given:  Yes    Eber HongGreene, Ariyah Sedlack R, RN 04/28/2016, 10:56 AM

## 2016-04-28 NOTE — Progress Notes (Addendum)
[facility] - [place] at [facility]   PATIENT NAME: Jeff Preston    MR#:  [id number]  DATE OF BIRTH:  [birth year]  SUBJECTIVE:  CHIEF COMPLAINT:   Chief Complaint  Patient presents with  . hernia leaking fluid   . Weakness   Generalized weakness REVIEW OF SYSTEMS:  CONSTITUTIONAL: No fever,Generalized weakness.  EYES: No blurred or double vision.  EARS, NOSE, AND THROAT: No tinnitus or ear pain.  RESPIRATORY: No cough, shortness of breath, wheezing or hemoptysis.  CARDIOVASCULAR: No chest pain, orthopnea, edema.  GASTROINTESTINAL: No nausea, vomiting, diarrhea or abdominal pain.  GENITOURINARY: No dysuria, hematuria.  ENDOCRINE: No polyuria, nocturia,  HEMATOLOGY: No anemia, easy bruising or bleeding SKIN: No rash or lesion. MUSCULOSKELETAL: No joint pain or arthritis.   NEUROLOGIC: No tingling, numbness, weakness.  PSYCHIATRY: No anxiety or depression.   DRUG ALLERGIES:   Allergies  Allergen Reactions  . Dilantin [Phenytoin Sodium Extended] Rash  . Penicillins Rash and Other (See Comments)    Has patient had a PCN reaction causing immediate rash, facial/tongue/throat swelling, SOB or lightheadedness with hypotension: No Has patient had a PCN reaction causing severe rash involving mucus membranes or skin necrosis: No Has patient had a PCN reaction that required hospitalization No Has patient had a PCN reaction occurring within the last 10 years: No If all of the above answers are "NO", then may proceed with Cephalosporin use.    VITALS:  Blood pressure 103/56, pulse 64, temperature 97.6 F (36.4 C), temperature source Oral, resp. rate 18, height  (1.753 m), weight 179 lb 12.8 oz (81.557 kg), SpO2 99 %.  PHYSICAL EXAMINATION:  GENERAL:  70 y.o.-year-old patient lying in the bed with no acute distress.  EYES: Pupils equal, round, reactive to light and accommodation. No scleral icterus. Extraocular muscles intact.  HEENT: Head atraumatic,  normocephalic. Oropharynx and nasopharynx clear.  NECK:  Supple, no jugular venous distention. No thyroid enlargement, no tenderness.  LUNGS: Normal breath sounds bilaterally, no wheezing, rales,rhonchi or crepitation. No use of accessory muscles of respiration.  CARDIOVASCULAR: S1, S2 normal. No murmurs, rubs, or gallops.  ABDOMEN: Soft, nontender, nondistended. Bowel sounds present. No organomegaly or mass.  EXTREMITIES: No pedal edema, cyanosis, or clubbing.  NEUROLOGIC: Cranial nerves II through XII are intact. Muscle strength 5/5 in all extremities. Sensation intact. Gait not checked.  PSYCHIATRIC: The patient is alert and oriented x 3.  SKIN: No obvious rash, lesion, or ulcer.    LABORATORY PANEL:   CBC  Recent Labs Lab 04/26/16 1211  WBC 15.9*  HGB 12.4*  HCT 35.5*  PLT 297   ------------------------------------------------------------------------------------------------------------------  Chemistries   Recent Labs Lab 04/27/16 0512  04/28/16 0838  NA 115*  < > 123*  K 3.9  --   --   CL 83*  --   --   CO2 23  --   --   GLUCOSE 93  --   --   BUN 12  --   --   CREATININE 1.02  --   --   CALCIUM 7.7*  --   --   AST 45*  --   --   ALT 15*  --   --   ALKPHOS 104  --   --   BILITOT 1.4*  --   --   < > = values in this interval not displayed. ------------------------------------------------------------------------------------------------------------------  Cardiac Enzymes No results for input(s): TROPONINI in the last 168 hours. ------------------------------------------------------------------------------------------------------------------  RADIOLOGY:  US Abdomen Limited  04/28/2016  CLINICAL DATA:  70 year old male with recurrent ascites. Status post paracentesis on 04/16/2016 with removal of 9.7 L at that time. Subsequent encounter. EXAM: LIMITED ABDOMEN ULTRASOUND FOR ASCITES TECHNIQUE: Limited ultrasound survey for ascites was performed in all four abdominal  quadrants. COMPARISON:  04/16/2016 and earlier FINDINGS: Imaging of the 4 quadrants of the abdomen today is performed but no ascites is identified. IMPRESSION: Negative for recurrent ascites at this time following 04/16/2016 paracentesis. Electronically Signed   By: Odessa FlemingH  Hall M.D.   On: 04/28/2016 10:08    EKG:   Orders placed or performed during the hospital encounter of 04/26/16  . ED EKG  . ED EKG    ASSESSMENT AND PLAN:   * Acute on chronic hyponatremia, Baseline Na 125-130. Likely due to dehydration and poor oral intake. Improving with normal saline at 50 ML per hour. Sodium level increased to 118, normal saline was discontinued and started half-normal saline per Dr. Cherylann RatelLateef.  Na 123 today, Follow-up sodium level.  * Group B streptococcus SBP; bacteremia (MRSA ), fluid cutlure: STAPHYLOCOCCUS AUREUS. He was treated with ceftriaxone. Patient has penicillin allergy. Continue vancomycin and cefepime, ID consult.  * Cirrhosis with chronic ascites s/p paracentesis today. Continue nadolol and spironolactone.  *Umbilical hernia Per Dr. 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 Paracentesis scheduled for today, would continue ABx and diuresis. Given his cirrhosis he is at high risk for any operative intervention, with that being said would recommend that a draining umbilical hernia have medical optimization and then operative intervention at a center that can manage liver patient's and has the appropriate blood products available such as [facility] or [facility]. No need for emergent operation.  All the records are reviewed and case discussed with Care Management/Social Workerr. Management plans discussed with the patient, family and they are in agreement.  CODE STATUS: DO NOT RESUSCITATE  TOTAL TIME TAKING CARE OF THIS PATIENT: 38 minutes.  Greater than 50% time was spent on coordination of care and face-to-face counseling.  POSSIBLE D/C IN 2-3  DAYS, DEPENDING ON CLINICAL CONDITION.   Shaune Pollackhen, Zadaya Cuadra M.D on 04/28/2016 at 2:08 PM  Between 7am to 6pm - Pager - [phone]  After 6pm go to [email or web address] - password [email or web address] [facility]  [facility]  Office  [phone]  CC: Primary care physician; Lynnae PrudeELLIOTT, [name], MD

## 2016-04-28 NOTE — Progress Notes (Signed)
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  Patient pleasant this AM and states he feels much better. He understands the risks of surgery and has been trying to avoid operative intervention.  The patient denies any pain today but states he has skin itching all over and muscle cramps in his legs.  He is currently NPO for paracentesis today but ate yesterday without any issues.    Filed Vitals:   04/27/16 2123 04/28/16 0509  BP: 149/67 140/75  Pulse: 84 86  Temp:  98.1 F (36.7 C)  Resp: 18 17   I/O last 3 completed shifts: In: 2000 [P.O.:240; I.V.:1360; IV Piggyback:400] Out: 6300 [Urine:5225; Other:1075]     PE:  Gen: NAD Res: CTAB/L  Cardio: RRR Abd: soft, fluid wave, umbilical hernia with serous drainage no erythema or purulence Ext: 2+ pulses, no edema   CBC Latest Ref Rng 04/26/2016 03/16/2016 03/15/2016  WBC 3.8 - 10.6 K/uL 15.9(H) 7.6 5.8  Hemoglobin 13.0 - 18.0 g/dL 12.4(L) 11.9(L) 11.6(L)  Hematocrit 40.0 - 52.0 % 35.5(L) 35.0(L) 34.5(L)  Platelets 150 - 440 K/uL 297 266 218    CMP Latest Ref Rng 04/28/2016 04/28/2016 04/27/2016  Glucose 65 - 99 mg/dL - - -  BUN 6 - 20 mg/dL - - -  Creatinine 0.980.61 - 1.24 mg/dL - - -  Sodium 119135 - 147145 mmol/L 120(L) 119(LL) 120(L)  Potassium 3.5 - 5.1 mmol/L - - -  Chloride 101 - 111 mmol/L - - -  CO2 22 - 32 mmol/L - - -  Calcium 8.9 - 10.3 mg/dL - - -  Total Protein 6.5 - 8.1 g/dL - - -  Total Bilirubin 0.3 - 1.2 mg/dL - - -  Alkaline Phos 38 - 126 U/L - - -  AST 15 - 41 U/L - - -  ALT 17 - 63 U/L - - -    A/p: 70 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 Paracentesis scheduled for today, would continue ABx and diuresis.  Given his cirrhosis he is at high risk for any operative intervention, with that being said would recommend that a draining umbilical hernia have medical optimization and then operative intervention at a center that can manage liver patient's and has the  appropriate blood products available such as [facility] or [facility].  No need for emergent operation.

## 2016-04-28 NOTE — Progress Notes (Signed)
[facility]  ROUNDING NOTE   Subjective:   Na 123.  NS at 62mL/hr  No recurrent ascites on ultrasound   Objective:  Vital signs in last 24 hours:  Temp:  [97.7 F (36.5 C)-98.1 F (36.7 C)] 98.1 F (36.7 C) (06/19 0509) Pulse Rate:  [84-86] 86 (06/19 0509) Resp:  [17-18] 18 (06/19 0900) BP: (119-149)/(61-75) 145/67 mmHg (06/19 0900) SpO2:  [97 %-100 %] 97 % (06/19 0900) Weight:  [81.557 kg (179 lb 12.8 oz)] 81.557 kg (179 lb 12.8 oz) (06/19 0541)  Weight change: -9.163 kg (-20 lb 3.2 oz) Filed Weights   04/26/16 1208 04/27/16 0500 04/28/16 0541  Weight: 90.719 kg (200 lb) 81.784 kg (180 lb 4.8 oz) 81.557 kg (179 lb 12.8 oz)    Intake/Output: I/O last 3 completed shifts: In: 2000 [P.O.:240; I.V.:1360; IV Piggyback:400] Out: 6300 [Urine:5225; Other:1075]   Intake/Output this shift:  Total I/O In: -  Out: 550 [Urine:400; Other:150]  Physical Exam: General: NAD, resting in bed  Head: Normocephalic, atraumatic. Moist oral mucosal membranes  Eyes: Anicteric  Neck: Supple, trachea midline  Lungs:  Clear to auscultation, normal effort  Heart: S1S2 no rubs  Abdomen:  Umbilical hernia with ostomy bag  Extremities: No peripheral edema.  Neurologic: Slow to answer questions, but awake and alert  Skin: No lesions       Basic Metabolic Panel:  Recent Labs Lab 04/26/16 1211  04/27/16 0512  04/27/16 2018 04/27/16 2317 04/28/16 0247 04/28/16 0533 04/28/16 0838  NA 107*  < > 115*  < > 119* 120* 119* 120* 123*  K 4.0  --  3.9  --   --   --   --   --   --   CL 71*  --  83*  --   --   --   --   --   --   CO2 23  --  23  --   --   --   --   --   --   GLUCOSE 112*  --  93  --   --   --   --   --   --   BUN 11  --  12  --   --   --   --   --   --   CREATININE 1.07  --  1.02  --   --   --   --   --   --   CALCIUM 8.1*  --  7.7*  --   --   --   --   --   --   < > = values in this interval not displayed.  Liver Function Tests:  Recent Labs Lab  04/26/16 1211 04/26/16 1745 04/27/16 0512  AST 47* 45* 45*  ALT 17 16* 15*  ALKPHOS 121 115 104  BILITOT 2.1* 1.5* 1.4*  PROT 6.7 6.2* 6.0*  ALBUMIN 3.0* 2.7* 2.7*   No results for input(s): LIPASE, AMYLASE in the last 168 hours. No results for input(s): AMMONIA in the last 168 hours.  CBC:  Recent Labs Lab 04/26/16 1211  WBC 15.9*  HGB 12.4*  HCT 35.5*  MCV 77.0*  PLT 297    Cardiac Enzymes: No results for input(s): CKTOTAL, CKMB, CKMBINDEX, TROPONINI in the last 168 hours.  BNP: Invalid input(s): POCBNP  CBG: No results for input(s): GLUCAP in the last 168 hours.  Microbiology: Results for orders placed or performed during the hospital encounter of 04/26/16  Culture, blood (  Routine X 2) w Reflex to ID Panel     Status: None (Preliminary result)   Collection Time: 04/26/16  2:19 PM  Result Value Ref Range Status   Specimen Description BLOOD LEFT HAND  Final   Special Requests BOTTLES DRAWN AEROBIC AND ANAEROBIC  1CC  Final   Culture NO GROWTH < 24 HOURS  Final   Report Status PENDING  Incomplete  Culture, blood (Routine X 2) w Reflex to ID Panel     Status: Abnormal (Preliminary result)   Collection Time: 04/26/16  2:19 PM  Result Value Ref Range Status   Specimen Description BLOOD RIGHT HAND  Final   Special Requests BOTTLES DRAWN AEROBIC AND ANAEROBIC  5CC  Final   Culture  Setup Time   Final    GRAM POSITIVE COCCI AEROBIC BOTTLE ONLY CRITICAL RESULT CALLED TO, READ BACK BY AND VERIFIED WITH: MATT MCBANE @ 1610 04/27/16 BY [facility]    Culture (A)  Final    STAPHYLOCOCCUS AUREUS SUSCEPTIBILITIES TO FOLLOW Performed at [facility]    Report Status PENDING  Incomplete  Blood Culture ID Panel (Reflexed)     Status: Abnormal   Collection Time: 04/26/16  2:19 PM  Result Value Ref Range Status   Enterococcus species NOT DETECTED NOT DETECTED Final   Vancomycin resistance NOT DETECTED NOT DETECTED Final   Listeria monocytogenes NOT DETECTED NOT DETECTED  Final   Staphylococcus species DETECTED (A) NOT DETECTED Final    Comment: CRITICAL RESULT CALLED TO, READ BACK BY AND VERIFIED WITH: Matt McBane @ [phone] 04/27/16 by [facility]    Staphylococcus aureus DETECTED (A) NOT DETECTED Final    Comment: CRITICAL RESULT CALLED TO, READ BACK BY AND VERIFIED WITH: Matt McBane @ [phone] 04/27/16 by [facility]    Methicillin resistance DETECTED (A) NOT DETECTED Final    Comment: CRITICAL RESULT CALLED TO, READ BACK BY AND VERIFIED WITH: Matt McBane @ [phone] 04/27/16 by [facility]    Streptococcus species NOT DETECTED NOT DETECTED Final   Streptococcus agalactiae NOT DETECTED NOT DETECTED Final   Streptococcus pneumoniae NOT DETECTED NOT DETECTED Final   Streptococcus pyogenes NOT DETECTED NOT DETECTED Final   Acinetobacter baumannii NOT DETECTED NOT DETECTED Final   Enterobacteriaceae species NOT DETECTED NOT DETECTED Final   Enterobacter cloacae complex NOT DETECTED NOT DETECTED Final   Escherichia coli NOT DETECTED NOT DETECTED Final   Klebsiella oxytoca NOT DETECTED NOT DETECTED Final   Klebsiella pneumoniae NOT DETECTED NOT DETECTED Final   Proteus species NOT DETECTED NOT DETECTED Final   Serratia marcescens NOT DETECTED NOT DETECTED Final   Carbapenem resistance NOT DETECTED NOT DETECTED Final   Haemophilus influenzae NOT DETECTED NOT DETECTED Final   Neisseria meningitidis NOT DETECTED NOT DETECTED Final   Pseudomonas aeruginosa NOT DETECTED NOT DETECTED Final   Candida albicans NOT DETECTED NOT DETECTED Final   Candida glabrata NOT DETECTED NOT DETECTED Final   Candida krusei NOT DETECTED NOT DETECTED Final   Candida parapsilosis NOT DETECTED NOT DETECTED Final   Candida tropicalis NOT DETECTED NOT DETECTED Final    Coagulation Studies:  Recent Labs  04/26/16 1211 04/26/16 1745  LABPROT 15.2* 15.5*  INR 1.18 1.21    Urinalysis:  Recent Labs  04/26/16 1417  COLORURINE YELLOW*  LABSPEC 1.004*  PHURINE 6.0  GLUCOSEU NEGATIVE  HGBUR 1+*  BILIRUBINUR  NEGATIVE  KETONESUR NEGATIVE  PROTEINUR NEGATIVE  NITRITE NEGATIVE  LEUKOCYTESUR NEGATIVE      Imaging: US Abdomen Limited  04/28/2016  CLINICAL DATA:  70 year old male with recurrent ascites. Status post paracentesis on 04/16/2016 with removal of 9.7 L at that time. Subsequent encounter. EXAM: LIMITED ABDOMEN ULTRASOUND FOR ASCITES TECHNIQUE: Limited ultrasound survey for ascites was performed in all four abdominal quadrants. COMPARISON:  04/16/2016 and earlier FINDINGS: Imaging of the 4 quadrants of the abdomen today is performed but no ascites is identified. IMPRESSION: Negative for recurrent ascites at this time following 04/16/2016 paracentesis. Electronically Signed   By: Odessa FlemingH  Hall M.D.   On: 04/28/2016 10:08     Medications:   . sodium chloride 50 mL/hr at 04/28/16 0712   . ceFEPime (MAXIPIME) IV  2 g Intravenous Q12H  . docusate sodium  100 mg Oral BID  . furosemide  20 mg Oral BID  . LORazepam  0.5 mg Oral TID  . nadolol  20 mg Oral Daily  . ofloxacin  1 drop Left Eye QID  . pantoprazole  40 mg Oral Daily  . sodium chloride flush  3 mL Intravenous Q12H  . sodium chloride flush  3 mL Intravenous Q12H  . spironolactone  50 mg Oral Daily  . vancomycin  1,000 mg Intravenous Q12H   sodium chloride, albuterol, bisacodyl, chlorpheniramine-HYDROcodone, ibuprofen, ondansetron **OR** ondansetron (ZOFRAN) IV, polyethylene glycol, simethicone, sodium chloride flush, zolpidem  Assessment/ Plan:  70 y.o. male with a PMHx of Hypertension, GERD, cirrhosis of the liver secondary to alcohol abuse, anxiety, umbilical hernia with leaking ascites fluid, chronic hyponatremia, who [facility] for evaluation of leaking umbilical hernia.   1. Acute on chronic hyponatremia: Baseline Na 125-130 - continue IV NS - Continue to monitor.   2. Acute peritonitis K65.0: multiple organisms - empiric cefepime and vancomycin.  - status post large volume paracentesis on 6/7.    3. 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   4. Umbilical Hernia: leaking. Followed by Surgery   LOS: 2 Stuart Mirabile 6/19/201711:52 AM

## 2016-04-28 NOTE — Consult Note (Signed)
[facility] Infectious Disease     Reason for Consult:Staph aureus bacteremia    Referring Physician: Estanislado Spire Date of Admission:  04/26/2016   Active Problems:   Hyponatremia   Umbilical hernia with gangrene   Hepatic cirrhosis (HCC)   HPI: Jeff Preston is a 70 y.o. male with alcoholic ascites and a draining ascites site at umbilical hernia with prior ascites cx growing GBS and staph aureus now with MRSA bacteremia. He currently denies fevers, chills, sweats, abd pain.  Denies skin lesions, boils.   Past Medical History  Diagnosis Date  . Hypertension   . Depression   . GERD (gastroesophageal reflux disease)   . Cirrhosis ([place])   . Anxiety   . Umbilical hernia   . Ascites   . Chronic hyponatremia   . DNR (do not resuscitate)    Past Surgical History  Procedure Laterality Date  . Throat surgery    . Cholecystectomy     Social History  Substance Use Topics  . Smoking status: Former Research scientist (life sciences)  . Smokeless tobacco: None  . Alcohol Use: No   Family History  Problem Relation Age of Onset  . CAD Mother     Allergies:  Allergies  Allergen Reactions  . Dilantin [Phenytoin Sodium Extended] Rash  . Penicillins Rash and Other (See Comments)    Has patient had a PCN reaction causing immediate rash, facial/tongue/throat swelling, SOB or lightheadedness with hypotension: No Has patient had a PCN reaction causing severe rash involving mucus membranes or skin necrosis: No Has patient had a PCN reaction that required hospitalization No Has patient had a PCN reaction occurring within the last 10 years: No If all of the above answers are "NO", then may proceed with Cephalosporin use.    Current antibiotics: Antibiotics Given (last 72 hours)    Date/Time Action Medication Dose Rate   04/26/16 1823 Given   vancomycin (VANCOCIN) 1,250 mg in sodium chloride 0.9 % 250 mL IVPB 1,250 mg 166.7 mL/hr   04/26/16 2112 Given   ceFEPIme (MAXIPIME) 2 g in dextrose 5 % 50 mL IVPB 2 g  100 mL/hr   04/27/16 0045 Given   vancomycin (VANCOCIN) IVPB 1000 mg/200 mL premix 1,000 mg 200 mL/hr   04/27/16 1224 Given   ceFEPIme (MAXIPIME) 2 g in dextrose 5 % 50 mL IVPB 2 g 100 mL/hr   04/27/16 1350 Given   vancomycin (VANCOCIN) IVPB 1000 mg/200 mL premix 1,000 mg 200 mL/hr   04/27/16 2204 Given   ceFEPIme (MAXIPIME) 2 g in dextrose 5 % 50 mL IVPB 2 g 100 mL/hr   04/27/16 2351 Given   vancomycin (VANCOCIN) IVPB 1000 mg/200 mL premix 1,000 mg 200 mL/hr   04/28/16 1134 Given   ceFEPIme (MAXIPIME) 2 g in dextrose 5 % 50 mL IVPB 2 g 100 mL/hr   04/28/16 1300 Given   vancomycin (VANCOCIN) IVPB 1000 mg/200 mL premix 1,000 mg 200 mL/hr      MEDICATIONS: . ceFEPime (MAXIPIME) IV  2 g Intravenous Q12H  . docusate sodium  100 mg Oral BID  . furosemide  20 mg Oral BID  . LORazepam  0.5 mg Oral TID  . nadolol  20 mg Oral Daily  . ofloxacin  1 drop Left Eye QID  . pantoprazole  40 mg Oral Daily  . sodium chloride flush  3 mL Intravenous Q12H  . sodium chloride flush  3 mL Intravenous Q12H  . spironolactone  50 mg Oral Daily  . [START ON 04/29/2016]  vancomycin  1,000 mg Intravenous Q18H    Review of Systems - 11 systems reviewed and negative per HPI   OBJECTIVE: Temp:  [97.6 F (36.4 C)-98.1 F (36.7 C)] 97.6 F (36.4 C) (06/19 1221) Pulse Rate:  [64-86] 64 (06/19 1221) Resp:  [17-18] 18 (06/19 1221) BP: (103-149)/(56-75) 103/56 mmHg (06/19 1221) SpO2:  [97 %-100 %] 99 % (06/19 1221) Weight:  [81.557 kg (179 lb 12.8 oz)] 81.557 kg (179 lb 12.8 oz) (06/19 0541) Physical Exam  Constitutional: He is oriented to person, place, and time. Slowed mentation, dishelved, chronically ill apperaing HENT: anictric Mouth/Throat: Oropharynx is clear and dry . No oropharyngeal exudate.  Cardiovascular: Normal rate, regular rhythm and normal heart sounds. Pulmonary/Chest: Effort normal and breath sounds normal. No respiratory distress. He has no wheezes.  Abdominal: Soft. midl distention,  lower abd with a drainage site with colostomy bag over it.  There is some maceration and raw skine at the site.   ymphadenopathy: He has no cervical adenopathy.  Neurological: He is alert but slowed mentation  Skin: Skin is warm and dry. No rash noted. No erythema.  Psychiatric: He has a normal mood and affect. His behavior is normal.    LABS: Results for orders placed or performed during the hospital encounter of 04/26/16 (from the past 48 hour(s))  Lactic acid, plasma     Status: None   Collection Time: 04/26/16  5:45 PM  Result Value Ref Range   Lactic Acid, Venous 1.0 0.5 - 2.0 mmol/L  Sodium     Status: Abnormal   Collection Time: 04/26/16  5:45 PM  Result Value Ref Range   Sodium 110 (LL) 135 - 145 mmol/L    Comment: CRITICAL RESULT CALLED TO, READ BACK BY AND VERIFIED WITH TONYA CRANFORD RN AT 3664 04/26/16 MSS.   Hepatic function panel     Status: Abnormal   Collection Time: 04/26/16  5:45 PM  Result Value Ref Range   Total Protein 6.2 (L) 6.5 - 8.1 g/dL   Albumin 2.7 (L) 3.5 - 5.0 g/dL   AST 45 (H) 15 - 41 U/L   ALT 16 (L) 17 - 63 U/L   Alkaline Phosphatase 115 38 - 126 U/L   Total Bilirubin 1.5 (H) 0.3 - 1.2 mg/dL   Bilirubin, Direct 0.5 0.1 - 0.5 mg/dL   Indirect Bilirubin 1.0 (H) 0.3 - 0.9 mg/dL  Protime-INR     Status: Abnormal   Collection Time: 04/26/16  5:45 PM  Result Value Ref Range   Prothrombin Time 15.5 (H) 11.4 - 15.0 seconds   INR 1.21   Sodium     Status: Abnormal   Collection Time: 04/26/16  7:44 PM  Result Value Ref Range   Sodium 110 (LL) 135 - 145 mmol/L    Comment: CRITICAL RESULT CALLED TO, READ BACK BY AND VERIFIED WITH LAUREN HOBBS AT 2015 ON 04/26/16 BY [facility]   Sodium     Status: Abnormal   Collection Time: 04/26/16  9:21 PM  Result Value Ref Range   Sodium 111 (LL) 135 - 145 mmol/L    Comment: CRITICAL RESULT CALLED TO, READ BACK BY AND VERIFIED WITH LAUREN HOBBS AT 2144 04/26/16.PMH  Comprehensive metabolic panel     Status: Abnormal    Collection Time: 04/27/16  5:12 AM  Result Value Ref Range   Sodium 115 (LL) 135 - 145 mmol/L    Comment: CRITICAL RESULT CALLED TO, READ BACK BY AND VERIFIED WITH LAUREN HOBBS AT 0604 04/27/16.PMH  Potassium 3.9 3.5 - 5.1 mmol/L   Chloride 83 (L) 101 - 111 mmol/L   CO2 23 22 - 32 mmol/L   Glucose, Bld 93 65 - 99 mg/dL   BUN 12 6 - 20 mg/dL   Creatinine, Ser 1.02 0.61 - 1.24 mg/dL   Calcium 7.7 (L) 8.9 - 10.3 mg/dL   Total Protein 6.0 (L) 6.5 - 8.1 g/dL   Albumin 2.7 (L) 3.5 - 5.0 g/dL   AST 45 (H) 15 - 41 U/L   ALT 15 (L) 17 - 63 U/L   Alkaline Phosphatase 104 38 - 126 U/L   Total Bilirubin 1.4 (H) 0.3 - 1.2 mg/dL   GFR calc non Af Amer >60 >60 mL/min   GFR calc Af Amer >60 >60 mL/min    Comment: (NOTE) The eGFR has been calculated using the CKD EPI equation. This calculation has not been validated in all clinical situations. eGFR's persistently <60 mL/min signify possible Chronic Kidney Disease.    Anion gap 9 5 - 15  Sodium     Status: Abnormal   Collection Time: 04/27/16  8:25 AM  Result Value Ref Range   Sodium 118 (LL) 135 - 145 mmol/L    Comment: CRITICAL RESULT CALLED TO, READ BACK BY AND VERIFIED WITH JACK DOUGHERTY AT 0925 ON 04/27/16 BYKBH   Sodium     Status: Abnormal   Collection Time: 04/27/16 11:35 AM  Result Value Ref Range   Sodium 118 (LL) 135 - 145 mmol/L    Comment: CRITICAL RESULT CALLED TO, READ BACK BY AND VERIFIED WITH LEIGH MILES AT 1214 ON 04/27/16 BY [facility]   Sodium     Status: Abnormal   Collection Time: 04/27/16  2:51 PM  Result Value Ref Range   Sodium 119 (LL) 135 - 145 mmol/L    Comment: CRITICAL RESULT CALLED TO, READ BACK BY AND VERIFIED WITH JACK DOUGHERTY AT 1531 ON 04/27/16 BY [facility]   Sodium     Status: Abnormal   Collection Time: 04/27/16  5:22 PM  Result Value Ref Range   Sodium 120 (L) 135 - 145 mmol/L  Sodium     Status: Abnormal   Collection Time: 04/27/16  8:18 PM  Result Value Ref Range   Sodium 119 (LL) 135 - 145 mmol/L     Comment: CRITICAL RESULT CALLED TO, READ BACK BY AND VERIFIED WITH TAMARA CONYERS AT 2037 ON 04/27/16 BY [facility]   Sodium     Status: Abnormal   Collection Time: 04/27/16 11:17 PM  Result Value Ref Range   Sodium 120 (L) 135 - 145 mmol/L  Sodium     Status: Abnormal   Collection Time: 04/28/16  2:47 AM  Result Value Ref Range   Sodium 119 (LL) 135 - 145 mmol/L    Comment: CRITICAL RESULT CALLED TO, READ BACK BY AND VERIFIED WITH TAMARA CONYERS AT 0932 04/28/16.PMH  Sodium     Status: Abnormal   Collection Time: 04/28/16  5:33 AM  Result Value Ref Range   Sodium 120 (L) 135 - 145 mmol/L  Sodium     Status: Abnormal   Collection Time: 04/28/16  8:38 AM  Result Value Ref Range   Sodium 123 (L) 135 - 145 mmol/L  Vancomycin, trough     Status: Abnormal   Collection Time: 04/28/16 12:53 PM  Result Value Ref Range   Vancomycin Tr 21 (HH) 10 - 20 ug/mL    Comment: CRITICAL RESULT CALLED TO, READ BACK BY AND VERIFIED WITH  HANK ZOMPA (PHARM) 04/28/16 @ 1329  MLK    No components found for: ESR, C REACTIVE PROTEIN MICRO: Recent Results (from the past 720 hour(s))  Body fluid culture     Status: None (Preliminary result)   Collection Time: 04/24/16 10:10 AM  Result Value Ref Range Status   Specimen Description FLUID PERITONEAL  Final   Special Requests NONE  Final   Gram Stain   Final    FEW WBC PRESENT,BOTH PMN AND MONONUCLEAR NO ORGANISMS SEEN    Culture   Final    FEW GROUP B STREP(S.AGALACTIAE)ISOLATED TESTING AGAINST S. AGALACTIAE NOT ROUTINELY PERFORMED DUE TO PREDICTABILITY OF AMP/PEN/VAN SUSCEPTIBILITY. CRITICAL RESULT CALLED TO, READ BACK BY AND VERIFIED WITH: J. COTRONE,RN AT 1740 ON [id number] BY Rhea Bleacher Virtually 100% of S. agalactiae (Group B) strains are susceptible to Penicillin.  For Penicillin-allergic patients, Erythromycin (85-95% sensitive) and Clindamycin (80% sensitive) are drugs of choice. Contact microbiology lab to request sensitivities if  needed within 7  days. HOLDING FOR POSSIBLE ANAEROBE Performed at [facility]    Report Status PENDING  Incomplete  Culture, blood (Routine X 2) w Reflex to ID Panel     Status: None (Preliminary result)   Collection Time: 04/26/16  2:19 PM  Result Value Ref Range Status   Specimen Description BLOOD LEFT HAND  Final   Special Requests BOTTLES DRAWN AEROBIC AND ANAEROBIC  1CC  Final   Culture NO GROWTH < 24 HOURS  Final   Report Status PENDING  Incomplete  Culture, blood (Routine X 2) w Reflex to ID Panel     Status: Abnormal (Preliminary result)   Collection Time: 04/26/16  2:19 PM  Result Value Ref Range Status   Specimen Description BLOOD RIGHT HAND  Final   Special Requests BOTTLES DRAWN AEROBIC AND ANAEROBIC  5CC  Final   Culture  Setup Time   Final    GRAM POSITIVE COCCI AEROBIC BOTTLE ONLY CRITICAL RESULT CALLED TO, READ BACK BY AND VERIFIED WITH: MATT MCBANE @ 8563 04/27/16 BY [place]    Culture (A)  Final    STAPHYLOCOCCUS AUREUS SUSCEPTIBILITIES TO FOLLOW Performed at [facility]    Report Status PENDING  Incomplete  Blood Culture ID Panel (Reflexed)     Status: Abnormal   Collection Time: 04/26/16  2:19 PM  Result Value Ref Range Status   Enterococcus species NOT DETECTED NOT DETECTED Final   Vancomycin resistance NOT DETECTED NOT DETECTED Final   Listeria monocytogenes NOT DETECTED NOT DETECTED Final   Staphylococcus species DETECTED (A) NOT DETECTED Final    Comment: CRITICAL RESULT CALLED TO, READ BACK BY AND VERIFIED WITH: Matt McBane @ [phone] 04/27/16 by [facility]    Staphylococcus aureus DETECTED (A) NOT DETECTED Final    Comment: CRITICAL RESULT CALLED TO, READ BACK BY AND VERIFIED WITH: Matt McBane @ [phone] 04/27/16 by [facility]    Methicillin resistance DETECTED (A) NOT DETECTED Final    Comment: CRITICAL RESULT CALLED TO, READ BACK BY AND VERIFIED WITH: Matt McBane @ [phone] 04/27/16 by [place]    Streptococcus species NOT DETECTED NOT DETECTED Final   Streptococcus agalactiae NOT  DETECTED NOT DETECTED Final   Streptococcus pneumoniae NOT DETECTED NOT DETECTED Final   Streptococcus pyogenes NOT DETECTED NOT DETECTED Final   Acinetobacter baumannii NOT DETECTED NOT DETECTED Final   Enterobacteriaceae species NOT DETECTED NOT DETECTED Final   Enterobacter cloacae complex NOT DETECTED NOT DETECTED Final   Escherichia coli NOT DETECTED NOT DETECTED Final  Klebsiella oxytoca NOT DETECTED NOT DETECTED Final   Klebsiella pneumoniae NOT DETECTED NOT DETECTED Final   Proteus species NOT DETECTED NOT DETECTED Final   Serratia marcescens NOT DETECTED NOT DETECTED Final   Carbapenem resistance NOT DETECTED NOT DETECTED Final   Haemophilus influenzae NOT DETECTED NOT DETECTED Final   Neisseria meningitidis NOT DETECTED NOT DETECTED Final   Pseudomonas aeruginosa NOT DETECTED NOT DETECTED Final   Candida albicans NOT DETECTED NOT DETECTED Final   Candida glabrata NOT DETECTED NOT DETECTED Final   Candida krusei NOT DETECTED NOT DETECTED Final   Candida parapsilosis NOT DETECTED NOT DETECTED Final   Candida tropicalis NOT DETECTED NOT DETECTED Final    IMAGING: US Abdomen Limited  04/28/2016  CLINICAL DATA:  70 year old male with recurrent ascites. Status post paracentesis on 04/16/2016 with removal of 9.7 L at that time. Subsequent encounter. EXAM: LIMITED ABDOMEN ULTRASOUND FOR ASCITES TECHNIQUE: Limited ultrasound survey for ascites was performed in all four abdominal quadrants. COMPARISON:  04/16/2016 and earlier FINDINGS: Imaging of the 4 quadrants of the abdomen today is performed but no ascites is identified. IMPRESSION: Negative for recurrent ascites at this time following 04/16/2016 paracentesis. Electronically Signed   By: Genevie Ann M.D.   On: 04/28/2016 10:08   US Paracentesis  04/16/2016  INDICATION: 70 year old male with a history of recurrent ascites. The patient has recently experienced a spontaneous rupture of umbilical hernia of approximately 1 month prior. He has  had surgical evaluation, and repair has been deferred at this time. He returns today re- accumulating ascites. EXAM: ULTRASOUND GUIDED  PARACENTESIS MEDICATIONS: None. COMPLICATIONS: None PROCEDURE: Informed written consent was obtained from the patient after a discussion of the risks, benefits and alternatives to treatment. A timeout was performed prior to the initiation of the procedure. Initial ultrasound scanning demonstrates a large amount of ascites within the right lower abdominal quadrant. The right lower abdomen was prepped and draped in the usual sterile fashion. 1% lidocaine with epinephrine was used for local anesthesia. Following this, a Safe-T-Centesis catheter was introduced. An ultrasound image was saved for documentation purposes. The paracentesis was performed. The catheter was removed and a dressing was applied. The patient tolerated the procedure well without immediate post procedural complication. FINDINGS: A total of approximately 9.750 of hazy yellow fluid was removed. IMPRESSION: Status post ultrasound-guided paracentesis with 9.750 L of hazy yellow fluid removed. Signed, Dulcy Fanny. Earleen Newport, DO [facility] Radiology Electronically Signed   By: Corrie Mckusick D.O.   On: 04/16/2016 16:01    Assessment:   Jeff Preston is a 70 y.o. male admitted 6/17 with weakness and Na 107. He was found to have MRSA bacteremia. 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  He has had staph aureus in his ascitic fluid in May 2017. Likely source of MRSA bacteremia is the open abd wound with drainage of ascites.  Recommendations Dc cefepime Cont vanco BCX x2 to doc clearance Check Echo Will need to have the open drainage site addressed or this is likely to continue to occur Will need 2 weeks min Vanco from date of neg [place] Thank you very much for allowing me to  participate in the care of this patient. Please call with questions.   Cheral Marker. Ola Spurr, MD

## 2016-04-28 NOTE — Consult Note (Signed)
Patient looks good, eating regular food, no new complaints.  [facility] to fix the umbilical leak.  He might benefit from a TIPS procedure as well.  Serum sodium coming up.

## 2016-04-29 ENCOUNTER — Inpatient Hospital Stay (HOSPITAL_COMMUNITY)
Admit: 2016-04-29 | Discharge: 2016-04-29 | Disposition: A | Discharge status: Home / Self Care | Payer: Medicare Other | Attending: Infectious Diseases | Admitting: Infectious Diseases

## 2016-04-29 DIAGNOSIS — I10 Essential (primary) hypertension: Secondary | ICD-10-CM

## 2016-04-29 DIAGNOSIS — A4902 Methicillin resistant Staphylococcus aureus infection, unspecified site: Secondary | ICD-10-CM

## 2016-04-29 DIAGNOSIS — G9341 Metabolic encephalopathy: Secondary | ICD-10-CM

## 2016-04-29 DIAGNOSIS — K219 Gastro-esophageal reflux disease without esophagitis: Secondary | ICD-10-CM

## 2016-04-29 DIAGNOSIS — E871 Hypo-osmolality and hyponatremia: Principal | ICD-10-CM

## 2016-04-29 DIAGNOSIS — K7031 Alcoholic cirrhosis of liver with ascites: Secondary | ICD-10-CM

## 2016-04-29 DIAGNOSIS — K659 Peritonitis, unspecified: Secondary | ICD-10-CM

## 2016-04-29 DIAGNOSIS — D6489 Other specified anemias: Secondary | ICD-10-CM

## 2016-04-29 DIAGNOSIS — Z515 Encounter for palliative care: Secondary | ICD-10-CM

## 2016-04-29 DIAGNOSIS — E876 Hypokalemia: Secondary | ICD-10-CM

## 2016-04-29 DIAGNOSIS — R509 Fever, unspecified: Secondary | ICD-10-CM

## 2016-04-29 DIAGNOSIS — Z87891 Personal history of nicotine dependence: Secondary | ICD-10-CM

## 2016-04-29 DIAGNOSIS — E8809 Other disorders of plasma-protein metabolism, not elsewhere classified: Secondary | ICD-10-CM

## 2016-04-29 LAB — ECHOCARDIOGRAM COMPLETE
E/e' ratio: 7.46
EWDT: 377 ms
FS: 30 % (ref 28–44)
HEIGHTINCHES: 69 in
IVS/LV PW RATIO, ED: 0.99
LA diam end sys: 38 mm
LA vol index: 33.4 mL/m2
LADIAMINDEX: 1.92 cm/m2
LASIZE: 38 mm
LAVOL: 66.2 mL
LAVOLA4C: 60.8 mL
LV E/e'average: 7.46
LV SIMPSON'S DISK: 59
LV e' LATERAL: 9.03 cm/s
LVDIAVOL: 72 mL (ref 62–150)
LVDIAVOLIN: 36 mL/m2
LVEEMED: 7.46
LVOT area: 3.46 cm2
LVOT diameter: 21 mm
LVSYSVOL: 30 mL (ref 21–61)
LVSYSVOLIN: 15 mL/m2
MV Dec: 377
MV pk A vel: 74.2 m/s
MV pk E vel: 67.4 m/s
Mean grad: 194 mmHg
PW: 10.2 mm — AB (ref 0.6–1.1)
Stroke v: 43 ml
TDI e' lateral: 9.03
TDI e' medial: 6.42
WEIGHTICAEL: 2899.2 [oz_av]

## 2016-04-29 LAB — CBC
HCT: 32.7 % — ABNORMAL LOW (ref 40.0–52.0)
HEMOGLOBIN: 11.2 g/dL — AB (ref 13.0–18.0)
MCH: 28 pg (ref 26.0–34.0)
MCHC: 34.4 g/dL (ref 32.0–36.0)
MCV: 81.2 fL (ref 80.0–100.0)
PLATELETS: 174 10*3/uL (ref 150–440)
RBC: 4.02 MIL/uL — AB (ref 4.40–5.90)
RDW: 17.2 % — ABNORMAL HIGH (ref 11.5–14.5)
WBC: 10.9 10*3/uL — ABNORMAL HIGH (ref 3.8–10.6)

## 2016-04-29 LAB — BASIC METABOLIC PANEL
Anion gap: 8 (ref 5–15)
BUN: 19 mg/dL (ref 6–20)
CALCIUM: 7.6 mg/dL — AB (ref 8.9–10.3)
CO2: 23 mmol/L (ref 22–32)
CREATININE: 1.2 mg/dL (ref 0.61–1.24)
Chloride: 89 mmol/L — ABNORMAL LOW (ref 101–111)
GFR calc Af Amer: >60 mL/min (ref >60)
GFR, EST NON AFRICAN AMERICAN: 60 mL/min — AB (ref >60)
Glucose, Bld: 106 mg/dL — ABNORMAL HIGH (ref 65–99)
POTASSIUM: 4 mmol/L (ref 3.5–5.1)
SODIUM: 120 mmol/L — AB (ref 135–145)

## 2016-04-29 LAB — SODIUM
SODIUM: 119 mmol/L — AB (ref 135–145)
Sodium: 118 mmol/L — CL (ref 135–145)
Sodium: 120 mmol/L — ABNORMAL LOW (ref 135–145)

## 2016-04-29 MED ORDER — TRAMADOL HCL 50 MG PO TABS
50.0000 mg | ORAL_TABLET | Freq: Four times a day (QID) | ORAL | Status: DC | PRN
  Administered 2016-04-29 – 2016-05-01 (×4): 50 mg via ORAL
  Filled 2016-04-29 (×4): qty 1

## 2016-04-29 MED ORDER — LACTULOSE 10 GM/15ML PO SOLN: 20.0000 g | Freq: Two times a day (BID) | ORAL | Status: DC | PRN

## 2016-04-29 MED ORDER — SODIUM CHLORIDE 0.9 % IV SOLN
INTRAVENOUS | Status: DC
  Administered 2016-04-29 – 2016-04-30 (×2): via INTRAVENOUS

## 2016-04-29 MED ORDER — SODIUM CHLORIDE 1 G PO TABS
1.0000 g | ORAL_TABLET | Freq: Three times a day (TID) | ORAL | Status: DC
  Administered 2016-04-29 – 2016-05-01 (×7): 1 g via ORAL
  Filled 2016-04-29 (×9): qty 1

## 2016-04-29 MED ORDER — SODIUM CHLORIDE 0.9 % IV SOLN: INTRAVENOUS | Status: DC

## 2016-04-29 NOTE — Consult Note (Addendum)
Pharmacy Antibiotic Note  Jeff Preston is a 70 y.o. male admitted on 04/26/2016 with strep from ascitis secondary peritonitis Pharmacy has been consulted for vancomycin dosing.  Cultures discussed with Dr. Sampson GoonFitzgerald, BCID and final sensitivity results from 6/17 blood cultures with conflicting results. BCID: methicillin resistance detected, however sensitivities reporting methicillin sensitive. I have [facility] lab and [facility]. Both re-read tests and report the same results. [facility] repeating the sensitivities to confirm methicillin sensitive. At this time, will continue vancomycin until results confirmed. If results confirm methicillin sensitive, it is OK per Dr.Fitzgerald to change to cefazolin. [facility] should have repeat sensitivities available tomorrow (6/21)  Plan: Current orders for vancomycin 1gm IV Q18H.  Creatinine up today. Spoke to RN who will wait for trough results before hanging next dose. Trough prior to dose tonight, will not be quite steady state.   Height: 5\' 9"  (175.3 cm) Weight: 181 lb 3.2 oz (82.192 kg) IBW/kg (Calculated) : 70.7  Temp (24hrs), Avg:97.7 F (36.5 C), Min:97.3 F (36.3 C), Max:98.1 F (36.7 C)   Recent Labs Lab 04/26/16 1211 04/26/16 1417 04/26/16 1745 04/27/16 0512 04/28/16 1253 04/29/16 0557  WBC 15.9*  --   --   --   --  10.9*  CREATININE 1.07  --   --  1.02  --  1.20  LATICACIDVEN  --  1.0 1.0  --   --   --   VANCOTROUGH  --   --   --   --  21*  --     Estimated Creatinine Clearance: 58.1 mL/min (by C-G formula based on Cr of 1.2).    Allergies  Allergen Reactions  . Dilantin [Phenytoin Sodium Extended] Rash  . Penicillins Rash and Other (See Comments)    Has patient had a PCN reaction causing immediate rash, facial/tongue/throat swelling, SOB or lightheadedness with hypotension: No Has patient had a PCN reaction causing severe rash involving mucus membranes or skin necrosis: No Has patient had  a PCN reaction that required hospitalization No Has patient had a PCN reaction occurring within the last 10 years: No If all of the above answers are "NO", then may proceed with Cephalosporin use.    Antimicrobials this admission: vancomycin 6/17 >>  cefepime 6/17 >>   Dose adjustments this admission: 6/19 Vanc trough= 21. Adjusted Vanc from 1 gram IV q12h to q18h.  Microbiology results: 6/17 [id number]:Staph Aureus, MecA +on biofire; final result: [facility] - [facility] micro repeating sensitivities 6/15 fluid peritoneal few group b strep- Strep agalactiae; holding for possible anaerobe  Thank you for allowing pharmacy to be a part of this patient's care.  Garlon HatchetJody Violet Cart, PharmD Clinical Pharmacist   04/29/2016  1:45 PM

## 2016-04-29 NOTE — Clinical Social Work Note (Signed)
Clinical Social Work Assessment  Patient Details  Name: Jeff Preston MRN: [id number] Date of Birth: 16-Dec-1945  Date of referral:  04/29/16               Reason for consult:  Discharge Planning                Permission sought to share information with:  Family Supports Permission granted to share information::  Yes, Verbal Permission Granted  Name::        Agency::     Relationship::   ([place] - Sister )  Sport and exercise psychologist Information:     Housing/Transportation Living arrangements for the past 2 months:  [place] of Information:  Patient Patient Interpreter Needed:  None Criminal Activity/Legal Involvement Pertinent to Current Situation/Hospitalization:  No - Comment as needed Significant Relationships:  Other Family Members Lives with:  Self Do you feel safe going back to the place where you live?  Yes Need for family participation in patient care:  Yes (Comment) ([place] - Sister )  Care giving concerns:  Patient will need SNF placement for IV/ABX.    Social Worker assessment / plan:  CSW met with patient and his family at bedside. CSW introduced herself and her role. Per patient he feels he'll need assistance with his IV/ ABX stated that he cannot do it himself. Reported that he wanted to go home but feels that it's best that he has assistance. Agreeable to SNF search in [facility]. Granted CSW to contact his sister if needed. Patient reports that he's unfamiliar with SNFs in the area. CSW encouraged him to look facilities up on [email or web address] FL2 completed. PASRR pending. CSW will continue to follow and assist.   Employment status:  Retired Forensic scientist:  Medicare PT Recommendations:  [place] / Referral to community resources:  [place]  Patient/Family's Response to care:  Patient is in agreement for SNF placement to assist with IV/ ABX  Patient/Family's Understanding of and Emotional Response to  Diagnosis, Current Treatment, and Prognosis:  Patient is appreciative of CSW's assistance and understands that he will need assistance with his IV/ABX.   Emotional Assessment Appearance:  Appears stated age Attitude/Demeanor/Rapport:   (None) Affect (typically observed):  Calm, Pleasant Orientation:  Oriented to Self, Oriented to Place, Oriented to  Time, Oriented to Situation Alcohol / Substance use:  Not Applicable Psych involvement (Current and /or in the community):  No (Comment)  Discharge Needs  Concerns to be addressed:  Discharge Planning Concerns Readmission within the last 30 days:  No Current discharge risk:  Chronically ill Barriers to Discharge:  Continued Medical Work up   [employer], LCSW 04/29/2016, 4:26 PM

## 2016-04-29 NOTE — NC FL2 (Signed)
[place] MEDICAID FL2 LEVEL OF CARE SCREENING TOOL     IDENTIFICATION  Patient Name: Jeff Preston Birthdate: [birth year] Sex: male Admission Date (Current Location): 04/26/2016  [place] and [place] Number:  Chiropodist and Address:  [facility], [address], [place], [place] [address]      Provider Number: [id number]  Attending Physician Name and Address:  Shaune Pollack, MD  Relative Name and Phone Number:       Current Level of Care: Hospital Recommended Level of Care: Skilled Nursing Facility Prior Approval Number:    Date Approved/Denied:   PASRR Number:   [id number]  Discharge Plan: SNF    Current Diagnoses: Patient Active Problem List   Diagnosis Date Noted  . Hepatic cirrhosis (HCC) 04/28/2016  . Hyponatremia 04/26/2016  . Umbilical hernia with gangrene   . Open wound of umbilical region   . Ascites 03/12/2016    Orientation RESPIRATION BLADDER Height & Weight     Self, Time, Situation, Place  Normal Continent Weight: 181 lb 3.2 oz (82.192 kg) Height:   (175.3 cm)  BEHAVIORAL SYMPTOMS/MOOD NEUROLOGICAL BOWEL NUTRITION STATUS   (None)  (None) Continent Diet (Regular)  AMBULATORY STATUS COMMUNICATION OF NEEDS Skin   Extensive Assist Verbally Normal                       Personal Care Assistance Level of Assistance  Bathing, Feeding, Dressing Bathing Assistance: Limited assistance Feeding assistance: Independent Dressing Assistance: Limited assistance     Functional Limitations Info  Hearing, Sight, Speech Sight Info: Impaired Hearing Info: Adequate Speech Info: Adequate    SPECIAL CARE FACTORS FREQUENCY  PT (By licensed PT)     PT Frequency:  (5)              Contractures      Additional Factors Info  Code Status, Allergies, Isolation Precautions Code Status Info:  (DNR) Allergies Info:  (Dilantin & Penicillins)     Isolation Precautions Info:  (Contact Precautions)     Current  Medications (04/29/2016):  This is the current hospital active medication list Current Facility-Administered Medications  Medication Dose Route Frequency Provider Last Rate Last Dose  . 0.9 %  sodium chloride infusion   Intravenous Continuous Shaune Pollack, MD 50 mL/hr at 04/29/16 1045    . albuterol (PROVENTIL) (2.5 MG/3ML) 0.083% nebulizer solution 2.5 mg  2.5 mg Nebulization Q2H PRN Srikar Sudini, MD      . chlorpheniramine-HYDROcodone (TUSSIONEX) 10-8 MG/5ML suspension 5 mL  5 mL Oral Q12H PRN Arnaldo Natal, MD   5 mL at 04/29/16 0125  . enoxaparin (LOVENOX) injection 40 mg  40 mg Subcutaneous Q24H Shaune Pollack, MD   40 mg at 04/28/16 1805  . lactulose (CHRONULAC) 10 GM/15ML solution 20 g  20 g Oral BID PRN Suan Halter, MD      . LORazepam (ATIVAN) tablet 0.5 mg  0.5 mg Oral TID Houston Siren, MD   0.5 mg at 04/29/16 0950  . nadolol (CORGARD) tablet 20 mg  20 mg Oral Daily Shaune Pollack, MD   20 mg at 04/29/16 0950  . ofloxacin (OCUFLOX) 0.3 % ophthalmic solution 1 drop  1 drop Left Eye QID Shaune Pollack, MD   1 drop at 04/29/16 1407  . ondansetron (ZOFRAN) tablet 4 mg  4 mg Oral Q6H PRN Srikar Sudini, MD       Or  . ondansetron (ZOFRAN) injection 4 mg  4  mg Intravenous Q6H PRN Srikar Sudini, MD      . pantoprazole (PROTONIX) EC tablet 40 mg  40 mg Oral Daily Shaune PollackQing Chen, MD   40 mg at 04/29/16 0950  . simethicone (MYLICON) chewable tablet 160-240 mg  160-240 mg Oral Q6H PRN Shaune PollackQing Chen, MD      . sodium chloride tablet 1 g  1 g Oral TID WC Lamont DowdySarath Kolluru, MD   1 g at 04/29/16 1122  . traMADol (ULTRAM) tablet 50 mg  50 mg Oral Q6H PRN Scot Junobert T [name], MD      . vancomycin [facility](VANCOCIN) IVPB 1000 mg/200 mL premix  1,000 mg Intravenous Q18H Leafy Roiego F Pabon, MD   1,000 mg at 04/29/16 0608  . zolpidem (AMBIEN) tablet 10 mg  10 mg Oral QHS PRN Shaune PollackQing Chen, MD   10 mg at 04/28/16 2307     Discharge Medications: Please see discharge summary for a list of discharge medications.  Relevant Imaging  Results:  Relevant Lab Results:   Additional Information  (SSN [id number])  Verta Ellenhristina E Honorio Devol, LCSW

## 2016-04-29 NOTE — Progress Notes (Signed)
[facility] [place] [facility]   PATIENT NAME: Jeff Preston    MR#:  [id number]  DATE OF BIRTH:  1946/09/06  SUBJECTIVE:  CHIEF COMPLAINT:   Chief Complaint  Patient presents with  . hernia leaking fluid   . Weakness   Generalized weakness REVIEW OF SYSTEMS:  CONSTITUTIONAL: No fever,Generalized weakness.  EYES: No blurred or double vision.  EARS, NOSE, AND THROAT: No tinnitus or ear pain.  RESPIRATORY: No cough, shortness of breath, wheezing or hemoptysis.  CARDIOVASCULAR: No chest pain, orthopnea, edema.  GASTROINTESTINAL: No nausea, vomiting, diarrhea or abdominal pain.  GENITOURINARY: No dysuria, hematuria.  ENDOCRINE: No polyuria, nocturia,  HEMATOLOGY: No anemia, easy bruising or bleeding SKIN: No rash or lesion. MUSCULOSKELETAL: No joint pain or arthritis.   NEUROLOGIC: No tingling, numbness, weakness.  PSYCHIATRY: No anxiety or depression.   DRUG ALLERGIES:   Allergies  Allergen Reactions  . Dilantin [Phenytoin Sodium Extended] Rash  . Penicillins Rash and Other (See Comments)    Has patient had a PCN reaction causing immediate rash, facial/tongue/throat swelling, SOB or lightheadedness with hypotension: No Has patient had a PCN reaction causing severe rash involving mucus membranes or skin necrosis: No Has patient had a PCN reaction that required hospitalization No Has patient had a PCN reaction occurring within the last 10 years: No If all of the above answers are "NO", then may proceed with Cephalosporin use.    VITALS:  Blood pressure 110/55, pulse 59, temperature 97.6 F (36.4 C), temperature source Oral, resp. rate 15, height  (1.753 m), weight 181 lb 3.2 oz (82.192 kg), SpO2 100 %.  PHYSICAL EXAMINATION:  GENERAL:  70 y.o.-year-old patient lying in the bed with no acute distress.  EYES: Pupils equal, round, reactive to light and accommodation. No scleral icterus. Extraocular muscles intact.  HEENT: Head atraumatic,  normocephalic. Oropharynx and nasopharynx clear.  NECK:  Supple, no jugular venous distention. No thyroid enlargement, no tenderness.  LUNGS: Normal breath sounds bilaterally, no wheezing, rales,rhonchi or crepitation. No use of accessory muscles of respiration.  CARDIOVASCULAR: S1, S2 normal. No murmurs, rubs, or gallops.  ABDOMEN: Soft, nontender, nondistended. Bowel sounds present. No organomegaly or mass.  EXTREMITIES: No pedal edema, cyanosis, or clubbing.  NEUROLOGIC: Cranial nerves II through XII are intact. Muscle strength 5/5 in all extremities. Sensation intact. Gait not checked.  PSYCHIATRIC: The patient is alert and oriented x 3.  SKIN: No obvious rash, lesion, or ulcer.    LABORATORY PANEL:   CBC  Recent Labs Lab 04/29/16 0557  WBC 10.9*  HGB 11.2*  HCT 32.7*  PLT 174   ------------------------------------------------------------------------------------------------------------------  Chemistries   Recent Labs Lab 04/27/16 0512  04/29/16 0557 04/29/16 0858  NA 115*  < > 120* 119*  K 3.9  --  4.0  --   CL 83*  --  89*  --   CO2 23  --  23  --   GLUCOSE 93  --  106*  --   BUN 12  --  19  --   CREATININE 1.02  --  1.20  --   CALCIUM 7.7*  --  7.6*  --   AST 45*  --   --   --   ALT 15*  --   --   --   ALKPHOS 104  --   --   --   BILITOT 1.4*  --   --   --   < > = values in this interval not  displayed. ------------------------------------------------------------------------------------------------------------------  Cardiac Enzymes No results for input(s): TROPONINI in the last 168 hours. ------------------------------------------------------------------------------------------------------------------  RADIOLOGY:  [place] Abdomen Limited  04/28/2016  CLINICAL DATA:  70 year old male with recurrent ascites. Status post paracentesis on 04/16/2016 with removal of 9.7 L at that time. Subsequent encounter. EXAM: LIMITED ABDOMEN ULTRASOUND FOR ASCITES TECHNIQUE:  Limited ultrasound survey for ascites was performed in all four abdominal quadrants. COMPARISON:  04/16/2016 and earlier FINDINGS: Imaging of the 4 quadrants of the abdomen today is performed but no ascites is identified. IMPRESSION: Negative for recurrent ascites at this time following 04/16/2016 paracentesis. Electronically Signed   By: Odessa FlemingH  Hall M.D.   On: 04/28/2016 10:08    EKG:   Orders placed or performed during the hospital encounter of 04/26/16  . ED EKG  . ED EKG    ASSESSMENT AND PLAN:   * Acute on chronic hyponatremia, Baseline Na 125-130. Likely due to dehydration and poor oral intake. Improving with normal saline at 50 ML per hour. Sodium level increased to 118, normal saline was discontinued and started half-normal saline per Dr. Cherylann RatelLateef.  Na 119 today, change to NS iv and Follow-up sodium level.  * Group B streptococcus SBP; bacteremia (MRSA ), fluid cutlure: STAPHYLOCOCCUS AUREUS. He was treated with ceftriaxone. Patient has penicillin allergy. on vancomycin and cefepime,  Per Dr. Sampson GoonFitzgerald, ID consult,  Dc cefepime, Cont vanco BCX x2 to doc clearance Check Echo Will need to have the open drainage site addressed or this is likely to continue to occur Will need 2 weeks min Vanco from date of neg BCX  * Cirrhosis with chronic ascites Negative for recurrent ascites per US. Continue nadolol and hold spironolactone due to hyponatremia.  *Umbilical hernia Per Dr. Orvis BrillLoflin,  Given his cirrhosis he is at high risk for any operative intervention, with that being said would recommend that a draining umbilical hernia have medical optimization and then operative intervention at a center that can manage liver patient's and has the appropriate blood products available such as [facility] or [facility]. No need for emergent operation. Surgery will go on standby.  Per Dr. Orvan Falconerampbell, no palliative care/hospice care this time. I discussed with Dr. Wynelle LinkKolluru and Dr. Orvan Falconerampbell. All the records are  reviewed and case discussed with Care Management/Social Workerr. Management plans discussed with the patient, family and they are in agreement.  CODE STATUS: DO NOT RESUSCITATE  TOTAL TIME TAKING CARE OF THIS PATIENT: 38 minutes.  Greater than 50% time was spent on coordination of care and face-to-face counseling.  POSSIBLE D/C IN 2-3 DAYS, DEPENDING ON CLINICAL CONDITION.   Shaune Pollackhen, Kerrington Greenhalgh M.D on 04/29/2016 at 1:16 PM  Between 7am to 6pm - Pager - [phone]  After 6pm go to [email or web address] - password [email or web address] [facility]  [facility]  Office  [phone]  CC: Primary care physician; Lynnae PrudeELLIOTT, [name], MD

## 2016-04-29 NOTE — Progress Notes (Signed)
Pt.'s hernia leaked a total of 750 ml in the last twelve hours. Drainage was yellow in appearance with sediment present. Colostomy is dry and intact.   Karsten RoLauren E Hobbs

## 2016-04-29 NOTE — Progress Notes (Signed)
Dr. Imogene Burnhen notified of critical sodium of 118. No new orders.

## 2016-04-29 NOTE — Progress Notes (Signed)
CSW submitted PASRR for patient. PASRR is pending. CSW will continue to follow and assist.  Woodroe Modehristina Lindell Renfrew, MSW, LCSW-A, LCAS-A Clinical Social Worker [phone]

## 2016-04-29 NOTE — Consult Note (Signed)
Patient complains of pain in right abd/back,  Ibuprofen not good for cirrhotic liver disease so will switch to tramadol for now.  Pt has serious problem which will really not get better unless surgery is done.  He is at somewhat high risk for surgery.  Agree very much with plans for SNF and iv treatment via PIC line.  [facility].

## 2016-04-29 NOTE — Progress Notes (Signed)
[facility] CLINIC INFECTIOUS DISEASE PROGRESS NOTE Date of Admission:  04/26/2016     ID: Jeff Preston is a 70 y.o. male with Staph aureus bacteremia from ascitic fluid source with open draining area of ascitic fluid Active Problems:   Hyponatremia   Umbilical hernia with gangrene   Hepatic cirrhosis (HCC)   Subjective: No fevers, no new complaints  ROS  Eleven systems are reviewed and negative except per hpi  Medications:  Antibiotics Given (last 72 hours)    Date/Time Action Medication Dose Rate   04/26/16 1823 Given   vancomycin (VANCOCIN) 1,250 mg in sodium chloride 0.9 % 250 mL IVPB 1,250 mg 166.7 mL/hr   04/26/16 2112 Given   ceFEPIme (MAXIPIME) 2 g in dextrose 5 % 50 mL IVPB 2 g 100 mL/hr   04/27/16 0045 Given   vancomycin (VANCOCIN) IVPB 1000 mg/200 mL premix 1,000 mg 200 mL/hr   04/27/16 1224 Given   ceFEPIme (MAXIPIME) 2 g in dextrose 5 % 50 mL IVPB 2 g 100 mL/hr   04/27/16 1350 Given   vancomycin (VANCOCIN) IVPB 1000 mg/200 mL premix 1,000 mg 200 mL/hr   04/27/16 2204 Given   ceFEPIme (MAXIPIME) 2 g in dextrose 5 % 50 mL IVPB 2 g 100 mL/hr   04/27/16 2351 Given   vancomycin (VANCOCIN) IVPB 1000 mg/200 mL premix 1,000 mg 200 mL/hr   04/28/16 1134 Given   ceFEPIme (MAXIPIME) 2 g in dextrose 5 % 50 mL IVPB 2 g 100 mL/hr   04/28/16 1300 Given   vancomycin (VANCOCIN) IVPB 1000 mg/200 mL premix 1,000 mg 200 mL/hr   04/29/16 0608 Given   vancomycin (VANCOCIN) IVPB 1000 mg/200 mL premix 1,000 mg 200 mL/hr     . enoxaparin (LOVENOX) injection  40 mg Subcutaneous Q24H  . LORazepam  0.5 mg Oral TID  . nadolol  20 mg Oral Daily  . ofloxacin  1 drop Left Eye QID  . pantoprazole  40 mg Oral Daily  . sodium chloride  1 g Oral TID WC  . vancomycin  1,000 mg Intravenous Q18H    Objective: Vital signs in last 24 hours: Temp:  [97.3 F (36.3 C)-98.1 F (36.7 C)] 97.6 F (36.4 C) (06/20 1206) Pulse Rate:  [59-67] 59 (06/20 1206) Resp:  [15-18] 15 (06/20 1206) BP:  (110-130)/(55-67) 110/55 mmHg (06/20 1206) SpO2:  [99 %-100 %] 100 % (06/20 1206) Weight:  [82.192 kg (181 lb 3.2 oz)] 82.192 kg (181 lb 3.2 oz) (06/20 0500) Constitutional: He is oriented to person, place, and time. Slowed mentation, dishelved, chronically ill apperaing HENT: anictric Mouth/Throat: Oropharynx is clear and dry . No oropharyngeal exudate.  Cardiovascular: Normal rate, regular rhythm and normal heart sounds. Pulmonary/Chest: Effort normal and breath sounds normal. No respiratory distress. He has no wheezes.  Abdominal: Soft. midl distention, lower abd with a drainage site with colostomy bag over it. There is some maceration and raw skine at the site.  ymphadenopathy: He has no cervical adenopathy.  Neurological: He is alert but slowed mentation  Skin: Skin is warm and dry. No rash noted. No erythema.  Psychiatric: He has a normal mood and affect. His behavior is normal.   Lab Results  Recent Labs  04/27/16 0512  04/29/16 0557 04/29/16 0858  WBC  --   --  10.9*  --   HGB  --   --  11.2*  --   HCT  --   --  32.7*  --   NA 115*  < >  120* 119*  K 3.9  --  4.0  --   CL 83*  --  89*  --   CO2 23  --  23  --   BUN 12  --  19  --   CREATININE 1.02  --  1.20  --   < > = values in this interval not displayed.  Microbiology: Results for orders placed or performed during the hospital encounter of 04/26/16  Culture, blood (Routine X 2) w Reflex to ID Panel     Status: None (Preliminary result)   Collection Time: 04/26/16  2:19 PM  Result Value Ref Range Status   Specimen Description BLOOD LEFT HAND  Final   Special Requests BOTTLES DRAWN AEROBIC AND ANAEROBIC  1CC  Final   Culture NO GROWTH 3 DAYS  Final   Report Status PENDING  Incomplete  Culture, blood (Routine X 2) w Reflex to ID Panel     Status: Abnormal   Collection Time: 04/26/16  2:19 PM  Result Value Ref Range Status   Specimen Description BLOOD RIGHT HAND  Final   Special Requests BOTTLES DRAWN AEROBIC  AND ANAEROBIC  5CC  Final   Culture  Setup Time   Final    GRAM POSITIVE COCCI AEROBIC BOTTLE ONLY CRITICAL RESULT CALLED TO, READ BACK BY AND VERIFIED WITH: MATT MCBANE @ 1610 04/27/16 BY [facility]    Culture STAPHYLOCOCCUS AUREUS (A)  Final   Report Status 04/29/2016 FINAL  Final   Organism ID, Bacteria STAPHYLOCOCCUS AUREUS  Final      Susceptibility   Staphylococcus aureus - MIC*    CIPROFLOXACIN <=0.5 SENSITIVE Sensitive     ERYTHROMYCIN <=0.25 SENSITIVE Sensitive     GENTAMICIN <=0.5 SENSITIVE Sensitive     OXACILLIN <=0.25 SENSITIVE Sensitive     TETRACYCLINE <=1 SENSITIVE Sensitive     VANCOMYCIN 1 SENSITIVE Sensitive     TRIMETH/SULFA <=10 SENSITIVE Sensitive     CLINDAMYCIN <=0.25 SENSITIVE Sensitive     RIFAMPIN <=0.5 SENSITIVE Sensitive     Inducible Clindamycin NEGATIVE Sensitive     * STAPHYLOCOCCUS AUREUS  Blood Culture ID Panel (Reflexed)     Status: Abnormal   Collection Time: 04/26/16  2:19 PM  Result Value Ref Range Status   Enterococcus species NOT DETECTED NOT DETECTED Final   Vancomycin resistance NOT DETECTED NOT DETECTED Final   Listeria monocytogenes NOT DETECTED NOT DETECTED Final   Staphylococcus species DETECTED (A) NOT DETECTED Final    Comment: CRITICAL RESULT CALLED TO, READ BACK BY AND VERIFIED WITH: Matt McBane @ [phone] 04/27/16 by [facility]    Staphylococcus aureus DETECTED (A) NOT DETECTED Final    Comment: CRITICAL RESULT CALLED TO, READ BACK BY AND VERIFIED WITH: Matt McBane @ [phone] 04/27/16 by [facility]    Methicillin resistance DETECTED (A) NOT DETECTED Final    Comment: CRITICAL RESULT CALLED TO, READ BACK BY AND VERIFIED WITH: Matt McBane @ [phone] 04/27/16 by [facility]    Streptococcus species NOT DETECTED NOT DETECTED Final   Streptococcus agalactiae NOT DETECTED NOT DETECTED Final   Streptococcus pneumoniae NOT DETECTED NOT DETECTED Final   Streptococcus pyogenes NOT DETECTED NOT DETECTED Final   Acinetobacter baumannii NOT DETECTED NOT DETECTED Final    Enterobacteriaceae species NOT DETECTED NOT DETECTED Final   Enterobacter cloacae complex NOT DETECTED NOT DETECTED Final   Escherichia coli NOT DETECTED NOT DETECTED Final   Klebsiella oxytoca NOT DETECTED NOT DETECTED Final   Klebsiella pneumoniae NOT DETECTED NOT DETECTED Final  Proteus species NOT DETECTED NOT DETECTED Final   Serratia marcescens NOT DETECTED NOT DETECTED Final   Carbapenem resistance NOT DETECTED NOT DETECTED Final   Haemophilus influenzae NOT DETECTED NOT DETECTED Final   Neisseria meningitidis NOT DETECTED NOT DETECTED Final   Pseudomonas aeruginosa NOT DETECTED NOT DETECTED Final   Candida albicans NOT DETECTED NOT DETECTED Final   Candida glabrata NOT DETECTED NOT DETECTED Final   Candida krusei NOT DETECTED NOT DETECTED Final   Candida parapsilosis NOT DETECTED NOT DETECTED Final   Candida tropicalis NOT DETECTED NOT DETECTED Final  Culture, blood (Routine X 2) w Reflex to ID Panel     Status: None (Preliminary result)   Collection Time: 04/28/16  3:56 PM  Result Value Ref Range Status   Specimen Description BLOOD RT WRIST  Final   Special Requests BOTTLES DRAWN AEROBIC AND ANAEROBIC 6CC  Final   Culture NO GROWTH < 12 HOURS  Final   Report Status PENDING  Incomplete  Culture, blood (Routine X 2) w Reflex to ID Panel     Status: None (Preliminary result)   Collection Time: 04/28/16  5:53 PM  Result Value Ref Range Status   Specimen Description BLOOD RT H  Final   Special Requests BOTTLES DRAWN AEROBIC AND ANAEROBIC 10CC  Final   Culture NO GROWTH < 12 HOURS  Final   Report Status PENDING  Incomplete    Studies/Results: Koreas Abdomen Limited  04/28/2016  CLINICAL DATA:  70 year old male with recurrent ascites. Status post paracentesis on 04/16/2016 with removal of 9.7 L at that time. Subsequent encounter. EXAM: LIMITED ABDOMEN ULTRASOUND FOR ASCITES TECHNIQUE: Limited ultrasound survey for ascites was performed in all four abdominal quadrants. COMPARISON:   04/16/2016 and earlier FINDINGS: Imaging of the 4 quadrants of the abdomen today is performed but no ascites is identified. IMPRESSION: Negative for recurrent ascites at this time following 04/16/2016 paracentesis. Electronically Signed   By: Odessa FlemingH  Hall M.D.   On: 04/28/2016 10:08    Assessment/Plan: Jeff Preston is a 70 y.o. male admitted 6/17 with weakness and Na 107. He was found to have MRSA bacteremia. 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  He has had staph aureus in his ascitic fluid in May 2017. Likely source of MRSA bacteremia is the open abd wound with drainage of ascites. There is discrepancy between [employer] and routine culture whether MRSA or MSSA Recommendationse Cont vanco pending final repeat sensis BCX x2 to doc clearance pending from June 19th  Pending Echo Will need to have the open drainage site addressed or this is likely to continue to occur - can consider suppressive therapy if not able to be addressd Will need 2 weeks min Vanco from date of neg BCX Will need picc placed once fu bcx negative Thank you very much for the consult. Will follow with you.  FITZGERALD, DAVID P   04/29/2016, 2:55 PM

## 2016-04-29 NOTE — Care Management (Signed)
Patient admitted with Acute on chronic hyponatremia.  Patient lives at home with his son.  Patient's sister at bedside during assessment.  Patient previously discharged in may of 2017.  At that time Hospice services at home were arranged with Hospice with [facility].  However, patient decline services at discharge.  Patient states that at base line he does not use any medical equipment for ambulation. Patient's sister provides transportation.    Patient will require IV antibiotics at home at the time of discharge. Patient sister states that son works long hours and "rarely is at home".  Sister states "I have a farm to tend to and cannot commit to help giving him his medicine".  I explained to the patient that home health nursing would not come out daily to administer antibiotics.  Patient also has a leaking hernia, that is contained with an ostomy back.  Significant output.  Palliative consult has been placed.   Recommendation for patient to receive IV antibiotics as SNF. CSW notified.  Patient is agreeable for bedsearch.  PT consult pending. RNCM following.

## 2016-04-29 NOTE — Progress Notes (Signed)
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  Patient pleasant this AM and states he feels much better. He had paracentesis yesterday.  He is eating without any issues. He denies pain in the abdomen this AM.    Filed Vitals:   04/29/16 0448 04/29/16 1206  BP: 120/67 110/55  Pulse: 67 59  Temp: 97.3 F (36.3 C) 97.6 F (36.4 C)  Resp: 18 15   I/O last 3 completed shifts: In: 492 [I.V.:492] Out: 4150 [Urine:3650; Other:500] Total I/O In: -  Out: 550 [Urine:550]   PE:  Gen: NAD Res: CTAB/L  Cardio: RRR Abd: soft, fluid wave, umbilical hernia with serous drainage no erythema or purulence Ext: 2+ pulses, no edema   CBC Latest Ref Rng 04/29/2016 04/26/2016 03/16/2016  WBC 3.8 - 10.6 K/uL 10.9(H) 15.9(H) 7.6  Hemoglobin 13.0 - 18.0 g/dL 11.2(L) 12.4(L) 11.9(L)  Hematocrit 40.0 - 52.0 % 32.7(L) 35.5(L) 35.0(L)  Platelets 150 - 440 K/uL 174 297 266    CMP Latest Ref Rng 04/29/2016 04/29/2016 04/28/2016  Glucose 65 - 99 mg/dL - 045(W106(H) -  BUN 6 - 20 mg/dL - 19 -  Creatinine 0.980.61 - 1.24 mg/dL - 1.191.20 -  Sodium 147135 - 145 mmol/L 119(LL) 120(L) 123(L)  Potassium 3.5 - 5.1 mmol/L - 4.0 -  Chloride 101 - 111 mmol/L - 89(L) -  CO2 22 - 32 mmol/L - 23 -  Calcium 8.9 - 10.3 mg/dL - 7.6(L) -  Total Protein 6.5 - 8.1 g/dL - - -  Total Bilirubin 0.3 - 1.2 mg/dL - - -  Alkaline Phos 38 - 126 U/L - - -  AST 15 - 41 U/L - - -  ALT 17 - 63 U/L - - -    A/p: 70 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 Given his cirrhosis he is at high risk for any operative intervention, with that being said would recommend that a draining umbilical hernia have medical optimization and then operative intervention at a center that can manage liver patient's and has the appropriate blood products available such as [facility] or [facility].  No need for emergent operation. Surgery will go on standby, please let us know if we can be of further assistance.

## 2016-04-29 NOTE — Progress Notes (Deleted)
Echocardiogram 2D Echocardiogram has been performed.  Dorothey BasemanReel, Daveon Arpino M 04/29/2016, 12:03 PM

## 2016-04-29 NOTE — Progress Notes (Signed)
Echocardiogram 2D Echocardiogram has been performed.  Lizanne Erker M 04/29/2016, 12:03 PM 

## 2016-04-29 NOTE — Progress Notes (Signed)
Dr. Imogene Burnhen notified verbally in person of critical sodium of 119.

## 2016-04-29 NOTE — Consult Note (Signed)
Palliative Medicine Inpatient Consult Note   Name: Jeff Preston Date: 04/29/2016 MRN: [id number]  DOB: 12/25/1945  Referring Physician: Shaune PollackQing Chen, MD  Palliative Care consult requested for this 70 y.o. 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  DISCUSSIONS AND PLANS: I have reviewed the notes and recommendations of surgeon and gastroenterologist and have also spoken with pt, his attending, Dr. Imogene Burnhen, and pt's nurse, as well as care manager.  Pt does not need emergent hospital-[facility]-hospital [facility] but an early outpatient referral is being recommended.  He is being stabilized and then he could have an outpatient referral to one of these facilities.  That is the aggressive version of what may happen if all goes optimally for this very sick man.    To be stabilized, he needs to not have an active serious infection and he needs to be maintaining his sodium (at least above 125 I would think). His sodium was 125- 131 in early May of this year, but he has not gotten up that high yet.  He is mentating well today with a sodium of 119.  To treat his MRSA bacteremia, he will need two weeks of Vanco. His sister has informed care manager that she cannot accept any responsibility for administering Vancomycin at pt's home. Also, pts son is not available all the time so he cannot do this. Care Mgr has spoken with pt about going to a skilled facility and he would be open to this plan.  I asked him also about this, and he would be fine with this if it would help him recover from this illness.   I brought up the fact that he is very ill and he could have the option of being under the care of Hospice. They could come to his home --but it would not be for the purpose of giving him antibiotics, but rather for the purpose of making sure he stays comfortable during his last days of  life.  I mentioned the difference between Hospice in the home and at [facility] and described these a bit to him.  He asked several appropriate questions that let me know he is not as closed to the concept of Hospice as he was during his last hospital stay.   After he thought for a while, he said he would like to try to stay alive for a while longer and is not quite ready for Hospice.  He wants to be given [facility] and he wants the two weeks of ABX via IV.  It is my opinion that pt is not the best person to be in charge of his own Vanco infusions at home --simply b/c his sodium level could drop again and then his mentation would not be good enough for safe infusions of Vanco in the home setting.    I have updated the Care Mgr and have informed his attending that he is not ready for hospice, but seemed to be willing to choose this at some point in the future.  Because of this, I would recommend SNF for short term for rehab and Vanco infusions WITH PALLIATIVE CARE CONSULT IN THE FACILITY.  The latter is important as they could help pt transition to hospice when that time does come for him.  He should have someone set up a referral app[facility]  or [facility] --so he won't have to wait too long for an appointment.   He doesn't have that kind of time left most likely.  We might want to check an APF as this might help [place] prognosticate life expectancy further.   As far as symptom management goes:   ---I would limit bowel regimen to lactulose as it will work to eliminate any component of hepatic encephalopathy as well as providing pt with regular bowel movements.  NO BMs are charted since admission.  Ammonia was 14 (wnl) on 7/16.  ---He may have varices so would limit NSAID use (he got one dose late last pm).    ---Tussionex can probably be changed to Robitussin at some point soon.   At this time, I will sign off--but can be called back at anytime felt to be appropriate.      -------------------------------------------------------------------------------------------------------------   PROBLEMS:  Metabolic Encephalopathy ---at high risk also for Hepatic Encephalopathy ---better with sodium correcting.   Hyponatremia due to draining of large volumes of fluid and electrolytes from peritoneum and also due to the presence of ascites due to cirrhosis  ---now has nephrology consult and NS running.  ---Sodium up to 119 MRSA Bacteremia  ---bld cxs positive on 6/17 ---repeat bld cxs negative after IV ABX on 6/19 ---ID has been consulted and states that pt will need 2 weeks of Vanco IV (from 6/19). Acute Bacterial Peritonitis  ---multiple organisms  ---large volume paracentesis was done on 6/7.   History of recent Bacterial Peritonitis with staph Alcoholic cirrhosis (total bilirubin is 1.7, Cr is 0.80, INR is 1.48, sodium 123-131 now) ----Pts MELD score is 11. (was 18 in May).   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 Hypertension   . Depression   . GERD (gastroesophageal reflux disease)   . Cirrhosis (HCC)   . Anxiety   . Umbilical hernia   . Ascites   . Chronic hyponatremia   . DNR (do not resuscitate)     PAST SURGICAL HISTORY:  Past Surgical History  Procedure Laterality Date  . Throat surgery    . Cholecystectomy      SOCIAL HISTORY:  reports that he has quit smoking. He does not have any smokeless tobacco history on file. He reports that he does not drink alcohol or use illicit drugs.    LEGAL DOCUMENTS:  I completed a portable DNR form and placed it in the paper chart.   CODE STATUS: DNR   REVIEW OF SYSTEMS:  He denies pain or nausea or vomiting. He is eating  currently.  Says he is better.  Not coughing.  No chills or fever now.  His Umbilical Hernia is draining into a pouch (large volumes).   All other systems were reviewed and found to be negative  SPIRITUAL SUPPORT SYSTEM: He has a sister, but she is unable to be a full time caregiver to pt. He has a son, but he is not always available .  ALLERGIES:  is allergic to dilantin and penicillins.  MEDICATIONS:  Current Facility-Administered Medications  Medication Dose Route Frequency Provider Last Rate Last Dose  . 0.9 %  sodium chloride infusion  250 mL Intravenous PRN Milagros Loll, MD      . 0.9 %  sodium chloride infusion   Intravenous Continuous Shaune Pollack, MD 50 mL/hr at 04/29/16 1045    . albuterol (PROVENTIL) (2.5 MG/3ML) 0.083% nebulizer solution 2.5 mg  2.5 mg Nebulization Q2H PRN Srikar Sudini, MD      . bisacodyl (DULCOLAX) EC tablet 5 mg  5 mg Oral Daily PRN Shaune Pollack, MD      . chlorpheniramine-HYDROcodone (TUSSIONEX) 10-8 MG/5ML suspension 5 mL  5 mL Oral Q12H PRN Arnaldo Natal, MD   5 mL at 04/29/16 0125  . docusate sodium (COLACE) capsule 100 mg  100 mg Oral BID Milagros Loll, MD   100 mg at 04/29/16 0950  . enoxaparin (LOVENOX) injection 40 mg  40 mg Subcutaneous Q24H Shaune Pollack, MD   40 mg at 04/28/16 1805  . ibuprofen (ADVIL,MOTRIN) tablet 200-400 mg  200-400 mg Oral Q6H PRN Shaune Pollack, MD   400 mg at 04/28/16 2306  . LORazepam (ATIVAN) tablet 0.5 mg  0.5 mg Oral TID Houston Siren, MD   0.5 mg at 04/29/16 0950  . nadolol (CORGARD) tablet 20 mg  20 mg Oral Daily Shaune Pollack, MD   20 mg at 04/29/16 0950  . ofloxacin (OCUFLOX) 0.3 % ophthalmic solution 1 drop  1 drop Left Eye QID Shaune Pollack, MD   1 drop at 04/29/16 0950  . ondansetron (ZOFRAN) tablet 4 mg  4 mg Oral Q6H PRN Milagros Loll, MD       Or  . ondansetron (ZOFRAN) injection 4 mg  4 mg Intravenous Q6H PRN Srikar Sudini, MD      . pantoprazole (PROTONIX) EC tablet 40 mg  40 mg Oral Daily Shaune Pollack, MD   40 mg at 04/29/16  0950  . polyethylene glycol (MIRALAX / GLYCOLAX) packet 17 g  17 g Oral Daily PRN Milagros Loll, MD      . simethicone (MYLICON) chewable tablet 160-240 mg  160-240 mg Oral Q6H PRN Shaune Pollack, MD      . sodium chloride flush (NS) 0.9 % injection 3 mL  3 mL Intravenous Q12H Srikar Sudini, MD   3 mL at 04/27/16 1000  . sodium chloride flush (NS) 0.9 % injection 3 mL  3 mL Intravenous Q12H Milagros Loll, MD   3 mL at 04/27/16 2211  . sodium chloride flush (NS) 0.9 % injection 3 mL  3 mL Intravenous PRN Srikar Sudini, MD      . sodium chloride tablet 1 g  1 g Oral TID WC Lamont Dowdy, MD   1 g at 04/29/16 1122  . vancomycin (VANCOCIN) IVPB 1000 mg/200 mL premix  1,000 mg Intravenous Q18H Leafy Ro, MD   1,000 mg at 04/29/16 1610  . zolpidem (AMBIEN) tablet 10 mg  10 mg Oral QHS PRN Shaune Pollack, MD   10 mg at 04/28/16 2307    Vital Signs: BP 110/55 mmHg  Pulse 59  Temp(Src) 97.6 F (36.4 C) (Oral)  Resp 15  Ht 5\' 9"  (1.753 m)  Wt 82.192 kg (181 lb 3.2 oz)  BMI 26.75 kg/m2  SpO2 100% Filed Weights   04/27/16 0500 04/28/16 0541 04/29/16 0500  Weight: 81.784 kg (180 lb 4.8 oz) 81.557 kg (179 lb 12.8 oz) 82.192 kg (181 lb 3.2 oz)    Estimated body mass index is 26.75 kg/(m^2) as calculated from the following:   Height as of this encounter: 5\' 9"  (1.753 m).   Weight as of this encounter: 82.192 kg (181 lb 3.2 oz).  PERFORMANCE STATUS (ECOG) :  3 - Symptomatic, >50% confined to bed  PHYSICAL EXAM: Pt is alert and oriented today. He speaks slowly -but he is also thinking about his answers apparently --and answers having put some thought into what he is saying. EOMI OP clear No JVD or TM Hrt rrr no mgr Abd --Ruptured Umbilical Hernia site is dressed and has an ostomy collection bag (though this is draining peritoneal fluid and not a true ostomy) Ext no cyanosis or mottling of skin LABS: CBC:    Component Value Date/Time   WBC 10.9* 04/29/2016 0557   WBC 8.7 03/05/2015 0110   HGB  11.2* 04/29/2016 0557   HGB 10.8* 03/05/2015 0110   HCT 32.7* 04/29/2016 0557   HCT 31.9* 03/05/2015 0110   PLT 174 04/29/2016 0557   PLT 151 03/05/2015 0110   MCV 81.2 04/29/2016 0557   MCV 84 03/05/2015 0110   NEUTROABS 5.5 03/12/2016 1852   NEUTROABS 6.5 03/05/2015 0110   LYMPHSABS 0.6* 03/12/2016 1852   LYMPHSABS 1.0 03/05/2015 0110   MONOABS 1.0 03/12/2016 1852   MONOABS 0.9 03/05/2015 0110   EOSABS 0.0 03/12/2016 1852   EOSABS 0.2 03/05/2015 0110   BASOSABS 0.0 03/12/2016 1852   BASOSABS 0 03/05/2015 1215   BASOSABS 0.0 03/05/2015 0110   Comprehensive Metabolic Panel:    Component Value Date/Time   NA 119* 04/29/2016 0858   NA 134* 03/05/2015 0110   K 4.0 04/29/2016 0557   K 3.8 03/05/2015 0110   CL 89* 04/29/2016 0557   CL 98* 03/05/2015 0110   CO2 23 04/29/2016 0557   CO2 26 03/05/2015 0110   BUN 19 04/29/2016 0557   BUN 7 03/05/2015 0110   CREATININE 1.20 04/29/2016 0557   CREATININE 1.04 03/05/2015 0110   GLUCOSE 106* 04/29/2016 0557   GLUCOSE 96 03/05/2015 0110   CALCIUM 7.6* 04/29/2016 0557   CALCIUM 8.0* 03/05/2015 0110   AST 45* 04/27/2016 0512   AST 61* 03/05/2015 0110   ALT 15* 04/27/2016 0512   ALT 22 03/05/2015 0110   ALKPHOS 104 04/27/2016 0512   ALKPHOS 108 03/05/2015 0110   BILITOT 1.4* 04/27/2016 0512   BILITOT 1.9* 03/05/2015 0110   PROT 6.0* 04/27/2016 0512   PROT 7.4 03/05/2015 0110   ALBUMIN 2.7* 04/27/2016 0512   ALBUMIN 3.0* 03/05/2015 0110     More than 50% of the visit was spent in counseling/coordination of care: Yes  Time Spent: 80 minutes

## 2016-04-29 NOTE — Progress Notes (Signed)
[facility]  ROUNDING NOTE   Subjective:   Na 119   Objective:  Vital signs in last 24 hours:  Temp:  [97.3 F (36.3 C)-98.1 F (36.7 C)] 97.3 F (36.3 C) (06/20 0448) Pulse Rate:  [64-67] 67 (06/20 0448) Resp:  [18] 18 (06/20 0448) BP: (103-130)/(56-67) 120/67 mmHg (06/20 0448) SpO2:  [99 %-100 %] 99 % (06/20 0448) Weight:  [82.192 kg (181 lb 3.2 oz)] 82.192 kg (181 lb 3.2 oz) (06/20 0500)  Weight change: 0.635 kg (1 lb 6.4 oz) Filed Weights   04/27/16 0500 04/28/16 0541 04/29/16 0500  Weight: 81.784 kg (180 lb 4.8 oz) 81.557 kg (179 lb 12.8 oz) 82.192 kg (181 lb 3.2 oz)    Intake/Output: I/O last 3 completed shifts: In: 492 [I.V.:492] Out: 4150 [Urine:3650; Other:500]   Intake/Output this shift:  Total I/O In: -  Out: 550 [Urine:550]  Physical Exam: General: NAD, resting in bed  Head: Normocephalic, atraumatic. Moist oral mucosal membranes  Eyes: Anicteric  Neck: Supple, trachea midline  Lungs:  Clear to auscultation, normal effort  Heart: S1S2 no rubs  Abdomen:  Umbilical hernia with ostomy bag  Extremities: No peripheral edema.  Neurologic: Slow to answer questions, but awake and alert  Skin: No lesions       Basic Metabolic Panel:  Recent Labs Lab 04/26/16 1211  04/27/16 0512  04/28/16 0247 04/28/16 0533 04/28/16 0838 04/29/16 0557 04/29/16 0858  NA 107*  < > 115*  < > 119* 120* 123* 120* 119*  K 4.0  --  3.9  --   --   --   --  4.0  --   CL 71*  --  83*  --   --   --   --  89*  --   CO2 23  --  23  --   --   --   --  23  --   GLUCOSE 112*  --  93  --   --   --   --  106*  --   BUN 11  --  12  --   --   --   --  19  --   CREATININE 1.07  --  1.02  --   --   --   --  1.20  --   CALCIUM 8.1*  --  7.7*  --   --   --   --  7.6*  --   < > = values in this interval not displayed.  Liver Function Tests:  Recent Labs Lab 04/26/16 1211 04/26/16 1745 04/27/16 0512  AST 47* 45* 45*  ALT 17 16* 15*  ALKPHOS 121 115 104  BILITOT  2.1* 1.5* 1.4*  PROT 6.7 6.2* 6.0*  ALBUMIN 3.0* 2.7* 2.7*   No results for input(s): LIPASE, AMYLASE in the last 168 hours. No results for input(s): AMMONIA in the last 168 hours.  CBC:  Recent Labs Lab 04/26/16 1211 04/29/16 0557  WBC 15.9* 10.9*  HGB 12.4* 11.2*  HCT 35.5* 32.7*  MCV 77.0* 81.2  PLT 297 174    Cardiac Enzymes: No results for input(s): CKTOTAL, CKMB, CKMBINDEX, TROPONINI in the last 168 hours.  BNP: Invalid input(s): POCBNP  CBG: No results for input(s): GLUCAP in the last 168 hours.  Microbiology: Results for orders placed or performed during the hospital encounter of 04/26/16  Culture, blood (Routine X 2) w Reflex to ID Panel     Status: None (Preliminary result)   Collection  Time: 04/26/16  2:19 PM  Result Value Ref Range Status   Specimen Description BLOOD LEFT HAND  Final   Special Requests BOTTLES DRAWN AEROBIC AND ANAEROBIC  1CC  Final   Culture NO GROWTH 3 DAYS  Final   Report Status PENDING  Incomplete  Culture, blood (Routine X 2) w Reflex to ID Panel     Status: Abnormal   Collection Time: 04/26/16  2:19 PM  Result Value Ref Range Status   Specimen Description BLOOD RIGHT HAND  Final   Special Requests BOTTLES DRAWN AEROBIC AND ANAEROBIC  5CC  Final   Culture  Setup Time   Final    GRAM POSITIVE COCCI AEROBIC BOTTLE ONLY CRITICAL RESULT CALLED TO, READ BACK BY AND VERIFIED WITH: MATT MCBANE @ 1610 04/27/16 BY [facility]    Culture STAPHYLOCOCCUS AUREUS (A)  Final   Report Status 04/29/2016 FINAL  Final   Organism ID, Bacteria STAPHYLOCOCCUS AUREUS  Final      Susceptibility   Staphylococcus aureus - MIC*    CIPROFLOXACIN <=0.5 SENSITIVE Sensitive     ERYTHROMYCIN <=0.25 SENSITIVE Sensitive     GENTAMICIN <=0.5 SENSITIVE Sensitive     OXACILLIN <=0.25 SENSITIVE Sensitive     TETRACYCLINE <=1 SENSITIVE Sensitive     VANCOMYCIN 1 SENSITIVE Sensitive     TRIMETH/SULFA <=10 SENSITIVE Sensitive     CLINDAMYCIN <=0.25 SENSITIVE Sensitive      RIFAMPIN <=0.5 SENSITIVE Sensitive     Inducible Clindamycin NEGATIVE Sensitive     * STAPHYLOCOCCUS AUREUS  Blood Culture ID Panel (Reflexed)     Status: Abnormal   Collection Time: 04/26/16  2:19 PM  Result Value Ref Range Status   Enterococcus species NOT DETECTED NOT DETECTED Final   Vancomycin resistance NOT DETECTED NOT DETECTED Final   Listeria monocytogenes NOT DETECTED NOT DETECTED Final   Staphylococcus species DETECTED (A) NOT DETECTED Final    Comment: CRITICAL RESULT CALLED TO, READ BACK BY AND VERIFIED WITH: Matt McBane @ [phone] 04/27/16 by [facility]    Staphylococcus aureus DETECTED (A) NOT DETECTED Final    Comment: CRITICAL RESULT CALLED TO, READ BACK BY AND VERIFIED WITH: Matt McBane @ [phone] 04/27/16 by [facility]    Methicillin resistance DETECTED (A) NOT DETECTED Final    Comment: CRITICAL RESULT CALLED TO, READ BACK BY AND VERIFIED WITH: Matt McBane @ [phone] 04/27/16 by [facility]    Streptococcus species NOT DETECTED NOT DETECTED Final   Streptococcus agalactiae NOT DETECTED NOT DETECTED Final   Streptococcus pneumoniae NOT DETECTED NOT DETECTED Final   Streptococcus pyogenes NOT DETECTED NOT DETECTED Final   Acinetobacter baumannii NOT DETECTED NOT DETECTED Final   Enterobacteriaceae species NOT DETECTED NOT DETECTED Final   Enterobacter cloacae complex NOT DETECTED NOT DETECTED Final   Escherichia coli NOT DETECTED NOT DETECTED Final   Klebsiella oxytoca NOT DETECTED NOT DETECTED Final   Klebsiella pneumoniae NOT DETECTED NOT DETECTED Final   Proteus species NOT DETECTED NOT DETECTED Final   Serratia marcescens NOT DETECTED NOT DETECTED Final   Carbapenem resistance NOT DETECTED NOT DETECTED Final   Haemophilus influenzae NOT DETECTED NOT DETECTED Final   Neisseria meningitidis NOT DETECTED NOT DETECTED Final   Pseudomonas aeruginosa NOT DETECTED NOT DETECTED Final   Candida albicans NOT DETECTED NOT DETECTED Final   Candida glabrata NOT DETECTED NOT DETECTED Final   Candida  krusei NOT DETECTED NOT DETECTED Final   Candida parapsilosis NOT DETECTED NOT DETECTED Final   Candida tropicalis NOT DETECTED NOT DETECTED Final  Culture, blood (Routine X 2) w Reflex to ID Panel     Status: None (Preliminary result)   Collection Time: 04/28/16  3:56 PM  Result Value Ref Range Status   Specimen Description BLOOD RT WRIST  Final   Special Requests BOTTLES DRAWN AEROBIC AND ANAEROBIC 6CC  Final   Culture NO GROWTH < 12 HOURS  Final   Report Status PENDING  Incomplete  Culture, blood (Routine X 2) w Reflex to ID Panel     Status: None (Preliminary result)   Collection Time: 04/28/16  5:53 PM  Result Value Ref Range Status   Specimen Description BLOOD RT H  Final   Special Requests BOTTLES DRAWN AEROBIC AND ANAEROBIC 10CC  Final   Culture NO GROWTH < 12 HOURS  Final   Report Status PENDING  Incomplete    Coagulation Studies:  Recent Labs  04/26/16 1211 04/26/16 1745  LABPROT 15.2* 15.5*  INR 1.18 1.21    Urinalysis:  Recent Labs  04/26/16 1417  COLORURINE YELLOW*  LABSPEC 1.004*  PHURINE 6.0  GLUCOSEU NEGATIVE  HGBUR 1+*  BILIRUBINUR NEGATIVE  KETONESUR NEGATIVE  PROTEINUR NEGATIVE  NITRITE NEGATIVE  LEUKOCYTESUR NEGATIVE      Imaging: [place] Abdomen Limited  04/28/2016  CLINICAL DATA:  70 year old male with recurrent ascites. Status post paracentesis on 04/16/2016 with removal of 9.7 L at that time. Subsequent encounter. EXAM: LIMITED ABDOMEN ULTRASOUND FOR ASCITES TECHNIQUE: Limited ultrasound survey for ascites was performed in all four abdominal quadrants. COMPARISON:  04/16/2016 and earlier FINDINGS: Imaging of the 4 quadrants of the abdomen today is performed but no ascites is identified. IMPRESSION: Negative for recurrent ascites at this time following 04/16/2016 paracentesis. Electronically Signed   By: Odessa FlemingH  Hall M.D.   On: 04/28/2016 10:08     Medications:   . sodium chloride 50 mL/hr at 04/29/16 1045   . docusate sodium  100 mg Oral BID  .  enoxaparin (LOVENOX) injection  40 mg Subcutaneous Q24H  . LORazepam  0.5 mg Oral TID  . nadolol  20 mg Oral Daily  . ofloxacin  1 drop Left Eye QID  . pantoprazole  40 mg Oral Daily  . sodium chloride flush  3 mL Intravenous Q12H  . sodium chloride flush  3 mL Intravenous Q12H  . sodium chloride  1 g Oral TID WC  . vancomycin  1,000 mg Intravenous Q18H   sodium chloride, albuterol, bisacodyl, chlorpheniramine-HYDROcodone, ibuprofen, ondansetron **OR** ondansetron (ZOFRAN) IV, polyethylene glycol, simethicone, sodium chloride flush, zolpidem  Assessment/ Plan:  70 y.o. 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   1. Acute on chronic hyponatremia: Baseline Na 125-130 - Restart IV NS - Continue to monitor.   2. Acute peritonitis K65.0: multiple organisms - empiric cefepime and vancomycin.  - status post large volume paracentesis on 6/7.   3. 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   4. Umbilical Hernia: leaking. Followed by Surgery   LOS: 3 Jeff Preston 6/20/201711:11 AM

## 2016-04-30 ENCOUNTER — Inpatient Hospital Stay: Payer: Medicare Other

## 2016-04-30 LAB — BASIC METABOLIC PANEL
ANION GAP: 6 (ref 5–15)
BUN: 17 mg/dL (ref 6–20)
CALCIUM: 7.7 mg/dL — AB (ref 8.9–10.3)
CO2: 24 mmol/L (ref 22–32)
CREATININE: 1.11 mg/dL (ref 0.61–1.24)
Chloride: 94 mmol/L — ABNORMAL LOW (ref 101–111)
Glucose, Bld: 99 mg/dL (ref 65–99)
Potassium: 4.5 mmol/L (ref 3.5–5.1)
SODIUM: 124 mmol/L — AB (ref 135–145)

## 2016-04-30 LAB — CULTURE, BLOOD (ROUTINE X 2)

## 2016-04-30 LAB — MAGNESIUM: MAGNESIUM: 2.2 mg/dL (ref 1.7–2.4)

## 2016-04-30 LAB — BODY FLUID CULTURE

## 2016-04-30 LAB — SODIUM
SODIUM: 122 mmol/L — AB (ref 135–145)
SODIUM: 126 mmol/L — AB (ref 135–145)
Sodium: 127 mmol/L — ABNORMAL LOW (ref 135–145)

## 2016-04-30 MED ORDER — LORATADINE 10 MG PO TABS
10.0000 mg | ORAL_TABLET | Freq: Every day | ORAL | Status: DC
  Administered 2016-04-30 – 2016-05-01 (×2): 10 mg via ORAL
  Filled 2016-04-30 (×2): qty 1

## 2016-04-30 MED ORDER — FUROSEMIDE 40 MG PO TABS
40.0000 mg | ORAL_TABLET | Freq: Every day | ORAL | Status: DC
  Administered 2016-04-30 – 2016-05-01 (×2): 40 mg via ORAL
  Filled 2016-04-30 (×2): qty 1

## 2016-04-30 MED ORDER — CEFAZOLIN SODIUM-DEXTROSE 2-4 GM/100ML-% IV SOLN
2.0000 g | Freq: Three times a day (TID) | INTRAVENOUS | Status: DC
  Administered 2016-04-30 – 2016-05-01 (×3): 2 g via INTRAVENOUS
  Filled 2016-04-30 (×7): qty 100

## 2016-04-30 NOTE — Progress Notes (Signed)
[facility]  ROUNDING NOTE   Subjective:   Na 122 On sodium chloride IV and PO   Objective:  Vital signs in last 24 hours:  Temp:  [97.6 F (36.4 C)-97.9 F (36.6 C)] 97.9 F (36.6 C) (06/21 0800) Pulse Rate:  [59-73] 72 (06/21 0800) Resp:  [12-16] 16 (06/21 0800) BP: (110-129)/(55-61) 129/57 mmHg (06/21 0800) SpO2:  [99 %-100 %] 99 % (06/21 0800) Weight:  [81.965 kg (180 lb 11.2 oz)] 81.965 kg (180 lb 11.2 oz) (06/21 0502)  Weight change: -0.227 kg (-8 oz) Filed Weights   04/28/16 0541 04/29/16 0500 04/30/16 0502  Weight: 81.557 kg (179 lb 12.8 oz) 82.192 kg (181 lb 3.2 oz) 81.965 kg (180 lb 11.2 oz)    Intake/Output: I/O last 3 completed shifts: In: 2112 [P.O.:120; I.V.:1592; IV Piggyback:400] Out: 3850 [Urine:3850]   Intake/Output this shift:  Total I/O In: 240 [P.O.:240] Out: 600 [Urine:600]  Physical Exam: General: NAD, resting in bed  Head: Normocephalic, atraumatic. Moist oral mucosal membranes  Eyes: Anicteric  Neck: Supple, trachea midline  Lungs:  Clear to auscultation, normal effort  Heart: S1S2 no rubs  Abdomen:  Umbilical hernia with ostomy bag  Extremities: No peripheral edema.  Neurologic: Slow to answer questions, but awake and alert  Skin: No lesions       Basic Metabolic Panel:  Recent Labs Lab 04/26/16 1211  04/27/16 0512  04/29/16 0557 04/29/16 0858 04/29/16 1426 04/29/16 2032 04/30/16 0435 04/30/16 0836  NA 107*  < > 115*  < > 120* 119* 118* 120* 124* 122*  K 4.0  --  3.9  --  4.0  --   --   --  4.5  --   CL 71*  --  83*  --  89*  --   --   --  94*  --   CO2 23  --  23  --  23  --   --   --  24  --   GLUCOSE 112*  --  93  --  106*  --   --   --  99  --   BUN 11  --  12  --  19  --   --   --  17  --   CREATININE 1.07  --  1.02  --  1.20  --   --   --  1.11  --   CALCIUM 8.1*  --  7.7*  --  7.6*  --   --   --  7.7*  --   MG  --   --   --   --   --   --   --   --  2.2  --   < > = values in this interval not  displayed.  Liver Function Tests:  Recent Labs Lab 04/26/16 1211 04/26/16 1745 04/27/16 0512  AST 47* 45* 45*  ALT 17 16* 15*  ALKPHOS 121 115 104  BILITOT 2.1* 1.5* 1.4*  PROT 6.7 6.2* 6.0*  ALBUMIN 3.0* 2.7* 2.7*   No results for input(s): LIPASE, AMYLASE in the last 168 hours. No results for input(s): AMMONIA in the last 168 hours.  CBC:  Recent Labs Lab 04/26/16 1211 04/29/16 0557  WBC 15.9* 10.9*  HGB 12.4* 11.2*  HCT 35.5* 32.7*  MCV 77.0* 81.2  PLT 297 174    Cardiac Enzymes: No results for input(s): CKTOTAL, CKMB, CKMBINDEX, TROPONINI in the last 168 hours.  BNP: Invalid input(s): POCBNP  CBG:  No results for input(s): GLUCAP in the last 168 hours.  Microbiology: Results for orders placed or performed during the hospital encounter of 04/26/16  Culture, blood (Routine X 2) w Reflex to ID Panel     Status: None (Preliminary result)   Collection Time: 04/26/16  2:19 PM  Result Value Ref Range Status   Specimen Description BLOOD LEFT HAND  Final   Special Requests BOTTLES DRAWN AEROBIC AND ANAEROBIC  1CC  Final   Culture NO GROWTH 3 DAYS  Final   Report Status PENDING  Incomplete  Culture, blood (Routine X 2) w Reflex to ID Panel     Status: Abnormal   Collection Time: 04/26/16  2:19 PM  Result Value Ref Range Status   Specimen Description BLOOD RIGHT HAND  Final   Special Requests BOTTLES DRAWN AEROBIC AND ANAEROBIC  5CC  Final   Culture  Setup Time   Final    GRAM POSITIVE COCCI AEROBIC BOTTLE ONLY CRITICAL RESULT CALLED TO, READ BACK BY AND VERIFIED WITH: MATT MCBANE @ 1610 04/27/16 BY [facility]    Culture STAPHYLOCOCCUS AUREUS (A)  Final   Report Status 04/29/2016 FINAL  Final   Organism ID, Bacteria STAPHYLOCOCCUS AUREUS  Final      Susceptibility   Staphylococcus aureus - MIC*    CIPROFLOXACIN <=0.5 SENSITIVE Sensitive     ERYTHROMYCIN <=0.25 SENSITIVE Sensitive     GENTAMICIN <=0.5 SENSITIVE Sensitive     OXACILLIN <=0.25 SENSITIVE Sensitive      TETRACYCLINE <=1 SENSITIVE Sensitive     VANCOMYCIN 1 SENSITIVE Sensitive     TRIMETH/SULFA <=10 SENSITIVE Sensitive     CLINDAMYCIN <=0.25 SENSITIVE Sensitive     RIFAMPIN <=0.5 SENSITIVE Sensitive     Inducible Clindamycin NEGATIVE Sensitive     * STAPHYLOCOCCUS AUREUS  Blood Culture ID Panel (Reflexed)     Status: Abnormal   Collection Time: 04/26/16  2:19 PM  Result Value Ref Range Status   Enterococcus species NOT DETECTED NOT DETECTED Final   Vancomycin resistance NOT DETECTED NOT DETECTED Final   Listeria monocytogenes NOT DETECTED NOT DETECTED Final   Staphylococcus species DETECTED (A) NOT DETECTED Final    Comment: CRITICAL RESULT CALLED TO, READ BACK BY AND VERIFIED WITH: Matt McBane @ [phone] 04/27/16 by [facility]    Staphylococcus aureus DETECTED (A) NOT DETECTED Final    Comment: CRITICAL RESULT CALLED TO, READ BACK BY AND VERIFIED WITH: Matt McBane @ [phone] 04/27/16 by [facility]    Methicillin resistance DETECTED (A) NOT DETECTED Final    Comment: CRITICAL RESULT CALLED TO, READ BACK BY AND VERIFIED WITH: Matt McBane @ [phone] 04/27/16 by [facility]    Streptococcus species NOT DETECTED NOT DETECTED Final   Streptococcus agalactiae NOT DETECTED NOT DETECTED Final   Streptococcus pneumoniae NOT DETECTED NOT DETECTED Final   Streptococcus pyogenes NOT DETECTED NOT DETECTED Final   Acinetobacter baumannii NOT DETECTED NOT DETECTED Final   Enterobacteriaceae species NOT DETECTED NOT DETECTED Final   Enterobacter cloacae complex NOT DETECTED NOT DETECTED Final   Escherichia coli NOT DETECTED NOT DETECTED Final   Klebsiella oxytoca NOT DETECTED NOT DETECTED Final   Klebsiella pneumoniae NOT DETECTED NOT DETECTED Final   Proteus species NOT DETECTED NOT DETECTED Final   Serratia marcescens NOT DETECTED NOT DETECTED Final   Carbapenem resistance NOT DETECTED NOT DETECTED Final   Haemophilus influenzae NOT DETECTED NOT DETECTED Final   Neisseria meningitidis NOT DETECTED NOT DETECTED Final    Pseudomonas aeruginosa NOT DETECTED NOT DETECTED Final  Candida albicans NOT DETECTED NOT DETECTED Final   Candida glabrata NOT DETECTED NOT DETECTED Final   Candida krusei NOT DETECTED NOT DETECTED Final   Candida parapsilosis NOT DETECTED NOT DETECTED Final   Candida tropicalis NOT DETECTED NOT DETECTED Final  Culture, blood (Routine X 2) w Reflex to ID Panel     Status: None (Preliminary result)   Collection Time: 04/28/16  3:56 PM  Result Value Ref Range Status   Specimen Description BLOOD RT WRIST  Final   Special Requests BOTTLES DRAWN AEROBIC AND ANAEROBIC 6CC  Final   Culture NO GROWTH < 24 HOURS  Final   Report Status PENDING  Incomplete  Culture, blood (Routine X 2) w Reflex to ID Panel     Status: None (Preliminary result)   Collection Time: 04/28/16  5:53 PM  Result Value Ref Range Status   Specimen Description BLOOD RT H  Final   Special Requests BOTTLES DRAWN AEROBIC AND ANAEROBIC 10CC  Final   Culture NO GROWTH < 24 HOURS  Final   Report Status PENDING  Incomplete    Coagulation Studies: No results for input(s): LABPROT, INR in the last 72 hours.  Urinalysis: No results for input(s): COLORURINE, LABSPEC, PHURINE, GLUCOSEU, HGBUR, BILIRUBINUR, KETONESUR, PROTEINUR, UROBILINOGEN, NITRITE, LEUKOCYTESUR in the last 72 hours.  Invalid input(s): APPERANCEUR    Imaging: No results found.   Medications:   . sodium chloride 50 mL/hr at 04/30/16 1035   . enoxaparin (LOVENOX) injection  40 mg Subcutaneous Q24H  . furosemide  40 mg Oral Daily  . LORazepam  0.5 mg Oral TID  . nadolol  20 mg Oral Daily  . ofloxacin  1 drop Left Eye QID  . pantoprazole  40 mg Oral Daily  . sodium chloride  1 g Oral TID WC  . vancomycin  1,000 mg Intravenous Q18H   albuterol, chlorpheniramine-HYDROcodone, lactulose, ondansetron **OR** ondansetron (ZOFRAN) IV, simethicone, traMADol, zolpidem  Assessment/ Plan:  70 y.o. 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   1. Acute on chronic hyponatremia: Baseline Na 125-130 - PO sodium chloride.  - Continue to monitor.   2. Acute peritonitis K65.0: multiple organisms - IV vanco: appreciate ID input - status post large volume paracentesis on 6/7.   3. 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   4. Umbilical Hernia: leaking. Followed by Surgery   LOS: 4 Carlota Philley 6/21/201711:07 AM

## 2016-04-30 NOTE — NC FL2 (Signed)
[place] MEDICAID FL2 LEVEL OF CARE SCREENING TOOL     IDENTIFICATION  Patient Name: Jeff Preston Birthdate: Aug 21, 1946 Sex: male Admission Date (Current Location): 04/26/2016  [facility] [place] [place]   Facility and Address:  [facility], [address], [place], [place] [address]      Provider Number: [id number]  Attending Physician Name and Address:  Milagros LollSrikar Sudini, MD  Relative Name and Phone Number:       Current Level of Care: Hospital Recommended Level of Care: Skilled Nursing Facility Prior Approval Number:    Date Approved/Denied:   PASRR Number:    Discharge Plan: SNF    Current Diagnoses: Patient Active Problem List   Diagnosis Date Noted  . Hepatic cirrhosis (HCC) 04/28/2016  . Hyponatremia 04/26/2016  . Umbilical hernia with gangrene   . Open wound of umbilical region   . Ascites 03/12/2016    Orientation RESPIRATION BLADDER Height & Weight     Self, Time, Situation, Place  Normal Continent Weight: 180 lb 11.2 oz (81.965 kg) Height:  5\' 9"  (175.3 cm)  BEHAVIORAL SYMPTOMS/MOOD NEUROLOGICAL BOWEL NUTRITION STATUS   (None)  (None) Continent Diet (Regular)  AMBULATORY STATUS COMMUNICATION OF NEEDS Skin   Extensive Assist Verbally Normal                       Personal Care Assistance Level of Assistance  Bathing, Feeding, Dressing Bathing Assistance: Limited assistance Feeding assistance: Independent Dressing Assistance: Limited assistance     Functional Limitations Info  Hearing, Sight, Speech Sight Info: Impaired Hearing Info: Adequate Speech Info: Adequate    SPECIAL CARE FACTORS FREQUENCY  PT (By licensed PT)     PT Frequency:  (5)              Contractures      Additional Factors Info  Code Status, Allergies, Isolation Precautions Code Status Info:  (DNR) Allergies Info:  (Dilantin & Penicillins)     Isolation Precautions Info:  (Contact Precautions)     Current  Medications (04/30/2016):  This is the current hospital active medication list Current Facility-Administered Medications  Medication Dose Route Frequency Provider Last Rate Last Dose  . 0.9 %  sodium chloride infusion   Intravenous Continuous Shaune PollackQing Chen, MD 50 mL/hr at 04/30/16 1035    . albuterol (PROVENTIL) (2.5 MG/3ML) 0.083% nebulizer solution 2.5 mg  2.5 mg Nebulization Q2H PRN Srikar Sudini, MD      . chlorpheniramine-HYDROcodone (TUSSIONEX) 10-8 MG/5ML suspension 5 mL  5 mL Oral Q12H PRN Arnaldo NatalMichael S Diamond, MD   5 mL at 04/29/16 0125  . enoxaparin (LOVENOX) injection 40 mg  40 mg Subcutaneous Q24H Shaune PollackQing Chen, MD   40 mg at 04/29/16 1730  . furosemide (LASIX) tablet 40 mg  40 mg Oral Daily Srikar Sudini, MD      . lactulose (CHRONULAC) 10 GM/15ML solution 20 g  20 g Oral BID PRN Suan HalterMargaret F Campbell, MD      . LORazepam (ATIVAN) tablet 0.5 mg  0.5 mg Oral TID Houston SirenVivek J Sainani, MD   0.5 mg at 04/29/16 2200  . nadolol (CORGARD) tablet 20 mg  20 mg Oral Daily Shaune PollackQing Chen, MD   20 mg at 04/30/16 1034  . ofloxacin (OCUFLOX) 0.3 % ophthalmic solution 1 drop  1 drop Left Eye QID Shaune PollackQing Chen, MD   1 drop at 04/30/16 1035  . ondansetron (ZOFRAN) tablet 4 mg  4 mg Oral Q6H PRN Srikar  Sudini, MD       Or  . ondansetron (ZOFRAN) injection 4 mg  4 mg Intravenous Q6H PRN Srikar Sudini, MD      . pantoprazole (PROTONIX) EC tablet 40 mg  40 mg Oral Daily Shaune Pollack, MD   40 mg at 04/30/16 1034  . simethicone (MYLICON) chewable tablet 160-240 mg  160-240 mg Oral Q6H PRN Shaune Pollack, MD      . sodium chloride tablet 1 g  1 g Oral TID WC Lamont Dowdy, MD   1 g at 04/30/16 0836  . traMADol (ULTRAM) tablet 50 mg  50 mg Oral Q6H PRN Scot Jun, MD   50 mg at 04/29/16 2321  . vancomycin (VANCOCIN) IVPB 1000 mg/200 mL premix  1,000 mg Intravenous Q18H Leafy Ro, MD   1,000 mg at 04/30/16 0018  . zolpidem (AMBIEN) tablet 10 mg  10 mg Oral QHS PRN Shaune Pollack, MD   10 mg at 04/29/16 2321     Discharge  Medications: Please see discharge summary for a list of discharge medications.  Relevant Imaging Results:  Relevant Lab Results:   Additional Information  (SSN [id number])  York Spaniel, LCSW

## 2016-04-30 NOTE — Care Management Important Message (Signed)
Important Message  Patient Details  Name: Jeff BreslowDouglas W Labarre MRN: [id number] Date of Birth: November 11, 1945   Medicare Important Message Given:  Yes    Olegario MessierKathy A Kristyna Bradstreet 04/30/2016, 2:11 PM

## 2016-04-30 NOTE — Clinical Social Work Note (Signed)
CSW has spoken to patient and family member and patient has chosen [facility]. CSW has informed [facility] LongsJoseph at [facility] of bed acceptance. York SpanielMonica Dorse Locy MSW,LcSW [phone]

## 2016-04-30 NOTE — Consult Note (Signed)
Patient asleep, awoke to discuss his problems.  His sodium is up to 126, no new abd pain, no vomiting, breathing ok.  I agree with ID plans.  Need to [facility] to arrange [facility] after course of antibiotics.

## 2016-04-30 NOTE — Evaluation (Signed)
Physical Therapy Evaluation Patient Details Name: Jeff Preston MRN: [id number] DOB: 29-Jul-1946 Today's Date: 04/30/2016   History of Present Illness  Pt is a 70 y/o male prsenting with leaking hernia, currently with ostomy bag over it. He has a history of liver cirrhosis, ascites and has had multiple paracenteses performed.   Clinical Impression  Patient is chronically ill and has been treated for leaking hernia around umbilicus. He has complex medical history, and will require IV anti-biotics after discharge. Physically he is able to complete mobility without PT assistance, however gait tolerance is slow and limited and 5x sit to stand of 40 seconds is well above 15" norm for his age indicative of high falls risk. He typically ambulates with SPC, however requires RW on this date and demonstrates decreased gait speed from baseline. Given his clinical picture (chronic hyponatremia) would recommend SNF at discharge to increase his strength and gait tolerance for safe return to home environment.     Follow Up Recommendations SNF    Equipment Recommendations  Rolling walker with 5" wheels    Recommendations for Other Services       Precautions / Restrictions Precautions Precautions: Fall Restrictions Weight Bearing Restrictions: No      Mobility  Bed Mobility Overal bed mobility: Needs Assistance Bed Mobility: Supine to Sit     Supine to sit: Supervision     General bed mobility comments: Patient had HOB elevated, prolonged time required to come to EOB.  Transfers Overall transfer level: Needs assistance Equipment used: Rolling walker (2 wheeled) Transfers: Sit to/from Stand Sit to Stand: Min guard         General transfer comment: able to complete without use of UEs. 5x sit to stand - 40 seconds.   Ambulation/Gait Ambulation/Gait assistance: Min guard Ambulation Distance (Feet): 120 Feet Assistive device: Rolling walker (2 wheeled) Gait Pattern/deviations:  WFL(Within Functional Limits);Decreased step length - right;Decreased step length - left   Gait velocity interpretation: Below normal speed for age/gender General Gait Details: Slow reciprocal gait pattern with no buckling or loss of balance. He reports ambulating at slower speed than baseline due to weakness.   Stairs            Wheelchair Mobility    Modified Rankin (Stroke Patients Only)       Balance Overall balance assessment: Needs assistance Sitting-balance support: No upper extremity supported;Feet supported Sitting balance-Leahy Scale: Good     Standing balance support: Bilateral upper extremity supported Standing balance-Leahy Scale: Fair                               Pertinent Vitals/Pain Pain Assessment:  (Patient does not provide any indication of pain during session, MD questioned as well and he did not provide any definitive answer regarding pain)    Home Living Family/patient expects to be discharged to:: Private residence Living Arrangements: Children Available Help at Discharge: Family Type of Home: House Home Access: Stairs to enter Entrance Stairs-Rails: Right Entrance Stairs-Number of Steps: 2   Home Equipment: None      Prior Function Level of Independence: Independent         Comments: Pt does not get out of the house much, no driving, but able to cook, etc     Hand Dominance        Extremity/Trunk Assessment   Upper Extremity Assessment: Overall WFL for tasks assessed  Lower Extremity Assessment: Overall WFL for tasks assessed         Communication   Communication: No difficulties  Cognition Arousal/Alertness: Awake/alert Behavior During Therapy: Flat affect Overall Cognitive Status: Within Functional Limits for tasks assessed                      General Comments      Exercises        Assessment/Plan    PT Assessment Patient needs continued PT services  PT Diagnosis  Difficulty walking;Generalized weakness   PT Problem List Decreased strength;Decreased activity tolerance;Decreased balance;Decreased mobility;Decreased knowledge of use of DME  PT Treatment Interventions DME instruction;Gait training;Stair training;Therapeutic activities;Therapeutic exercise;Balance training   PT Goals (Current goals can be found in the Care Plan section) Acute Rehab PT Goals Patient Stated Goal: To get stronger  PT Goal Formulation: With patient Time For Goal Achievement: 05/14/16 Potential to Achieve Goals: Good    Frequency Min 2X/week   Barriers to discharge        Co-evaluation               End of Session Equipment Utilized During Treatment: Gait belt Activity Tolerance: Patient tolerated treatment well;Patient limited by fatigue Patient left: in chair;with call bell/phone within reach;with chair alarm set;with nursing/sitter in room Nurse Communication: Mobility status         Time: [id number] PT Time Calculation (min) (ACUTE ONLY): 15 min   Charges:   PT Evaluation $PT Eval Moderate Complexity: 1 Procedure     PT G Codes:       Kerin RansomPatrick A Kaushal Vannice, PT, DPT    04/30/2016, 3:39 PM

## 2016-04-30 NOTE — Progress Notes (Signed)
[facility] [place] at [facility]   PATIENT NAME: Jeff Preston    MR#:  [id number]  DATE OF BIRTH:  07-Jun-1946  SUBJECTIVE:  CHIEF COMPLAINT:   Chief Complaint  Patient presents with  . hernia leaking fluid   . Weakness   Feels a little stronger. Poor appetite. No nausea or vomiting. No abdominal pain. Wants to get up and ambulate.  REVIEW OF SYSTEMS:  CONSTITUTIONAL: No fever,Generalized weakness.  EYES: No blurred or double vision.  EARS, NOSE, AND THROAT: No tinnitus or ear pain.  RESPIRATORY: No cough, shortness of breath, wheezing or hemoptysis.  CARDIOVASCULAR: No chest pain, orthopnea, edema.  GASTROINTESTINAL: No nausea, vomiting, diarrhea or abdominal pain.  GENITOURINARY: No dysuria, hematuria.  ENDOCRINE: No polyuria, nocturia,  HEMATOLOGY: No anemia, easy bruising or bleeding SKIN: No rash or lesion. MUSCULOSKELETAL: No joint pain or arthritis.   NEUROLOGIC: No tingling, numbness, weakness.  PSYCHIATRY: No anxiety or depression.   DRUG ALLERGIES:   Allergies  Allergen Reactions  . Dilantin [Phenytoin Sodium Extended] Rash  . Penicillins Rash and Other (See Comments)    Has patient had a PCN reaction causing immediate rash, facial/tongue/throat swelling, SOB or lightheadedness with hypotension: No Has patient had a PCN reaction causing severe rash involving mucus membranes or skin necrosis: No Has patient had a PCN reaction that required hospitalization No Has patient had a PCN reaction occurring within the last 10 years: No If all of the above answers are "NO", then may proceed with Cephalosporin use.    VITALS:  Blood pressure 129/57, pulse 72, temperature 97.9 F (36.6 C), temperature source Oral, resp. rate 16, height  (1.753 m), weight 81.965 kg (180 lb 11.2 oz), SpO2 99 %.  PHYSICAL EXAMINATION:  GENERAL:  70 y.o.-year-old patient lying in the bed with no acute distress.  EYES: Pupils equal, round, reactive to light  and accommodation. No scleral icterus. Extraocular muscles intact.  HEENT: Head atraumatic, normocephalic. Oropharynx and nasopharynx clear.  NECK:  Supple, no jugular venous distention. No thyroid enlargement, no tenderness.  LUNGS: Normal breath sounds bilaterally, no wheezing, rales,rhonchi or crepitation. No use of accessory muscles of respiration.  CARDIOVASCULAR: S1, S2 normal. No murmurs, rubs, or gallops.  ABDOMEN: Soft, nontender, nondistended. Bowel sounds present. No organomegaly or mass. Colostomy bag over his umbilical hernia at the sciatic fluid. EXTREMITIES: No pedal edema, cyanosis, or clubbing.  NEUROLOGIC: Cranial nerves II through XII are intact. Muscle strength 5-/5 in all extremities. Sensation intact. Gait not checked.  PSYCHIATRIC: The patient is alert and oriented x 3.  SKIN: No obvious rash, lesion, or ulcer.    LABORATORY PANEL:   CBC  Recent Labs Lab 04/29/16 0557  WBC 10.9*  HGB 11.2*  HCT 32.7*  PLT 174   ------------------------------------------------------------------------------------------------------------------  Chemistries   Recent Labs Lab 04/27/16 0512  04/30/16 0435 04/30/16 0836  NA 115*  < > 124* 122*  K 3.9  < > 4.5  --   CL 83*  < > 94*  --   CO2 23  < > 24  --   GLUCOSE 93  < > 99  --   BUN 12  < > 17  --   CREATININE 1.02  < > 1.11  --   CALCIUM 7.7*  < > 7.7*  --   MG  --   --  2.2  --   AST 45*  --   --   --   ALT 15*  --   --   --  ALKPHOS 104  --   --   --   BILITOT 1.4*  --   --   --   < > = values in this interval not displayed. ------------------------------------------------------------------------------------------------------------------  Cardiac Enzymes No results for input(s): TROPONINI in the last 168 hours. ------------------------------------------------------------------------------------------------------------------  RADIOLOGY:  No results found.  EKG:   Orders placed or performed during the  hospital encounter of 04/26/16  . ED EKG  . ED EKG    ASSESSMENT AND PLAN:   * Acute on chronic hyponatremia, Baseline Na 125-130. Improving with normal saline at 50 ML per hour. Follow sodium levels. We'll start Lasix 40 mg daily.  * Group B streptococcus bacterial peritonitis and bacteremia (MSSA) He was treated with ceftriaxone. Patient has penicillin allergy. Was on vancomycin and cefepime,  Check Echo Will need to have the open drainage site addressed or this is likely to continue to occur Antibiotics changed to Ancef now that his culture and sensitivities available. Appreciate infectious disease Dr. Sampson GoonFitzgerald to help.  * Cirrhosis with chronic ascites Negative for recurrent ascites per US. Continue nadolol and hold spironolactone due to hyponatremia.  *Umbilical hernia Per Dr. Orvis BrillLoflin,  Given his cirrhosis he is at high risk for any operative intervention, with that being said would recommend that a draining umbilical hernia have medical optimization and then operative intervention at a center that can manage liver patient's and has the appropriate blood products available such as [facility] or [facility]. No need for emergent operation.  Will need outpatient follow-up.  Per Dr. Orvan Falconerampbell, no palliative care/hospice care this time as per patient's wishes  All the records are reviewed and case discussed with Care Management/Social Workerr. Management plans discussed with the patient, family and they are in agreement.  CODE STATUS: DO NOT RESUSCITATE  TOTAL TIME TAKING CARE OF THIS PATIENT: 35 minutes.   POSSIBLE D/C IN 1-2 DAYS, DEPENDING ON SODIUM LEVELS.   Milagros LollSudini, Georgia Baria R M.D on 04/30/2016 at 12:22 PM  Between 7am to 6pm - Pager - [phone]  After 6pm go to [email or web address] - password [email or web address] [email or web address]  [facility]  Office  [phone]  CC: Primary care physician; Lynnae PrudeELLIOTT, [name], MD

## 2016-04-30 NOTE — Progress Notes (Signed)
[facility] Date of Admission:  04/26/2016     ID: Jeff Preston is a 70 y.o. male with Staph aureus bacteremia from ascitic fluid source with open draining area of ascitic fluid Active Problems:   Hyponatremia   Umbilical hernia with gangrene   Hepatic cirrhosis (HCC)   Subjective: No fevers, no new complaints  ROS  Eleven systems are reviewed and negative except per hpi  Medications:  Antibiotics Given (last 72 hours)    Date/Time Action Medication Dose Rate   04/27/16 2204 Given   ceFEPIme (MAXIPIME) 2 g in dextrose 5 % 50 mL IVPB 2 g 100 mL/hr   04/27/16 2351 Given   vancomycin (VANCOCIN) IVPB 1000 mg/200 mL premix 1,000 mg 200 mL/hr   04/28/16 1134 Given   ceFEPIme (MAXIPIME) 2 g in dextrose 5 % 50 mL IVPB 2 g 100 mL/hr   04/28/16 1300 Given   vancomycin (VANCOCIN) IVPB 1000 mg/200 mL premix 1,000 mg 200 mL/hr   04/29/16 0608 Given   vancomycin (VANCOCIN) IVPB 1000 mg/200 mL premix 1,000 mg 200 mL/hr   04/30/16 0018 Given   vancomycin (VANCOCIN) IVPB 1000 mg/200 mL premix 1,000 mg 200 mL/hr     .  ceFAZolin (ANCEF) IV  2 g Intravenous Q8H  . enoxaparin (LOVENOX) injection  40 mg Subcutaneous Q24H  . furosemide  40 mg Oral Daily  . loratadine  10 mg Oral Daily  . LORazepam  0.5 mg Oral TID  . nadolol  20 mg Oral Daily  . ofloxacin  1 drop Left Eye QID  . pantoprazole  40 mg Oral Daily  . sodium chloride  1 g Oral TID WC    Objective: Vital signs in last 24 hours: Temp:  [97.6 F (36.4 C)-97.9 F (36.6 C)] 97.9 F (36.6 C) (06/21 0800) Pulse Rate:  [62-73] 72 (06/21 0800) Resp:  [12-16] 16 (06/21 0800) BP: (122-129)/(56-61) 129/57 mmHg (06/21 0800) SpO2:  [99 %] 99 % (06/21 0800) Weight:  [81.965 kg (180 lb 11.2 oz)] 81.965 kg (180 lb 11.2 oz) (06/21 0502) Constitutional: He is oriented to person, place, and time. Slowed mentation, dishelved, chronically ill apperaing HENT: anictric Mouth/Throat: Oropharynx is clear  and dry . No oropharyngeal exudate.  Cardiovascular: Normal rate, regular rhythm and normal heart sounds. Pulmonary/Chest: Effort normal and breath sounds normal. No respiratory distress. He has no wheezes.  Abdominal: Soft. midl distention, lower abd with a drainage site with colostomy bag over it. There is some maceration and raw skine at the site.  ymphadenopathy: He has no cervical adenopathy.  Neurological: He is alert but slowed mentation  Skin: Skin is warm and dry. No rash noted. No erythema.  Psychiatric: He has a normal mood and affect. His behavior is normal.   Lab Results  Recent Labs  04/29/16 0557  04/30/16 0435 04/30/16 0836  WBC 10.9*  --   --   --   HGB 11.2*  --   --   --   HCT 32.7*  --   --   --   NA 120*  < > 124* 122*  K 4.0  --  4.5  --   CL 89*  --  94*  --   CO2 23  --  24  --   BUN 19  --  17  --   CREATININE 1.20  --  1.11  --   < > = values in this interval not displayed.  Microbiology: Results for orders  placed or performed during the hospital encounter of 04/26/16  Culture, blood (Routine X 2) w Reflex to ID Panel     Status: None (Preliminary result)   Collection Time: 04/26/16  2:19 PM  Result Value Ref Range Status   Specimen Description BLOOD LEFT HAND  Final   Special Requests BOTTLES DRAWN AEROBIC AND ANAEROBIC  1CC  Final   Culture NO GROWTH 3 DAYS  Final   Report Status PENDING  Incomplete  Culture, blood (Routine X 2) w Reflex to ID Panel     Status: Abnormal   Collection Time: 04/26/16  2:19 PM  Result Value Ref Range Status   Specimen Description BLOOD RIGHT HAND  Final   Special Requests BOTTLES DRAWN AEROBIC AND ANAEROBIC  5CC  Final   Culture  Setup Time   Final    GRAM POSITIVE COCCI AEROBIC BOTTLE ONLY CRITICAL RESULT CALLED TO, READ BACK BY AND VERIFIED WITH: MATT MCBANE @ [phone] 04/27/16 BY [facility]    Culture (A)  Final    STAPHYLOCOCCUS AUREUS SENSITIVITY RESULT REPEATED AND VERIFIED Performed at [facility]     Report Status 04/29/2016 FINAL  Final   Organism ID, Bacteria STAPHYLOCOCCUS AUREUS  Final      Susceptibility   Staphylococcus aureus - MIC*    CIPROFLOXACIN <=0.5 SENSITIVE Sensitive     ERYTHROMYCIN <=0.25 SENSITIVE Sensitive     GENTAMICIN <=0.5 SENSITIVE Sensitive     OXACILLIN <=0.25 SENSITIVE Sensitive     TETRACYCLINE <=1 SENSITIVE Sensitive     VANCOMYCIN 1 SENSITIVE Sensitive     TRIMETH/SULFA <=10 SENSITIVE Sensitive     CLINDAMYCIN <=0.25 SENSITIVE Sensitive     RIFAMPIN <=0.5 SENSITIVE Sensitive     Inducible Clindamycin NEGATIVE Sensitive     * STAPHYLOCOCCUS AUREUS  Blood Culture ID Panel (Reflexed)     Status: Abnormal   Collection Time: 04/26/16  2:19 PM  Result Value Ref Range Status   Enterococcus species NOT DETECTED NOT DETECTED Final   Vancomycin resistance NOT DETECTED NOT DETECTED Final   Listeria monocytogenes NOT DETECTED NOT DETECTED Final   Staphylococcus species DETECTED (A) NOT DETECTED Final    Comment: CRITICAL RESULT CALLED TO, READ BACK BY AND VERIFIED WITH: Matt McBane @ [phone] 04/27/16 by [facility]    Staphylococcus aureus DETECTED (A) NOT DETECTED Final    Comment: CRITICAL RESULT CALLED TO, READ BACK BY AND VERIFIED WITH: Matt McBane @ [phone] 04/27/16 by [facility]    Methicillin resistance DETECTED (A) NOT DETECTED Final    Comment: CRITICAL RESULT CALLED TO, READ BACK BY AND VERIFIED WITH: Matt McBane @ [phone] 04/27/16 by [facility]    Streptococcus species NOT DETECTED NOT DETECTED Final   Streptococcus agalactiae NOT DETECTED NOT DETECTED Final   Streptococcus pneumoniae NOT DETECTED NOT DETECTED Final   Streptococcus pyogenes NOT DETECTED NOT DETECTED Final   Acinetobacter baumannii NOT DETECTED NOT DETECTED Final   Enterobacteriaceae species NOT DETECTED NOT DETECTED Final   Enterobacter cloacae complex NOT DETECTED NOT DETECTED Final   Escherichia coli NOT DETECTED NOT DETECTED Final   Klebsiella oxytoca NOT DETECTED NOT DETECTED Final   Klebsiella  pneumoniae NOT DETECTED NOT DETECTED Final   Proteus species NOT DETECTED NOT DETECTED Final   Serratia marcescens NOT DETECTED NOT DETECTED Final   Carbapenem resistance NOT DETECTED NOT DETECTED Final   Haemophilus influenzae NOT DETECTED NOT DETECTED Final   Neisseria meningitidis NOT DETECTED NOT DETECTED Final   Pseudomonas aeruginosa NOT DETECTED NOT DETECTED Final  Candida albicans NOT DETECTED NOT DETECTED Final   Candida glabrata NOT DETECTED NOT DETECTED Final   Candida krusei NOT DETECTED NOT DETECTED Final   Candida parapsilosis NOT DETECTED NOT DETECTED Final   Candida tropicalis NOT DETECTED NOT DETECTED Final  Culture, blood (Routine X 2) w Reflex to ID Panel     Status: None (Preliminary result)   Collection Time: 04/28/16  3:56 PM  Result Value Ref Range Status   Specimen Description BLOOD RT WRIST  Final   Special Requests BOTTLES DRAWN AEROBIC AND ANAEROBIC 6CC  Final   Culture NO GROWTH < 24 HOURS  Final   Report Status PENDING  Incomplete  Culture, blood (Routine X 2) w Reflex to ID Panel     Status: None (Preliminary result)   Collection Time: 04/28/16  5:53 PM  Result Value Ref Range Status   Specimen Description BLOOD RT H  Final   Special Requests BOTTLES DRAWN AEROBIC AND ANAEROBIC 10CC  Final   Culture NO GROWTH < 24 HOURS  Final   Report Status PENDING  Incomplete    Studies/Results: No results found. Study Conclusions  - Left ventricle: The cavity size was normal. Systolic function was  normal. The estimated ejection fraction was in the range of 55%  to 60%. Wall motion was normal; there were no regional wall  motion abnormalities. Left ventricular diastolic function  parameters were normal. - Left atrium: The atrium was normal in size. - Right ventricle: Systolic function was normal. - Pulmonary arteries: Systolic pressure was within the normal  range.  Impressions:  - Normal study.  Assessment/Plan: CONLAN MICELI is a 70 y.o.  male admitted 6/17 with weakness and Na 107. He was found to have MRSA bacteremia. 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  He had staph aureus in his ascitic fluid in May 2017. Likely source of MRSA bacteremia is the open abd wound with drainage of ascites. There is discrepancy between [employer] and routine culture whether MRSA or MSSA but final result confirmed as MSSA Echo negative BCX x2 to doc clearance pending from June 19th   Recommendationse Continue ancef Can place picc line - ordered Will need to have the open drainage site addressed or this is likely to continue to occur - can consider suppressive therapy if not able to be addressd Will need 2 weeks min Vanco from date of neg [facility] - June 19th - see abx order sheet  Thank you very much for the consult. Will follow with you.  FITZGERALD, DAVID P   04/30/2016, 2:28 PM

## 2016-04-30 NOTE — Progress Notes (Signed)
Infectious Disease Long Term IV Antibiotic Orders  Diagnosis: MSSA bacteremia, Grp B strep SBP, open abd wound draining ascites  Culture results  Culture, blood (Routine X 2) w Reflex to ID Panel [[id number]] (Abnormal)  Collected: 04/26/16 1419   Order Status: Completed Specimen Information: Blood from BLOOD Updated: 04/30/16 1127    Specimen Description BLOOD RIGHT HAND     Special Requests BOTTLES DRAWN AEROBIC AND ANAEROBIC 5CC     Culture Setup Time --     Result:     GRAM POSITIVE COCCI  AEROBIC BOTTLE ONLY  CRITICAL RESULT CALLED TO, READ BACK BY AND VERIFIED WITH:  MATT MCBANE @ [phone] 04/27/16 BY [place]      Culture -- (A)     Result:     STAPHYLOCOCCUS AUREUS  SENSITIVITY RESULT REPEATED AND VERIFIED  Performed at [facility]      Report Status 04/29/2016 FINAL     Organism ID, Bacteria STAPHYLOCOCCUS AUREUS    Culture & Susceptibility    STAPHYLOCOCCUS AUREUS    Antibiotic Sensitivity Microscan Status   CIPROFLOXACIN Sensitive <=0.5 SENSITIVE Final   Method: MIC   CLINDAMYCIN Sensitive <=0.25 SENSITIVE Final   Method: MIC   ERYTHROMYCIN Sensitive <=0.25 SENSITIVE Final   Method: MIC   GENTAMICIN Sensitive <=0.5 SENSITIVE Final   Method: MIC   Inducible Clindamycin Sensitive NEGATIVE Final   Method: MIC   OXACILLIN Sensitive <=0.25 SENSITIVE Final   Method: MIC   RIFAMPIN Sensitive <=0.5 SENSITIVE Final   Method: MIC   TETRACYCLINE Sensitive <=1 SENSITIVE Final   Method: MIC   TRIMETH/SULFA Sensitive <=10 SENSITIVE Final   Method: MIC   VANCOMYCIN              Allergies:  Allergies  Allergen Reactions  . Dilantin [Phenytoin Sodium Extended] Rash  . Penicillins Rash and Other (See Comments)    Has patient had a PCN reaction causing immediate rash, facial/tongue/throat swelling, SOB or lightheadedness with hypotension: No Has patient had a PCN reaction causing severe rash involving mucus membranes or skin  necrosis: No Has patient had a PCN reaction that required hospitalization No Has patient had a PCN reaction occurring within the last 10 years: No If all of the above answers are "NO", then may proceed with Cephalosporin use.    Discharge antibiotics Cefazolin    2   grams every  8 hours  PICC Care per protocol Labs weekly while on IV antibiotics      CBC w diff   Comprehensive met panel   Planned duration of antibiotics 2 weeks from June 19th.  WIll then need oral doxycyline 100 bid for 6 weeks to treat infected ascites and open wound  Stop date  July 5th   Follow up clinic date TBD  FAX weekly labs to [phone]  Leonel Ramsay, MD

## 2016-05-01 LAB — CULTURE, BLOOD (ROUTINE X 2): Culture: NO GROWTH

## 2016-05-01 LAB — SODIUM: Sodium: 127 mmol/L — ABNORMAL LOW (ref 135–145)

## 2016-05-01 MED ORDER — LORAZEPAM 0.5 MG PO TABS
0.5000 mg | ORAL_TABLET | Freq: Two times a day (BID) | ORAL | Status: DC
Start: 2016-05-01 — End: 2016-09-06

## 2016-05-01 MED ORDER — SODIUM CHLORIDE 1 G PO TABS: 1.0000 g | ORAL_TABLET | Freq: Every day | ORAL | Status: DC

## 2016-05-01 MED ORDER — SODIUM CHLORIDE 1 G PO TABS
1.0000 g | ORAL_TABLET | Freq: Two times a day (BID) | ORAL | Status: DC
Start: 2016-05-01 — End: 2016-05-01

## 2016-05-01 MED ORDER — METRONIDAZOLE 500 MG PO TABS
500.0000 mg | ORAL_TABLET | Freq: Three times a day (TID) | ORAL | Status: DC
Start: 2016-05-01 — End: 2016-05-01

## 2016-05-01 MED ORDER — ZOLPIDEM TARTRATE 10 MG PO TABS: 10.0000 mg | ORAL_TABLET | Freq: Every evening | ORAL | Status: DC | PRN

## 2016-05-01 MED ORDER — TRAMADOL HCL 50 MG PO TABS: 50.0000 mg | ORAL_TABLET | Freq: Four times a day (QID) | ORAL | Status: DC | PRN

## 2016-05-01 MED ORDER — LACTULOSE 10 GM/15ML PO SOLN: 10.0000 g | Freq: Every day | ORAL | Status: DC

## 2016-05-01 MED ORDER — METRONIDAZOLE 500 MG PO TABS: 500.0000 mg | ORAL_TABLET | Freq: Three times a day (TID) | ORAL | Status: DC

## 2016-05-01 MED ORDER — FUROSEMIDE 40 MG PO TABS: 40.0000 mg | ORAL_TABLET | Freq: Every day | ORAL | Status: AC

## 2016-05-01 MED ORDER — CEFAZOLIN SODIUM-DEXTROSE 2-4 GM/100ML-% IV SOLN: 2.0000 g | Freq: Three times a day (TID) | INTRAVENOUS | Status: DC

## 2016-05-01 NOTE — Progress Notes (Signed)
Called report to [facility]. Patient IV removed. Patient and family understand plan of care. Family will transport patient to facility this afternoon.

## 2016-05-01 NOTE — Progress Notes (Signed)
Discharge packet given to family to take to [facility]. Patient getting dressed and sister will transfer patient.

## 2016-05-01 NOTE — Discharge Instructions (Signed)
°  DIET:  Regular diet  DISCHARGE CONDITION:  Stable  ACTIVITY:  Activity as tolerated  OXYGEN:  Home Oxygen: No.   Oxygen Delivery: room air  DISCHARGE LOCATION:  nursing home   If you experience worsening of your admission symptoms, develop shortness of breath, life threatening emergency, suicidal or homicidal thoughts you must seek medical attention immediately by calling 911 or calling your MD immediately  if symptoms less severe.  You Must read complete instructions/literature along with all the possible adverse reactions/side effects for all the Medicines you take and that have been prescribed to you. Take any new Medicines after you have completely understood and accpet all the possible adverse reactions/side effects.   Please note  You were cared for by a hospitalist during your hospital stay. If you have any questions about your discharge medications or the care you received while you were in the hospital after you are discharged, you can call the unit and asked to speak with the hospitalist on call if the hospitalist that took care of you is not available. Once you are discharged, your primary care physician will handle any further medical issues. Please note that NO REFILLS for any discharge medications will be authorized once you are discharged, as it is imperative that you return to your primary care physician (or establish a relationship with a primary care physician if you do not have one) for your aftercare needs so that they can reassess your need for medications and monitor your lab values.   Continue colostomy bag on umbilical hernia for ascitic fluid leak

## 2016-05-01 NOTE — Clinical Social Work Note (Signed)
Patient discharging today to [facility]. Patient's sister is aware and will provide the transportation. Discharge information sent to [facility]. York SpanielMonica Anjel Pardo MSW,LCSW [phone]

## 2016-05-01 NOTE — Progress Notes (Signed)
ED Culture Report Follow up  Body fluid culture in ED from 6/15 with group B strep, peptostreptococcus spp, prevotella disiens. Pt currently admitted. Clinical pharmacist spoke with rounding MD, abx adjusted.

## 2016-05-01 NOTE — Discharge Summary (Addendum)
[facility] - [place] at [facility]   PATIENT NAME: Jeff Preston    MR#:  [id number]  DATE OF BIRTH:  08-17-1946  DATE OF ADMISSION:  04/26/2016 ADMITTING PHYSICIAN: Milagros Loll, MD  DATE OF DISCHARGE: 05/01/2016  PRIMARY CARE PHYSICIAN: Lynnae Prude, MD   ADMISSION DIAGNOSIS:  Hyponatremia [E87.1]  DISCHARGE DIAGNOSIS:  Active Problems:   Hyponatremia   Umbilical hernia with gangrene   Hepatic cirrhosis (HCC)   SECONDARY DIAGNOSIS:   Past Medical History  Diagnosis Date  . Hypertension   . Depression   . GERD (gastroesophageal reflux disease)   . Cirrhosis (HCC)   . Anxiety   . Umbilical hernia   . Ascites   . Chronic hyponatremia   . DNR (do not resuscitate)      ADMITTING HISTORY  Jeff Preston is a 70 y.o. male with a known history of Alcoholic cirrhosis, chronic ascites, umbilical hernia leaking ascetic fluid for which she refused surgery in the past presents to the emergency room brought in by family due to worsening weakness. Patient was seen in the emergency room due to leaking from his umbilical site in spite of putting a colostomy bag. Returns with the same problem. His fluid sent to the lab 2 days back is growing group B streptococcus. Sodium has been found to be 107. Patient has had poor intake. Decreased urine output. Has not had any alcohol or beer in one month.  HOSPITAL COURSE:   * Acute on chronic hyponatremia Treated with normal saline and salt tablets. Later started on Lasix. Sodium has improved to 127. His baseline is between 125-130.  * Group B streptococcus, Peptostreptococcus and Prevotella bacterial peritonitis and MSSA bacteremia He was treated with ceftriaxone. Changed to Ancef. 2 weeks of IV ancef PO Flagyl from 04/28/2016 Echo- No endocarditis Will need to have the open drainage site addressed or this is likely to continue to occur  * Cirrhosis with chronic ascites Negative for recurrent ascites per  [place]. Continue nadolol and hold spironolactone due to hyponatremia.  *Umbilical hernia Surgical team  recommended that a draining umbilical hernia have medical optimization and then operative intervention at a center that can manage liver patient's and has the appropriate blood products available such as [facility] or [facility]. No need for emergent operation.  Will need outpatient follow-up. 1-2 weeks. Continue colostomy bag over umbilical hernia  Stable for discharge to SNF  CONSULTS OBTAINED:  Treatment Team:  Mady Haagensen, MD Mick Sell, MD  DRUG ALLERGIES:   Allergies  Allergen Reactions  . Dilantin [Phenytoin Sodium Extended] Rash  . Penicillins Rash and Other (See Comments)    Has patient had a PCN reaction causing immediate rash, facial/tongue/throat swelling, SOB or lightheadedness with hypotension: No Has patient had a PCN reaction causing severe rash involving mucus membranes or skin necrosis: No Has patient had a PCN reaction that required hospitalization No Has patient had a PCN reaction occurring within the last 10 years: No If all of the above answers are "NO", then may proceed with Cephalosporin use.    DISCHARGE MEDICATIONS:   Current Discharge Medication List    START taking these medications   Details  ceFAZolin (ANCEF) 2-4 GM/100ML-% IVPB Inject 100 mLs (2 g total) into the vein every 8 (eight) hours. Qty: 1 each    lactulose (CHRONULAC) 10 GM/15ML solution Take 15 mLs (10 g total) by mouth daily. Qty: 240 mL, Refills: 0    metroNIDAZOLE (FLAGYL) 500 MG tablet Take 1 tablet (  500 mg total) by mouth every 8 (eight) hours. Qty: 41 tablet, Refills: 0    sodium chloride 1 g tablet Take 1 tablet (1 g total) by mouth daily.    traMADol (ULTRAM) 50 MG tablet Take 1 tablet (50 mg total) by mouth every 6 (six) hours as needed for moderate pain or severe pain. Qty: 30 tablet, Refills: 0      CONTINUE these medications which have CHANGED   Details  furosemide  (LASIX) 40 MG tablet Take 1 tablet (40 mg total) by mouth daily. Qty: 30 tablet    LORazepam (ATIVAN) 0.5 MG tablet Take 1 tablet (0.5 mg total) by mouth 2 (two) times daily. Qty: 15 tablet, Refills: 0    zolpidem (AMBIEN) 10 MG tablet Take 1 tablet (10 mg total) by mouth at bedtime as needed for sleep. Qty: 10 tablet, Refills: 0      CONTINUE these medications which have NOT CHANGED   Details  bisacodyl (DULCOLAX) 5 MG EC tablet Take 1 tablet (5 mg total) by mouth daily as needed for moderate constipation. Qty: 30 tablet, Refills: 0    nadolol (CORGARD) 20 MG tablet Take 20 mg by mouth daily.    ofloxacin (OCUFLOX) 0.3 % ophthalmic solution Place 1 drop into the left eye 4 (four) times daily. Qty: 5 mL, Refills: 0    omeprazole (PRILOSEC) 20 MG capsule Take 20 mg by mouth 2 (two) times daily.     ondansetron (ZOFRAN-ODT) 4 MG disintegrating tablet Take 4 mg by mouth every 8 (eight) hours as needed for nausea or vomiting.    simethicone (MYLICON) 80 MG chewable tablet Chew 160-240 mg by mouth every 6 (six) hours as needed for flatulence.       STOP taking these medications     ibuprofen (ADVIL,MOTRIN) 200 MG tablet      nystatin (NYSTATIN) powder      spironolactone (ALDACTONE) 50 MG tablet      acetaminophen (TYLENOL) 325 MG tablet         Today   VITAL SIGNS:  Blood pressure 138/73, pulse 69, temperature 98.1 F (36.7 C), temperature source Oral, resp. rate 22, height 5\' 9"  (1.753 m), weight 82.963 kg (182 lb 14.4 oz), SpO2 97 %.  I/O:   Intake/Output Summary (Last 24 hours) at 05/01/16 1108 Last data filed at 05/01/16 1047  Gross per 24 hour  Intake   1793 ml  Output   2200 ml  Net   -407 ml    PHYSICAL EXAMINATION:  Physical Exam  GENERAL:  70 y.o.-year-old patient lying in the bed with no acute distress.  LUNGS: Normal breath sounds bilaterally, no wheezing, rales,rhonchi or crepitation. No use of accessory muscles of respiration.  CARDIOVASCULAR: S1,  S2 normal. No murmurs, rubs, or gallops.  ABDOMEN: Soft, non-tender, non-distended. Bowel sounds present. No organomegaly or mass.  Umbilical hernia with colostomy bag on it NEUROLOGIC: Moves all 4 extremities. PSYCHIATRIC: The patient is alert and oriented x 3.  SKIN: No obvious rash, lesion, or ulcer.   DATA REVIEW:   CBC  Recent Labs Lab 04/29/16 0557  WBC 10.9*  HGB 11.2*  HCT 32.7*  PLT 174    Chemistries   Recent Labs Lab 04/27/16 0512  04/30/16 0435  05/01/16 0226  NA 115*  < > 124*  < > 127*  K 3.9  < > 4.5  --   --   CL 83*  < > 94*  --   --   CO2  23  < > 24  --   --   GLUCOSE 93  < > 99  --   --   BUN 12  < > 17  --   --   CREATININE 1.02  < > 1.11  --   --   CALCIUM 7.7*  < > 7.7*  --   --   MG  --   --  2.2  --   --   AST 45*  --   --   --   --   ALT 15*  --   --   --   --   ALKPHOS 104  --   --   --   --   BILITOT 1.4*  --   --   --   --   < > = values in this interval not displayed.  Cardiac Enzymes No results for input(s): TROPONINI in the last 168 hours.  Microbiology Results  Results for orders placed or performed during the hospital encounter of 04/26/16  Culture, blood (Routine X 2) w Reflex to ID Panel     Status: None   Collection Time: 04/26/16  2:19 PM  Result Value Ref Range Status   Specimen Description BLOOD LEFT HAND  Final   Special Requests BOTTLES DRAWN AEROBIC AND ANAEROBIC  1CC  Final   Culture NO GROWTH 5 DAYS  Final   Report Status 05/01/2016 FINAL  Final  Culture, blood (Routine X 2) w Reflex to ID Panel     Status: Abnormal   Collection Time: 04/26/16  2:19 PM  Result Value Ref Range Status   Specimen Description BLOOD RIGHT HAND  Final   Special Requests BOTTLES DRAWN AEROBIC AND ANAEROBIC  5CC  Final   Culture  Setup Time   Final    GRAM POSITIVE COCCI AEROBIC BOTTLE ONLY CRITICAL RESULT CALLED TO, READ BACK BY AND VERIFIED WITH: MATT MCBANE @ [phone] 04/27/16 BY [facility]    Culture (A)  Final    STAPHYLOCOCCUS  AUREUS SENSITIVITY RESULT REPEATED AND VERIFIED Performed at [facility]    Report Status 04/29/2016 FINAL  Final   Organism ID, Bacteria STAPHYLOCOCCUS AUREUS  Final      Susceptibility   Staphylococcus aureus - MIC*    CIPROFLOXACIN <=0.5 SENSITIVE Sensitive     ERYTHROMYCIN <=0.25 SENSITIVE Sensitive     GENTAMICIN <=0.5 SENSITIVE Sensitive     OXACILLIN <=0.25 SENSITIVE Sensitive     TETRACYCLINE <=1 SENSITIVE Sensitive     VANCOMYCIN 1 SENSITIVE Sensitive     TRIMETH/SULFA <=10 SENSITIVE Sensitive     CLINDAMYCIN <=0.25 SENSITIVE Sensitive     RIFAMPIN <=0.5 SENSITIVE Sensitive     Inducible Clindamycin NEGATIVE Sensitive     * STAPHYLOCOCCUS AUREUS  Blood Culture ID Panel (Reflexed)     Status: Abnormal   Collection Time: 04/26/16  2:19 PM  Result Value Ref Range Status   Enterococcus species NOT DETECTED NOT DETECTED Final   Vancomycin resistance NOT DETECTED NOT DETECTED Final   Listeria monocytogenes NOT DETECTED NOT DETECTED Final   Staphylococcus species DETECTED (A) NOT DETECTED Final    Comment: CRITICAL RESULT CALLED TO, READ BACK BY AND VERIFIED WITH: Matt McBane @ [phone] 04/27/16 by [facility]    Staphylococcus aureus DETECTED (A) NOT DETECTED Final    Comment: CRITICAL RESULT CALLED TO, READ BACK BY AND VERIFIED WITH: Matt McBane @ [phone] 04/27/16 by [facility]    Methicillin resistance DETECTED (A) NOT DETECTED Final  Comment: CRITICAL RESULT CALLED TO, READ BACK BY AND VERIFIED WITH: Matt McBane @ [phone] 04/27/16 by [facility]    Streptococcus species NOT DETECTED NOT DETECTED Final   Streptococcus agalactiae NOT DETECTED NOT DETECTED Final   Streptococcus pneumoniae NOT DETECTED NOT DETECTED Final   Streptococcus pyogenes NOT DETECTED NOT DETECTED Final   Acinetobacter baumannii NOT DETECTED NOT DETECTED Final   Enterobacteriaceae species NOT DETECTED NOT DETECTED Final   Enterobacter cloacae complex NOT DETECTED NOT DETECTED Final   Escherichia coli NOT DETECTED NOT  DETECTED Final   Klebsiella oxytoca NOT DETECTED NOT DETECTED Final   Klebsiella pneumoniae NOT DETECTED NOT DETECTED Final   Proteus species NOT DETECTED NOT DETECTED Final   Serratia marcescens NOT DETECTED NOT DETECTED Final   Carbapenem resistance NOT DETECTED NOT DETECTED Final   Haemophilus influenzae NOT DETECTED NOT DETECTED Final   Neisseria meningitidis NOT DETECTED NOT DETECTED Final   Pseudomonas aeruginosa NOT DETECTED NOT DETECTED Final   Candida albicans NOT DETECTED NOT DETECTED Final   Candida glabrata NOT DETECTED NOT DETECTED Final   Candida krusei NOT DETECTED NOT DETECTED Final   Candida parapsilosis NOT DETECTED NOT DETECTED Final   Candida tropicalis NOT DETECTED NOT DETECTED Final  Culture, blood (Routine X 2) w Reflex to ID Panel     Status: None (Preliminary result)   Collection Time: 04/28/16  3:56 PM  Result Value Ref Range Status   Specimen Description BLOOD RT WRIST  Final   Special Requests BOTTLES DRAWN AEROBIC AND ANAEROBIC 6CC  Final   Culture NO GROWTH 3 DAYS  Final   Report Status PENDING  Incomplete  Culture, blood (Routine X 2) w Reflex to ID Panel     Status: None (Preliminary result)   Collection Time: 04/28/16  5:53 PM  Result Value Ref Range Status   Specimen Description BLOOD RT H  Final   Special Requests BOTTLES DRAWN AEROBIC AND ANAEROBIC 10CC  Final   Culture NO GROWTH 3 DAYS  Final   Report Status PENDING  Incomplete    RADIOLOGY:  Dg Chest Port 1 View  04/30/2016  CLINICAL DATA:  Status post PICC placement EXAM: PORTABLE CHEST 1 VIEW COMPARISON:  11/05/2014 FINDINGS: Right PICC tip projects in the lower superior vena cava. Heart, mediastinum and hila are unremarkable. Lungs are clear.  No pleural effusion or pneumothorax. IMPRESSION: 1. Right PICC is well positioned with the catheter tip in the lower superior vena cava. 2. No acute cardiopulmonary disease. Electronically Signed   By: Amie Portland M.D.   On: 04/30/2016 18:02     Follow up with  PCP in 1 week. [facility] surgery for umbilical hernia in 1 week  Management plans discussed with the patient, family and they are in agreement.  CODE STATUS:     Code Status Orders        Start     Ordered   04/26/16 1536  Do not attempt resuscitation (DNR)   Continuous    Question Answer Comment  In the event of cardiac or respiratory ARREST Do not call a "code blue"   In the event of cardiac or respiratory ARREST Do not perform Intubation, CPR, defibrillation or ACLS   In the event of cardiac or respiratory ARREST Use medication by any route, position, wound care, and other measures to relive pain and suffering. May use oxygen, suction and manual treatment of airway obstruction as needed for comfort.      04/26/16 1536    Code Status  History    Date Active Date Inactive Code Status Order ID Comments User Context   03/14/2016 12:30 PM 03/17/2016 10:33 PM DNR [id number]  Suan HalterMargaret F Campbell, MD Inpatient   03/12/2016  8:08 PM 03/14/2016 12:29 PM Full Code [id number]  Auburn BilberryShreyang Patel, MD ED      TOTAL TIME TAKING CARE OF THIS PATIENT ON DAY OF DISCHARGE: more than 30 minutes.   Milagros LollSudini, Joleah Kosak R M.D on 05/01/2016 at 11:08 AM  Between 7am to 6pm - Pager - [phone]  After 6pm go to [email or web address] - password [email or web address] [facility]  [facility]  Office  [phone]  CC: Primary care physician; Lynnae PrudeELLIOTT, [name], MD  Note: This dictation was prepared with Dragon dictation along with smaller phrase technology. Any transcriptional errors that result from this process are unintentional.

## 2016-05-03 LAB — CULTURE, BLOOD (ROUTINE X 2)
Culture: NO GROWTH
Culture: NO GROWTH

## 2016-05-07 ENCOUNTER — Emergency Department
Admission: EM | Admit: 2016-05-07 | Discharge: 2016-05-07 | Disposition: A | Discharge status: Home / Self Care | Payer: Medicare Other | Attending: Emergency Medicine | Admitting: Emergency Medicine

## 2016-05-07 DIAGNOSIS — Z87891 Personal history of nicotine dependence: Secondary | ICD-10-CM | POA: Diagnosis not present

## 2016-05-07 DIAGNOSIS — F329 Major depressive disorder, single episode, unspecified: Secondary | ICD-10-CM | POA: Diagnosis not present

## 2016-05-07 DIAGNOSIS — Z8719 Personal history of other diseases of the digestive system: Secondary | ICD-10-CM | POA: Insufficient documentation

## 2016-05-07 DIAGNOSIS — Z79899 Other long term (current) drug therapy: Secondary | ICD-10-CM | POA: Diagnosis not present

## 2016-05-07 DIAGNOSIS — R188 Other ascites: Secondary | ICD-10-CM | POA: Diagnosis not present

## 2016-05-07 DIAGNOSIS — I10 Essential (primary) hypertension: Secondary | ICD-10-CM | POA: Diagnosis not present

## 2016-05-07 DIAGNOSIS — N5089 Other specified disorders of the male genital organs: Secondary | ICD-10-CM | POA: Diagnosis present

## 2016-05-07 DIAGNOSIS — Z792 Long term (current) use of antibiotics: Secondary | ICD-10-CM | POA: Diagnosis not present

## 2016-05-07 DIAGNOSIS — K4090 Unilateral inguinal hernia, without obstruction or gangrene, not specified as recurrent: Secondary | ICD-10-CM | POA: Diagnosis not present

## 2016-05-07 LAB — COMPREHENSIVE METABOLIC PANEL
ALBUMIN: 2.7 g/dL — AB (ref 3.5–5.0)
ALT: 6 U/L — ABNORMAL LOW (ref 17–63)
AST: 38 U/L (ref 15–41)
Alkaline Phosphatase: 102 U/L (ref 38–126)
Anion gap: 8 (ref 5–15)
BILIRUBIN TOTAL: 0.7 mg/dL (ref 0.3–1.2)
BUN: 15 mg/dL (ref 6–20)
CHLORIDE: 99 mmol/L — AB (ref 101–111)
CO2: 25 mmol/L (ref 22–32)
Calcium: 7.9 mg/dL — ABNORMAL LOW (ref 8.9–10.3)
Creatinine, Ser: 1.35 mg/dL — ABNORMAL HIGH (ref 0.61–1.24)
GFR calc Af Amer: >60 mL/min (ref >60)
GFR calc non Af Amer: 52 mL/min — ABNORMAL LOW (ref >60)
GLUCOSE: 118 mg/dL — AB (ref 65–99)
POTASSIUM: 3.3 mmol/L — AB (ref 3.5–5.1)
SODIUM: 132 mmol/L — AB (ref 135–145)
TOTAL PROTEIN: 6 g/dL — AB (ref 6.5–8.1)

## 2016-05-07 LAB — CBC
HEMATOCRIT: 32.4 % — AB (ref 40.0–52.0)
Hemoglobin: 11.2 g/dL — ABNORMAL LOW (ref 13.0–18.0)
MCH: 28.3 pg (ref 26.0–34.0)
MCHC: 34.4 g/dL (ref 32.0–36.0)
MCV: 82.2 fL (ref 80.0–100.0)
Platelets: 155 10*3/uL (ref 150–440)
RBC: 3.94 MIL/uL — ABNORMAL LOW (ref 4.40–5.90)
RDW: 17.5 % — AB (ref 11.5–14.5)
WBC: 9.7 10*3/uL (ref 3.8–10.6)

## 2016-05-07 NOTE — Discharge Instructions (Signed)
Your workup today is within normal limits. His scrotum is consistent with a enlarging hernia. There is no signs of obstruction at this point. [facility] for further evaluation by calling the number provided: [phone] Return to the emergency department for any scrotal/abdominal pain, Significant constipation, or any other symptom concerning to yourself.   Hernia, Adult A hernia is the bulging of an organ or tissue through a weak spot in the muscles of the abdomen (abdominal wall). Hernias develop most often near the navel or groin. There are many kinds of hernias. Common kinds include:  Femoral hernia. This kind of hernia develops under the groin in the upper thigh area.  Inguinal hernia. This kind of hernia develops in the groin or scrotum.  Umbilical hernia. This kind of hernia develops near the navel.  Hiatal hernia. This kind of hernia causes part of the stomach to be pushed up into the chest.  Incisional hernia. This kind of hernia bulges through a scar from an abdominal surgery. CAUSES This condition may be caused by:  Heavy lifting.  Coughing over a long period of time.  Straining to have a bowel movement.  An incision made during an abdominal surgery.  A birth defect (congenital defect).  Excess weight or obesity.  Smoking.  Poor nutrition.  Cystic fibrosis.  Excess fluid in the abdomen.  Undescended testicles. SYMPTOMS Symptoms of a hernia include:  A lump on the abdomen. This is the first sign of a hernia. The lump may become more obvious with standing, straining, or coughing. It may get bigger over time if it is not treated or if the condition causing it is not treated.  Pain. A hernia is usually painless, but it may become painful over time if treatment is delayed. The pain is usually dull and may get worse with standing or lifting heavy objects. Sometimes a hernia gets tightly squeezed in the weak spot (strangulated) or stuck  there (incarcerated) and causes additional symptoms. These symptoms may include:  Vomiting.  Nausea.  Constipation.  Irritability. DIAGNOSIS A hernia may be diagnosed with:  A physical exam. During the exam your health care provider may ask you to cough or to make a specific movement, because a hernia is usually more visible when you move.  Imaging tests. These can include:  X-rays.  Ultrasound.  CT scan. TREATMENT A hernia that is small and painless may not need to be treated. A hernia that is large or painful may be treated with surgery. Inguinal hernias may be treated with surgery to prevent incarceration or strangulation. Strangulated hernias are always treated with surgery, because lack of blood to the trapped organ or tissue can cause it to die. Surgery to treat a hernia involves pushing the bulge back into place and repairing the weak part of the abdomen. HOME CARE INSTRUCTIONS  Avoid straining.  Do not lift anything heavier than 10 lb (4.5 kg).  Lift with your leg muscles, not your back muscles. This helps avoid strain.  When coughing, try to cough gently.  Prevent constipation. Constipation leads to straining with bowel movements, which can make a hernia worse or cause a hernia repair to break down. You can prevent constipation by:  Eating a high-fiber diet that includes plenty of fruits and vegetables.  Drinking enough fluids to keep your urine clear or pale yellow. Aim to drink 6-8 glasses of water per day.  Using a stool softener as directed by your health care provider.  Lose weight, if  you are overweight.  Do not use any tobacco products, including cigarettes, chewing tobacco, or electronic cigarettes. If you need help quitting, ask your health care provider.  Keep all follow-up visits as directed by your health care provider. This is important. Your health care provider may need to monitor your condition. SEEK MEDICAL CARE IF:  You have swelling,  redness, and pain in the affected area.  Your bowel habits change. SEEK IMMEDIATE MEDICAL CARE IF:  You have a fever.  You have abdominal pain that is getting worse.  You feel nauseous or you vomit.  You cannot push the hernia back in place by gently pressing on it while you are lying down.  The hernia:  Changes in shape or size.  Is stuck outside the abdomen.  Becomes discolored.  Feels hard or tender.   This information is not intended to replace advice given to you by your health care provider. Make sure you discuss any questions you have with your health care provider.   Document Released: 10/27/2005 Document Revised: 11/17/2014 Document Reviewed: 09/06/2014 Elsevier Interactive Patient Education [employer]2016 [employer].

## 2016-05-07 NOTE — ED Notes (Signed)
Pts sister called, [place].    Phone number: [phone]

## 2016-05-07 NOTE — ED Notes (Signed)
Called [facility], spoke w/ Paramedicwan RN.  She stated pt would need EMS transport back.

## 2016-05-07 NOTE — ED Provider Notes (Signed)
[facility] Emergency Department Provider Note  Time seen: 4:08 PM  I have reviewed the triage vital signs and the nursing notes.   HISTORY  Chief Complaint Groin Swelling    HPI Jeff Preston is a 70 y.o. male with a past medical history of hypertension depression, gastric reflux, cirrhosis,chronic hyponatremia, who presents to the emergency department with an enlarging scrotum. According to the patient and EMS report the patient is currently at [facility], they state his scrotum has been enlarging so they sent him to the emergency department for evaluation. Patient states an enlarging scrotum for the past few weeks, progressively getting larger. Patient has a history of a ruptured umbilical hernia which drains ascitic fluid. There is an ostomy bag over the opening which drains ascitic fluid. He states for the past 2 weeks it is still draining but less than normal and his scrotum is enlarging. He believes the ascitic fluid is collecting in the scrotum. Patient denies any pain in the scrotum. Denies any abdominal pain. Denies any fever.     Past Medical History  Diagnosis Date  . Hypertension   . Depression   . GERD (gastroesophageal reflux disease)   . Cirrhosis (HCC)   . Anxiety   . Umbilical hernia   . Ascites   . Chronic hyponatremia   . DNR (do not resuscitate)     Patient Active Problem List   Diagnosis Date Noted  . Hepatic cirrhosis (HCC) 04/28/2016  . Hyponatremia 04/26/2016  . Umbilical hernia with gangrene   . Open wound of umbilical region   . Ascites 03/12/2016    Past Surgical History  Procedure Laterality Date  . Throat surgery    . Cholecystectomy      Current Outpatient Rx  Name  Route  Sig  Dispense  Refill  . bisacodyl (DULCOLAX) 5 MG EC tablet   Oral   Take 1 tablet (5 mg total) by mouth daily as needed for moderate constipation.   30 tablet   0   . ceFAZolin (ANCEF) 2-4 GM/100ML-% IVPB    Intravenous   Inject 100 mLs (2 g total) into the vein every 8 (eight) hours.   1 each      . furosemide (LASIX) 40 MG tablet   Oral   Take 1 tablet (40 mg total) by mouth daily.   30 tablet      . lactulose (CHRONULAC) 10 GM/15ML solution   Oral   Take 15 mLs (10 g total) by mouth daily.   240 mL   0   . LORazepam (ATIVAN) 0.5 MG tablet   Oral   Take 1 tablet (0.5 mg total) by mouth 2 (two) times daily.   15 tablet   0   . metroNIDAZOLE (FLAGYL) 500 MG tablet   Oral   Take 1 tablet (500 mg total) by mouth every 8 (eight) hours.   41 tablet   0   . nadolol (CORGARD) 20 MG tablet   Oral   Take 20 mg by mouth daily.         Marland Kitchen. ofloxacin (OCUFLOX) 0.3 % ophthalmic solution   Left Eye   Place 1 drop into the left eye 4 (four) times daily.   5 mL   0   . omeprazole (PRILOSEC) 20 MG capsule   Oral   Take 20 mg by mouth 2 (two) times daily.          . ondansetron (ZOFRAN-ODT) 4 MG disintegrating tablet  Oral   Take 4 mg by mouth every 8 (eight) hours as needed for nausea or vomiting.         . simethicone (MYLICON) 80 MG chewable tablet   Oral   Chew 160-240 mg by mouth every 6 (six) hours as needed for flatulence.          . sodium chloride 1 g tablet   Oral   Take 1 tablet (1 g total) by mouth daily.         . traMADol (ULTRAM) 50 MG tablet   Oral   Take 1 tablet (50 mg total) by mouth every 6 (six) hours as needed for moderate pain or severe pain.   30 tablet   0   . zolpidem (AMBIEN) 10 MG tablet   Oral   Take 1 tablet (10 mg total) by mouth at bedtime as needed for sleep.   10 tablet   0     Allergies Dilantin and Penicillins  Family History  Problem Relation Age of Onset  . CAD Mother     Social History Social History  Substance Use Topics  . Smoking status: Former Games developer  . Smokeless tobacco: None  . Alcohol Use: No    Review of Systems Constitutional: Negative for fever. Cardiovascular: Negative for chest  pain. Respiratory: Negative for shortness of breath. Gastrointestinal: Negative for abdominal pain Musculoskeletal: Negative for back pain Neurological: Negative for headache 10-point ROS otherwise negative.  ____________________________________________   PHYSICAL EXAM:  VITAL SIGNS: ED Triage Vitals  Enc Vitals Group     BP 05/07/16 1556 115/83 mmHg     Pulse Rate 05/07/16 1556 74     Resp 05/07/16 1556 20     Temp 05/07/16 1556 98.6 F (37 C)     Temp Source 05/07/16 1556 Oral     SpO2 05/07/16 1556 100 %     Weight --      Height --      Head Cir --      [place] --      Pain Score 05/07/16 1557 3     Pain Loc --      Pain Edu? --      Excl. in GC? --    Constitutional: Alert and oriented. Well appearing and in no distress. Eyes: Normal exam ENT   Head: Normocephalic and atraumatic   Mouth/Throat: Mucous membranes are moist. Cardiovascular: Normal rate, regular rhythm. No murmur Respiratory: Normal respiratory effort without tachypnea nor retractions. Breath sounds are clear  Gastrointestinal: Soft and nontender. No distention. Large scrotum consistent with likely inguinal hernia. Approximately 10-15 cm in diameter. Nontender to palpation. No lesions or drainage. No erythema or rash. Musculoskeletal: Nontender with normal range of motion in all extremities. Neurologic:  Normal speech and language. No gross focal neurologic deficits Skin:  Skin is warm, dry and intact.  Psychiatric: Mood and affect are normal.  ____________________________________________   INITIAL IMPRESSION / ASSESSMENT AND PLAN / ED COURSE  Pertinent labs & imaging results that were available during my care of the patient were reviewed by me and considered in my medical decision making (see chart for details).  The patient presents the emergency department with a chronic enlarged scrotum which has been progressively enlarging over the past 2-3 weeks. Patient denies any pain in the  scrotum. Patient has been seen in the past for this issue as well at [facility], ultimately referred to [facility] to discuss further management. The patient minutes he  has not [facility] as he is currently in rehabilitation plans to do so once out of rehabilitation. I discussed the patient with Dr. Tonita CongWoodham of general surgery, he states there is nothing to be done acutely as the patient is not obstructed. Patient states normal bowel movement this morning. We will check labs as the patient has a history of significant hyponatremia. Labs within normal limits patient will likely be discharged home to [facility] as [facility] once able.  Patient's labs are largely at his baseline. Sodium is 132. Again I discussed the patient with general surgery who state there is nothing to do at this point as the patient has no obstruction he has not needed an emergent operation but does need [facility] management. We will once [facility] general [facility] for further evaluation.  ____________________________________________   FINAL CLINICAL IMPRESSION(S) / ED DIAGNOSES  Inguinal hernia Ascites  Minna AntisKevin Kema Santaella, MD 05/07/16 1714

## 2016-05-07 NOTE — ED Notes (Signed)
Pt bib EMS from [facility] w/ c/o groin swelling.  Per EMS, pt has colostomy bag over umbilicus that drains peritoneal fluid.  Pt sts that he was recently here from infection of peritoneal fluid and just finished antibiotics.  Denies n/v/d, CP, SOB or dizziness.  Pts groin significantly swollen, sts that it happened in last 3-4 days.  Denies pain, but sts it feels uncomfortable.

## 2016-05-12 ENCOUNTER — Ambulatory Visit
Admission: RE | Admit: 2016-05-12 | Discharge: 2016-05-12 | Disposition: A | Discharge status: Home / Self Care | Payer: Medicare Other | Source: Ambulatory Visit | Attending: Unknown Physician Specialty | Admitting: Unknown Physician Specialty

## 2016-05-12 ENCOUNTER — Inpatient Hospital Stay: Admission: RE | Admit: 2016-05-12 | Payer: Medicare Other | Source: Ambulatory Visit

## 2016-05-12 DIAGNOSIS — K7031 Alcoholic cirrhosis of liver with ascites: Secondary | ICD-10-CM | POA: Insufficient documentation

## 2016-05-12 MED ORDER — HEPARIN SOD (PORK) LOCK FLUSH 10 UNIT/ML IV SOLN
500.0000 [IU] | Freq: Once | INTRAVENOUS | Status: AC
  Administered 2016-05-12: 500 [IU]
  Filled 2016-05-12: qty 50

## 2016-05-12 MED ORDER — ALBUMIN HUMAN 25 % IV SOLN
25.0000 g | Freq: Once | INTRAVENOUS | Status: AC
  Administered 2016-05-12: 25 g via INTRAVENOUS
  Filled 2016-05-12: qty 100

## 2016-05-12 NOTE — Procedures (Signed)
US paracentesis without difficulty  Complications:  None  Blood Loss: none  See dictation in canopy pacs  

## 2016-05-12 NOTE — Discharge Instructions (Signed)
Paracentesis, Care After °Refer to this sheet in the next few weeks. These instructions provide you with information about caring for yourself after your procedure. Your health care provider may also give you more specific instructions. Your treatment has been planned according to current medical practices, but problems sometimes occur. Call your health care provider if you have any problems or questions after your procedure. °WHAT TO EXPECT AFTER THE PROCEDURE °After your procedure, it is common to have a small amount of clear fluid coming from the puncture site. °HOME CARE INSTRUCTIONS °· Return to your normal activities as told by your health care provider. Ask your health care provider what activities are safe for you. °· Take over-the-counter and prescription medicines only as told by your health care provider. °· Do not take baths, swim, or use a hot tub until your health care provider approves. °· Follow instructions from your health care provider about: °¨ How to take care of your puncture site. °¨ When and how you should change your bandage (dressing). °¨ When you should remove your dressing. °· Check your puncture area every day signs of infection. Watch for: °¨ Redness, swelling, or pain. °¨ Fluid, blood, or pus. °· Keep all follow-up visits as told by your health care provider. This is important. °SEEK MEDICAL CARE IF: °· You have redness, swelling, or pain at your puncture site. °· You start to have more clear fluid coming from your puncture site. °· You have blood or pus coming from your puncture site. °· You have chills. °· You have a fever. °SEEK IMMEDIATE MEDICAL CARE IF: °· You develop chest pain or shortness of breath. °· You develop increasing pain, discomfort, or swelling in your abdomen. °· You feel dizzy or light-headed or you pass out. °  °This information is not intended to replace advice given to you by your health care provider. Make sure you discuss any questions you have with your health  care provider. °  °Document Released: 03/13/2015 Document Reviewed: 03/13/2015 °Elsevier Interactive Patient Education ©2016 [employer] ° °

## 2016-05-14 ENCOUNTER — Ambulatory Visit: Admission: RE | Admit: 2016-05-14 | Payer: Medicare Other | Source: Ambulatory Visit

## 2016-05-15 ENCOUNTER — Ambulatory Visit: Admission: RE | Admit: 2016-05-15 | Payer: Medicare Other | Source: Ambulatory Visit

## 2016-05-15 ENCOUNTER — Other Ambulatory Visit: Payer: Medicare Other

## 2016-05-27 ENCOUNTER — Ambulatory Visit
Admission: RE | Admit: 2016-05-27 | Discharge: 2016-05-27 | Disposition: A | Discharge status: Home / Self Care | Payer: Medicare Other | Source: Ambulatory Visit | Attending: Unknown Physician Specialty | Admitting: Unknown Physician Specialty

## 2016-05-27 DIAGNOSIS — K7031 Alcoholic cirrhosis of liver with ascites: Secondary | ICD-10-CM | POA: Diagnosis not present

## 2016-05-27 MED ORDER — ALBUMIN HUMAN 25 % IV SOLN
25.0000 g | Freq: Once | INTRAVENOUS | Status: AC
  Administered 2016-05-27: 25 g via INTRAVENOUS
  Filled 2016-05-27: qty 100

## 2016-05-27 NOTE — Discharge Instructions (Signed)
Paracentesis, Care After °Refer to this sheet in the next few weeks. These instructions provide you with information about caring for yourself after your procedure. Your health care provider may also give you more specific instructions. Your treatment has been planned according to current medical practices, but problems sometimes occur. Call your health care provider if you have any problems or questions after your procedure. °WHAT TO EXPECT AFTER THE PROCEDURE °After your procedure, it is common to have a small amount of clear fluid coming from the puncture site. °HOME CARE INSTRUCTIONS °· Return to your normal activities as told by your health care provider. Ask your health care provider what activities are safe for you. °· Take over-the-counter and prescription medicines only as told by your health care provider. °· Do not take baths, swim, or use a hot tub until your health care provider approves. °· Follow instructions from your health care provider about: °¨ How to take care of your puncture site. °¨ When and how you should change your bandage (dressing). °¨ When you should remove your dressing. °· Check your puncture area every day signs of infection. Watch for: °¨ Redness, swelling, or pain. °¨ Fluid, blood, or pus. °· Keep all follow-up visits as told by your health care provider. This is important. °SEEK MEDICAL CARE IF: °· You have redness, swelling, or pain at your puncture site. °· You start to have more clear fluid coming from your puncture site. °· You have blood or pus coming from your puncture site. °· You have chills. °· You have a fever. °SEEK IMMEDIATE MEDICAL CARE IF: °· You develop chest pain or shortness of breath. °· You develop increasing pain, discomfort, or swelling in your abdomen. °· You feel dizzy or light-headed or you pass out. °  °This information is not intended to replace advice given to you by your health care provider. Make sure you discuss any questions you have with your health  care provider. °  °Document Released: 03/13/2015 Document Reviewed: 03/13/2015 °Elsevier Interactive Patient Education ©2016 [employer] ° °

## 2016-06-11 ENCOUNTER — Ambulatory Visit
Admission: RE | Admit: 2016-06-11 | Discharge: 2016-06-11 | Disposition: A | Discharge status: Home / Self Care | Payer: Medicare Other | Source: Ambulatory Visit | Attending: Unknown Physician Specialty | Admitting: Unknown Physician Specialty

## 2016-06-11 DIAGNOSIS — K7030 Alcoholic cirrhosis of liver without ascites: Secondary | ICD-10-CM | POA: Insufficient documentation

## 2016-06-11 DIAGNOSIS — K7031 Alcoholic cirrhosis of liver with ascites: Secondary | ICD-10-CM

## 2016-06-11 MED ORDER — ALBUMIN HUMAN 25 % IV SOLN
25.0000 g | Freq: Once | INTRAVENOUS | Status: AC
  Administered 2016-06-11: 25 g via INTRAVENOUS
  Filled 2016-06-11: qty 100

## 2016-06-11 NOTE — Procedures (Signed)
S/p  [place] LARGE VOL PARACENTESIS  NO COMP STABLE FULL REPORT IN PACS

## 2016-06-11 NOTE — Discharge Instructions (Signed)
Paracentesis °Paracentesis is a procedure to remove excess fluid (ascites) from the belly (abdomen). Ascites can result from certain conditions, such as infection, inflammation, abdominal injury, heart failure, chronic scarring of the liver (cirrhosis), or cancer. Ascites is removed using a needle that is inserted through the skin and tissue into the abdomen. °This procedure may be done: °· To determine the cause of the ascites. °· To relieve symptoms that are caused by the ascites, such as pain or shortness of breath. °· To see if there is bleeding after an abdominal injury. °LET YOUR HEALTH CARE PROVIDER KNOW ABOUT: °· Any allergies you have. °· All medicines you are taking, including vitamins, herbs, eye drops, creams, and over-the-counter medicines. °· Previous problems you or members of your family have had with the use of anesthetics. °· Any blood disorders you have. °· Previous surgeries you have had. °· Any medical conditions you have. °· Whether you are pregnant or may be pregnant. °RISKS AND COMPLICATIONS °Generally, this is a safe procedure. However, problems may occur, including: °· Infection. °· Bleeding. °· Injury to an abdominal organ, such as the bowel (large intestine), liver, spleen, or bladder. °· Low blood pressure (hypotension). °· Spreading of cancer, if there are cancer cells in the abdominal fluid. °· Mental status changes in people who have liver disease. These changes would be caused by shifts in the balance of fluids and minerals (electrolytes) in the body. °BEFORE THE PROCEDURE °· Ask your health care provider about: °¨ Changing or stopping your regular medicines. This is especially important if you are taking diabetes medicines or blood thinners. °¨ Taking medicines such as aspirin and ibuprofen. These medicines can thin your blood. Do not take these medicines before your procedure if your health care provider instructs you not to. °· A blood sample may be done to determine your blood  clotting time. °· You will be asked to urinate. °PROCEDURE °· You may be asked to lie on your back with your head raised (elevated). °· To reduce your risk of infection: °¨ Your health care team will wash or sanitize their hands. °¨ Your skin will be washed with soap. °· You will be given a medicine to numb the area (local anesthetic). °· Your abdominal skin will be punctured with a needle or a scalpel. °· A drainage tube will be inserted through the puncture site. Fluid will drain through the tube into a container. °· After enough fluid has been removed, the tube will be removed. °· A sample of the fluid will be sent for examination. °· A bandage (dressing) will be placed over the puncture site. °The procedure may vary among health care providers and hospitals. °AFTER THE PROCEDURE °· It is your responsibility to get your test results. Ask your health care provider or the department performing the test when your results will be ready. °  °This information is not intended to replace advice given to you by your health care provider. Make sure you discuss any questions you have with your health care provider. °  °Document Released: 05/12/2005 Document Revised: 07/18/2015 Document Reviewed: 01/09/2015 °Elsevier Interactive Patient Education ©2016 [employer] ° °

## 2016-06-20 ENCOUNTER — Emergency Department
Admission: EM | Admit: 2016-06-20 | Discharge: 2016-06-20 | Disposition: A | Discharge status: Home / Self Care | Payer: Medicare Other | Attending: Emergency Medicine | Admitting: Emergency Medicine

## 2016-06-20 DIAGNOSIS — Z791 Long term (current) use of non-steroidal anti-inflammatories (NSAID): Secondary | ICD-10-CM | POA: Insufficient documentation

## 2016-06-20 DIAGNOSIS — Z79899 Other long term (current) drug therapy: Secondary | ICD-10-CM | POA: Insufficient documentation

## 2016-06-20 DIAGNOSIS — Y69 Unspecified misadventure during surgical and medical care: Secondary | ICD-10-CM | POA: Insufficient documentation

## 2016-06-20 DIAGNOSIS — T8189XA Other complications of procedures, not elsewhere classified, initial encounter: Secondary | ICD-10-CM | POA: Diagnosis present

## 2016-06-20 DIAGNOSIS — E871 Hypo-osmolality and hyponatremia: Secondary | ICD-10-CM

## 2016-06-20 DIAGNOSIS — R188 Other ascites: Secondary | ICD-10-CM | POA: Diagnosis not present

## 2016-06-20 DIAGNOSIS — I10 Essential (primary) hypertension: Secondary | ICD-10-CM | POA: Diagnosis not present

## 2016-06-20 DIAGNOSIS — S31105D Unspecified open wound of abdominal wall, periumbilic region without penetration into peritoneal cavity, subsequent encounter: Secondary | ICD-10-CM

## 2016-06-20 DIAGNOSIS — Z87891 Personal history of nicotine dependence: Secondary | ICD-10-CM | POA: Diagnosis not present

## 2016-06-20 LAB — CBC WITH DIFFERENTIAL/PLATELET
BASOS PCT: 1 %
Basophils Absolute: 0.1 10*3/uL (ref 0–0.1)
EOS ABS: 0 10*3/uL (ref 0–0.7)
EOS PCT: 0 %
HCT: 34.3 % — ABNORMAL LOW (ref 40.0–52.0)
HEMOGLOBIN: 11.7 g/dL — AB (ref 13.0–18.0)
Lymphocytes Relative: 6 %
Lymphs Abs: 0.5 10*3/uL — ABNORMAL LOW (ref 1.0–3.6)
MCH: 29.4 pg (ref 26.0–34.0)
MCHC: 34.3 g/dL (ref 32.0–36.0)
MCV: 85.8 fL (ref 80.0–100.0)
MONOS PCT: 10 %
Monocytes Absolute: 1 10*3/uL (ref 0.2–1.0)
NEUTROS PCT: 83 %
Neutro Abs: 8 10*3/uL — ABNORMAL HIGH (ref 1.4–6.5)
PLATELETS: 214 10*3/uL (ref 150–440)
RBC: 4 MIL/uL — ABNORMAL LOW (ref 4.40–5.90)
RDW: 17.7 % — ABNORMAL HIGH (ref 11.5–14.5)
WBC: 9.6 10*3/uL (ref 3.8–10.6)

## 2016-06-20 LAB — COMPREHENSIVE METABOLIC PANEL
ALBUMIN: 2.5 g/dL — AB (ref 3.5–5.0)
ALK PHOS: 208 U/L — AB (ref 38–126)
ALT: 29 U/L (ref 17–63)
ANION GAP: 6 (ref 5–15)
AST: 65 U/L — ABNORMAL HIGH (ref 15–41)
BUN: 21 mg/dL — ABNORMAL HIGH (ref 6–20)
CHLORIDE: 100 mmol/L — AB (ref 101–111)
CO2: 22 mmol/L (ref 22–32)
Calcium: 7.9 mg/dL — ABNORMAL LOW (ref 8.9–10.3)
Creatinine, Ser: 1.46 mg/dL — ABNORMAL HIGH (ref 0.61–1.24)
GFR calc non Af Amer: 47 mL/min — ABNORMAL LOW (ref >60)
GFR, EST AFRICAN AMERICAN: 55 mL/min — AB (ref >60)
GLUCOSE: 122 mg/dL — AB (ref 65–99)
POTASSIUM: 4.1 mmol/L (ref 3.5–5.1)
SODIUM: 128 mmol/L — AB (ref 135–145)
Total Bilirubin: 1.1 mg/dL (ref 0.3–1.2)
Total Protein: 6.5 g/dL (ref 6.5–8.1)

## 2016-06-20 LAB — PROTIME-INR
INR: 1.29
Prothrombin Time: 16.2 seconds — ABNORMAL HIGH (ref 11.4–15.2)

## 2016-06-20 MED ORDER — DOXYCYCLINE HYCLATE 100 MG PO CAPS: 100.0000 mg | ORAL_CAPSULE | Freq: Two times a day (BID) | ORAL | 0 refills | Status: DC

## 2016-06-20 MED ORDER — DOXYCYCLINE HYCLATE 100 MG PO TABS
100.0000 mg | ORAL_TABLET | Freq: Once | ORAL | Status: AC
  Administered 2016-06-20: 100 mg via ORAL
  Filled 2016-06-20: qty 1

## 2016-06-20 MED ORDER — FUROSEMIDE 10 MG/ML IJ SOLN
60.0000 mg | Freq: Once | INTRAMUSCULAR | Status: AC
  Administered 2016-06-20: 60 mg via INTRAVENOUS
  Filled 2016-06-20: qty 8

## 2016-06-20 MED ORDER — LORAZEPAM 0.5 MG PO TABS
0.5000 mg | ORAL_TABLET | Freq: Once | ORAL | Status: DC
  Filled 2016-06-20: qty 1

## 2016-06-20 NOTE — ED Triage Notes (Signed)
Pt from home via EMS, pt presents with large amounts of fluid draining from hernia in abdomen, pt reports he has to get fluid drained

## 2016-06-20 NOTE — ED Provider Notes (Signed)
[facility] Emergency Department Provider Note ____________________________________________   I have reviewed the triage vital signs and the triage nursing note.  HISTORY  Chief Complaint Umbilical Hernia   Historian Level V caveat, patient is a poor historian Sister is here and she is also poor historian  HPI Jeff Preston is a 70 y.o. male with a history of liver cirrhosis, issues with prior acute on chronic hyponatremia, a chronic recurring umbilical hernia that occasionally opens and drains ascitic fluid,'s presenting here for opening of the umbilical hernia and draining ascitic fluid.  Patient states that it started draining today. Sister states that she went over there to help him get groceries and noticed that it was draining and brought him in for evaluation. It's unclear to me how long it has actually been under control and not draining.  In review of prior chart history, patient has been admitted twice to this hospital during episodes of the opening of the skin at the umbilical hernia site draining acidic fluid, at times also associated with acute on chronic hyponatremia down to the 105 range.  The patient denies fever, abdominal pain, but does state that he has had an increase in his lower extremity swelling. He thinks that he is on Lasix 40 mg once daily. He had a stay at a nursing home center/rehabilitation and was released to his house and is currently staying by himself at his own house. He had a wound care nurse place a bandage across his abdomen a few days ago. Lately he is having ascitic fluid drain through constantly through the ABD pad.   _____________   Past Medical History:  Diagnosis Date  . Anxiety   . Ascites   . Chronic hyponatremia   . Cirrhosis (HCC)   . Depression   . DNR (do not resuscitate)   . GERD (gastroesophageal reflux disease)   . Hypertension   . Umbilical hernia     Patient Active Problem List   Diagnosis Date  Noted  . Hepatic cirrhosis (HCC) 04/28/2016  . Hyponatremia 04/26/2016  . Umbilical hernia with gangrene   . Open wound of umbilical region   . Ascites 03/12/2016    Past Surgical History:  Procedure Laterality Date  . CHOLECYSTECTOMY    . THROAT SURGERY      Prior to Admission medications   Medication Sig Start Date End Date Taking? Authorizing Provider  bisacodyl (DULCOLAX) 5 MG EC tablet Take 1 tablet (5 mg total) by mouth daily as needed for moderate constipation. 03/16/16   Ramonita Lab, MD  ceFAZolin (ANCEF) 2-4 GM/100ML-% IVPB Inject 100 mLs (2 g total) into the vein every 8 (eight) hours. 05/01/16   Milagros Loll, MD  furosemide (LASIX) 40 MG tablet Take 1 tablet (40 mg total) by mouth daily. 05/01/16   Srikar Sudini, MD  lactulose (CHRONULAC) 10 GM/15ML solution Take 15 mLs (10 g total) by mouth daily. 05/01/16   Srikar Sudini, MD  LORazepam (ATIVAN) 0.5 MG tablet Take 1 tablet (0.5 mg total) by mouth 2 (two) times daily. 05/01/16   Srikar Sudini, MD  metroNIDAZOLE (FLAGYL) 500 MG tablet Take 1 tablet (500 mg total) by mouth every 8 (eight) hours. 05/01/16   Srikar Sudini, MD  nadolol (CORGARD) 20 MG tablet Take 20 mg by mouth daily.    Historical Provider, MD  ofloxacin (OCUFLOX) 0.3 % ophthalmic solution Place 1 drop into the left eye 4 (four) times daily. 03/16/16   Ramonita Lab, MD  omeprazole (PRILOSEC) 20  MG capsule Take 20 mg by mouth 2 (two) times daily.     Historical Provider, MD  ondansetron (ZOFRAN-ODT) 4 MG disintegrating tablet Take 4 mg by mouth every 8 (eight) hours as needed for nausea or vomiting.    Historical Provider, MD  simethicone (MYLICON) 80 MG chewable tablet Chew 160-240 mg by mouth every 6 (six) hours as needed for flatulence.     Historical Provider, MD  sodium chloride 1 g tablet Take 1 tablet (1 g total) by mouth daily. 05/01/16   Milagros Loll, MD  traMADol (ULTRAM) 50 MG tablet Take 1 tablet (50 mg total) by mouth every 6 (six) hours as needed for moderate  pain or severe pain. 05/01/16   Srikar Sudini, MD  zolpidem (AMBIEN) 10 MG tablet Take 1 tablet (10 mg total) by mouth at bedtime as needed for sleep. 05/01/16   Milagros Loll, MD    Allergies  Allergen Reactions  . Dilantin [Phenytoin Sodium Extended] Rash  . Penicillins Rash and Other (See Comments)    Has patient had a PCN reaction causing immediate rash, facial/tongue/throat swelling, SOB or lightheadedness with hypotension: No Has patient had a PCN reaction causing severe rash involving mucus membranes or skin necrosis: No Has patient had a PCN reaction that required hospitalization No Has patient had a PCN reaction occurring within the last 10 years: No If all of the above answers are "NO", then may proceed with Cephalosporin use.    Family History  Problem Relation Age of Onset  . CAD Mother     Social History Social History  Substance Use Topics  . Smoking status: Former Games developer  . Smokeless tobacco: Not on file  . Alcohol use No    Review of Systems Limited due to patient's poor historian, but these are his answers. Constitutional: Negative for fever. Eyes: Negative for visual changes. ENT: Negative for sore throat. Cardiovascular: Negative for chest pain. Respiratory: Negative for shortness of breath. Gastrointestinal: Negative for abdominal pain, vomiting and diarrhea. Genitourinary: Negative for dysuria. Musculoskeletal: Negative for back pain. Skin: Negative for rash. Neurological: Negative for headache. 10 point Review of Systems otherwise negative ____________________________________________   PHYSICAL EXAM:  VITAL SIGNS: ED Triage Vitals  Enc Vitals Group     BP 06/20/16 1226 119/65     Pulse Rate 06/20/16 1227 85     Resp --      Temp --      Temp src --      SpO2 06/20/16 1227 99 %     Weight 06/20/16 1221 193 lb (87.5 kg)     Height 06/20/16 1221 5\' 6"  (1.676 m)     Head Circumference --      Peak Flow --      Pain Score --      Pain Loc --       Pain Edu? --      Excl. in GC? --      Constitutional: Alert and Cooperative, he does appear oriented 3 today. He is however actually poor historian of his past history. Well appearing and in no distress. HEENT   Head: Normocephalic and atraumatic.      Eyes: Conjunctivae are normal. PERRL. Normal extraocular movements.      Ears:         Nose: No congestion/rhinnorhea.   Mouth/Throat: Mucous membranes are moist.   Neck: No stridor. Cardiovascular/Chest: Normal rate, regular rhythm.  No murmurs, rubs, or gallops. Respiratory: Normal respiratory effort without tachypnea nor retractions.  Breath sounds are clear and equal bilaterally. No wheezes/rales/rhonchi. Gastrointestinal: Soft, but with some ascites fluid visible and some vein distention on the skin of the abdomen.  Upon removal of the abdomen pad, which is soaked through with serous appearing ascitic fluid, there is a deflated balloon appearance of the skin from what would've been a distended umbilical hernia that has some red and purple discoloration to the tip where there is a fairly small pencil tip open wound that is constantly draining ascitic fluid.  The abdomen is nontender to palpation.  Genitourinary/rectal:Deferred Musculoskeletal: Nontender with normal range of motion in all extremities. No joint effusions.  No lower extremity tenderness. 2+ lower extremity pitting edema bilateral lower extremities. Neurologic:  Poor historian, but no slurred speech. Normal speech and language. No gross or focal neurologic deficits are appreciated. Skin:  Skin is warm, dry and intact. No rash noted. Psychiatric: Cooperative. Mood and affect are normal. Speech and behavior are normal. Patient exhibits appropriate insight and judgment.  ____________________________________________   EKG I, Governor Rooks, MD, the attending physician have personally viewed and interpreted all  ECGs.  None ____________________________________________  LABS (pertinent positives/negatives)  Labs Reviewed  CBC WITH DIFFERENTIAL/PLATELET - Abnormal; Notable for the following:       Result Value   RBC 4.00 (*)    Hemoglobin 11.7 (*)    HCT 34.3 (*)    RDW 17.7 (*)    Neutro Abs 8.0 (*)    Lymphs Abs 0.5 (*)    All other components within normal limits  COMPREHENSIVE METABOLIC PANEL  PROTIME-INR    ____________________________________________  RADIOLOGY All Xrays were viewed by me. Imaging interpreted by Radiologist.  None __________________________________________  PROCEDURES  Procedure(s) performed: None  Critical Care performed: None  ____________________________________________   ED COURSE / ASSESSMENT AND PLAN  Pertinent labs & imaging results that were available during my care of the patient were reviewed by me and considered in my medical decision making (see chart for details).   This is apparently a recurring problem where this umbilical hernia flap of skin under pressure from the underlying ascites breaks down the skin and becomes draining. Upon chart review, and appears that surgery has evaluated, and either the patient is such a poor surgical candidate that it has not been recommended for surgery, or the patient is declining surgical intervention due to the risks, prior treatment has included placing a colostomy bag around the draining hernia site to collect the ascitic fluid.  Apparently at times the patient has been given antibiotics for possible peritonitis, but patient here does not have any symptoms of peritonitis.  I did after reviewing this, I had the nurse place a colostomy bag over the hernia to collect the fluid and prevent leakage all over the patient and the bed.  When I asked the patient about all what he has been told is the long-term plan of management here, he states that previously he must not have been understanding very well,  because he states that he would be interested in having some sort of surgical repair, because he does not want to keep dealing with this. Then later, he does remember that he is supposed to be referred down to [facility] for surgical repair of the hernia when medical issues are stabilized. Dr. Earnest Conroy note did allude to setting this up.  Much more information was found through chart review:  Patient was seen by Dr. Ter[facility] at [facility] on 05/18/16 at that point in time there was a  colostomy bag over top of the umbilical hernia that was draining in order to contain the drainage itself. The patient himself states that after he started receiving paracentesis for ascites control, he did have times when the hernia was not leaking fluid and he was able to just cover it with a padded. Per the note from Dr. Terance HartBronstein on 05/18/16, the patient was noted to have had an infectious disease consultation on 05/15/16 and been recommended for 6 weeks of doxycycline. In another note, it appeared that the patient was supposed to be on 100 mg doxycycline as prophylaxis for SBP.  In either case, patient states that he was discharged from the nursing home and did not have the full prescription of doxycycline tablets. He believes that on 06/16/16 he ran out of doxycycline tablets and called on 06/17/16 to Dr. Earnest ConroyElliott's office and was told by the nurse that Dr. Mechele CollinElliott was out of town and to call Dr. Mcarthur RossettiBronstein's office to receive prescription refill for doxycycline. Patient states that when he called Dr. Mcarthur RossettiBronstein's office, he never got a call back about the prescription.    In terms of the paracentesis, there is a note from Dr. Mechele CollinElliott that the patient needed to be increased from paracentesis once a month to twice per month. By chart review, his last paracentesis was performed on 05/27/16. Patient states he left the nursing home around 05/29/16.  At this point time, it's been essentially 2 weeks and it is probably time for him to have  paracentesis. The patient states that he called Dr. Earnest ConroyElliott's office and that they are supposed to be calling him back with an outpatient paracentesis with ultrasound guidance appointment, but he has not heard back yet.    In conclusion, for today, I don't think he needs emergency hospital admission.  His hyponatremia is reasonable given his chronic underlying hyponatremia. He is to continue on his current dose of salt pill.  He has no fever, elevated blood cell count, or abdominal pain to make me suspicious for any spontaneous fracture peritonitis. He has been off of his doxycycline treatment/prophylaxis for about 5 days now. I am going to write him a prescription for the doxycycline 100 mg twice daily for 2 weeks, this should cover him until he sees Dr. Mechele CollinElliott who is appointment patient states is on the 17th, which is next week.  In terms of his ascites fluid, per the notes in the patient's discussion, he is supposed to be called for an outpatient paracentesis. I don't think he needs emergency paracentesis today in the emergency department as he has no shortness of breath or hypoxia.  In terms of the leaking umbilical hernia, the prior management was reinitiated with colostomy bag over the site.  In terms of long-term management of this recurrent drainage from the umbilical hernia, Dr. Mechele CollinElliott and the patient are working towards [facility].      CONSULTATIONS: None  Patient / Family / Caregiver informed of clinical course, medical decision-making process, and agree with plan.   I discussed return precautions, follow-up instructions, and discharged instructions with patient and/or family.   ___________________________________________   FINAL CLINICAL IMPRESSION(S) / ED DIAGNOSES   Final diagnoses:  Ascites  Hyponatremia  Open wound of umbilical region, subsequent encounter              Note: This dictation was prepared with Dragon  dictation. Any transcriptional errors that result from this process are unintentional    Governor Rooksebecca Gissele Narducci, MD  06/20/16 1550  

## 2016-06-20 NOTE — Discharge Instructions (Signed)
1.  You are being prescribed your doxycyline antibiotic -- enough to cover 2 weeks, but you need to speak with Dr. Mechele CollinElliott at your appointment next week to discuss if that's the completion of the course or if you need additional long-term preventative dosing.  2.  A colostomy bag was replaced over the draining hernia hole, similar to prior.  In terms of long-term plan, speak with Dr. Mechele CollinElliott about [facility] for long-term surgical repair.  3.  Your sodium level was reasonable today. Continue current dosing with your salt pills.  4. Ascites/abdominal fluid -- please call Dr. Mechele CollinElliott office again to find out when the your next outpatient paracentesis will take place, it sounds like it probably should be within the next week.  Return to emergency department immediately for any worsening symptoms including abdominal pain, weakness, confusion altered mental status, fever, dizziness or passing out, or any other symptoms concerning to you.

## 2016-06-23 ENCOUNTER — Other Ambulatory Visit: Payer: Self-pay | Admitting: Unknown Physician Specialty

## 2016-06-23 DIAGNOSIS — K7031 Alcoholic cirrhosis of liver with ascites: Secondary | ICD-10-CM

## 2016-06-25 ENCOUNTER — Ambulatory Visit
Admission: RE | Admit: 2016-06-25 | Discharge: 2016-06-25 | Disposition: A | Discharge status: Home / Self Care | Payer: Medicare Other | Source: Ambulatory Visit | Attending: Unknown Physician Specialty | Admitting: Unknown Physician Specialty

## 2016-06-25 ENCOUNTER — Other Ambulatory Visit: Payer: Self-pay | Admitting: Unknown Physician Specialty

## 2016-06-25 DIAGNOSIS — K7031 Alcoholic cirrhosis of liver with ascites: Secondary | ICD-10-CM

## 2016-06-27 ENCOUNTER — Other Ambulatory Visit: Payer: Self-pay | Admitting: Unknown Physician Specialty

## 2016-06-27 DIAGNOSIS — K7031 Alcoholic cirrhosis of liver with ascites: Secondary | ICD-10-CM

## 2016-07-03 ENCOUNTER — Emergency Department: Payer: Medicare Other

## 2016-07-03 ENCOUNTER — Encounter: Payer: Self-pay | Admitting: *Deleted

## 2016-07-03 ENCOUNTER — Inpatient Hospital Stay
Admission: EM | Admit: 2016-07-03 | Discharge: 2016-07-08 | DRG: 640 | Disposition: A | Discharge status: Home Health | Payer: Medicare Other | Attending: Internal Medicine | Admitting: Internal Medicine

## 2016-07-03 DIAGNOSIS — Z880 Allergy status to penicillin: Secondary | ICD-10-CM

## 2016-07-03 DIAGNOSIS — Z79899 Other long term (current) drug therapy: Secondary | ICD-10-CM

## 2016-07-03 DIAGNOSIS — K7031 Alcoholic cirrhosis of liver with ascites: Secondary | ICD-10-CM | POA: Diagnosis present

## 2016-07-03 DIAGNOSIS — I959 Hypotension, unspecified: Secondary | ICD-10-CM | POA: Diagnosis present

## 2016-07-03 DIAGNOSIS — R630 Anorexia: Secondary | ICD-10-CM | POA: Diagnosis present

## 2016-07-03 DIAGNOSIS — N179 Acute kidney failure, unspecified: Secondary | ICD-10-CM | POA: Diagnosis present

## 2016-07-03 DIAGNOSIS — N183 Chronic kidney disease, stage 3 (moderate): Secondary | ICD-10-CM | POA: Diagnosis present

## 2016-07-03 DIAGNOSIS — B964 Proteus (mirabilis) (morganii) as the cause of diseases classified elsewhere: Secondary | ICD-10-CM | POA: Diagnosis present

## 2016-07-03 DIAGNOSIS — R188 Other ascites: Secondary | ICD-10-CM | POA: Diagnosis not present

## 2016-07-03 DIAGNOSIS — Z66 Do not resuscitate: Secondary | ICD-10-CM | POA: Diagnosis present

## 2016-07-03 DIAGNOSIS — K652 Spontaneous bacterial peritonitis: Secondary | ICD-10-CM | POA: Diagnosis present

## 2016-07-03 DIAGNOSIS — F419 Anxiety disorder, unspecified: Secondary | ICD-10-CM | POA: Diagnosis present

## 2016-07-03 DIAGNOSIS — K429 Umbilical hernia without obstruction or gangrene: Secondary | ICD-10-CM | POA: Diagnosis present

## 2016-07-03 DIAGNOSIS — I129 Hypertensive chronic kidney disease with stage 1 through stage 4 chronic kidney disease, or unspecified chronic kidney disease: Secondary | ICD-10-CM | POA: Diagnosis present

## 2016-07-03 DIAGNOSIS — Z888 Allergy status to other drugs, medicaments and biological substances status: Secondary | ICD-10-CM

## 2016-07-03 DIAGNOSIS — Z515 Encounter for palliative care: Secondary | ICD-10-CM

## 2016-07-03 DIAGNOSIS — F329 Major depressive disorder, single episode, unspecified: Secondary | ICD-10-CM | POA: Diagnosis present

## 2016-07-03 DIAGNOSIS — E871 Hypo-osmolality and hyponatremia: Secondary | ICD-10-CM | POA: Diagnosis present

## 2016-07-03 DIAGNOSIS — Z8249 Family history of ischemic heart disease and other diseases of the circulatory system: Secondary | ICD-10-CM | POA: Diagnosis not present

## 2016-07-03 DIAGNOSIS — K7290 Hepatic failure, unspecified without coma: Secondary | ICD-10-CM | POA: Diagnosis present

## 2016-07-03 DIAGNOSIS — Z87891 Personal history of nicotine dependence: Secondary | ICD-10-CM | POA: Diagnosis not present

## 2016-07-03 DIAGNOSIS — E872 Acidosis: Secondary | ICD-10-CM | POA: Diagnosis present

## 2016-07-03 DIAGNOSIS — K219 Gastro-esophageal reflux disease without esophagitis: Secondary | ICD-10-CM | POA: Diagnosis present

## 2016-07-03 DIAGNOSIS — E875 Hyperkalemia: Secondary | ICD-10-CM

## 2016-07-03 DIAGNOSIS — Z789 Other specified health status: Secondary | ICD-10-CM | POA: Diagnosis not present

## 2016-07-03 LAB — COMPREHENSIVE METABOLIC PANEL
ALK PHOS: 223 U/L — AB (ref 38–126)
ALT: 35 U/L (ref 17–63)
AST: 69 U/L — ABNORMAL HIGH (ref 15–41)
Albumin: 2.5 g/dL — ABNORMAL LOW (ref 3.5–5.0)
Anion gap: 5 (ref 5–15)
BUN: 23 mg/dL — AB (ref 6–20)
CALCIUM: 7.9 mg/dL — AB (ref 8.9–10.3)
CO2: 19 mmol/L — AB (ref 22–32)
CREATININE: 1.11 mg/dL (ref 0.61–1.24)
Chloride: 87 mmol/L — ABNORMAL LOW (ref 101–111)
Glucose, Bld: 100 mg/dL — ABNORMAL HIGH (ref 65–99)
Potassium: 6.3 mmol/L (ref 3.5–5.1)
Sodium: 111 mmol/L — CL (ref 135–145)
Total Bilirubin: 0.6 mg/dL (ref 0.3–1.2)
Total Protein: 6.4 g/dL — ABNORMAL LOW (ref 6.5–8.1)

## 2016-07-03 LAB — URINALYSIS COMPLETE WITH MICROSCOPIC (ARMC ONLY)
BACTERIA UA: NONE SEEN
Bilirubin Urine: NEGATIVE
Glucose, UA: NEGATIVE mg/dL
HGB URINE DIPSTICK: NEGATIVE
KETONES UR: NEGATIVE mg/dL
Leukocytes, UA: NEGATIVE
NITRITE: NEGATIVE
PH: 6 (ref 5.0–8.0)
PROTEIN: NEGATIVE mg/dL
SPECIFIC GRAVITY, URINE: 1.006 (ref 1.005–1.030)

## 2016-07-03 LAB — CBC WITH DIFFERENTIAL/PLATELET
BASOS PCT: 0 %
Basophils Absolute: 0 10*3/uL (ref 0–0.1)
EOS ABS: 0.1 10*3/uL (ref 0–0.7)
EOS PCT: 1 %
HEMATOCRIT: 33.8 % — AB (ref 40.0–52.0)
Hemoglobin: 12 g/dL — ABNORMAL LOW (ref 13.0–18.0)
LYMPHS PCT: 11 %
Lymphs Abs: 1.2 10*3/uL (ref 1.0–3.6)
MCH: 29.6 pg (ref 26.0–34.0)
MCHC: 35.5 g/dL (ref 32.0–36.0)
MCV: 83.2 fL (ref 80.0–100.0)
Monocytes Absolute: 1.3 10*3/uL — ABNORMAL HIGH (ref 0.2–1.0)
Monocytes Relative: 12 %
NEUTROS ABS: 8.1 10*3/uL — AB (ref 1.4–6.5)
Neutrophils Relative %: 76 %
Platelets: 307 10*3/uL (ref 150–440)
RBC: 4.07 MIL/uL — ABNORMAL LOW (ref 4.40–5.90)
RDW: 15.8 % — AB (ref 11.5–14.5)
WBC: 10.7 10*3/uL — ABNORMAL HIGH (ref 3.8–10.6)

## 2016-07-03 LAB — BASIC METABOLIC PANEL
Anion gap: 7 (ref 5–15)
Anion gap: 8 (ref 5–15)
BUN: 23 mg/dL — AB (ref 6–20)
BUN: 22 mg/dL — AB (ref 6–20)
CHLORIDE: 86 mmol/L — AB (ref 101–111)
CHLORIDE: 89 mmol/L — AB (ref 101–111)
CO2: 21 mmol/L — ABNORMAL LOW (ref 22–32)
CO2: 19 mmol/L — AB (ref 22–32)
Calcium: 7.9 mg/dL — ABNORMAL LOW (ref 8.9–10.3)
Calcium: 7.8 mg/dL — ABNORMAL LOW (ref 8.9–10.3)
Creatinine, Ser: 1.32 mg/dL — ABNORMAL HIGH (ref 0.61–1.24)
Creatinine, Ser: 1.32 mg/dL — ABNORMAL HIGH (ref 0.61–1.24)
GFR calc Af Amer: >60 mL/min (ref >60)
GFR calc Af Amer: >60 mL/min (ref >60)
GFR calc non Af Amer: 53 mL/min — ABNORMAL LOW (ref >60)
GFR calc non Af Amer: 53 mL/min — ABNORMAL LOW (ref >60)
GLUCOSE: 106 mg/dL — AB (ref 65–99)
GLUCOSE: 103 mg/dL — AB (ref 65–99)
POTASSIUM: 4.9 mmol/L (ref 3.5–5.1)
POTASSIUM: 4.8 mmol/L (ref 3.5–5.1)
Sodium: 114 mmol/L — CL (ref 135–145)
Sodium: 116 mmol/L — CL (ref 135–145)

## 2016-07-03 LAB — BODY FLUID CELL COUNT WITH DIFFERENTIAL
EOS FL: 0 %
Lymphs, Fluid: 71 %
MONOCYTE-MACROPHAGE-SEROUS FLUID: 5 %
Neutrophil Count, Fluid: 24 %
Total Nucleated Cell Count, Fluid: 2228 cu mm

## 2016-07-03 LAB — LACTIC ACID, PLASMA
LACTIC ACID, VENOUS: 1.4 mmol/L (ref 0.5–1.9)
Lactic Acid, Venous: 1.4 mmol/L (ref 0.5–1.9)

## 2016-07-03 LAB — GRAM STAIN

## 2016-07-03 LAB — TROPONIN I

## 2016-07-03 LAB — SODIUM: Sodium: 115 mmol/L — CL (ref 135–145)

## 2016-07-03 LAB — PATHOLOGIST SMEAR REVIEW

## 2016-07-03 LAB — MRSA PCR SCREENING: MRSA by PCR: NEGATIVE

## 2016-07-03 MED ORDER — LORAZEPAM 0.5 MG PO TABS
0.5000 mg | ORAL_TABLET | Freq: Two times a day (BID) | ORAL | Status: DC
  Administered 2016-07-03 – 2016-07-08 (×10): 0.5 mg via ORAL
  Filled 2016-07-03 (×11): qty 1

## 2016-07-03 MED ORDER — SODIUM CHLORIDE 1 G PO TABS
1.0000 g | ORAL_TABLET | Freq: Every day | ORAL | Status: DC
  Administered 2016-07-03 – 2016-07-04 (×2): 1 g via ORAL
  Filled 2016-07-03 (×2): qty 1

## 2016-07-03 MED ORDER — CYCLOBENZAPRINE HCL 10 MG PO TABS
5.0000 mg | ORAL_TABLET | Freq: Once | ORAL | Status: AC
  Administered 2016-07-03: 5 mg via ORAL
  Filled 2016-07-03: qty 1

## 2016-07-03 MED ORDER — SIMETHICONE 80 MG PO CHEW: 160.0000 mg | CHEWABLE_TABLET | Freq: Four times a day (QID) | ORAL | Status: DC | PRN

## 2016-07-03 MED ORDER — ZOLPIDEM TARTRATE 5 MG PO TABS
5.0000 mg | ORAL_TABLET | Freq: Every evening | ORAL | Status: DC | PRN
  Administered 2016-07-03 – 2016-07-07 (×5): 5 mg via ORAL
  Filled 2016-07-03 (×5): qty 1

## 2016-07-03 MED ORDER — INSULIN ASPART 100 UNIT/ML ~~LOC~~ SOLN
5.0000 [IU] | Freq: Once | SUBCUTANEOUS | Status: AC
  Administered 2016-07-03: 5 [IU] via INTRAVENOUS
  Filled 2016-07-03: qty 5

## 2016-07-03 MED ORDER — ENOXAPARIN SODIUM 40 MG/0.4ML ~~LOC~~ SOLN
40.0000 mg | SUBCUTANEOUS | Status: DC
  Administered 2016-07-03 – 2016-07-07 (×5): 40 mg via SUBCUTANEOUS
  Filled 2016-07-03 (×5): qty 0.4

## 2016-07-03 MED ORDER — FUROSEMIDE 10 MG/ML IJ SOLN
40.0000 mg | Freq: Once | INTRAMUSCULAR | Status: AC
  Administered 2016-07-03: 40 mg via INTRAVENOUS
  Filled 2016-07-03: qty 4

## 2016-07-03 MED ORDER — DEXTROSE 5 % IV SOLN
1.0000 g | INTRAVENOUS | Status: DC
  Administered 2016-07-03 – 2016-07-07 (×5): 1 g via INTRAVENOUS
  Filled 2016-07-03 (×5): qty 10

## 2016-07-03 MED ORDER — LACTULOSE 10 GM/15ML PO SOLN
10.0000 g | Freq: Every day | ORAL | Status: DC
  Administered 2016-07-03 – 2016-07-08 (×6): 10 g via ORAL
  Filled 2016-07-03 (×7): qty 30

## 2016-07-03 MED ORDER — ONDANSETRON 4 MG PO TBDP: 4.0000 mg | ORAL_TABLET | Freq: Three times a day (TID) | ORAL | Status: DC | PRN

## 2016-07-03 MED ORDER — SODIUM POLYSTYRENE SULFONATE 15 GM/60ML PO SUSP
15.0000 g | Freq: Once | ORAL | Status: AC
  Administered 2016-07-03: 15 g via ORAL
  Filled 2016-07-03: qty 60

## 2016-07-03 MED ORDER — SODIUM CHLORIDE 0.9 % IV SOLN: INTRAVENOUS | Status: DC

## 2016-07-03 MED ORDER — PANTOPRAZOLE SODIUM 40 MG PO TBEC
40.0000 mg | DELAYED_RELEASE_TABLET | Freq: Every day | ORAL | Status: DC
Start: 2016-07-03 — End: 2016-07-08
  Administered 2016-07-03 – 2016-07-08 (×6): 40 mg via ORAL
  Filled 2016-07-03 (×6): qty 1

## 2016-07-03 MED ORDER — SODIUM CHLORIDE 0.9 % IV SOLN
Freq: Once | INTRAVENOUS | Status: AC
  Administered 2016-07-03: 14:00:00 via INTRAVENOUS

## 2016-07-03 MED ORDER — SODIUM CHLORIDE 3 % IV SOLN
INTRAVENOUS | Status: DC
  Administered 2016-07-03: 30 mL/h via INTRAVENOUS
  Filled 2016-07-03: qty 500

## 2016-07-03 MED ORDER — BISACODYL 5 MG PO TBEC: 5.0000 mg | DELAYED_RELEASE_TABLET | Freq: Every day | ORAL | Status: DC | PRN

## 2016-07-03 MED ORDER — DEXTROSE 50 % IV SOLN
1.0000 | Freq: Once | INTRAVENOUS | Status: AC
  Administered 2016-07-03: 50 mL via INTRAVENOUS
  Filled 2016-07-03: qty 50

## 2016-07-03 MED ORDER — TRAMADOL HCL 50 MG PO TABS
50.0000 mg | ORAL_TABLET | Freq: Four times a day (QID) | ORAL | Status: DC | PRN
  Administered 2016-07-03 – 2016-07-07 (×7): 50 mg via ORAL
  Filled 2016-07-03 (×8): qty 1

## 2016-07-03 MED ORDER — NADOLOL 40 MG PO TABS
20.0000 mg | ORAL_TABLET | Freq: Every day | ORAL | Status: DC
  Administered 2016-07-03 – 2016-07-08 (×6): 20 mg via ORAL
  Filled 2016-07-03 (×6): qty 1

## 2016-07-03 MED ORDER — SODIUM CHLORIDE 0.9 % IV SOLN
Freq: Once | INTRAVENOUS | Status: AC
  Administered 2016-07-03: 09:00:00 via INTRAVENOUS

## 2016-07-03 MED ORDER — SODIUM CHLORIDE 0.9 % IV SOLN
INTRAVENOUS | Status: DC
  Administered 2016-07-03 – 2016-07-04 (×2): via INTRAVENOUS

## 2016-07-03 MED ORDER — SPIRONOLACTONE 25 MG PO TABS
50.0000 mg | ORAL_TABLET | Freq: Every day | ORAL | Status: DC
Start: 2016-07-03 — End: 2016-07-04
  Administered 2016-07-03 – 2016-07-04 (×2): 50 mg via ORAL
  Filled 2016-07-03 (×2): qty 2

## 2016-07-03 NOTE — ED Triage Notes (Signed)
Pt arrives via EMS for concern about increase fluid buildup.  Pt reports hs of cirrhosis.

## 2016-07-03 NOTE — ED Provider Notes (Signed)
[facility] Emergency Department Provider Note   ____________________________________________   None    (approximate)  I have reviewed the triage vital signs and the nursing notes.   HISTORY  Chief Complaint Weakness  HPI KINDRICK LANKFORD is a 70 y.o. male who reports he's been feeling weak all night. He has been feeling so weak he is says he is having difficulty walking. He denies any lightheadedness however. he denies any fever nausea vomiting diarrhea chills coughing etc. Patient reports that his ascitic fluid has been draining more than usual for the last few days. He has an umbilical hernia and a skin flap there intermittently drains ascitic fluid. He's had a ostomy bag applied over the site for some time.  Past Medical History:  Diagnosis Date  . Anxiety   . Ascites   . Chronic hyponatremia   . Cirrhosis (HCC)   . Depression   . DNR (do not resuscitate)   . GERD (gastroesophageal reflux disease)   . Hypertension   . Umbilical hernia     Patient Active Problem List   Diagnosis Date Noted  . Hepatic cirrhosis (HCC) 04/28/2016  . Hyponatremia 04/26/2016  . Umbilical hernia with gangrene   . Open wound of umbilical region   . Ascites 03/12/2016    Past Surgical History:  Procedure Laterality Date  . CHOLECYSTECTOMY    . THROAT SURGERY      Prior to Admission medications   Medication Sig Start Date End Date Taking? Authorizing Provider  bisacodyl (DULCOLAX) 5 MG EC tablet Take 1 tablet (5 mg total) by mouth daily as needed for moderate constipation. 03/16/16  Yes Ramonita Lab, MD  furosemide (LASIX) 40 MG tablet Take 1 tablet (40 mg total) by mouth daily. 05/01/16  Yes Srikar Sudini, MD  lactulose (CHRONULAC) 10 GM/15ML solution Take 15 mLs (10 g total) by mouth daily. 05/01/16  Yes Srikar Sudini, MD  LORazepam (ATIVAN) 0.5 MG tablet Take 1 tablet (0.5 mg total) by mouth 2 (two) times daily. 05/01/16  Yes Srikar Sudini, MD  nadolol (CORGARD)  20 MG tablet Take 20 mg by mouth daily.   Yes Historical Provider, MD  omeprazole (PRILOSEC) 20 MG capsule Take 20 mg by mouth 2 (two) times daily.    Yes Historical Provider, MD  ondansetron (ZOFRAN-ODT) 4 MG disintegrating tablet Take 4 mg by mouth every 8 (eight) hours as needed for nausea or vomiting.   Yes Historical Provider, MD  potassium chloride SA (K-DUR,KLOR-CON) 20 MEQ tablet Take 20 mEq by mouth 2 (two) times daily. 05/28/16  Yes Historical Provider, MD  simethicone (MYLICON) 80 MG chewable tablet Chew 160-240 mg by mouth every 6 (six) hours as needed for flatulence.    Yes Historical Provider, MD  sodium chloride 1 g tablet Take 1 tablet (1 g total) by mouth daily. 05/01/16  Yes Srikar Sudini, MD  traMADol (ULTRAM) 50 MG tablet Take 1 tablet (50 mg total) by mouth every 6 (six) hours as needed for moderate pain or severe pain. 05/01/16  Yes Srikar Sudini, MD  zolpidem (AMBIEN) 10 MG tablet Take 1 tablet (10 mg total) by mouth at bedtime as needed for sleep. 05/01/16  Yes Srikar Sudini, MD  ceFAZolin (ANCEF) 2-4 GM/100ML-% IVPB Inject 100 mLs (2 g total) into the vein every 8 (eight) hours. Patient not taking: Reported on 07/03/2016 05/01/16   Milagros Loll, MD  doxycycline (VIBRAMYCIN) 100 MG capsule Take 1 capsule (100 mg total) by mouth 2 (two) times daily.  Patient not taking: Reported on 07/03/2016 06/20/16   Governor Rooksebecca Lord, MD  metroNIDAZOLE (FLAGYL) 500 MG tablet Take 1 tablet (500 mg total) by mouth every 8 (eight) hours. Patient not taking: Reported on 07/03/2016 05/01/16   Milagros LollSrikar Sudini, MD  ofloxacin (OCUFLOX) 0.3 % ophthalmic solution Place 1 drop into the left eye 4 (four) times daily. Patient not taking: Reported on 07/03/2016 03/16/16   Ramonita LabAruna Gouru, MD  spironolactone (ALDACTONE) 50 MG tablet Take 50 mg by mouth daily. 04/14/16   Historical Provider, MD    Allergies Dilantin [phenytoin sodium extended] and Penicillins  Family History  Problem Relation Age of Onset  . CAD Mother      Social History Social History  Substance Use Topics  . Smoking status: Former Games developermoker  . Smokeless tobacco: Not on file  . Alcohol use No    Review of Systems Constitutional: No fever/chills Eyes: No visual changes. ENT: No sore throat. Cardiovascular: Denies chest pain. Respiratory: Denies shortness of breath. Gastrointestinal: No abdominal pain.  No nausea, no vomiting.  No diarrhea.  No constipation. Genitourinary: Negative for dysuria. Musculoskeletal: Negative for back pain. Skin: Negative for rash. Neurological: Negative for headaches, focal weakness or numbness.  10-point ROS otherwise negative.  ____________________________________________   PHYSICAL EXAM:  VITAL SIGNS: ED Triage Vitals [07/03/16 0741]  Enc Vitals Group     BP 129/60     Pulse      Resp (!) 22     Temp 97.4 F (36.3 C)     Temp Source Oral     SpO2 99 %     Weight      Height      Head Circumference      Peak Flow      Pain Score      Pain Loc      Pain Edu?      Excl. in GC?     Constitutional: Alert and oriented. Well appearing and in no acute distress. Eyes: Conjunctivae are normal. PERRL. EOMI. Head: Atraumatic. Nose: No congestion/rhinnorhea. Mouth/Throat: Mucous membranes are moist.  Oropharynx non-erythematous. Neck: No stridor.  Cardiovascular: Slightly bradycardia rate, regular rhythm. Grossly normal heart sounds.  Good peripheral circulation. Respiratory: Normal respiratory effort.  No retractions. Lungs CTAB. Gastrointestinal: Soft and nontender. No distention. No abdominal bruits. No CVA tenderness. His ostomy bag is applied to his abdomen and the ascitic fluid and there is yellow perhaps slightly milky Musculoskeletal: No lower extremity tenderness nor edema.  No joint effusions. Neurologic:  Slightly slow speech normal language. No gross focal neurologic deficits are appreciated. Cranial nerves II through XII are intact motor strength is equal in both arms and legs  although he appears to be slightly weak patient walks very slowly and requires support from the nurse to do so. Skin:  Skin is warm, dry and intact. No rash noted.   ____________________________________________   LABS (all labs ordered are listed, but only abnormal results are displayed)  Labs Reviewed  COMPREHENSIVE METABOLIC PANEL - Abnormal; Notable for the following:       Result Value   Sodium 111 (*)    Potassium 6.3 (*)    Chloride 87 (*)    CO2 19 (*)    Glucose, Bld 100 (*)    BUN 23 (*)    Calcium 7.9 (*)    Total Protein 6.4 (*)    Albumin 2.5 (*)    AST 69 (*)    Alkaline Phosphatase 223 (*)    All  other components within normal limits  CBC WITH DIFFERENTIAL/PLATELET - Abnormal; Notable for the following:    WBC 10.7 (*)    RBC 4.07 (*)    Hemoglobin 12.0 (*)    HCT 33.8 (*)    RDW 15.8 (*)    Neutro Abs 8.1 (*)    Monocytes Absolute 1.3 (*)    All other components within normal limits  URINALYSIS COMPLETEWITH MICROSCOPIC ([facility] ONLY) - Abnormal; Notable for the following:    Color, Urine STRAW (*)    APPearance CLEAR (*)    Squamous Epithelial / LPF 0-5 (*)    All other components within normal limits  BODY FLUID CULTURE  TROPONIN I  LACTIC ACID, PLASMA  LACTIC ACID, PLASMA  BODY FLUID CELL COUNT WITH DIFFERENTIAL   ____________________________________________  EKG  EKG read and interpreted by me shows normal sinus rhythm at a rate of 56 normal axis very poor amplitude in the lateral chest leads V4 5 and 6 otherwise seems to be similar to previous one ____________________________________________  RADIOLOGY  CLINICAL DATA:  Weakness.  EXAM: PORTABLE CHEST 1 VIEW  COMPARISON:  Radiograph of April 30, 2016.  FINDINGS: The heart size and mediastinal contours are within normal limits. Both lungs are clear. No pneumothorax or pleural effusion is noted. The visualized skeletal structures are unremarkable.  IMPRESSION: No acute cardiopulmonary  abnormality seen.   Electronically Signed   By: Lupita RaiderJames  Green Jr, M.D.   On: 07/03/2016 08:14 ____________________________________________   PROCEDURES  Procedure(s) performed:  Procedures  Critical Care performed:Critical care time 15 minutes. Includes evaluating the patient reviewing the old records and discussing the patient patient with hospitalist and treating the patient this is in addition to the half-hour of regular visit  ____________________________________________   INITIAL IMPRESSION / ASSESSMENT AND PLAN / ED COURSE  Pertinent labs & imaging results that were available during my care of the patient were reviewed by me and considered in my medical decision making (see chart for details).    Clinical Course     ____________________________________________   FINAL CLINICAL IMPRESSION(S) / ED DIAGNOSES  Final diagnoses:  Hyponatremia  Hyperkalemia      NEW MEDICATIONS STARTED DURING THIS VISIT:  New Prescriptions   No medications on file     Note:  This document was prepared using Dragon voice recognition software and may include unintentional dictation errors.    Arnaldo NatalPaul F Cristen Murcia, MD 07/03/16 1006

## 2016-07-03 NOTE — ED Notes (Signed)
Patal returned paged. Is aware of need for central line if she want to continue 3% solution order.

## 2016-07-03 NOTE — ED Notes (Signed)
[facility] receive IV infusion through peripheral of 3% solution at 25 mL/hr.

## 2016-07-03 NOTE — ED Notes (Signed)
Spoke with hank in pharmacy.  Reports that it is safe to infusion 3% solution through IV but recommended infusing with NS to dilute infusion.  This was relayed to RN on 1C.

## 2016-07-03 NOTE — ED Notes (Signed)
Patient transported to CT 

## 2016-07-03 NOTE — ED Notes (Signed)
Reviewed hypertonic solution order.  Per order and per pharmacy, 3% solution infusion needs to be done via central line.  MD S. Patel paged about order. Pt has no central line at this time.

## 2016-07-03 NOTE — Progress Notes (Signed)
CSW is following patient. PT is currently pending. Awaiting their deposition to determine patient's possible needs at discharge. CSW will continue to follow and assist.  Woodroe Modehristina Mallika Sanmiguel, MSW, LCSW, LCAS-A Clinical Social Worker [phone]

## 2016-07-03 NOTE — Progress Notes (Signed)
Patient arrived to 115 from ED. Jeff Preston,Jeff Thinnes S, RN

## 2016-07-03 NOTE — H&P (Signed)
[facility] [place] at [facility]   PATIENT NAME: Jeff Preston    MR#:  [id number]  DATE OF BIRTH:  01/09/1946  DATE OF ADMISSION:  07/03/2016  PRIMARY CARE PHYSICIAN: Lynnae PrudeELLIOTT, [name], MD   REQUESTING/REFERRING PHYSICIAN: Dr Darnelle CatalanMalinda  CHIEF COMPLAINT:  Increasing weakness decrease appetite and overall not feeling well  HISTORY OF PRESENT ILLNESS:  Jeff SchillerDouglas Sow  is a 70 y.o. male with a known history of Alcoholic cirrhosis, chronic ascites, umbilical hernia leaking ascetic fluid for which he has refused in this surgery in the past who has a colostomy bag for acetic fluid collection comes to the emergency room with increasing weakness, poor appetite and was found to have sodium of 111. His potassium was 6.3. Patient denies any alcohol or beer intake. He lives with his son. He is being admitted for acute on chronic hyponatremia, hyperkalemia   PAST MEDICAL HISTORY:   Past Medical History:  Diagnosis Date  . Anxiety   . Ascites   . Chronic hyponatremia   . Cirrhosis (HCC)   . Depression   . DNR (do not resuscitate)   . GERD (gastroesophageal reflux disease)   . Hypertension   . Umbilical hernia     PAST SURGICAL HISTOIRY:   Past Surgical History:  Procedure Laterality Date  . CHOLECYSTECTOMY    . THROAT SURGERY      SOCIAL HISTORY:   Social History  Substance Use Topics  . Smoking status: Former Games developermoker  . Smokeless tobacco: Not on file  . Alcohol use No    FAMILY HISTORY:   Family History  Problem Relation Age of Onset  . CAD Mother     DRUG ALLERGIES:   Allergies  Allergen Reactions  . Dilantin [Phenytoin Sodium Extended] Rash  . Penicillins Rash and Other (See Comments)    Has patient had a PCN reaction causing immediate rash, facial/tongue/throat swelling, SOB or lightheadedness with hypotension: No Has patient had a PCN reaction causing severe rash involving mucus membranes or skin necrosis: No Has patient had a PCN  reaction that required hospitalization No Has patient had a PCN reaction occurring within the last 10 years: No If all of the above answers are "NO", then may proceed with Cephalosporin use.    REVIEW OF SYSTEMS:  Review of Systems  Constitutional: Negative for chills, fever and weight loss.  HENT: Negative for ear discharge, ear pain and nosebleeds.   Eyes: Negative for blurred vision, pain and discharge.  Respiratory: Negative for sputum production, shortness of breath, wheezing and stridor.   Cardiovascular: Negative for chest pain, palpitations, orthopnea and PND.  Gastrointestinal: Negative for abdominal pain, diarrhea, nausea and vomiting.  Genitourinary: Negative for frequency and urgency.  Musculoskeletal: Positive for back pain. Negative for joint pain.  Neurological: Positive for weakness. Negative for sensory change, speech change and focal weakness.  Psychiatric/Behavioral: Negative for depression and hallucinations. The patient is not nervous/anxious.      MEDICATIONS AT HOME:   Prior to Admission medications   Medication Sig Start Date End Date Taking? Authorizing Provider  bisacodyl (DULCOLAX) 5 MG EC tablet Take 1 tablet (5 mg total) by mouth daily as needed for moderate constipation. 03/16/16  Yes Ramonita LabAruna Gouru, MD  furosemide (LASIX) 40 MG tablet Take 1 tablet (40 mg total) by mouth daily. 05/01/16  Yes Srikar Sudini, MD  lactulose (CHRONULAC) 10 GM/15ML solution Take 15 mLs (10 g total) by mouth daily. 05/01/16  Yes Srikar Sudini, MD  LORazepam (ATIVAN) 0.5 MG  tablet Take 1 tablet (0.5 mg total) by mouth 2 (two) times daily. 05/01/16  Yes Srikar Sudini, MD  nadolol (CORGARD) 20 MG tablet Take 20 mg by mouth daily.   Yes Historical Provider, MD  omeprazole (PRILOSEC) 20 MG capsule Take 20 mg by mouth 2 (two) times daily.    Yes Historical Provider, MD  ondansetron (ZOFRAN-ODT) 4 MG disintegrating tablet Take 4 mg by mouth every 8 (eight) hours as needed for nausea or vomiting.    Yes Historical Provider, MD  potassium chloride SA (K-DUR,KLOR-CON) 20 MEQ tablet Take 20 mEq by mouth 2 (two) times daily. 05/28/16  Yes Historical Provider, MD  simethicone (MYLICON) 80 MG chewable tablet Chew 160-240 mg by mouth every 6 (six) hours as needed for flatulence.    Yes Historical Provider, MD  sodium chloride 1 g tablet Take 1 tablet (1 g total) by mouth daily. 05/01/16  Yes Srikar Sudini, MD  traMADol (ULTRAM) 50 MG tablet Take 1 tablet (50 mg total) by mouth every 6 (six) hours as needed for moderate pain or severe pain. 05/01/16  Yes Srikar Sudini, MD  zolpidem (AMBIEN) 10 MG tablet Take 1 tablet (10 mg total) by mouth at bedtime as needed for sleep. 05/01/16  Yes Srikar Sudini, MD  spironolactone (ALDACTONE) 50 MG tablet Take 50 mg by mouth daily. 04/14/16   Historical Provider, MD      VITAL SIGNS:  Blood pressure 129/60, temperature 97.4 F (36.3 C), temperature source Oral, resp. rate (!) 22, height 5\' 6"  (1.676 m), weight 193 lb (87.5 kg), SpO2 99 %.  PHYSICAL EXAMINATION:  GENERAL:  70 y.o.-year-old patient lying in the bed with no acute distress. Appears chronically ill EYES: Pupils equal, round, reactive to light and accommodation. No scleral icterus. Extraocular muscles intact.  HEENT: Head atraumatic, normocephalic. Oropharynx and nasopharynx clear.  NECK:  Supple, no jugular venous distention. No thyroid enlargement, no tenderness.  LUNGS: Normal breath sounds bilaterally, no wheezing, rales,rhonchi or crepitation. No use of accessory muscles of respiration.  CARDIOVASCULAR: S1, S2 normal. No murmurs, rubs, or gallops.  ABDOMEN: Soft, nontender, nondistended. Bowel sounds present. No organomegaly or mass. Patient has a colostomy bag in front of his umbilical hernia which is collecting straw-colored ascitic fluid  EXTREMITIES: No pedal edema, cyanosis, or clubbing.  NEUROLOGIC: Cranial nerves II through XII are intact. Muscle strength 5/5 in all extremities. Sensation  intact. Gait not checked. Overall weak PSYCHIATRIC: patient is alert and oriented x 3.  SKIN: No obvious rash, lesion, or ulcer.   LABORATORY PANEL:   CBC  Recent Labs Lab 07/03/16 0801  WBC 10.7*  HGB 12.0*  HCT 33.8*  PLT 307   ------------------------------------------------------------------------------------------------------------------  Chemistries   Recent Labs Lab 07/03/16 0801  NA 111*  K 6.3*  CL 87*  CO2 19*  GLUCOSE 100*  BUN 23*  CREATININE 1.11  CALCIUM 7.9*  AST 69*  ALT 35  ALKPHOS 223*  BILITOT 0.6   ------------------------------------------------------------------------------------------------------------------  Cardiac Enzymes  Recent Labs Lab 07/03/16 0801  TROPONINI <0.03   ------------------------------------------------------------------------------------------------------------------  RADIOLOGY:  Dg Chest Portable 1 View  Result Date: 07/03/2016 CLINICAL DATA:  Weakness. EXAM: PORTABLE CHEST 1 VIEW COMPARISON:  Radiograph of April 30, 2016. FINDINGS: The heart size and mediastinal contours are within normal limits. Both lungs are clear. No pneumothorax or pleural effusion is noted. The visualized skeletal structures are unremarkable. IMPRESSION: No acute cardiopulmonary abnormality seen. Electronically Signed   By: Lupita Raider, M.D.   On: 07/03/2016 08:14  EKG:    IMPRESSION AND PLAN:  Jeff SchillerDouglas Esper  is a 70 y.o. male with a known history of Alcoholic cirrhosis, chronic ascites, umbilical hernia leaking ascetic fluid for which he has refused in this surgery in the past who has a colostomy bag for acetic fluid collection comes to the emergency room with increasing weakness, poor appetite and was found to have sodium of 111. His potassium was 6.3.  * Acute on chronic hyponatremia (baseline 126-132) Continue with with normal saline and salt tablets.  -Start by mouth Lasix and spironolactone once labs show improvement  -Monitor  sodium every 6 hourly  * hyperkalemia -IV lasix once -Dextrose+ insulin -Kayexalate  * Cirrhosis with chronic ascites Negative for recurrent ascites per US in June 2017 Continue nadolol and hold spironolactone due to hyperkalemia  *Umbilical hernia with leaking ascetic fluid Surgical team  recommended that a draining umbilical hernia have medical optimization and then operative intervention at a center that can manage liver patient's and has the appropriate blood products available such as [facility] or [facility]. I will reevaluation was done by surgery during previous admission  Continue colostomy bag over umbilical  -ascitic fluid today shows 2400 WBCs Fluid culture results pending -In the interim I will start patient on IV Rocephin empirically. Patient has been seen by Dr. Sampson GoonFitzgerald in the past consider ID consultation pending fluid culture results  *Generalized weakness deconditioning -Physical therapy to see patient -Social worker/Manager for discharge planning    All the records are reviewed and case discussed with ED provider. Management plans discussed with the patient, family and they are in agreement.  CODE STATUS: DO NOT RESUSCITATE  TOTAL TIME TAKING CARE OF THIS PATIENT: 50 minutes.    Arie Gable M.D on 07/03/2016 at 10:36 AM  Between 7am to 6pm - Pager - [phone]  After 6pm go to [email or web address] - password [email or web address] [facility]  [facility]  Office  [phone]  CC: Primary care physician; Lynnae PrudeELLIOTT, [name], MD

## 2016-07-03 NOTE — Progress Notes (Signed)
[facility]  ROUNDING NOTE   Subjective:   Patient was discharged home from [facility]. He returns with hyponatremia, hyperkalemia and metabolic acidosis.  History taken from sister and family at bedside. Patient is very weak and unable to give history.   Na 111  Objective:  Vital signs in last 24 hours:  Temp:  [97.4 F (36.3 C)-97.5 F (36.4 C)] 97.5 F (36.4 C) (08/24 1242) Pulse Rate:  [77] 77 (08/24 1242) Resp:  [14-22] 17 (08/24 1218) BP: (117-143)/(60-83) 132/63 (08/24 1242) SpO2:  [98 %-100 %] 100 % (08/24 1242) Weight:  [79.6 kg (175 lb 6.4 oz)-87.5 kg (193 lb)] 79.6 kg (175 lb 6.4 oz) (08/24 1242)  Weight change:  Filed Weights   07/03/16 0743 07/03/16 1242  Weight: 87.5 kg (193 lb) 79.6 kg (175 lb 6.4 oz)    Intake/Output: No intake/output data recorded.   Intake/Output this shift:  Total I/O In: -  Out: 500 [Urine:500]  Physical Exam: General: Cachectic  Head: Normocephalic, atraumatic. Moist oral mucosal membranes  Eyes: Anicteric, PERRL  Neck: Supple, trachea midline  Lungs:  Clear to auscultation  Heart: Regular rate and rhythm  Abdomen:  +ostomy, +umbilical hernia  Extremities: no peripheral edema.  Neurologic: alert  Skin: No lesions       Basic Metabolic Panel:  Recent Labs Lab 07/03/16 0801  NA 111*  K 6.3*  CL 87*  CO2 19*  GLUCOSE 100*  BUN 23*  CREATININE 1.11  CALCIUM 7.9*    Liver Function Tests:  Recent Labs Lab 07/03/16 0801  AST 69*  ALT 35  ALKPHOS 223*  BILITOT 0.6  PROT 6.4*  ALBUMIN 2.5*   No results for input(s): LIPASE, AMYLASE in the last 168 hours. No results for input(s): AMMONIA in the last 168 hours.  CBC:  Recent Labs Lab 07/03/16 0801  WBC 10.7*  NEUTROABS 8.1*  HGB 12.0*  HCT 33.8*  MCV 83.2  PLT 307    Cardiac Enzymes:  Recent Labs Lab 07/03/16 0801  TROPONINI <0.03    BNP: Invalid input(s): POCBNP  CBG: No results for input(s): GLUCAP in the last 168  hours.  Microbiology: Results for orders placed or performed during the hospital encounter of 07/03/16  Gram stain     Status: None   Collection Time: 07/03/16  8:26 AM  Result Value Ref Range Status   Specimen Description FLUID ABDOMEN  Final   Special Requests NONE  Final   Gram Stain   Final    MODERATE WBC PRESENT,BOTH PMN AND MONONUCLEAR ABUNDANT GRAM POSITIVE COCCI IN PAIRS IN CHAINS MODERATE GRAM VARIABLE ROD    Report Status 07/03/2016 FINAL  Final    Coagulation Studies: No results for input(s): LABPROT, INR in the last 72 hours.  Urinalysis:  Recent Labs  07/03/16 0826  COLORURINE STRAW*  LABSPEC 1.006  PHURINE 6.0  GLUCOSEU NEGATIVE  HGBUR NEGATIVE  BILIRUBINUR NEGATIVE  KETONESUR NEGATIVE  PROTEINUR NEGATIVE  NITRITE NEGATIVE  LEUKOCYTESUR NEGATIVE      Imaging: Dg Chest Portable 1 View  Result Date: 07/03/2016 CLINICAL DATA:  Weakness. EXAM: PORTABLE CHEST 1 VIEW COMPARISON:  Radiograph of April 30, 2016. FINDINGS: The heart size and mediastinal contours are within normal limits. Both lungs are clear. No pneumothorax or pleural effusion is noted. The visualized skeletal structures are unremarkable. IMPRESSION: No acute cardiopulmonary abnormality seen. Electronically Signed   By: Lupita RaiderJames  Green Jr, M.D.   On: 07/03/2016 08:14     Medications:   .  sodium chloride (hypertonic)     . sodium chloride   Intravenous Once  . cefTRIAXone (ROCEPHIN)  IV  1 g Intravenous Q24H  . enoxaparin (LOVENOX) injection  40 mg Subcutaneous Q24H  . lactulose  10 g Oral Daily  . nadolol  20 mg Oral Daily  . pantoprazole  40 mg Oral Daily  . sodium chloride  1 g Oral Daily  . spironolactone  50 mg Oral Daily   bisacodyl, ondansetron, simethicone, traMADol  Assessment/ Plan:  [name] is a 70 y.o. white male with hypertension, GERD, cirrhosis of the liver secondary to alcohol abuse, anxiety, umbilical hernia with leaking ascites fluid, chronic hyponatremia,     1. Acute on chronic hyponatremia: Baseline Na 125-130 - Started on peripheral hypertonic saline. q4 hour sodiums, Will transition to PO sodium when Na >120.   2. Hyperkalemia and metabolic acidosis: on oral potassium supplements at home.  - hold potassium  - monitor GI losses from ostomy. Given kayexalate  3. End Stage Liver Disease: hepatic cirrhosis: overall prognosis is poor.    LOS: 0 Jeff Preston 8/24/20171:55 PM

## 2016-07-04 ENCOUNTER — Encounter: Payer: Self-pay | Admitting: Neurology

## 2016-07-04 LAB — BASIC METABOLIC PANEL
ANION GAP: 8 (ref 5–15)
ANION GAP: 7 (ref 5–15)
BUN: 21 mg/dL — ABNORMAL HIGH (ref 6–20)
BUN: 20 mg/dL (ref 6–20)
CHLORIDE: 90 mmol/L — AB (ref 101–111)
CO2: 19 mmol/L — AB (ref 22–32)
CO2: 21 mmol/L — AB (ref 22–32)
Calcium: 7.4 mg/dL — ABNORMAL LOW (ref 8.9–10.3)
Calcium: 7.5 mg/dL — ABNORMAL LOW (ref 8.9–10.3)
Chloride: 89 mmol/L — ABNORMAL LOW (ref 101–111)
Creatinine, Ser: 1.14 mg/dL (ref 0.61–1.24)
Creatinine, Ser: 1.2 mg/dL (ref 0.61–1.24)
GFR calc Af Amer: >60 mL/min (ref >60)
GFR calc non Af Amer: >60 mL/min (ref >60)
GFR calc non Af Amer: 60 mL/min — ABNORMAL LOW (ref >60)
GLUCOSE: 107 mg/dL — AB (ref 65–99)
Glucose, Bld: 108 mg/dL — ABNORMAL HIGH (ref 65–99)
POTASSIUM: 4.9 mmol/L (ref 3.5–5.1)
Potassium: 4.8 mmol/L (ref 3.5–5.1)
Sodium: 116 mmol/L — CL (ref 135–145)
Sodium: 118 mmol/L — CL (ref 135–145)

## 2016-07-04 LAB — CBC
HCT: 33.1 % — ABNORMAL LOW (ref 40.0–52.0)
HEMOGLOBIN: 11.4 g/dL — AB (ref 13.0–18.0)
MCH: 28.9 pg (ref 26.0–34.0)
MCHC: 34.6 g/dL (ref 32.0–36.0)
MCV: 83.6 fL (ref 80.0–100.0)
Platelets: 228 10*3/uL (ref 150–440)
RBC: 3.95 MIL/uL — AB (ref 4.40–5.90)
RDW: 16.1 % — ABNORMAL HIGH (ref 11.5–14.5)
WBC: 8.1 10*3/uL (ref 3.8–10.6)

## 2016-07-04 LAB — SODIUM
SODIUM: 118 mmol/L — AB (ref 135–145)
SODIUM: 118 mmol/L — AB (ref 135–145)
Sodium: 116 mmol/L — CL (ref 135–145)
Sodium: 116 mmol/L — CL (ref 135–145)

## 2016-07-04 MED ORDER — SODIUM CHLORIDE 1 G PO TABS
1.0000 g | ORAL_TABLET | Freq: Three times a day (TID) | ORAL | Status: DC
  Administered 2016-07-04 – 2016-07-07 (×11): 1 g via ORAL
  Filled 2016-07-04 (×11): qty 1

## 2016-07-04 MED ORDER — FUROSEMIDE 40 MG PO TABS
40.0000 mg | ORAL_TABLET | Freq: Two times a day (BID) | ORAL | Status: DC
  Administered 2016-07-04 – 2016-07-08 (×8): 40 mg via ORAL
  Filled 2016-07-04 (×8): qty 1

## 2016-07-04 MED ORDER — SPIRONOLACTONE 25 MG PO TABS
25.0000 mg | ORAL_TABLET | Freq: Every day | ORAL | Status: DC
  Administered 2016-07-05 – 2016-07-08 (×4): 25 mg via ORAL
  Filled 2016-07-04 (×4): qty 1

## 2016-07-04 NOTE — Progress Notes (Signed)
Palliative Medicine consult noted. Due to high referral volume, there may be a delay seeing this patient. Please call the Palliative Medicine Team office at [phone] if recommendations are needed in the interim.  Thank you for inviting us to see this patient.  Margret ChanceMelanie G. Jessamyn Watterson, RN, BSN, [facility] 07/04/2016 11:09 AM Cell [phone] 8:00-4:00 Monday-Friday Office [phone]

## 2016-07-04 NOTE — Progress Notes (Signed)
PT recommended HH at discharge. RNCM following patient. CSW is signing off but is available if a need were to arise.  Woodroe Modehristina Valrie Jia, MSW, LCSW, LCAS-A Clinical Social Worker [phone]

## 2016-07-04 NOTE — Care Management Important Message (Signed)
Important Message  Patient Details  Name: Jeff Preston MRN: [id number] Date of Birth: [birth year]   Medicare Important Message Given:  Yes    Gwenette GreetBrenda S Fatiha Guzy, RN 07/04/2016, 9:14 AM

## 2016-07-04 NOTE — Evaluation (Signed)
Physical Therapy Evaluation Patient Details Name: Jeff Preston MRN: [id number] DOB: Dec 28, 1945 Today's Date: 07/04/2016   History of Present Illness  Pt is a 70 y/o male presenting with hyponatremia 111 and hyperkalemia 6.3. He has a leaking hernia, currently with ostomy bag over it. He has a history of end stage alcoholic cirrhosis, ascites and has had multiple paracenteses performed, HTN, GERD, and chronic hyponatremia  Clinical Impression  Jeff Preston is a pleasant 70 y/o male. He presents with generally poor balance, and slowed comprehension/mild confusion. Pt had sodium level of 116 at last blood draw; nephrologist and nurse consulted before evaluation and indicated patient was appropriate for PT at this time. He was amiable and cooperative, but had some difficulty following multi-step directions. Pt ambulated with and without RW. Pt was independent with bed mobility, and needed CGA to safely transfer due to balance deficits. Without RW pt required min assist due to tendency to stagger with frequent minor LOB. Pt attempts to hold onto rails and counter tops as he walks. Pt then received gait training X 180' with cuing for safe technique; pt's gait speed and stability increased significantly with no staggering requiring only CGA; pt scored 9 on modified DGI which is boarderline for fall risk. pt received gait training X 260' total. Pt also received therapeutic activity training for safe use of the bathroom as described below. Pt is appropriate for skilled PT at this time to address deficits in balance, coordination, endurance, gait, safety awareness, and safe use of DME.      Follow Up Recommendations Home health PT    Equipment Recommendations  Rolling walker with 5" wheels    Recommendations for Other Services       Precautions / Restrictions Precautions Precautions: Fall Restrictions Weight Bearing Restrictions: No      Mobility  Bed Mobility Overal bed mobility:  Independent                Transfers Overall transfer level: Modified independent Equipment used: Rolling walker (2 wheeled)             General transfer comment: Pt stands independently, but instictively reaches for nearby stationary objects to steady himself. He requires RW for safe transfer  Ambulation/Gait Ambulation/Gait assistance: Min assist Ambulation Distance (Feet): 260 Feet Assistive device: Rolling walker (2 wheeled);None Gait Pattern/deviations: Step-to pattern;Decreased stride length;Staggering right;Staggering left;Drifts right/left;Wide base of support Gait velocity: 1.11'/second Gait velocity interpretation: <1.8 ft/sec, indicative of risk for recurrent falls General Gait Details: Pt ambulated with and without RW. Without RW pt required min assist due to tendency to stagger with frequent minor LOB. Pt attempts to hold onto rails and counter tops as he walks. Pt then received gait training X 180' with cuing for safe technique; pt's gait speed and stability increased significantly with no staggering requiring only CGA  Stairs            Wheelchair Mobility    Modified Rankin (Stroke Patients Only)       Balance Overall balance assessment: Needs assistance Sitting-balance support: No upper extremity supported;Feet supported Sitting balance-Leahy Scale: Good Sitting balance - Comments: pt withstands moderate perturbation with fast adjustment   Standing balance support: Bilateral upper extremity supported Standing balance-Leahy Scale: Good Standing balance comment: Pt is stable can can tolerate challenge with RW Single Leg Stance - Right Leg: 0.5 (with single UE support and no LOB) Single Leg Stance - Left Leg: 0.5 (with single UE support and no LOB)  Standardized Balance Assessment Standardized Balance Assessment : Dynamic Gait Index (modified)   Dynamic Gait Index Level Surface: Mild Impairment Change in Gait Speed: Mild  Impairment Gait with Horizontal Head Turns: Mild Impairment Gait with Vertical Head Turns: Normal Gait and Pivot Turn: Severe Impairment Step Over Obstacle: Severe Impairment Step Around Obstacles: Severe Impairment Steps: Severe Impairment Total Score: 9       Pertinent Vitals/Pain Pain Assessment: No/denies pain    Home Living Family/patient expects to be discharged to:: Private residence Living Arrangements: Children Available Help at Discharge: Family Type of Home: House Home Access: Stairs to enter Entrance Stairs-Rails: Right Entrance Stairs-Number of Steps: 3 Home Layout: One level Home Equipment: Cane - single point      Prior Function Level of Independence: Independent         Comments: Pt is a household ambulator and uses SPC occationally, he does not drive or leave home often other than to go to doctor visits. He is able to perform ADLs independently     Hand Dominance        Extremity/Trunk Assessment   Upper Extremity Assessment: Overall WFL for tasks assessed (Grossly 4+/5 with MMT)           Lower Extremity Assessment: Overall WFL for tasks assessed (Grossly 4+/5 with MMT)      Cervical / Trunk Assessment:  [facility])  Communication   Communication: No difficulties  Cognition Arousal/Alertness: Awake/alert Behavior During Therapy: WFL for tasks assessed/performed Overall Cognitive Status: No family/caregiver present to determine baseline cognitive functioning (Pt had delayed comprehension )                      General Comments      Exercises Other Exercises Other Exercises: Therepeutic therepeutic activity including use of bathroom  with cuing for safe technique for sit to stand CGA, instruction for safely donning clothing w/o LOB, and self hygiene. Other Exercises: Gait training with RW X 180' as described above      Assessment/Plan    PT Assessment Patient needs continued PT services  PT Diagnosis Difficulty  walking;Abnormality of gait;Generalized weakness   PT Problem List Decreased strength;Decreased range of motion;Decreased activity tolerance;Decreased balance;Decreased mobility;Decreased coordination;Decreased cognition;Decreased knowledge of use of DME;Decreased safety awareness  PT Treatment Interventions DME instruction;Gait training;Stair training;Functional mobility training;Therapeutic activities;Therapeutic exercise;Balance training;Neuromuscular re-education;Cognitive remediation;Patient/family education   PT Goals (Current goals can be found in the Care Plan section) Acute Rehab PT Goals Patient Stated Goal: go home PT Goal Formulation: With patient Time For Goal Achievement: 07/18/16 Potential to Achieve Goals: Good    Frequency Min 2X/week   Barriers to discharge        Co-evaluation               End of Session Equipment Utilized During Treatment: Gait belt Activity Tolerance: Patient tolerated treatment well Patient left: in bed;with call bell/phone within reach;with bed alarm set Nurse Communication: Mobility status         Time: 5409-8119 PT Time Calculation (min) (ACUTE ONLY): 26 min   Charges:         PT G Codes:        Oneal Schoenberger July 08, 2016, 1:34 PM  Cassell Smiles, SPT [phone]

## 2016-07-04 NOTE — Progress Notes (Signed)
[facility]  ROUNDING NOTE   Subjective:   Na 116 (118) Hypertonic saline yesterday, Transitioned to NS overnight.   Objective:  Vital signs in last 24 hours:  Temp:  [97.5 F (36.4 C)-97.9 F (36.6 C)] 97.7 F (36.5 C) (08/25 0441) Pulse Rate:  [67-77] 67 (08/25 0816) Resp:  [16-18] 18 (08/25 0816) BP: (95-132)/(48-83) 109/48 (08/25 0816) SpO2:  [98 %-100 %] 100 % (08/25 0816) Weight:  [79.6 kg (175 lb 6.4 oz)] 79.6 kg (175 lb 6.4 oz) (08/24 1242)  Weight change:  Filed Weights   07/03/16 0743 07/03/16 1242  Weight: 87.5 kg (193 lb) 79.6 kg (175 lb 6.4 oz)    Intake/Output: I/O last 3 completed shifts: In: 1142.5 [P.O.:240; I.V.:852.5; IV Piggyback:50] Out: 750 [Urine:500; Other:250]   Intake/Output this shift:  Total I/O In: 401.3 [P.O.:240; I.V.:161.3] Out: -   Physical Exam: General: Cachectic  Head: Normocephalic, atraumatic. Moist oral mucosal membranes  Eyes: Anicteric, PERRL  Neck: Supple, trachea midline  Lungs:  Clear to auscultation  Heart: Regular rate and rhythm  Abdomen:   +umbilical hernia with draining peritoneal fluid  Extremities: no peripheral edema.  Neurologic: alert  Skin: No lesions       Basic Metabolic Panel:  Recent Labs Lab 07/03/16 0801 07/03/16 1317 07/03/16 1703 07/03/16 2058 07/04/16 0049 07/04/16 0414 07/04/16 0921  NA 111* 114* 116* 115* 116*  116* 118* 116*  K 6.3* 4.9 4.8  --  4.9 4.8  --   CL 87* 86* 89*  --  89* 90*  --   CO2 19* 21* 19*  --  19* 21*  --   GLUCOSE 100* 106* 103*  --  107* 108*  --   BUN 23* 23* 22*  --  20 21*  --   CREATININE 1.11 1.32* 1.32*  --  1.14 1.20  --   CALCIUM 7.9* 7.9* 7.8*  --  7.4* 7.5*  --     Liver Function Tests:  Recent Labs Lab 07/03/16 0801  AST 69*  ALT 35  ALKPHOS 223*  BILITOT 0.6  PROT 6.4*  ALBUMIN 2.5*   No results for input(s): LIPASE, AMYLASE in the last 168 hours. No results for input(s): AMMONIA in the last 168 hours.  CBC:  Recent  Labs Lab 07/03/16 0801  WBC 10.7*  NEUTROABS 8.1*  HGB 12.0*  HCT 33.8*  MCV 83.2  PLT 307    Cardiac Enzymes:  Recent Labs Lab 07/03/16 0801  TROPONINI <0.03    BNP: Invalid input(s): POCBNP  CBG: No results for input(s): GLUCAP in the last 168 hours.  Microbiology: Results for orders placed or performed during the hospital encounter of 07/03/16  Culture, body fluid-bottle     Status: None (Preliminary result)   Collection Time: 07/03/16  8:26 AM  Result Value Ref Range Status   Specimen Description FLUID ABDOMEN  Final   Special Requests NONE  Final   Gram Stain   Final    GRAM POSITIVE COCCI IN PAIRS IN CHAINS GRAM NEGATIVE RODS IN BOTH AEROBIC AND ANAEROBIC BOTTLES Performed at [facility]    Culture PENDING  Incomplete   Report Status PENDING  Incomplete  Gram stain     Status: None   Collection Time: 07/03/16  8:26 AM  Result Value Ref Range Status   Specimen Description FLUID ABDOMEN  Final   Special Requests NONE  Final   Gram Stain   Final    MODERATE WBC PRESENT,BOTH PMN AND MONONUCLEAR  ABUNDANT GRAM POSITIVE COCCI IN PAIRS IN CHAINS MODERATE GRAM VARIABLE ROD    Report Status 07/03/2016 FINAL  Final  MRSA PCR Screening     Status: None   Collection Time: 07/03/16  4:30 PM  Result Value Ref Range Status   MRSA by PCR NEGATIVE NEGATIVE Final    Comment:        The GeneXpert MRSA Assay (FDA approved for NASAL specimens only), is one component of a comprehensive MRSA colonization surveillance program. It is not intended to diagnose MRSA infection nor to guide or monitor treatment for MRSA infections.     Coagulation Studies: No results for input(s): LABPROT, INR in the last 72 hours.  Urinalysis:  Recent Labs  07/03/16 0826  COLORURINE STRAW*  LABSPEC 1.006  PHURINE 6.0  GLUCOSEU NEGATIVE  HGBUR NEGATIVE  BILIRUBINUR NEGATIVE  KETONESUR NEGATIVE  PROTEINUR NEGATIVE  NITRITE NEGATIVE  LEUKOCYTESUR NEGATIVE       Imaging: Dg Chest Portable 1 View  Result Date: 07/03/2016 CLINICAL DATA:  Weakness. EXAM: PORTABLE CHEST 1 VIEW COMPARISON:  Radiograph of April 30, 2016. FINDINGS: The heart size and mediastinal contours are within normal limits. Both lungs are clear. No pneumothorax or pleural effusion is noted. The visualized skeletal structures are unremarkable. IMPRESSION: No acute cardiopulmonary abnormality seen. Electronically Signed   By: Lupita RaiderJames  Green Jr, M.D.   On: 07/03/2016 08:14     Medications:     . cefTRIAXone (ROCEPHIN)  IV  1 g Intravenous Q24H  . enoxaparin (LOVENOX) injection  40 mg Subcutaneous Q24H  . furosemide  40 mg Oral BID  . lactulose  10 g Oral Daily  . LORazepam  0.5 mg Oral BID  . nadolol  20 mg Oral Daily  . pantoprazole  40 mg Oral Daily  . sodium chloride  1 g Oral TID WC  . [START ON 07/05/2016] spironolactone  25 mg Oral Daily   bisacodyl, ondansetron, simethicone, traMADol, zolpidem  Assessment/ Plan:  Jeff Preston is a 70 y.o. 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 Acute on chronic hyponatremia: Baseline Na 125-130 - stop IV NS.  - Start sodium tablets - Fluid restriction - Start furosemide and spironolactone  2. Hyperkalemia and metabolic acidosis: on oral potassium supplements at home.  - hold potassium  - monitor GI losses from ostomy.  3. End Stage Liver Disease: hepatic cirrhosis: overall prognosis is poor. Palliative care consulted   LOS: 1 [name] 8/25/201711:37 AM

## 2016-07-04 NOTE — Progress Notes (Signed)
Patient ID: Jeff Preston, male   DOB: 1946-08-07, 70 y.o.   MRN: [id number]  [employer] PROGRESS NOTE  Jeff Preston WUJ:[id number] DOB: 1946-08-07 DOA: 07/03/2016 PCP: Lynnae PrudeELLIOTT, [name], MD  HPI/Subjective: Patient feeling better today than yesterday. Was feeling very weak when he came in. He was found to have a very low sodium on presentation. He states appetite is better today.  Objective: Vitals:   07/04/16 0816 07/04/16 1326  BP: (!) 109/48 (!) 101/59  Pulse: 67 61  Resp: 18 18  Temp:  97.4 F (36.3 C)    Filed Weights   07/03/16 0743 07/03/16 1242  Weight: 87.5 kg (193 lb) 79.6 kg (175 lb 6.4 oz)    ROS: Review of Systems  Constitutional: Negative for chills and fever.  Eyes: Negative for blurred vision.  Respiratory: Negative for cough and shortness of breath.   Cardiovascular: Negative for chest pain.  Gastrointestinal: Negative for abdominal pain, constipation, diarrhea, nausea and vomiting.  Genitourinary: Negative for dysuria.  Musculoskeletal: Negative for joint pain.  Neurological: Positive for weakness. Negative for dizziness and headaches.   Exam: Physical Exam  Constitutional: He is oriented to person, place, and time.  HENT:  Nose: No mucosal edema.  Mouth/Throat: No oropharyngeal exudate or posterior oropharyngeal edema.  Eyes: Conjunctivae, EOM and lids are normal. Pupils are equal, round, and reactive to light.  Neck: No JVD present. Carotid bruit is not present. No edema present. No thyroid mass and no thyromegaly present.  Cardiovascular: S1 normal and S2 normal.  Exam reveals no gallop.   Murmur heard.  Systolic murmur is present with a grade of 2/6  Pulses:      Dorsalis pedis pulses are 2+ on the right side, and 2+ on the left side.  Respiratory: No respiratory distress. He has no wheezes. He has no rhonchi. He has no rales.  GI: Soft. Bowel sounds are normal. There is no tenderness.  Musculoskeletal:       Right ankle: He exhibits  no swelling.       Left ankle: He exhibits no swelling.  Lymphadenopathy:    He has no cervical adenopathy.  Neurological: He is alert and oriented to person, place, and time. No cranial nerve deficit.  Skin: Skin is warm. No rash noted. Nails show no clubbing.  Psychiatric: He has a normal mood and affect.      Data Reviewed: Basic Metabolic Panel:  Recent Labs Lab 07/03/16 0801 07/03/16 1317 07/03/16 1703 07/03/16 2058 07/04/16 0049 07/04/16 0414 07/04/16 0921  NA 111* 114* 116* 115* 116*  116* 118* 116*  K 6.3* 4.9 4.8  --  4.9 4.8  --   CL 87* 86* 89*  --  89* 90*  --   CO2 19* 21* 19*  --  19* 21*  --   GLUCOSE 100* 106* 103*  --  107* 108*  --   BUN 23* 23* 22*  --  20 21*  --   CREATININE 1.11 1.32* 1.32*  --  1.14 1.20  --   CALCIUM 7.9* 7.9* 7.8*  --  7.4* 7.5*  --    Liver Function Tests:  Recent Labs Lab 07/03/16 0801  AST 69*  ALT 35  ALKPHOS 223*  BILITOT 0.6  PROT 6.4*  ALBUMIN 2.5*   CBC:  Recent Labs Lab 07/03/16 0801  WBC 10.7*  NEUTROABS 8.1*  HGB 12.0*  HCT 33.8*  MCV 83.2  PLT 307   Cardiac Enzymes:  Recent Labs Lab 07/03/16  0801  TROPONINI <0.03     Recent Results (from the past 240 hour(s))  Culture, body fluid-bottle     Status: None (Preliminary result)   Collection Time: 07/03/16  8:26 AM  Result Value Ref Range Status   Specimen Description FLUID ABDOMEN  Final   Special Requests NONE  Final   Gram Stain   Final    GRAM POSITIVE COCCI IN PAIRS IN CHAINS GRAM NEGATIVE RODS IN BOTH AEROBIC AND ANAEROBIC BOTTLES Performed at [facility]    Culture GRAM NEGATIVE RODS  Final   Report Status PENDING  Incomplete  Gram stain     Status: None   Collection Time: 07/03/16  8:26 AM  Result Value Ref Range Status   Specimen Description FLUID ABDOMEN  Final   Special Requests NONE  Final   Gram Stain   Final    MODERATE WBC PRESENT,BOTH PMN AND MONONUCLEAR ABUNDANT GRAM POSITIVE COCCI IN PAIRS IN  CHAINS MODERATE GRAM VARIABLE ROD    Report Status 07/03/2016 FINAL  Final  MRSA PCR Screening     Status: None   Collection Time: 07/03/16  4:30 PM  Result Value Ref Range Status   MRSA by PCR NEGATIVE NEGATIVE Final    Comment:        The GeneXpert MRSA Assay (FDA approved for NASAL specimens only), is one component of a comprehensive MRSA colonization surveillance program. It is not intended to diagnose MRSA infection nor to guide or monitor treatment for MRSA infections.      Studies: Dg Chest Portable 1 View  Result Date: 07/03/2016 CLINICAL DATA:  Weakness. EXAM: PORTABLE CHEST 1 VIEW COMPARISON:  Radiograph of April 30, 2016. FINDINGS: The heart size and mediastinal contours are within normal limits. Both lungs are clear. No pneumothorax or pleural effusion is noted. The visualized skeletal structures are unremarkable. IMPRESSION: No acute cardiopulmonary abnormality seen. Electronically Signed   By: Lupita Raider, M.D.   On: 07/03/2016 08:14    Scheduled Meds: . cefTRIAXone (ROCEPHIN)  IV  1 g Intravenous Q24H  . enoxaparin (LOVENOX) injection  40 mg Subcutaneous Q24H  . furosemide  40 mg Oral BID  . lactulose  10 g Oral Daily  . LORazepam  0.5 mg Oral BID  . nadolol  20 mg Oral Daily  . pantoprazole  40 mg Oral Daily  . sodium chloride  1 g Oral TID WC  . [START ON 07/05/2016] spironolactone  25 mg Oral Daily    Assessment/Plan:  1. Acute on chronic hyponatremia. Patient was started on hypertonic saline. Continue sodium chloride tablets. Lasix and spironolactone continued. 2. Spontaneous bacterial peritonitis. Continue empiric Rocephin. Follow-up cultures. 3. Cirrhosis. Continue nadolol Lasix and spironolactone. Also continue lactulose. 4. Essential hypertension on atenolol 5. GERD on Protonix  Code Status:     Code Status Orders        Start     Ordered   07/03/16 1239  Do not attempt resuscitation (DNR)  Continuous    Question Answer Comment  In the  event of cardiac or respiratory ARREST Do not call a "code blue"   In the event of cardiac or respiratory ARREST Do not perform Intubation, CPR, defibrillation or ACLS   In the event of cardiac or respiratory ARREST Use medication by any route, position, wound care, and other measures to relive pain and suffering. May use oxygen, suction and manual treatment of airway obstruction as needed for comfort.      07/03/16 1238  Code Status History    Date Active Date Inactive Code Status Order ID Comments User Context   04/26/2016  3:36 PM 05/01/2016  6:32 PM DNR [id number]  Milagros Loll, MD ED   03/14/2016 12:30 PM 03/17/2016 10:33 PM DNR [id number]  Suan Halter, MD Inpatient   03/12/2016  8:08 PM 03/14/2016 12:29 PM Full Code [id number]  Auburn Bilberry, MD ED     Family Communication: Sr. at the bedside Disposition Plan: To be determined based on clinical course  Consultants:  Nephrology  Antibiotics:  Rocephin  Time spent:    Alford Highland  [employer]

## 2016-07-04 NOTE — Consult Note (Signed)
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  He is wearing a bag.  He was admitted with altered mental status due to sodium level of 111.  Today it is up to 118.  He knows my name and can converse with me.    Chest exam shows good breath sounds.  abd with bag present. Ascites Fluid with Greater than 2000 WBC.  [facility] to [facility] for possible [facility].

## 2016-07-04 NOTE — Progress Notes (Signed)
Please call Dr. Wynelle LinkKolluru with sodium results. Bo McclintockBrewer,Ricquel Foulk S, RN

## 2016-07-04 NOTE — Care Management Important Message (Signed)
Important Message  Patient Details  Name: Jeff Preston MRN: [id number] Date of Birth: [birth year]   Medicare Important Message Given:  Yes    Gwenette GreetBrenda S Adamarys Shall, RN 07/04/2016, 8:26 AM

## 2016-07-04 NOTE — Progress Notes (Signed)
  PT Cancellation Note  Patient Details Name: Jeff Preston MRN: [id number] DOB: 1946/03/28   Cancelled Treatment:    Reason Eval/Treat Not Completed: Patient not medically ready: Orders received, chart reviewed, patient held until next blood draw due to hyponatremia at 118. Will re-attempt if medically appropriate after next lab values posted.    Kunaal Walkins 07/04/2016, 8:30 AM

## 2016-07-04 NOTE — Care Management (Signed)
Admitted to [facility] with the diagnosis of hyponatremia. Sister [place] [phone] or [phone]). Son lives with him. Last seen Dr. Mechele CollinElliott a couple of months ago. Released from [facility] a few weeks ago. No home oxygen. Receiving Home Health, unable to recall name of agency. Uses a cane to aid in ambulation. Takes care of all basic activities of daily living himself, doesn't drive. No falls. Appetite is better since coming to the hospital. [facility] on [address]. Sister will transport. Gwenette GreetBrenda S Lucilla Petrenko RN MSN CCM Care Management [phone]

## 2016-07-05 LAB — SODIUM
SODIUM: 120 mmol/L — AB (ref 135–145)
SODIUM: 121 mmol/L — AB (ref 135–145)
SODIUM: 122 mmol/L — AB (ref 135–145)
SODIUM: 123 mmol/L — AB (ref 135–145)

## 2016-07-05 NOTE — Consult Note (Signed)
Pt feeling better, eating well, VSS afebrile.  abd drainage still in process.  Sodium level rising up to 122.  [facility].  If not able to be accepted there I may try to get him in their clinic.

## 2016-07-05 NOTE — Progress Notes (Signed)
Patient ID: Jeff BreslowDouglas W Preston, male   DOB: Sep 21, 1946, 70 y.o.   MRN: [id number]  [employer] PROGRESS NOTE  Jeff Preston WUJ:[id number] DOB: Sep 21, 1946 DOA: 07/03/2016 PCP: Jeff PrudeELLIOTT, [name], MD  HPI/Subjective: Patient feeling better today than yesterday. Was feeling very weak when he came in. He was found to have a very low sodium on presentation. He states appetite is better today.  Objective: Vitals:   07/04/16 2019 07/05/16 0529  BP: 133/63 (!) 119/57  Pulse: 72 78  Resp: 18 16  Temp: 97.9 F (36.6 C) 98.3 F (36.8 C)    Filed Weights   07/03/16 0743 07/03/16 1242  Weight: 87.5 kg (193 lb) 79.6 kg (175 lb 6.4 oz)    ROS: Review of Systems  Constitutional: Negative for chills and fever.  Eyes: Negative for blurred vision.  Respiratory: Negative for cough and shortness of breath.   Cardiovascular: Negative for chest pain.  Gastrointestinal: Negative for abdominal pain, constipation, diarrhea, nausea and vomiting.  Genitourinary: Negative for dysuria.  Musculoskeletal: Negative for joint pain.  Neurological: Positive for weakness. Negative for dizziness and headaches.  Psychiatric/Behavioral: Negative for memory loss.   Exam: Physical Exam  Constitutional: He is oriented to person, place, and time.  HENT:  Nose: No mucosal edema.  Mouth/Throat: No oropharyngeal exudate or posterior oropharyngeal edema.  Eyes: Conjunctivae, EOM and lids are normal. Pupils are equal, round, and reactive to light.  Neck: No JVD present. Carotid bruit is not present. No edema present. No thyroid mass and no thyromegaly present.  Cardiovascular: S1 normal and S2 normal.  Exam reveals no gallop.   Murmur heard.  Systolic murmur is present with a grade of 2/6  Pulses:      Dorsalis pedis pulses are 2+ on the right side, and 2+ on the left side.  Respiratory: No respiratory distress. He has no wheezes. He has no rhonchi. He has no rales.  GI: Soft. Bowel sounds are normal. There is no  tenderness.    drainge bag at the umbilius  Musculoskeletal:       Right ankle: He exhibits no swelling.       Left ankle: He exhibits no swelling.  Lymphadenopathy:    He has no cervical adenopathy.  Neurological: He is alert and oriented to person, place, and time. No cranial nerve deficit.  Skin: Skin is warm. No rash noted. Nails show no clubbing.  Psychiatric: He has a normal mood and affect.      Data Reviewed: Basic Metabolic Panel:  Recent Labs Lab 07/03/16 0801 07/03/16 1317 07/03/16 1703  07/04/16 0049 07/04/16 0414 07/04/16 0921 07/04/16 1452 07/04/16 2115 07/05/16 0300 07/05/16 0844  NA 111* 114* 116*  < > 116*  116* 118* 116* 118* 118* 120* 121*  K 6.3* 4.9 4.8  --  4.9 4.8  --   --   --   --   --   CL 87* 86* 89*  --  89* 90*  --   --   --   --   --   CO2 19* 21* 19*  --  19* 21*  --   --   --   --   --   GLUCOSE 100* 106* 103*  --  107* 108*  --   --   --   --   --   BUN 23* 23* 22*  --  20 21*  --   --   --   --   --   CREATININE  1.11 1.32* 1.32*  --  1.14 1.20  --   --   --   --   --   CALCIUM 7.9* 7.9* 7.8*  --  7.4* 7.5*  --   --   --   --   --   < > = values in this interval not displayed. Liver Function Tests:  Recent Labs Lab 07/03/16 0801  AST 69*  ALT 35  ALKPHOS 223*  BILITOT 0.6  PROT 6.4*  ALBUMIN 2.5*   CBC:  Recent Labs Lab 07/03/16 0801 07/04/16 1717  WBC 10.7* 8.1  NEUTROABS 8.1*  --   HGB 12.0* 11.4*  HCT 33.8* 33.1*  MCV 83.2 83.6  PLT 307 228   Cardiac Enzymes:  Recent Labs Lab 07/03/16 0801  TROPONINI <0.03     Recent Results (from the past 240 hour(s))  Culture, body fluid-bottle     Status: Abnormal (Preliminary result)   Collection Time: 07/03/16  8:26 AM  Result Value Ref Range Status   Specimen Description FLUID ABDOMEN  Final   Special Requests NONE  Final   Gram Stain   Final    GRAM POSITIVE COCCI IN PAIRS IN CHAINS GRAM NEGATIVE RODS IN BOTH AEROBIC AND ANAEROBIC BOTTLES    Culture (A)   Final    PROTEUS MIRABILIS GRAM NEGATIVE RODS IDENTIFICATION AND SUSCEPTIBILITIES TO FOLLOW Performed at [facility]    Report Status PENDING  Incomplete   Organism ID, Bacteria PROTEUS MIRABILIS  Final      Susceptibility   Proteus mirabilis - MIC*    AMPICILLIN <=2 SENSITIVE Sensitive     CEFAZOLIN <=4 SENSITIVE Sensitive     CEFEPIME <=1 SENSITIVE Sensitive     CEFTAZIDIME <=1 SENSITIVE Sensitive     CEFTRIAXONE <=1 SENSITIVE Sensitive     CIPROFLOXACIN <=0.25 SENSITIVE Sensitive     GENTAMICIN <=1 SENSITIVE Sensitive     IMIPENEM 4 SENSITIVE Sensitive     TRIMETH/SULFA <=20 SENSITIVE Sensitive     AMPICILLIN/SULBACTAM <=2 SENSITIVE Sensitive     PIP/TAZO <=4 SENSITIVE Sensitive     * PROTEUS MIRABILIS  Gram stain     Status: None   Collection Time: 07/03/16  8:26 AM  Result Value Ref Range Status   Specimen Description FLUID ABDOMEN  Final   Special Requests NONE  Final   Gram Stain   Final    MODERATE WBC PRESENT,BOTH PMN AND MONONUCLEAR ABUNDANT GRAM POSITIVE COCCI IN PAIRS IN CHAINS MODERATE GRAM VARIABLE ROD    Report Status 07/03/2016 FINAL  Final  MRSA PCR Screening     Status: None   Collection Time: 07/03/16  4:30 PM  Result Value Ref Range Status   MRSA by PCR NEGATIVE NEGATIVE Final    Comment:        The GeneXpert MRSA Assay (FDA approved for NASAL specimens only), is one component of a comprehensive MRSA colonization surveillance program. It is not intended to diagnose MRSA infection nor to guide or monitor treatment for MRSA infections.      Studies: No results found.  Scheduled Meds: . cefTRIAXone (ROCEPHIN)  IV  1 g Intravenous Q24H  . enoxaparin (LOVENOX) injection  40 mg Subcutaneous Q24H  . furosemide  40 mg Oral BID  . lactulose  10 g Oral Daily  . LORazepam  0.5 mg Oral BID  . nadolol  20 mg Oral Daily  . pantoprazole  40 mg Oral Daily  . sodium chloride  1 g Oral TID WC  .  spironolactone  25 mg Oral Daily     Assessment/Plan:  1. Acute on chronic hyponatremia.Sodium continues to improve. Continue sodium chloride tablets. Lasix and spironolactone continued. Follow sodium level in the morning appreciate nephrology input 2. Bacterial peritonitis with Proteus, continue Rocephin. 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 Mechele Collin will be contacting [facility] for possible transfer  3. Cirrhosis. Continue nadolol Lasix and spironolactone. Also continue lactulose. 4. Essential hypertension on atenolol 5. GERD on Protonix  Code Status:     Code Status Orders        Start     Ordered   07/03/16 1239  Do not attempt resuscitation (DNR)  Continuous    Question Answer Comment  In the event of cardiac or respiratory ARREST Do not call a "code blue"   In the event of cardiac or respiratory ARREST Do not perform Intubation, CPR, defibrillation or ACLS   In the event of cardiac or respiratory ARREST Use medication by any route, position, wound care, and other measures to relive pain and suffering. May use oxygen, suction and manual treatment of airway obstruction as needed for comfort.      07/03/16 1238    Code Status History    Date Active Date Inactive Code Status Order ID Comments User Context   04/26/2016  3:36 PM 05/01/2016  6:32 PM DNR [id number]  Milagros Loll, MD ED   03/14/2016 12:30 PM 03/17/2016 10:33 PM DNR [id number]  Suan Halter, MD Inpatient   03/12/2016  8:08 PM 03/14/2016 12:29 PM Full Code [id number]  Auburn Bilberry, MD ED     Family Communication: Sr. at the bedside Disposition Plan: To be determined based on clinical course  Consultants:  Nephrology  Antibiotics:  Rocephin  Time spent:    Hasheem Voland, [facility]  [employer]

## 2016-07-05 NOTE — Plan of Care (Signed)
Problem: Bowel/Gastric: Goal: Will not experience complications related to bowel motility Outcome: Progressing BM x 3 this shift due to lactulose

## 2016-07-05 NOTE — Progress Notes (Signed)
[facility]  ROUNDING NOTE   Subjective:   Na 121  Objective:  Vital signs in last 24 hours:  Temp:  [97.4 F (36.3 C)-98.3 F (36.8 C)] 98.3 F (36.8 C) (08/26 0529) Pulse Rate:  [61-78] 78 (08/26 0529) Resp:  [16-18] 16 (08/26 0529) BP: (101-133)/(57-63) 119/57 (08/26 0529) SpO2:  [100 %] 100 % (08/26 0529)  Weight change:  Filed Weights   07/03/16 0743 07/03/16 1242  Weight: 87.5 kg (193 lb) 79.6 kg (175 lb 6.4 oz)    Intake/Output: I/O last 3 completed shifts: In: 2193.8 [P.O.:1080; I.V.:1013.8; IV Piggyback:100] Out: 1 [Other:1]   Intake/Output this shift:  Total I/O In: 240 [P.O.:240] Out: 400 [Urine:400]  Physical Exam: General: Cachectic  Head: Normocephalic, atraumatic. Moist oral mucosal membranes  Eyes: Anicteric, PERRL  Neck: Supple, trachea midline  Lungs:  Clear to auscultation  Heart: Regular rate and rhythm  Abdomen:   +umbilical hernia with draining peritoneal fluid  Extremities: no peripheral edema.  Neurologic: alert  Skin: No lesions       Basic Metabolic Panel:  Recent Labs Lab 07/03/16 0801 07/03/16 1317 07/03/16 1703  07/04/16 0049 07/04/16 0414 07/04/16 0921 07/04/16 1452 07/04/16 2115 07/05/16 0300 07/05/16 0844  NA 111* 114* 116*  < > 116*  116* 118* 116* 118* 118* 120* 121*  K 6.3* 4.9 4.8  --  4.9 4.8  --   --   --   --   --   CL 87* 86* 89*  --  89* 90*  --   --   --   --   --   CO2 19* 21* 19*  --  19* 21*  --   --   --   --   --   GLUCOSE 100* 106* 103*  --  107* 108*  --   --   --   --   --   BUN 23* 23* 22*  --  20 21*  --   --   --   --   --   CREATININE 1.11 1.32* 1.32*  --  1.14 1.20  --   --   --   --   --   CALCIUM 7.9* 7.9* 7.8*  --  7.4* 7.5*  --   --   --   --   --   < > = values in this interval not displayed.  Liver Function Tests:  Recent Labs Lab 07/03/16 0801  AST 69*  ALT 35  ALKPHOS 223*  BILITOT 0.6  PROT 6.4*  ALBUMIN 2.5*   No results for input(s): LIPASE, AMYLASE in  the last 168 hours. No results for input(s): AMMONIA in the last 168 hours.  CBC:  Recent Labs Lab 07/03/16 0801 07/04/16 1717  WBC 10.7* 8.1  NEUTROABS 8.1*  --   HGB 12.0* 11.4*  HCT 33.8* 33.1*  MCV 83.2 83.6  PLT 307 228    Cardiac Enzymes:  Recent Labs Lab 07/03/16 0801  TROPONINI <0.03    BNP: Invalid input(s): POCBNP  CBG: No results for input(s): GLUCAP in the last 168 hours.  Microbiology: Results for orders placed or performed during the hospital encounter of 07/03/16  Culture, body fluid-bottle     Status: Abnormal (Preliminary result)   Collection Time: 07/03/16  8:26 AM  Result Value Ref Range Status   Specimen Description FLUID ABDOMEN  Final   Special Requests NONE  Final   Gram Stain   Final    GRAM POSITIVE  COCCI IN PAIRS IN CHAINS GRAM NEGATIVE RODS IN BOTH AEROBIC AND ANAEROBIC BOTTLES    Culture (A)  Final    PROTEUS MIRABILIS GRAM NEGATIVE RODS IDENTIFICATION AND SUSCEPTIBILITIES TO FOLLOW Performed at [facility]    Report Status PENDING  Incomplete   Organism ID, Bacteria PROTEUS MIRABILIS  Final      Susceptibility   Proteus mirabilis - MIC*    AMPICILLIN <=2 SENSITIVE Sensitive     CEFAZOLIN <=4 SENSITIVE Sensitive     CEFEPIME <=1 SENSITIVE Sensitive     CEFTAZIDIME <=1 SENSITIVE Sensitive     CEFTRIAXONE <=1 SENSITIVE Sensitive     CIPROFLOXACIN <=0.25 SENSITIVE Sensitive     GENTAMICIN <=1 SENSITIVE Sensitive     IMIPENEM 4 SENSITIVE Sensitive     TRIMETH/SULFA <=20 SENSITIVE Sensitive     AMPICILLIN/SULBACTAM <=2 SENSITIVE Sensitive     PIP/TAZO <=4 SENSITIVE Sensitive     * PROTEUS MIRABILIS  Gram stain     Status: None   Collection Time: 07/03/16  8:26 AM  Result Value Ref Range Status   Specimen Description FLUID ABDOMEN  Final   Special Requests NONE  Final   Gram Stain   Final    MODERATE WBC PRESENT,BOTH PMN AND MONONUCLEAR ABUNDANT GRAM POSITIVE COCCI IN PAIRS IN CHAINS MODERATE GRAM VARIABLE ROD     Report Status 07/03/2016 FINAL  Final  MRSA PCR Screening     Status: None   Collection Time: 07/03/16  4:30 PM  Result Value Ref Range Status   MRSA by PCR NEGATIVE NEGATIVE Final    Comment:        The GeneXpert MRSA Assay (FDA approved for NASAL specimens only), is one component of a comprehensive MRSA colonization surveillance program. It is not intended to diagnose MRSA infection nor to guide or monitor treatment for MRSA infections.     Coagulation Studies: No results for input(s): LABPROT, INR in the last 72 hours.  Urinalysis:  Recent Labs  07/03/16 0826  COLORURINE STRAW*  LABSPEC 1.006  PHURINE 6.0  GLUCOSEU NEGATIVE  HGBUR NEGATIVE  BILIRUBINUR NEGATIVE  KETONESUR NEGATIVE  PROTEINUR NEGATIVE  NITRITE NEGATIVE  LEUKOCYTESUR NEGATIVE      Imaging: No results found.   Medications:     . cefTRIAXone (ROCEPHIN)  IV  1 g Intravenous Q24H  . enoxaparin (LOVENOX) injection  40 mg Subcutaneous Q24H  . furosemide  40 mg Oral BID  . lactulose  10 g Oral Daily  . LORazepam  0.5 mg Oral BID  . nadolol  20 mg Oral Daily  . pantoprazole  40 mg Oral Daily  . sodium chloride  1 g Oral TID WC  . spironolactone  25 mg Oral Daily   bisacodyl, ondansetron, simethicone, traMADol, zolpidem  Assessment/ Plan:  [name] is a 70 y.o. 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 Acute on chronic hyponatremia: Baseline Na 125-130 - sodium tablets - Fluid restriction - Continue furosemide and spironolactone  2. Hyperkalemia and metabolic acidosis: on oral potassium supplements at home.  - hold potassium  - monitor GI losses from ostomy.  3. End Stage Liver Disease: hepatic cirrhosis: overall prognosis is poor. Palliative care consulted. Appreciate GI input.    LOS: 2 Harshita Bernales 8/26/201710:55 AM

## 2016-07-05 NOTE — Progress Notes (Addendum)
[facility] transfer center needed to speak to Dr. Mechele CollinElliott for physician referral for transfer.  Dr. Mechele CollinElliott paged x 3, no answer, Dr. Retta MacSh. Patel paged and referred back to dr. Mechele CollinElliott.  Will report this to on coming RN to pass to Dr. Mechele CollinElliott tomorrow, Charge nurse R. Maisie Fushomas RN informed.   Dr. Mechele CollinElliott called back and [facility]

## 2016-07-06 LAB — BASIC METABOLIC PANEL
Anion gap: 8 (ref 5–15)
BUN: 26 mg/dL — ABNORMAL HIGH (ref 6–20)
CO2: 22 mmol/L (ref 22–32)
Calcium: 7.8 mg/dL — ABNORMAL LOW (ref 8.9–10.3)
Chloride: 95 mmol/L — ABNORMAL LOW (ref 101–111)
Creatinine, Ser: 1.73 mg/dL — ABNORMAL HIGH (ref 0.61–1.24)
GFR calc Af Amer: 45 mL/min — ABNORMAL LOW (ref >60)
GFR calc non Af Amer: 39 mL/min — ABNORMAL LOW (ref >60)
Glucose, Bld: 112 mg/dL — ABNORMAL HIGH (ref 65–99)
Potassium: 4.4 mmol/L (ref 3.5–5.1)
Sodium: 125 mmol/L — ABNORMAL LOW (ref 135–145)

## 2016-07-06 LAB — SODIUM
SODIUM: 123 mmol/L — AB (ref 135–145)
Sodium: 125 mmol/L — ABNORMAL LOW (ref 135–145)

## 2016-07-06 MED ORDER — OFLOXACIN 0.3 % OP SOLN
1.0000 [drp] | Freq: Four times a day (QID) | OPHTHALMIC | Status: DC
  Administered 2016-07-06 – 2016-07-08 (×7): 1 [drp] via OPHTHALMIC
  Filled 2016-07-06: qty 5

## 2016-07-06 NOTE — Progress Notes (Signed)
[facility]  ROUNDING NOTE   Subjective:   Na 125 Patient to be transferred to [facility]  Objective:  Vital signs in last 24 hours:  Temp:  [97.7 F (36.5 C)-98.2 F (36.8 C)] 98 F (36.7 C) (08/27 0549) Pulse Rate:  [68-86] 86 (08/27 0549) Resp:  [18] 18 (08/26 2112) BP: (100-136)/(49-67) 136/67 (08/27 0549) SpO2:  [100 %] 100 % (08/27 0549)  Weight change:  Filed Weights   07/03/16 0743 07/03/16 1242  Weight: 87.5 kg (193 lb) 79.6 kg (175 lb 6.4 oz)    Intake/Output: I/O last 3 completed shifts: In: 1260 [P.O.:1260] Out: 950 [Other:950]   Intake/Output this shift:  Total I/O In: 240 [P.O.:240] Out: -   Physical Exam: General: Cachectic  Head: Normocephalic, atraumatic. Moist oral mucosal membranes  Eyes: Anicteric, PERRL  Neck: Supple, trachea midline  Lungs:  Clear to auscultation  Heart: Regular rate and rhythm  Abdomen:   +umbilical hernia with draining peritoneal fluid  Extremities: no peripheral edema.  Neurologic: alert  Skin: No lesions       Basic Metabolic Panel:  Recent Labs Lab 07/03/16 1317 07/03/16 1703  07/04/16 0049 07/04/16 0414  07/05/16 0844 07/05/16 1457 07/05/16 2052 07/06/16 0258 07/06/16 0903  NA 114* 116*  < > 116*  116* 118*  < > 121* 122* 123* 125* 125*  K 4.9 4.8  --  4.9 4.8  --   --   --   --  4.4  --   CL 86* 89*  --  89* 90*  --   --   --   --  95*  --   CO2 21* 19*  --  19* 21*  --   --   --   --  22  --   GLUCOSE 106* 103*  --  107* 108*  --   --   --   --  112*  --   BUN 23* 22*  --  20 21*  --   --   --   --  26*  --   CREATININE 1.32* 1.32*  --  1.14 1.20  --   --   --   --  1.73*  --   CALCIUM 7.9* 7.8*  --  7.4* 7.5*  --   --   --   --  7.8*  --   < > = values in this interval not displayed.  Liver Function Tests:  Recent Labs Lab 07/03/16 0801  AST 69*  ALT 35  ALKPHOS 223*  BILITOT 0.6  PROT 6.4*  ALBUMIN 2.5*   No results for input(s): LIPASE, AMYLASE in the last 168 hours. No  results for input(s): AMMONIA in the last 168 hours.  CBC:  Recent Labs Lab 07/03/16 0801 07/04/16 1717  WBC 10.7* 8.1  NEUTROABS 8.1*  --   HGB 12.0* 11.4*  HCT 33.8* 33.1*  MCV 83.2 83.6  PLT 307 228    Cardiac Enzymes:  Recent Labs Lab 07/03/16 0801  TROPONINI <0.03    BNP: Invalid input(s): POCBNP  CBG: No results for input(s): GLUCAP in the last 168 hours.  Microbiology: Results for orders placed or performed during the hospital encounter of 07/03/16  Culture, body fluid-bottle     Status: Abnormal (Preliminary result)   Collection Time: 07/03/16  8:26 AM  Result Value Ref Range Status   Specimen Description FLUID ABDOMEN  Final   Special Requests NONE  Final   Gram Stain   Final  GRAM POSITIVE COCCI IN PAIRS IN CHAINS GRAM NEGATIVE RODS IN BOTH AEROBIC AND ANAEROBIC BOTTLES Performed at [facility]    Culture PROTEUS MIRABILIS KLEBSIELLA PNEUMONIAE  (A)  Final   Report Status PENDING  Incomplete   Organism ID, Bacteria PROTEUS MIRABILIS  Final   Organism ID, Bacteria KLEBSIELLA PNEUMONIAE  Final      Susceptibility   Klebsiella pneumoniae - MIC*    AMPICILLIN >=32 RESISTANT Resistant     CEFAZOLIN <=4 SENSITIVE Sensitive     CEFEPIME <=1 SENSITIVE Sensitive     CEFTAZIDIME <=1 SENSITIVE Sensitive     CEFTRIAXONE <=1 SENSITIVE Sensitive     CIPROFLOXACIN <=0.25 SENSITIVE Sensitive     GENTAMICIN <=1 SENSITIVE Sensitive     IMIPENEM <=0.25 SENSITIVE Sensitive     TRIMETH/SULFA >=320 RESISTANT Resistant     AMPICILLIN/SULBACTAM >=32 RESISTANT Resistant     PIP/TAZO 16 SENSITIVE Sensitive     Extended ESBL NEGATIVE Sensitive     * KLEBSIELLA PNEUMONIAE   Proteus mirabilis - MIC*    AMPICILLIN <=2 SENSITIVE Sensitive     CEFAZOLIN <=4 SENSITIVE Sensitive     CEFEPIME <=1 SENSITIVE Sensitive     CEFTAZIDIME <=1 SENSITIVE Sensitive     CEFTRIAXONE <=1 SENSITIVE Sensitive     CIPROFLOXACIN <=0.25 SENSITIVE Sensitive     GENTAMICIN <=1  SENSITIVE Sensitive     IMIPENEM 4 SENSITIVE Sensitive     TRIMETH/SULFA <=20 SENSITIVE Sensitive     AMPICILLIN/SULBACTAM <=2 SENSITIVE Sensitive     PIP/TAZO <=4 SENSITIVE Sensitive     * PROTEUS MIRABILIS  Gram stain     Status: None   Collection Time: 07/03/16  8:26 AM  Result Value Ref Range Status   Specimen Description FLUID ABDOMEN  Final   Special Requests NONE  Final   Gram Stain   Final    MODERATE WBC PRESENT,BOTH PMN AND MONONUCLEAR ABUNDANT GRAM POSITIVE COCCI IN PAIRS IN CHAINS MODERATE GRAM VARIABLE ROD    Report Status 07/03/2016 FINAL  Final  MRSA PCR Screening     Status: None   Collection Time: 07/03/16  4:30 PM  Result Value Ref Range Status   MRSA by PCR NEGATIVE NEGATIVE Final    Comment:        The GeneXpert MRSA Assay (FDA approved for NASAL specimens only), is one component of a comprehensive MRSA colonization surveillance program. It is not intended to diagnose MRSA infection nor to guide or monitor treatment for MRSA infections.     Coagulation Studies: No results for input(s): LABPROT, INR in the last 72 hours.  Urinalysis: No results for input(s): COLORURINE, LABSPEC, PHURINE, GLUCOSEU, HGBUR, BILIRUBINUR, KETONESUR, PROTEINUR, UROBILINOGEN, NITRITE, LEUKOCYTESUR in the last 72 hours.  Invalid input(s): APPERANCEUR    Imaging: No results found.   Medications:     . cefTRIAXone (ROCEPHIN)  IV  1 g Intravenous Q24H  . enoxaparin (LOVENOX) injection  40 mg Subcutaneous Q24H  . furosemide  40 mg Oral BID  . lactulose  10 g Oral Daily  . LORazepam  0.5 mg Oral BID  . nadolol  20 mg Oral Daily  . pantoprazole  40 mg Oral Daily  . sodium chloride  1 g Oral TID WC  . spironolactone  25 mg Oral Daily   bisacodyl, ondansetron, simethicone, traMADol, zolpidem  Assessment/ Plan:  [name] is a 70 y.o. 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 Acute on chronic hyponatremia: Baseline Na 125-130 - sodium tablets - Fluid restriction - Continue furosemide and spironolactone  2. Acute Renal Failure on chronic kidney disease stage III: rise in creatinine to 1.73 today. Recent episode of hypotension.  - Continue to monitor.   3. Hyperkalemia and metabolic acidosis: on oral potassium supplements at home. Potassium now at goal - hold potassium  - monitor GI losses from ostomy.   LOS: 3 Corisa Montini 8/27/201710:45 AM

## 2016-07-06 NOTE — Progress Notes (Signed)
[facility] this RN to see if pt still needed transferring and if pt condition had changed.  Made her aware pt condition is the same and still needing transfer.  No bed at this time.

## 2016-07-06 NOTE — Progress Notes (Signed)
Patient ID: Jeff BreslowDouglas W Lusby, male   DOB: 08-09-1946, 70 y.o.   MRN: [id number]  [employer] PROGRESS NOTE  Jeff BreslowDouglas W Lemonds YQM:[id number] DOB: 08-09-1946 DOA: 07/03/2016 PCP: Lynnae PrudeELLIOTT, [name], MD  HPI/Subjective: Patient feeling better sodium is improved denies any chest pains  Objective: Vitals:   07/05/16 2112 07/06/16 0549  BP: (!) 100/49 136/67  Pulse: 69 86  Resp: 18   Temp: 98.2 F (36.8 C) 98 F (36.7 C)    Filed Weights   07/03/16 0743 07/03/16 1242  Weight: 87.5 kg (193 lb) 79.6 kg (175 lb 6.4 oz)    ROS: Review of Systems  Constitutional: Negative for chills and fever.  Eyes: Negative for blurred vision.  Respiratory: Negative for cough and shortness of breath.   Cardiovascular: Negative for chest pain.  Gastrointestinal: Negative for abdominal pain, constipation, diarrhea, nausea and vomiting.  Genitourinary: Negative for dysuria.  Musculoskeletal: Negative for joint pain.  Neurological: Positive for weakness. Negative for dizziness and headaches.  Psychiatric/Behavioral: Negative for memory loss.   Exam: Physical Exam  Constitutional: He is oriented to person, place, and time.  HENT:  Nose: No mucosal edema.  Mouth/Throat: No oropharyngeal exudate or posterior oropharyngeal edema.  Eyes: Conjunctivae, EOM and lids are normal. Pupils are equal, round, and reactive to light.  Neck: No JVD present. Carotid bruit is not present. No edema present. No thyroid mass and no thyromegaly present.  Cardiovascular: S1 normal and S2 normal.  Exam reveals no gallop.   Murmur heard.  Systolic murmur is present with a grade of 2/6  Pulses:      Dorsalis pedis pulses are 2+ on the right side, and 2+ on the left side.  Respiratory: No respiratory distress. He has no wheezes. He has no rhonchi. He has no rales.  GI: Soft. Bowel sounds are normal. There is no tenderness.    drainge bag at the umbilius  Musculoskeletal:       Right ankle: He exhibits no swelling.     Left ankle: He exhibits no swelling.  Lymphadenopathy:    He has no cervical adenopathy.  Neurological: He is alert and oriented to person, place, and time. No cranial nerve deficit.  Skin: Skin is warm. No rash noted. Nails show no clubbing.  Psychiatric: He has a normal mood and affect.      Data Reviewed: Basic Metabolic Panel:  Recent Labs Lab 07/03/16 1317 07/03/16 1703  07/04/16 0049 07/04/16 [id number]  07/05/16 [id number] 07/05/16 1457 07/05/16 2052 07/06/16 0258 07/06/16 0903  NA 114* 116*  < > 116*  116* 118*  < > 121* 122* 123* 125* 125*  K 4.9 4.8  --  4.9 4.8  --   --   --   --  4.4  --   CL 86* 89*  --  89* 90*  --   --   --   --  95*  --   CO2 21* 19*  --  19* 21*  --   --   --   --  22  --   GLUCOSE 106* 103*  --  107* 108*  --   --   --   --  112*  --   BUN 23* 22*  --  20 21*  --   --   --   --  26*  --   CREATININE 1.32* 1.32*  --  1.14 1.20  --   --   --   --  1.73*  --  CALCIUM 7.9* 7.8*  --  7.4* 7.5*  --   --   --   --  7.8*  --   < > = values in this interval not displayed. Liver Function Tests:  Recent Labs Lab 07/03/16 0801  AST 69*  ALT 35  ALKPHOS 223*  BILITOT 0.6  PROT 6.4*  ALBUMIN 2.5*   CBC:  Recent Labs Lab 07/03/16 0801 07/04/16 1717  WBC 10.7* 8.1  NEUTROABS 8.1*  --   HGB 12.0* 11.4*  HCT 33.8* 33.1*  MCV 83.2 83.6  PLT 307 228   Cardiac Enzymes:  Recent Labs Lab 07/03/16 0801  TROPONINI <0.03     Recent Results (from the past 240 hour(s))  Culture, body fluid-bottle     Status: Abnormal (Preliminary result)   Collection Time: 07/03/16  8:26 AM  Result Value Ref Range Status   Specimen Description FLUID ABDOMEN  Final   Special Requests NONE  Final   Gram Stain   Final    GRAM POSITIVE COCCI IN PAIRS IN CHAINS GRAM NEGATIVE RODS IN BOTH AEROBIC AND ANAEROBIC BOTTLES Performed at [facility]    Culture PROTEUS MIRABILIS KLEBSIELLA PNEUMONIAE  (A)  Final   Report Status PENDING  Incomplete   Organism  ID, Bacteria PROTEUS MIRABILIS  Final   Organism ID, Bacteria KLEBSIELLA PNEUMONIAE  Final      Susceptibility   Klebsiella pneumoniae - MIC*    AMPICILLIN >=32 RESISTANT Resistant     CEFAZOLIN <=4 SENSITIVE Sensitive     CEFEPIME <=1 SENSITIVE Sensitive     CEFTAZIDIME <=1 SENSITIVE Sensitive     CEFTRIAXONE <=1 SENSITIVE Sensitive     CIPROFLOXACIN <=0.25 SENSITIVE Sensitive     GENTAMICIN <=1 SENSITIVE Sensitive     IMIPENEM <=0.25 SENSITIVE Sensitive     TRIMETH/SULFA >=320 RESISTANT Resistant     AMPICILLIN/SULBACTAM >=32 RESISTANT Resistant     PIP/TAZO 16 SENSITIVE Sensitive     Extended ESBL NEGATIVE Sensitive     * KLEBSIELLA PNEUMONIAE   Proteus mirabilis - MIC*    AMPICILLIN <=2 SENSITIVE Sensitive     CEFAZOLIN <=4 SENSITIVE Sensitive     CEFEPIME <=1 SENSITIVE Sensitive     CEFTAZIDIME <=1 SENSITIVE Sensitive     CEFTRIAXONE <=1 SENSITIVE Sensitive     CIPROFLOXACIN <=0.25 SENSITIVE Sensitive     GENTAMICIN <=1 SENSITIVE Sensitive     IMIPENEM 4 SENSITIVE Sensitive     TRIMETH/SULFA <=20 SENSITIVE Sensitive     AMPICILLIN/SULBACTAM <=2 SENSITIVE Sensitive     PIP/TAZO <=4 SENSITIVE Sensitive     * PROTEUS MIRABILIS  Gram stain     Status: None   Collection Time: 07/03/16  8:26 AM  Result Value Ref Range Status   Specimen Description FLUID ABDOMEN  Final   Special Requests NONE  Final   Gram Stain   Final    MODERATE WBC PRESENT,BOTH PMN AND MONONUCLEAR ABUNDANT GRAM POSITIVE COCCI IN PAIRS IN CHAINS MODERATE GRAM VARIABLE ROD    Report Status 07/03/2016 FINAL  Final  MRSA PCR Screening     Status: None   Collection Time: 07/03/16  4:30 PM  Result Value Ref Range Status   MRSA by PCR NEGATIVE NEGATIVE Final    Comment:        The GeneXpert MRSA Assay (FDA approved for NASAL specimens only), is one component of a comprehensive MRSA colonization surveillance program. It is not intended to diagnose MRSA infection nor to guide or monitor treatment  for  MRSA infections.      Studies: No results found.  Scheduled Meds: . cefTRIAXone (ROCEPHIN)  IV  1 g Intravenous Q24H  . enoxaparin (LOVENOX) injection  40 mg Subcutaneous Q24H  . furosemide  40 mg Oral BID  . lactulose  10 g Oral Daily  . LORazepam  0.5 mg Oral BID  . nadolol  20 mg Oral Daily  . pantoprazole  40 mg Oral Daily  . sodium chloride  1 g Oral TID WC  . spironolactone  25 mg Oral Daily    Assessment/Plan:  1. Acute on chronic hyponatremia.Sodium continues to improve. Continue sodium chloride tablets. Lasix and spironolactone continued. Sodium back to his baseline  2. Bacterial peritonitis with Proteus, continue Rocephin. 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 Mechele Collin elevating transferred to [facility]  3. Cirrhosis. Continue nadolol Lasix and spironolactone. Also continue lactulose. 4. Essential hypertension on atenolol 5. GERD on Protonix  Code Status:     Code Status Orders        Start     Ordered   07/03/16 1239  Do not attempt resuscitation (DNR)  Continuous    Question Answer Comment  In the event of cardiac or respiratory ARREST Do not call a "code blue"   In the event of cardiac or respiratory ARREST Do not perform Intubation, CPR, defibrillation or ACLS   In the event of cardiac or respiratory ARREST Use medication by any route, position, wound care, and other measures to relive pain and suffering. May use oxygen, suction and manual treatment of airway obstruction as needed for comfort.      07/03/16 1238    Code Status History    Date Active Date Inactive Code Status Order ID Comments User Context   04/26/2016  3:36 PM 05/01/2016  6:32 PM DNR [id number]  Milagros Loll, MD ED   03/14/2016 12:30 PM 03/17/2016 10:33 PM DNR [id number]  Suan Halter, MD Inpatient   03/12/2016  8:08 PM 03/14/2016 12:29 PM Full Code [id number]  Auburn Bilberry, MD ED     Family Communication: Sr. at the bedside Disposition Plan: To be  determined based on clinical course  Consultants:  Nephrology  Antibiotics:  Rocephin  Time spent:    Kyrel Leighton, [facility]  [employer]

## 2016-07-06 NOTE — Consult Note (Signed)
Pt doing as well as expected.  VSS afebrile. WBC 8 hgb 11 plt 228K  Na up to 125, HCO3 up to 22, creat 1.73.  [facility]  No new complaints.

## 2016-07-07 DIAGNOSIS — Z789 Other specified health status: Secondary | ICD-10-CM

## 2016-07-07 DIAGNOSIS — Z66 Do not resuscitate: Secondary | ICD-10-CM

## 2016-07-07 DIAGNOSIS — R188 Other ascites: Secondary | ICD-10-CM

## 2016-07-07 DIAGNOSIS — Z515 Encounter for palliative care: Secondary | ICD-10-CM

## 2016-07-07 DIAGNOSIS — E871 Hypo-osmolality and hyponatremia: Principal | ICD-10-CM

## 2016-07-07 LAB — SODIUM
SODIUM: 124 mmol/L — AB (ref 135–145)
Sodium: 124 mmol/L — ABNORMAL LOW (ref 135–145)

## 2016-07-07 NOTE — Progress Notes (Signed)
[facility]  ROUNDING NOTE   Subjective:   Na 125 this morning No acute complaints No edema  No SOB  Objective:  Vital signs in last 24 hours:  Temp:  [97.7 F (36.5 C)-98 F (36.7 C)] 98 F (36.7 C) (08/28 0522) Pulse Rate:  [64-73] 73 (08/28 0955) Resp:  [16-20] 16 (08/28 0955) BP: (108-122)/(49-55) 122/55 (08/28 0955) SpO2:  [99 %-100 %] 100 % (08/28 0955)  Weight change:  Filed Weights   07/03/16 0743 07/03/16 1242  Weight: 87.5 kg (193 lb) 79.6 kg (175 lb 6.4 oz)    Intake/Output: I/O last 3 completed shifts: In: 1140 [P.O.:1140] Out: 1 [Urine:1]   Intake/Output this shift:  Total I/O In: 240 [P.O.:240] Out: -   Physical Exam: General: Cachectic  Head: Normocephalic, atraumatic. Moist oral mucosal membranes  Eyes: Anicteric,   Neck: Supple, trachea midline  Lungs:  Clear to auscultation  Heart: Regular rate and rhythm  Abdomen:   +umbilical hernia with draining peritoneal fluid  Extremities: no peripheral edema.  Neurologic: alert  Skin: No lesions       Basic Metabolic Panel:  Recent Labs Lab 07/03/16 1317 07/03/16 1703  07/04/16 0049 07/04/16 0414  07/05/16 2052 07/06/16 0258 07/06/16 0903 07/06/16 2157 07/07/16 0919  NA 114* 116*  < > 116*  116* 118*  < > 123* 125* 125* 123* 124*  K 4.9 4.8  --  4.9 4.8  --   --  4.4  --   --   --   CL 86* 89*  --  89* 90*  --   --  95*  --   --   --   CO2 21* 19*  --  19* 21*  --   --  22  --   --   --   GLUCOSE 106* 103*  --  107* 108*  --   --  112*  --   --   --   BUN 23* 22*  --  20 21*  --   --  26*  --   --   --   CREATININE 1.32* 1.32*  --  1.14 1.20  --   --  1.73*  --   --   --   CALCIUM 7.9* 7.8*  --  7.4* 7.5*  --   --  7.8*  --   --   --   < > = values in this interval not displayed.  Liver Function Tests:  Recent Labs Lab 07/03/16 0801  AST 69*  ALT 35  ALKPHOS 223*  BILITOT 0.6  PROT 6.4*  ALBUMIN 2.5*   No results for input(s): LIPASE, AMYLASE in the last 168  hours. No results for input(s): AMMONIA in the last 168 hours.  CBC:  Recent Labs Lab 07/03/16 0801 07/04/16 1717  WBC 10.7* 8.1  NEUTROABS 8.1*  --   HGB 12.0* 11.4*  HCT 33.8* 33.1*  MCV 83.2 83.6  PLT 307 228    Cardiac Enzymes:  Recent Labs Lab 07/03/16 0801  TROPONINI <0.03    BNP: Invalid input(s): POCBNP  CBG: No results for input(s): GLUCAP in the last 168 hours.  Microbiology: Results for orders placed or performed during the hospital encounter of 07/03/16  Culture, body fluid-bottle     Status: Abnormal (Preliminary result)   Collection Time: 07/03/16  8:26 AM  Result Value Ref Range Status   Specimen Description FLUID ABDOMEN  Final   Special Requests NONE  Final   Gram  Stain   Final    GRAM POSITIVE COCCI IN PAIRS IN CHAINS GRAM NEGATIVE RODS IN BOTH AEROBIC AND ANAEROBIC BOTTLES Per[facility]    Culture (A)  Final    PROTEUS MIRABILIS KLEBSIELLA PNEUMONIAE GRAM POSITIVE COCCI    Report Status PENDING  Incomplete   Organism ID, Bacteria PROTEUS MIRABILIS  Final   Organism ID, Bacteria KLEBSIELLA PNEUMONIAE  Final      Susceptibility   Klebsiella pneumoniae - MIC*    AMPICILLIN >=32 RESISTANT Resistant     CEFAZOLIN <=4 SENSITIVE Sensitive     CEFEPIME <=1 SENSITIVE Sensitive     CEFTAZIDIME <=1 SENSITIVE Sensitive     CEFTRIAXONE <=1 SENSITIVE Sensitive     CIPROFLOXACIN <=0.25 SENSITIVE Sensitive     GENTAMICIN <=1 SENSITIVE Sensitive     IMIPENEM <=0.25 SENSITIVE Sensitive     TRIMETH/SULFA >=320 RESISTANT Resistant     AMPICILLIN/SULBACTAM >=32 RESISTANT Resistant     PIP/TAZO 16 SENSITIVE Sensitive     Extended ESBL NEGATIVE Sensitive     * KLEBSIELLA PNEUMONIAE   Proteus mirabilis - MIC*    AMPICILLIN <=2 SENSITIVE Sensitive     CEFAZOLIN <=4 SENSITIVE Sensitive     CEFEPIME <=1 SENSITIVE Sensitive     CEFTAZIDIME <=1 SENSITIVE Sensitive     CEFTRIAXONE <=1 SENSITIVE Sensitive     CIPROFLOXACIN <=0.25  SENSITIVE Sensitive     GENTAMICIN <=1 SENSITIVE Sensitive     IMIPENEM 4 SENSITIVE Sensitive     TRIMETH/SULFA <=20 SENSITIVE Sensitive     AMPICILLIN/SULBACTAM <=2 SENSITIVE Sensitive     PIP/TAZO <=4 SENSITIVE Sensitive     * PROTEUS MIRABILIS  Gram stain     Status: None   Collection Time: 07/03/16  8:26 AM  Result Value Ref Range Status   Specimen Description FLUID ABDOMEN  Final   Special Requests NONE  Final   Gram Stain   Final    MODERATE WBC PRESENT,BOTH PMN AND MONONUCLEAR ABUNDANT GRAM POSITIVE COCCI IN PAIRS IN CHAINS MODERATE GRAM VARIABLE ROD    Report Status 07/03/2016 FINAL  Final  MRSA PCR Screening     Status: None   Collection Time: 07/03/16  4:30 PM  Result Value Ref Range Status   MRSA by PCR NEGATIVE NEGATIVE Final    Comment:        The GeneXpert MRSA Assay (FDA approved for NASAL specimens only), is one component of a comprehensive MRSA colonization surveillance program. It is not intended to diagnose MRSA infection nor to guide or monitor treatment for MRSA infections.     Coagulation Studies: No results for input(s): LABPROT, INR in the last 72 hours.  Urinalysis: No results for input(s): COLORURINE, LABSPEC, PHURINE, GLUCOSEU, HGBUR, BILIRUBINUR, KETONESUR, PROTEINUR, UROBILINOGEN, NITRITE, LEUKOCYTESUR in the last 72 hours.  Invalid input(s): APPERANCEUR    Imaging: No results found.   Medications:     . cefTRIAXone (ROCEPHIN)  IV  1 g Intravenous Q24H  . enoxaparin (LOVENOX) injection  40 mg Subcutaneous Q24H  . furosemide  40 mg Oral BID  . lactulose  10 g Oral Daily  . LORazepam  0.5 mg Oral BID  . nadolol  20 mg Oral Daily  . ofloxacin  1 drop Left Eye QID  . pantoprazole  40 mg Oral Daily  . sodium chloride  1 g Oral TID WC  . spironolactone  25 mg Oral Daily   bisacodyl, ondansetron, simethicone, traMADol, zolpidem  Assessment/ Plan:  Mr. Jeff BreslowDouglas W Vanmaanen  is a 70 y.o. 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 Acute on chronic hyponatremia: Likely secondary to cirrhosis Baseline Na 125-130 - sodium tablets - Fluid restriction 1200 cc /day - Continue furosemide and spironolactone  2. Acute Renal Failure on chronic kidney disease stage III: rise in creatinine to 1.73 today. Recent episode of hypotension.  - Continue to monitor.   3. Hyperkalemia and metabolic acidosis: was on oral potassium supplements at home.  Potassium now at goal - hold potassium  - monitor GI losses from ostomy.   LOS: 4 Haiden Rawlinson 8/28/201710:52 AM

## 2016-07-07 NOTE — Progress Notes (Signed)
Patient ID: Jeff Preston, male   DOB: 1946-04-09, 70 y.o.   MRN: [id number]  [employer] PROGRESS NOTE  ISSAI Preston NWG:[id number] DOB: Dec 06, 1945 DOA: 07/03/2016 PCP: Lynnae Prude, MD  HPI/Subjective: Sodium remained stable awaiting transfer to [facility]  Objective: Vitals:   07/07/16 0522 07/07/16 0955  BP: (!) 113/54 (!) 122/55  Pulse: 73 73  Resp:  16  Temp: 98 F (36.7 C)     Filed Weights   07/03/16 0743 07/03/16 1242  Weight: 87.5 kg (193 lb) 79.6 kg (175 lb 6.4 oz)    ROS: Review of Systems  Constitutional: Negative for chills and fever.  Eyes: Negative for blurred vision.  Respiratory: Negative for cough and shortness of breath.   Cardiovascular: Negative for chest pain.  Gastrointestinal: Negative for abdominal pain, constipation, diarrhea, nausea and vomiting.  Genitourinary: Negative for dysuria.  Musculoskeletal: Negative for joint pain.  Neurological: Positive for weakness. Negative for dizziness and headaches.  Psychiatric/Behavioral: Negative for memory loss.   Exam: Physical Exam  Constitutional: He is oriented to person, place, and time.  HENT:  Nose: No mucosal edema.  Mouth/Throat: No oropharyngeal exudate or posterior oropharyngeal edema.  Eyes: Conjunctivae, EOM and lids are normal. Pupils are equal, round, and reactive to light.  Neck: No JVD present. Carotid bruit is not present. No edema present. No thyroid mass and no thyromegaly present.  Cardiovascular: S1 normal and S2 normal.  Exam reveals no gallop.   Murmur heard.  Systolic murmur is present with a grade of 2/6  Pulses:      Dorsalis pedis pulses are 2+ on the right side, and 2+ on the left side.  Respiratory: No respiratory distress. He has no wheezes. He has no rhonchi. He has no rales.  GI: Soft. Bowel sounds are normal. There is no tenderness.    drainge bag at the umbilius  Musculoskeletal:       Right ankle: He exhibits no swelling.       Left ankle: He exhibits no  swelling.  Lymphadenopathy:    He has no cervical adenopathy.  Neurological: He is alert and oriented to person, place, and time. No cranial nerve deficit.  Skin: Skin is warm. No rash noted. Nails show no clubbing.  Psychiatric: He has a normal mood and affect.      Data Reviewed: Basic Metabolic Panel:  Recent Labs Lab 07/03/16 1317 07/03/16 1703  07/04/16 0049 07/04/16 5784  07/05/16 2052 07/06/16 0258 07/06/16 0903 07/06/16 2157 07/07/16 0919  NA 114* 116*  < > 116*  116* 118*  < > 123* 125* 125* 123* 124*  K 4.9 4.8  --  4.9 4.8  --   --  4.4  --   --   --   CL 86* 89*  --  89* 90*  --   --  95*  --   --   --   CO2 21* 19*  --  19* 21*  --   --  22  --   --   --   GLUCOSE 106* 103*  --  107* 108*  --   --  112*  --   --   --   BUN 23* 22*  --  20 21*  --   --  26*  --   --   --   CREATININE 1.32* 1.32*  --  1.14 1.20  --   --  1.73*  --   --   --   CALCIUM  7.9* 7.8*  --  7.4* 7.5*  --   --  7.8*  --   --   --   < > = values in this interval not displayed. Liver Function Tests:  Recent Labs Lab 07/03/16 0801  AST 69*  ALT 35  ALKPHOS 223*  BILITOT 0.6  PROT 6.4*  ALBUMIN 2.5*   CBC:  Recent Labs Lab 07/03/16 0801 07/04/16 1717  WBC 10.7* 8.1  NEUTROABS 8.1*  --   HGB 12.0* 11.4*  HCT 33.8* 33.1*  MCV 83.2 83.6  PLT 307 228   Cardiac Enzymes:  Recent Labs Lab 07/03/16 0801  TROPONINI <0.03     Recent Results (from the past 240 hour(s))  Culture, body fluid-bottle     Status: Abnormal (Preliminary result)   Collection Time: 07/03/16  8:26 AM  Result Value Ref Range Status   Specimen Description FLUID ABDOMEN  Final   Special Requests NONE  Final   Gram Stain   Final    GRAM POSITIVE COCCI IN PAIRS IN CHAINS GRAM NEGATIVE RODS IN BOTH AEROBIC AND ANAEROBIC BOTTLES Performed [facility]    Culture (A)  Final    PROTEUS MIRABILIS KLEBSIELLA PNEUMONIAE GRAM POSITIVE COCCI    Report Status PENDING  Incomplete   Organism ID,  Bacteria PROTEUS MIRABILIS  Final   Organism ID, Bacteria KLEBSIELLA PNEUMONIAE  Final      Susceptibility   Klebsiella pneumoniae - MIC*    AMPICILLIN >=32 RESISTANT Resistant     CEFAZOLIN <=4 SENSITIVE Sensitive     CEFEPIME <=1 SENSITIVE Sensitive     CEFTAZIDIME <=1 SENSITIVE Sensitive     CEFTRIAXONE <=1 SENSITIVE Sensitive     CIPROFLOXACIN <=0.25 SENSITIVE Sensitive     GENTAMICIN <=1 SENSITIVE Sensitive     IMIPENEM <=0.25 SENSITIVE Sensitive     TRIMETH/SULFA >=320 RESISTANT Resistant     AMPICILLIN/SULBACTAM >=32 RESISTANT Resistant     PIP/TAZO 16 SENSITIVE Sensitive     Extended ESBL NEGATIVE Sensitive     * KLEBSIELLA PNEUMONIAE   Proteus mirabilis - MIC*    AMPICILLIN <=2 SENSITIVE Sensitive     CEFAZOLIN <=4 SENSITIVE Sensitive     CEFEPIME <=1 SENSITIVE Sensitive     CEFTAZIDIME <=1 SENSITIVE Sensitive     CEFTRIAXONE <=1 SENSITIVE Sensitive     CIPROFLOXACIN <=0.25 SENSITIVE Sensitive     GENTAMICIN <=1 SENSITIVE Sensitive     IMIPENEM 4 SENSITIVE Sensitive     TRIMETH/SULFA <=20 SENSITIVE Sensitive     AMPICILLIN/SULBACTAM <=2 SENSITIVE Sensitive     PIP/TAZO <=4 SENSITIVE Sensitive     * PROTEUS MIRABILIS  Gram stain     Status: None   Collection Time: 07/03/16  8:26 AM  Result Value Ref Range Status   Specimen Description FLUID ABDOMEN  Final   Special Requests NONE  Final   Gram Stain   Final    MODERATE WBC PRESENT,BOTH PMN AND MONONUCLEAR ABUNDANT GRAM POSITIVE COCCI IN PAIRS IN CHAINS MODERATE GRAM VARIABLE ROD    Report Status 07/03/2016 FINAL  Final  MRSA PCR Screening     Status: None   Collection Time: 07/03/16  4:30 PM  Result Value Ref Range Status   MRSA by PCR NEGATIVE NEGATIVE Final    Comment:        The GeneXpert MRSA Assay (FDA approved for NASAL specimens only), is one component of a comprehensive MRSA colonization surveillance program. It is not intended to diagnose MRSA infection nor to guide  or monitor treatment for MRSA  infections.      Studies: No results found.  Scheduled Meds: . cefTRIAXone (ROCEPHIN)  IV  1 g Intravenous Q24H  . enoxaparin (LOVENOX) injection  40 mg Subcutaneous Q24H  . furosemide  40 mg Oral BID  . lactulose  10 g Oral Daily  . LORazepam  0.5 mg Oral BID  . nadolol  20 mg Oral Daily  . ofloxacin  1 drop Left Eye QID  . pantoprazole  40 mg Oral Daily  . sodium chloride  1 g Oral TID WC  . spironolactone  25 mg Oral Daily    Assessment/Plan:  1. Acute on chronic hyponatremia.Sodium continues to improve. Continue Therapy with sodium chloride tablets. Continue Lasix and spironolactone continued. Sodium back to his baseline  2. Bacterial peritonitis with Proteus, continue Rocephin. 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 Mechele Collin evaluating transferred to [facility]  3. Cirrhosis. Continue nadolol Lasix and spironolactone. Also continue lactulose. 4. Essential hypertension on atenolol 5. GERD on Protonix  Code Status:     Code Status Orders        Start     Ordered   07/03/16 1239  Do not attempt resuscitation (DNR)  Continuous    Question Answer Comment  In the event of cardiac or respiratory ARREST Do not call a "code blue"   In the event of cardiac or respiratory ARREST Do not perform Intubation, CPR, defibrillation or ACLS   In the event of cardiac or respiratory ARREST Use medication by any route, position, wound care, and other measures to relive pain and suffering. May use oxygen, suction and manual treatment of airway obstruction as needed for comfort.      07/03/16 1238    Code Status History    Date Active Date Inactive Code Status Order ID Comments User Context   04/26/2016  3:36 PM 05/01/2016  6:32 PM DNR [id number]  Milagros Loll, MD ED   03/14/2016 12:30 PM 03/17/2016 10:33 PM DNR [id number]  Suan Halter, MD Inpatient   03/12/2016  8:08 PM 03/14/2016 12:29 PM Full Code [id number]  Auburn Bilberry, MD ED     Family Communication:  Sr. at the bedside Disposition Plan: To be determined based on clinical course  Consultants:  Nephrology  Antibiotics:  Rocephin  Time spent:    Nury Nebergall, [facility]  [employer]

## 2016-07-07 NOTE — Care Management Important Message (Signed)
Important Message  Patient Details  Name: Maia BreslowDouglas W Santaella MRN: [id number] Date of Birth: 1946-06-29   Medicare Important Message Given:  Yes    Gwenette GreetBrenda S Daylen Lipsky, RN 07/07/2016, 10:46 AM

## 2016-07-07 NOTE — Consult Note (Signed)
Patient and I had a brief discussion and he has decided he did not want to go [facility] and [facility].  He wants to stay home and have his problem managed medically.  I called [facility] transfer unit and removed him from their list.  His ostomy bag is not filling up but his scrotum is starting to swell.

## 2016-07-07 NOTE — Consult Note (Signed)
Consultation Note Date: 07/07/2016   Patient Name: Jeff Preston  DOB: May 29, 1946  MRN: [id number]  Age / Sex: 70 y.o., male  PCP: Scot Jun, MD Referring Physician: Auburn Bilberry, MD  Reason for Consultation: Establishing goals of care and Psychosocial/spiritual support  HPI/Patient Profile: 70 y.o. male  admitted on 07/03/2016 with a  known history of Alcoholic cirrhosis, chronic ascites, umbilical hernia leaking  fluid for which he has refused in this surgery in the past who has a colostomy bag for  fluid collection comes to the emergency room with increasing weakness, poor appetite and was found to have sodium of 111. His potassium was 6.3.  Patient denies any alcohol or beer intake. He lives with his son.  His sister is his main support person.  He has had multiply admission in the last  six months.  Dr Markham Jordan is his GI specialist  He was admitted for acute on chronic hyponatremia, hyperkalemia   Patient faces advanced directive decisions, treatment option decisions and anticipatory care needs.  Clinical Assessment and Goals of Care:   This NP Lorinda Creed reviewed medical records, received report from team, assessed the patient and then meet at the patient's bedside along with to discuss diagnosis, prognosis, GOC,disposition and options.  A discussion was had today regarding advanced directives.  Concepts specific to code status, artifical feeding and hydration, continued IV antibiotics and rehospitalization was had.  The difference between a aggressive medical intervention path  and a palliative comfort care path for this patient at this time was had.  Values and goals of care important to patient and family were attempted to be elicited.  We discussed the limitations of medical interventions as chronic disease progresses and concept of mortality and importance of contemplating and  documenting GOC  MOST form introduced  Concept of Hospice and Palliative Care were discussed   Questions and concerns addressed, patient encouraged to call with questions or concerns.  PMT will continue to support holistically.    SUMMARY OF RECOMMENDATIONS    Code Status/Advance Care Planning:  DNR   Symptom Management:   Chronic ascites: discussed possibility of tunneled catheter to maximize comfort and quality of life, we discussed increase risk of infection with such an intervention.  Palliative Prophylaxis:   Bowel Regimen, Delirium Protocol, Frequent Pain Assessment, Oral Care and Palliative Wound Care  Psycho-social/Spiritual:   Desire for further Chaplaincy support:no- declines at this time  Additional Recommendations: Education on Hospice   Discharge Planning:    The patient verbalizes confusion about weather he is discharging home or being transferred to [facility] for evaluation of hernia repair. He is open to Hospice evaluation when he is dc home   To Be Determined      Primary Diagnoses: Present on Admission: . Hyponatremia   I have reviewed the medical record, interviewed the patient and family, and examined the patient. The following aspects are pertinent.  Past Medical History:  Diagnosis Date  . Anxiety   . Ascites   . Chronic hyponatremia   .  Cirrhosis (HCC)   . Depression   . DNR (do not resuscitate)   . GERD (gastroesophageal reflux disease)   . Hypertension   . Umbilical hernia    Social History   Social History  . Marital status: Single    Spouse name: N/A  . Number of children: N/A  . Years of education: N/A   Social History Main Topics  . Smoking status: Former Games developermoker  . Smokeless tobacco: Former NeurosurgeonUser    Quit date: 07/04/2016  . Alcohol use No  . Drug use: No  . Sexual activity: Not Asked   Other Topics Concern  . None   Social History Narrative  . None   Family History  Problem Relation Age of Onset  . CAD  Mother    Scheduled Meds: . cefTRIAXone (ROCEPHIN)  IV  1 g Intravenous Q24H  . enoxaparin (LOVENOX) injection  40 mg Subcutaneous Q24H  . furosemide  40 mg Oral BID  . lactulose  10 g Oral Daily  . LORazepam  0.5 mg Oral BID  . nadolol  20 mg Oral Daily  . ofloxacin  1 drop Left Eye QID  . pantoprazole  40 mg Oral Daily  . sodium chloride  1 g Oral TID WC  . spironolactone  25 mg Oral Daily   Continuous Infusions:  PRN Meds:.bisacodyl, ondansetron, simethicone, traMADol, zolpidem Medications Prior to Admission:  Prior to Admission medications   Medication Sig Start Date End Date Taking? Authorizing Provider  bisacodyl (DULCOLAX) 5 MG EC tablet Take 1 tablet (5 mg total) by mouth daily as needed for moderate constipation. 03/16/16  Yes Ramonita LabAruna Gouru, MD  furosemide (LASIX) 40 MG tablet Take 1 tablet (40 mg total) by mouth daily. 05/01/16  Yes Srikar Sudini, MD  lactulose (CHRONULAC) 10 GM/15ML solution Take 15 mLs (10 g total) by mouth daily. 05/01/16  Yes Srikar Sudini, MD  LORazepam (ATIVAN) 0.5 MG tablet Take 1 tablet (0.5 mg total) by mouth 2 (two) times daily. 05/01/16  Yes Srikar Sudini, MD  nadolol (CORGARD) 20 MG tablet Take 20 mg by mouth daily.   Yes Historical Provider, MD  omeprazole (PRILOSEC) 20 MG capsule Take 20 mg by mouth 2 (two) times daily.    Yes Historical Provider, MD  ondansetron (ZOFRAN-ODT) 4 MG disintegrating tablet Take 4 mg by mouth every 8 (eight) hours as needed for nausea or vomiting.   Yes Historical Provider, MD  potassium chloride SA (K-DUR,KLOR-CON) 20 MEQ tablet Take 20 mEq by mouth 2 (two) times daily. 05/28/16  Yes Historical Provider, MD  simethicone (MYLICON) 80 MG chewable tablet Chew 160-240 mg by mouth every 6 (six) hours as needed for flatulence.    Yes Historical Provider, MD  sodium chloride 1 g tablet Take 1 tablet (1 g total) by mouth daily. 05/01/16  Yes Srikar Sudini, MD  traMADol (ULTRAM) 50 MG tablet Take 1 tablet (50 mg total) by mouth every 6  (six) hours as needed for moderate pain or severe pain. 05/01/16  Yes Srikar Sudini, MD  zolpidem (AMBIEN) 10 MG tablet Take 1 tablet (10 mg total) by mouth at bedtime as needed for sleep. 05/01/16  Yes Srikar Sudini, MD  spironolactone (ALDACTONE) 50 MG tablet Take 50 mg by mouth daily. 04/14/16   Historical Provider, MD   Allergies  Allergen Reactions  . Dilantin [Phenytoin Sodium Extended] Rash  . Penicillins Rash and Other (See Comments)    Has patient had a PCN reaction causing immediate rash, facial/tongue/throat swelling, SOB  or lightheadedness with hypotension: No Has patient had a PCN reaction causing severe rash involving mucus membranes or skin necrosis: No Has patient had a PCN reaction that required hospitalization No Has patient had a PCN reaction occurring within the last 10 years: No If all of the above answers are "NO", then may proceed with Cephalosporin use.   Review of Systems  Constitutional: Positive for fatigue.  Gastrointestinal: Positive for abdominal distention.       - leaking umbilical hernia  Neurological: Positive for weakness.    Physical Exam  Constitutional: He appears cachectic. He appears ill.  Cardiovascular: Normal rate, regular rhythm and normal heart sounds.   Pulmonary/Chest: Effort normal and breath sounds normal.  Abdominal: He exhibits ascites. There is tenderness.  -noted umbilical hernia with drainage bag- small amount of serous fluid noted  Skin: Skin is warm and dry.    Vital Signs: BP (!) 122/55 (BP Location: Right Arm)   Pulse 73   Temp 98 F (36.7 C) (Oral)   Resp 16   Ht 5\' 7"  (1.702 m)   Wt 79.6 kg (175 lb 6.4 oz)   SpO2 100%   BMI 27.47 kg/m  Pain Assessment: No/denies pain POSS *See Group Information*: S-Acceptable,Sleep, easy to arouse Pain Score: Asleep   SpO2: SpO2: 100 % O2 Device:SpO2: 100 % O2 Flow Rate: .   IO: Intake/output summary:  Intake/Output Summary (Last 24 hours) at 07/07/16 1309 Last data filed at  07/07/16 0900  Gross per 24 hour  Intake              480 ml  Output                1 ml  Net              479 ml    LBM: Last BM Date: 07/06/16 Baseline Weight: Weight: 87.5 kg (193 lb) Most recent weight: Weight: 79.6 kg (175 lb 6.4 oz)      Palliative Assessment/Data: 50%   Flowsheet Rows   Flowsheet Row Most Recent Value  Intake Tab  Referral Department  Nephrology  Unit at Time of Referral  Oncology Unit  Palliative Care Primary Diagnosis  Other (Comment) [cirrhosis]  Date Notified  07/04/16  Palliative Care Type  Return patient Palliative Care  Reason for referral  Clarify Goals of Care  Date of Admission  07/03/16  # of days IP prior to Palliative referral  1  Clinical Assessment  Psychosocial & Spiritual Assessment  Palliative Care Outcomes      Discussed with Steward Drone CMRN   Time In: 1200 Time Out: 1315 Time Total: 75 min Greater than 50%  of this time was spent counseling and coordinating care related to the above assessment and plan.  Signed by: Lorinda Creed, NP   Please contact Palliative Medicine Team phone at [phone] for questions and concerns.  For individual provider: See Loretha Stapler

## 2016-07-08 LAB — BASIC METABOLIC PANEL
ANION GAP: 5 (ref 5–15)
BUN: 30 mg/dL — ABNORMAL HIGH (ref 6–20)
CO2: 25 mmol/L (ref 22–32)
Calcium: 8.1 mg/dL — ABNORMAL LOW (ref 8.9–10.3)
Chloride: 96 mmol/L — ABNORMAL LOW (ref 101–111)
Creatinine, Ser: 1.45 mg/dL — ABNORMAL HIGH (ref 0.61–1.24)
GFR calc non Af Amer: 48 mL/min — ABNORMAL LOW (ref >60)
GFR, EST AFRICAN AMERICAN: 55 mL/min — AB (ref >60)
Glucose, Bld: 102 mg/dL — ABNORMAL HIGH (ref 65–99)
Potassium: 4.6 mmol/L (ref 3.5–5.1)
Sodium: 126 mmol/L — ABNORMAL LOW (ref 135–145)

## 2016-07-08 LAB — CBC
HEMATOCRIT: 29.6 % — AB (ref 40.0–52.0)
Hemoglobin: 10.4 g/dL — ABNORMAL LOW (ref 13.0–18.0)
MCH: 29.6 pg (ref 26.0–34.0)
MCHC: 35.3 g/dL (ref 32.0–36.0)
MCV: 83.9 fL (ref 80.0–100.0)
Platelets: 170 10*3/uL (ref 150–440)
RBC: 3.53 MIL/uL — AB (ref 4.40–5.90)
RDW: 16.6 % — AB (ref 11.5–14.5)
WBC: 7.3 10*3/uL (ref 3.8–10.6)

## 2016-07-08 LAB — CULTURE, BODY FLUID W GRAM STAIN -BOTTLE

## 2016-07-08 NOTE — Progress Notes (Signed)
[facility]  ROUNDING NOTE   Subjective:   Na 125 this morning No acute complaints No edema  No SOB  Objective:  Vital signs in last 24 hours:  Temp:  [97.4 F (36.3 C)-98.4 F (36.9 C)] 98.4 F (36.9 C) (08/29 0314) Pulse Rate:  [66-84] 84 (08/29 0314) Resp:  [16-18] 18 (08/29 0314) BP: (96-118)/(42-64) 118/64 (08/29 0314) SpO2:  [98 %-100 %] 98 % (08/29 0314)  Weight change:  Filed Weights   07/03/16 0743 07/03/16 1242  Weight: 87.5 kg (193 lb) 79.6 kg (175 lb 6.4 oz)    Intake/Output: I/O last 3 completed shifts: In: 390 [P.O.:240; IV Piggyback:150] Out: -    Intake/Output this shift:  No intake/output data recorded.  Physical Exam: General: NAD sitting up in bed  Head: Normocephalic, atraumatic. Moist oral mucosal membranes  Eyes: Anicteric,   Neck: Supple, trachea midline  Lungs:  Clear to auscultation  Heart: Regular rate and rhythm  Abdomen:   +umbilical hernia with draining peritoneal fluid  Extremities: no peripheral edema.  Neurologic: alert  Skin: No lesions       Basic Metabolic Panel:  Recent Labs Lab 07/03/16 1317 07/03/16 1703  07/04/16 0049 07/04/16 0414  07/06/16 0258 07/06/16 1610 07/06/16 2157 07/07/16 0919 07/07/16 2108 07/08/16 0848  NA 114* 116*  < > 116*  116* 118*  < > 125* 125* 123* 124* 124* 125*  K 4.9 4.8  --  4.9 4.8  --  4.4  --   --   --   --   --   CL 86* 89*  --  89* 90*  --  95*  --   --   --   --   --   CO2 21* 19*  --  19* 21*  --  22  --   --   --   --   --   GLUCOSE 106* 103*  --  107* 108*  --  112*  --   --   --   --   --   BUN 23* 22*  --  20 21*  --  26*  --   --   --   --   --   CREATININE 1.32* 1.32*  --  1.14 1.20  --  1.73*  --   --   --   --   --   CALCIUM 7.9* 7.8*  --  7.4* 7.5*  --  7.8*  --   --   --   --   --   < > = values in this interval not displayed.  Liver Function Tests:  Recent Labs Lab 07/03/16 0801  AST 69*  ALT 35  ALKPHOS 223*  BILITOT 0.6  PROT 6.4*   ALBUMIN 2.5*   No results for input(s): LIPASE, AMYLASE in the last 168 hours. No results for input(s): AMMONIA in the last 168 hours.  CBC:  Recent Labs Lab 07/03/16 0801 07/04/16 1717 07/08/16 0344  WBC 10.7* 8.1 7.3  NEUTROABS 8.1*  --   --   HGB 12.0* 11.4* 10.4*  HCT 33.8* 33.1* 29.6*  MCV 83.2 83.6 83.9  PLT 307 228 170    Cardiac Enzymes:  Recent Labs Lab 07/03/16 0801  TROPONINI <0.03    BNP: Invalid input(s): POCBNP  CBG: No results for input(s): GLUCAP in the last 168 hours.  Microbiology: Results for orders placed or performed during the hospital encounter of 07/03/16  Culture, body fluid-bottle  Status: Abnormal (Preliminary result)   Collection Time: 07/03/16  8:26 AM  Result Value Ref Range Status   Specimen Description FLUID ABDOMEN  Final   Special Requests NONE  Final   Gram Stain   Final    GRAM POSITIVE COCCI IN PAIRS IN CHAINS GRAM NEGATIVE RODS IN BOTH AEROBIC AND ANAEROBIC BOTTLES Performed [facility]    Culture (A)  Final    PROTEUS MIRABILIS KLEBSIELLA PNEUMONIAE ENTEROCOCCUS FAECALIS ENTEROCOCCUS GALLINARUM    Report Status PENDING  Incomplete   Organism ID, Bacteria PROTEUS MIRABILIS  Final   Organism ID, Bacteria KLEBSIELLA PNEUMONIAE  Final      Susceptibility   Klebsiella pneumoniae - MIC*    AMPICILLIN >=32 RESISTANT Resistant     CEFAZOLIN <=4 SENSITIVE Sensitive     CEFEPIME <=1 SENSITIVE Sensitive     CEFTAZIDIME <=1 SENSITIVE Sensitive     CEFTRIAXONE <=1 SENSITIVE Sensitive     CIPROFLOXACIN <=0.25 SENSITIVE Sensitive     GENTAMICIN <=1 SENSITIVE Sensitive     IMIPENEM <=0.25 SENSITIVE Sensitive     TRIMETH/SULFA >=320 RESISTANT Resistant     AMPICILLIN/SULBACTAM >=32 RESISTANT Resistant     PIP/TAZO 16 SENSITIVE Sensitive     Extended ESBL NEGATIVE Sensitive     * KLEBSIELLA PNEUMONIAE   Proteus mirabilis - MIC*    AMPICILLIN <=2 SENSITIVE Sensitive     CEFAZOLIN <=4 SENSITIVE Sensitive      CEFEPIME <=1 SENSITIVE Sensitive     CEFTAZIDIME <=1 SENSITIVE Sensitive     CEFTRIAXONE <=1 SENSITIVE Sensitive     CIPROFLOXACIN <=0.25 SENSITIVE Sensitive     GENTAMICIN <=1 SENSITIVE Sensitive     IMIPENEM 4 SENSITIVE Sensitive     TRIMETH/SULFA <=20 SENSITIVE Sensitive     AMPICILLIN/SULBACTAM <=2 SENSITIVE Sensitive     PIP/TAZO <=4 SENSITIVE Sensitive     * PROTEUS MIRABILIS  Gram stain     Status: None   Collection Time: 07/03/16  8:26 AM  Result Value Ref Range Status   Specimen Description FLUID ABDOMEN  Final   Special Requests NONE  Final   Gram Stain   Final    MODERATE WBC PRESENT,BOTH PMN AND MONONUCLEAR ABUNDANT GRAM POSITIVE COCCI IN PAIRS IN CHAINS MODERATE GRAM VARIABLE ROD    Report Status 07/03/2016 FINAL  Final  MRSA PCR Screening     Status: None   Collection Time: 07/03/16  4:30 PM  Result Value Ref Range Status   MRSA by PCR NEGATIVE NEGATIVE Final    Comment:        The GeneXpert MRSA Assay (FDA approved for NASAL specimens only), is one component of a comprehensive MRSA colonization surveillance program. It is not intended to diagnose MRSA infection nor to guide or monitor treatment for MRSA infections.     Coagulation Studies: No results for input(s): LABPROT, INR in the last 72 hours.  Urinalysis: No results for input(s): COLORURINE, LABSPEC, PHURINE, GLUCOSEU, HGBUR, BILIRUBINUR, KETONESUR, PROTEINUR, UROBILINOGEN, NITRITE, LEUKOCYTESUR in the last 72 hours.  Invalid input(s): APPERANCEUR    Imaging: No results found.   Medications:     . cefTRIAXone (ROCEPHIN)  IV  1 g Intravenous Q24H  . enoxaparin (LOVENOX) injection  40 mg Subcutaneous Q24H  . furosemide  40 mg Oral BID  . lactulose  10 g Oral Daily  . LORazepam  0.5 mg Oral BID  . nadolol  20 mg Oral Daily  . ofloxacin  1 drop Left Eye QID  . pantoprazole  40 mg Oral Daily  . sodium chloride  1 g Oral TID WC  . spironolactone  25 mg Oral Daily   bisacodyl,  ondansetron, simethicone, traMADol, zolpidem  Assessment/ Plan:  [name] is a 70 y.o. 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 Acute on chronic hyponatremia: Likely secondary to cirrhosis Baseline Na 125-130 - Fluid restriction 1200 cc /day - Continue furosemide and spironolactone  2. Acute Renal Failure on chronic kidney disease stage III: rise in creatinine to 1.73 (8/27).  - Continue to monitor.  - BMP today  3. Hyperkalemia and metabolic acidosis: was on oral potassium supplements at home.  Potassium now at goal - hold potassium  - monitor GI losses from ostomy.   LOS: 5 Adelayde Minney 8/29/201710:23 AM

## 2016-07-08 NOTE — Discharge Instructions (Signed)
°  DIET:  Regular diet, fluid resitric 1200 cc  DISCHARGE CONDITION:  Stable  ACTIVITY:  Activity as tolerated  OXYGEN:  Home Oxygen: No.   Oxygen Delivery: room air  DISCHARGE LOCATION:  home    ADDITIONAL DISCHARGE INSTRUCTION:   If you experience worsening of your admission symptoms, develop shortness of breath, life threatening emergency, suicidal or homicidal thoughts you must seek medical attention immediately by calling 911 or calling your MD immediately  if symptoms less severe.  You Must read complete instructions/literature along with all the possible adverse reactions/side effects for all the Medicines you take and that have been prescribed to you. Take any new Medicines after you have completely understood and accpet all the possible adverse reactions/side effects.   Please note  You were cared for by a hospitalist during your hospital stay. If you have any questions about your discharge medications or the care you received while you were in the hospital after you are discharged, you can call the unit and asked to speak with the hospitalist on call if the hospitalist that took care of you is not available. Once you are discharged, your primary care physician will handle any further medical issues. Please note that NO REFILLS for any discharge medications will be authorized once you are discharged, as it is imperative that you return to your primary care physician (or establish a relationship with a primary care physician if you do not have one) for your aftercare needs so that they can reassess your need for medications and monitor your lab values.

## 2016-07-08 NOTE — Progress Notes (Signed)
Patient's culture finalized after discharge. Informed hospitalist and [facility].   Cindi CarbonMary M Irelyn Perfecto, PharmD, BCPS Clinical Pharmacist 07/08/16 3:12 PM

## 2016-07-08 NOTE — Care Management (Signed)
Discharge to home per Dr. Allena KatzPatel. Rolling walker will be arranged through [facility]. Mr. Jeff FlemingsCarden couldn't tell me the name of the agency that is following him in the home. Clinical Social Worker Jeff Preston is calling [facility] to find out which agency.  Sister will transport. Jeff GreetBrenda S Jeremih Dearmas RN MSN CCM Care Management [phone]

## 2016-07-08 NOTE — Progress Notes (Signed)
Discussed discharge instructions and medications with pt and his sister. IV removed. All questions addressed. Pt transported home via car by his sister.

## 2016-07-08 NOTE — Care Management (Signed)
Telephone call to [name], discharge planner , at [facility]. [name] discharged from [facility] 05/29/16. [facility].  [facility]. [name] RN MSN CCM Care Management [phone]

## 2016-07-08 NOTE — Discharge Summary (Signed)
Jeff Preston [place], 70 y.o., DOB 03/21/1946, MRN [id number]. Admission date: 07/03/2016 Discharge Date 07/08/2016 Primary MD Lynnae Prude, MD Admitting Physician Enedina Finner, MD  Admission Diagnosis  Hyperkalemia [E87.5] Hyponatremia [E87.1]  Discharge Diagnosis   Active Problems: Acute on chronic hyponatremia   Alcoholic cirrhosis   Ascites   Hyperkalemia and metabolic acidosis  Umbilical hernia with drainage  Anxiety disorder Depression GERD Essential hypertension            Hospital Course [name]  is a 70 y.o. male with a known history of Alcoholic cirrhosis, chronic ascites, umbilical hernia leaking ascetic fluid for which he has refused in this surgery in the past who has a colostomy bag for acetic fluid collection comes to the emergency room with increasing weakness, poor appetite and was found to have sodium of 111. Patient was seen by nephrology and was started on sodium tablets and fluid restriction. His sodium started improving. Close to his baseline. His weakness is improved. Patient also has a umbilicus hernia with drainage of his ascites fluid with their doctor Mechele Collin of GI saw him and discussed the case with [facility] for transfer of the patient. However patient subsequently changed his mind. Now he wants to go home with home health. Currently he is doing better and has no symptoms.             Consults  GI  Significant Tests:  See full reports for all details     US Abdomen Limited  Result Date: 06/25/2016 CLINICAL DATA:  History of cirrhosis and large volume recurrent ascites requiring repeated paracentesis procedures typically yielding 9 - 14 L of fluid. Patient states that he recently had fluid leakage from an umbilical hernia. EXAM: LIMITED ABDOMEN ULTRASOUND FOR ASCITES TECHNIQUE: Limited ultrasound survey for ascites was performed in all four abdominal quadrants. COMPARISON:  Most recent paracentesis on 06/11/2016 FINDINGS: Four quadrant survey of  the peritoneal cavity shows relatively small amount of ascites and not nearly the volume present prior to paracentesis procedures. Presumably, a fair amount of fluid has leaked via the umbilical hernia in the interval since the prior paracentesis. The patient's abdomen is soft and nontender. Paracentesis was deferred today due to diminished ascites. IMPRESSION: Relatively small amount of ascites in the peritoneal cavity. Paracentesis was deferred. Given history of recent fluid leak from un umbilical hernia, the patient has likely lost some peritoneal fluid and does not have the large volume he usually presents with prior to paracentesis. Electronically Signed   By: Irish Lack M.D.   On: 06/25/2016 15:16   US Paracentesis  Result Date: 06/11/2016 INDICATION: Cirrhosis, abdominal distention, recurrent ascites EXAM: ULTRASOUND GUIDED PARACENTESIS MEDICATIONS: 1% lidocaine locally COMPLICATIONS: None immediate. PROCEDURE: An ultrasound guided paracentesis was thoroughly discussed with the patient and questions answered. The benefits, risks, alternatives and complications were also discussed. The patient understands and wishes to proceed with the procedure. Written consent was obtained. Ultrasound was performed to localize and mark an adequate pocket of fluid in the right lower quadrant of the abdomen. The area was then prepped and draped in the normal sterile fashion. 1% Lidocaine was used for local anesthesia. Under ultrasound guidance a safety centesis needle catheter was introduced. Paracentesis was performed. The catheter was removed and a dressing applied. FINDINGS: A total of approximately 14 L of peritoneal fluid was removed. A fluid sample was not sent for laboratory analysis. IMPRESSION: Successful ultrasound guided paracentesis yielding 14 L of ascites. Electronically Signed   By: Judie Petit.  Shick M.D.  On: 06/11/2016 16:32   Dg Chest Portable 1 View  Result Date: 07/03/2016 CLINICAL DATA:  Weakness.  EXAM: PORTABLE CHEST 1 VIEW COMPARISON:  Radiograph of April 30, 2016. FINDINGS: The heart size and mediastinal contours are within normal limits. Both lungs are clear. No pneumothorax or pleural effusion is noted. The visualized skeletal structures are unremarkable. IMPRESSION: No acute cardiopulmonary abnormality seen. Electronically Signed   By: Lupita Raider, M.D.   On: 07/03/2016 08:14       Today   Subjective:   Jeff Preston  patient feeling better no nausea or vomiting  Objective:   Blood pressure 118/64, pulse 84, temperature 98.4 F (36.9 C), temperature source Oral, resp. rate 18, height 5\' 7"  (1.702 m), weight 79.6 kg (175 lb 6.4 oz), SpO2 98 %.  . No intake or output data in the 24 hours ending 07/08/16 1446  Exam VITAL SIGNS: Blood pressure 118/64, pulse 84, temperature 98.4 F (36.9 C), temperature source Oral, resp. rate 18, height 5\' 7"  (1.702 m), weight 79.6 kg (175 lb 6.4 oz), SpO2 98 %.  GENERAL:  70 y.o.-year-old patient lying in the bed with no acute distress.  EYES: Pupils equal, round, reactive to light and accommodation. No scleral icterus. Extraocular muscles intact.  HEENT: Head atraumatic, normocephalic. Oropharynx and nasopharynx clear.  NECK:  Supple, no jugular venous distention. No thyroid enlargement, no tenderness.  LUNGS: Normal breath sounds bilaterally, no wheezing, rales,rhonchi or crepitation. No use of accessory muscles of respiration.  CARDIOVASCULAR: S1, S2 normal. No murmurs, rubs, or gallops.  ABDOMEN: Positive distended. Bowel sounds present. No organomegaly or mass.  EXTREMITIES: No pedal edema, cyanosis, or clubbing.  NEUROLOGIC: Cranial nerves II through XII are intact. Muscle strength 5/5 in all extremities. Sensation intact. Gait not checked.  PSYCHIATRIC: The patient is alert and oriented x 3.  SKIN: No obvious rash, lesion, or ulcer.   Data Review     CBC w Diff:  Lab Results  Component Value Date   WBC 7.3 07/08/2016    HGB 10.4 (L) 07/08/2016   HGB 10.8 (L) 03/05/2015   HCT 29.6 (L) 07/08/2016   HCT 31.9 (L) 03/05/2015   PLT 170 07/08/2016   PLT 151 03/05/2015   LYMPHOPCT 11 07/03/2016   LYMPHOPCT 11.8 03/05/2015   MONOPCT 12 07/03/2016   MONOPCT 10.3 03/05/2015   EOSPCT 1 07/03/2016   EOSPCT 2.9 03/05/2015   BASOPCT 0 07/03/2016   BASOPCT 0.5 03/05/2015   CMP:  Lab Results  Component Value Date   NA 126 (L) 07/08/2016   NA 134 (L) 03/05/2015   K 4.6 07/08/2016   K 3.8 03/05/2015   CL 96 (L) 07/08/2016   CL 98 (L) 03/05/2015   CO2 25 07/08/2016   CO2 26 03/05/2015   BUN 30 (H) 07/08/2016   BUN 7 03/05/2015   CREATININE 1.45 (H) 07/08/2016   CREATININE 1.04 03/05/2015   PROT 6.4 (L) 07/03/2016   PROT 7.4 03/05/2015   ALBUMIN 2.5 (L) 07/03/2016   ALBUMIN 3.0 (L) 03/05/2015   BILITOT 0.6 07/03/2016   BILITOT 1.9 (H) 03/05/2015   ALKPHOS 223 (H) 07/03/2016   ALKPHOS 108 03/05/2015   AST 69 (H) 07/03/2016   AST 61 (H) 03/05/2015   ALT 35 07/03/2016   ALT 22 03/05/2015  .  Micro Results Recent Results (from the past 240 hour(s))  Culture, body fluid-bottle     Status: Abnormal   Collection Time: 07/03/16  8:26 AM  Result Value Ref Range Status  Specimen Description FLUID ABDOMEN  Final   Special Requests NONE  Final   Gram Stain   Final    GRAM POSITIVE COCCI IN PAIRS IN CHAINS GRAM NEGATIVE RODS IN BOTH AEROBIC AND ANAEROBIC BOTTLES    Culture (A)  Final    PROTEUS MIRABILIS KLEBSIELLA PNEUMONIAE ENTEROCOCCUS FAECALIS ENTEROCOCCUS GALLINARUM ENTEROCOCCUS GALLINARUM IS INTRINSICALLY RESISTANT TO VANCOMYCIN  Performed at [facility]    Report Status 07/08/2016 FINAL  Final   Organism ID, Bacteria PROTEUS MIRABILIS  Final   Organism ID, Bacteria KLEBSIELLA PNEUMONIAE  Final   Organism ID, Bacteria ENTEROCOCCUS FAECALIS  Final   Organism ID, Bacteria ENTEROCOCCUS GALLINARUM  Final      Susceptibility   Enterococcus faecalis - MIC*    AMPICILLIN <=2 SENSITIVE  Sensitive     VANCOMYCIN 2 SENSITIVE Sensitive     GENTAMICIN SYNERGY SENSITIVE Sensitive     * ENTEROCOCCUS FAECALIS   Enterococcus gallinarum - MIC*    AMPICILLIN <=2 SENSITIVE Sensitive     VANCOMYCIN 4 RESISTANT Resistant     GENTAMICIN SYNERGY SENSITIVE Sensitive     LINEZOLID 2 SENSITIVE Sensitive     * ENTEROCOCCUS GALLINARUM   Klebsiella pneumoniae - MIC*    AMPICILLIN >=32 RESISTANT Resistant     CEFAZOLIN <=4 SENSITIVE Sensitive     CEFEPIME <=1 SENSITIVE Sensitive     CEFTAZIDIME <=1 SENSITIVE Sensitive     CEFTRIAXONE <=1 SENSITIVE Sensitive     CIPROFLOXACIN <=0.25 SENSITIVE Sensitive     GENTAMICIN <=1 SENSITIVE Sensitive     IMIPENEM <=0.25 SENSITIVE Sensitive     TRIMETH/SULFA >=320 RESISTANT Resistant     AMPICILLIN/SULBACTAM >=32 RESISTANT Resistant     PIP/TAZO 16 SENSITIVE Sensitive     Extended ESBL NEGATIVE Sensitive     * KLEBSIELLA PNEUMONIAE   Proteus mirabilis - MIC*    AMPICILLIN <=2 SENSITIVE Sensitive     CEFAZOLIN <=4 SENSITIVE Sensitive     CEFEPIME <=1 SENSITIVE Sensitive     CEFTAZIDIME <=1 SENSITIVE Sensitive     CEFTRIAXONE <=1 SENSITIVE Sensitive     CIPROFLOXACIN <=0.25 SENSITIVE Sensitive     GENTAMICIN <=1 SENSITIVE Sensitive     IMIPENEM 4 SENSITIVE Sensitive     TRIMETH/SULFA <=20 SENSITIVE Sensitive     AMPICILLIN/SULBACTAM <=2 SENSITIVE Sensitive     PIP/TAZO <=4 SENSITIVE Sensitive     * PROTEUS MIRABILIS  Gram stain     Status: None   Collection Time: 07/03/16  8:26 AM  Result Value Ref Range Status   Specimen Description FLUID ABDOMEN  Final   Special Requests NONE  Final   Gram Stain   Final    MODERATE WBC PRESENT,BOTH PMN AND MONONUCLEAR ABUNDANT GRAM POSITIVE COCCI IN PAIRS IN CHAINS MODERATE GRAM VARIABLE ROD    Report Status 07/03/2016 FINAL  Final  MRSA PCR Screening     Status: None   Collection Time: 07/03/16  4:30 PM  Result Value Ref Range Status   MRSA by PCR NEGATIVE NEGATIVE Final    Comment:        The  GeneXpert MRSA Assay (FDA approved for NASAL specimens only), is one component of a comprehensive MRSA colonization surveillance program. It is not intended to diagnose MRSA infection nor to guide or monitor treatment for MRSA infections.         Code Status Orders        Start     Ordered   07/03/16 1239  Do not attempt resuscitation (DNR)  Continuous    Question Answer Comment  In the event of cardiac or respiratory ARREST Do not call a "code blue"   In the event of cardiac or respiratory ARREST Do not perform Intubation, CPR, defibrillation or ACLS   In the event of cardiac or respiratory ARREST Use medication by any route, position, wound care, and other measures to relive pain and suffering. May use oxygen, suction and manual treatment of airway obstruction as needed for comfort.      07/03/16 1238    Code Status History    Date Active Date Inactive Code Status Order ID Comments User Context   04/26/2016  3:36 PM 05/01/2016  6:32 PM DNR [id number]  Milagros LollSrikar Sudini, MD ED   03/14/2016 12:30 PM 03/17/2016 10:33 PM DNR [id number]  Suan HalterMargaret F Campbell, MD Inpatient   03/12/2016  8:08 PM 03/14/2016 12:29 PM Full Code [id number]  Auburn BilberryShreyang Dinero Chavira, MD ED          Follow-up Information    Lynnae PrudeELLIOTT, [name], MD Follow up in 7 day(s).   Specialty:  Gastroenterology Contact information: [phone] [facility] [place] [address] [phone]        Lamont DowdyKOLLURU, SARATH, MD Follow up in 2 week(s).   Specialty:  Internal Medicine Why:  hyponatremia Contact information: [address] [place] [address] [phone]           Discharge Medications     Medication List    TAKE these medications   bisacodyl 5 MG EC tablet Commonly known as:  DULCOLAX Take 1 tablet (5 mg total) by mouth daily as needed for moderate constipation.   furosemide 40 MG tablet Commonly known as:  LASIX Take 1 tablet (40 mg total) by mouth  daily.   lactulose 10 GM/15ML solution Commonly known as:  CHRONULAC Take 15 mLs (10 g total) by mouth daily.   LORazepam 0.5 MG tablet Commonly known as:  ATIVAN Take 1 tablet (0.5 mg total) by mouth 2 (two) times daily.   nadolol 20 MG tablet Commonly known as:  CORGARD Take 20 mg by mouth daily.   omeprazole 20 MG capsule Commonly known as:  PRILOSEC Take 20 mg by mouth 2 (two) times daily.   ondansetron 4 MG disintegrating tablet Commonly known as:  ZOFRAN-ODT Take 4 mg by mouth every 8 (eight) hours as needed for nausea or vomiting.   potassium chloride SA 20 MEQ tablet Commonly known as:  K-DUR,KLOR-CON Take 20 mEq by mouth 2 (two) times daily.   simethicone 80 MG chewable tablet Commonly known as:  MYLICON Chew 160-240 mg by mouth every 6 (six) hours as needed for flatulence.   sodium chloride 1 g tablet Take 1 tablet (1 g total) by mouth daily.   spironolactone 50 MG tablet Commonly known as:  ALDACTONE Take 50 mg by mouth daily.   traMADol 50 MG tablet Commonly known as:  ULTRAM Take 1 tablet (50 mg total) by mouth every 6 (six) hours as needed for moderate pain or severe pain.   zolpidem 10 MG tablet Commonly known as:  AMBIEN Take 1 tablet (10 mg total) by mouth at bedtime as needed for sleep.          Total Time in preparing paper work, data evaluation and todays exam - 35 minutes  Auburn BilberryPATEL, Saragrace Selke M.D on 07/08/2016 at 2:46 PM  [facility]   Office  [phone]

## 2016-07-09 ENCOUNTER — Ambulatory Visit
Admission: RE | Admit: 2016-07-09 | Discharge: 2016-07-09 | Disposition: A | Discharge status: Home / Self Care | Payer: Medicare Other | Source: Ambulatory Visit | Attending: Unknown Physician Specialty | Admitting: Unknown Physician Specialty

## 2016-07-16 ENCOUNTER — Ambulatory Visit: Payer: Medicare Other

## 2016-07-21 ENCOUNTER — Other Ambulatory Visit: Payer: Self-pay | Admitting: Unknown Physician Specialty

## 2016-07-21 DIAGNOSIS — K7031 Alcoholic cirrhosis of liver with ascites: Secondary | ICD-10-CM

## 2016-07-23 ENCOUNTER — Ambulatory Visit: Payer: Medicare Other

## 2016-07-28 ENCOUNTER — Other Ambulatory Visit: Payer: Self-pay | Admitting: Unknown Physician Specialty

## 2016-07-28 DIAGNOSIS — K7031 Alcoholic cirrhosis of liver with ascites: Secondary | ICD-10-CM

## 2016-07-28 NOTE — Discharge Instructions (Signed)
Paracentesis, Care After °Refer to this sheet in the next few weeks. These instructions provide you with information about caring for yourself after your procedure. Your health care provider may also give you more specific instructions. Your treatment has been planned according to current medical practices, but problems sometimes occur. Call your health care provider if you have any problems or questions after your procedure. °WHAT TO EXPECT AFTER THE PROCEDURE °After your procedure, it is common to have a small amount of clear fluid coming from the puncture site. °HOME CARE INSTRUCTIONS °· Return to your normal activities as told by your health care provider. Ask your health care provider what activities are safe for you. °· Take over-the-counter and prescription medicines only as told by your health care provider. °· Do not take baths, swim, or use a hot tub until your health care provider approves. °· Follow instructions from your health care provider about: °¨ How to take care of your puncture site. °¨ When and how you should change your bandage (dressing). °¨ When you should remove your dressing. °· Check your puncture area every day signs of infection. Watch for: °¨ Redness, swelling, or pain. °¨ Fluid, blood, or pus. °· Keep all follow-up visits as told by your health care provider. This is important. °SEEK MEDICAL CARE IF: °· You have redness, swelling, or pain at your puncture site. °· You start to have more clear fluid coming from your puncture site. °· You have blood or pus coming from your puncture site. °· You have chills. °· You have a fever. °SEEK IMMEDIATE MEDICAL CARE IF: °· You develop chest pain or shortness of breath. °· You develop increasing pain, discomfort, or swelling in your abdomen. °· You feel dizzy or light-headed or you pass out. °  °This information is not intended to replace advice given to you by your health care provider. Make sure you discuss any questions you have with your health  care provider. °  °Document Released: 03/13/2015 Document Reviewed: 03/13/2015 °Elsevier Interactive Patient Education ©2016 [employer] ° °

## 2016-07-29 ENCOUNTER — Ambulatory Visit
Admission: RE | Admit: 2016-07-29 | Discharge: 2016-07-29 | Disposition: A | Discharge status: Home / Self Care | Payer: Medicare Other | Source: Ambulatory Visit | Attending: Unknown Physician Specialty | Admitting: Unknown Physician Specialty

## 2016-07-29 DIAGNOSIS — K7031 Alcoholic cirrhosis of liver with ascites: Secondary | ICD-10-CM | POA: Diagnosis not present

## 2016-07-29 LAB — SODIUM: SODIUM: 136 mmol/L (ref 135–145)

## 2016-07-29 MED ORDER — ALBUMIN HUMAN 25 % IV SOLN
25.0000 g | Freq: Once | INTRAVENOUS | Status: AC
  Administered 2016-07-29: 25 g via INTRAVENOUS
  Filled 2016-07-29: qty 100

## 2016-07-29 NOTE — Procedures (Signed)
Under US guidance, paracentesis was performed. No immediate complication. 

## 2016-08-06 ENCOUNTER — Other Ambulatory Visit: Payer: Self-pay | Admitting: Unknown Physician Specialty

## 2016-08-06 ENCOUNTER — Ambulatory Visit: Admission: RE | Admit: 2016-08-06 | Payer: Medicare Other | Source: Ambulatory Visit

## 2016-08-06 DIAGNOSIS — K7031 Alcoholic cirrhosis of liver with ascites: Secondary | ICD-10-CM

## 2016-08-07 ENCOUNTER — Other Ambulatory Visit: Payer: Self-pay | Admitting: Unknown Physician Specialty

## 2016-08-07 DIAGNOSIS — K7031 Alcoholic cirrhosis of liver with ascites: Secondary | ICD-10-CM

## 2016-08-13 ENCOUNTER — Ambulatory Visit: Admission: RE | Admit: 2016-08-13 | Payer: Medicare Other | Source: Ambulatory Visit

## 2016-08-13 ENCOUNTER — Ambulatory Visit: Payer: Medicare Other

## 2016-08-14 NOTE — Discharge Instructions (Signed)
Paracentesis, Care After °Refer to this sheet in the next few weeks. These instructions provide you with information about caring for yourself after your procedure. Your health care provider may also give you more specific instructions. Your treatment has been planned according to current medical practices, but problems sometimes occur. Call your health care provider if you have any problems or questions after your procedure. °WHAT TO EXPECT AFTER THE PROCEDURE °After your procedure, it is common to have a small amount of clear fluid coming from the puncture site. °HOME CARE INSTRUCTIONS °· Return to your normal activities as told by your health care provider. Ask your health care provider what activities are safe for you. °· Take over-the-counter and prescription medicines only as told by your health care provider. °· Do not take baths, swim, or use a hot tub until your health care provider approves. °· Follow instructions from your health care provider about: °¨ How to take care of your puncture site. °¨ When and how you should change your bandage (dressing). °¨ When you should remove your dressing. °· Check your puncture area every day signs of infection. Watch for: °¨ Redness, swelling, or pain. °¨ Fluid, blood, or pus. °· Keep all follow-up visits as told by your health care provider. This is important. °SEEK MEDICAL CARE IF: °· You have redness, swelling, or pain at your puncture site. °· You start to have more clear fluid coming from your puncture site. °· You have blood or pus coming from your puncture site. °· You have chills. °· You have a fever. °SEEK IMMEDIATE MEDICAL CARE IF: °· You develop chest pain or shortness of breath. °· You develop increasing pain, discomfort, or swelling in your abdomen. °· You feel dizzy or light-headed or you pass out. °  °This information is not intended to replace advice given to you by your health care provider. Make sure you discuss any questions you have with your health  care provider. °  °Document Released: 03/13/2015 Document Reviewed: 03/13/2015 °Elsevier Interactive Patient Education ©2016 [employer] ° °

## 2016-08-15 ENCOUNTER — Ambulatory Visit
Admission: RE | Admit: 2016-08-15 | Discharge: 2016-08-15 | Disposition: A | Discharge status: Home / Self Care | Payer: Medicare Other | Source: Ambulatory Visit | Attending: Unknown Physician Specialty | Admitting: Unknown Physician Specialty

## 2016-08-15 DIAGNOSIS — K7031 Alcoholic cirrhosis of liver with ascites: Secondary | ICD-10-CM | POA: Diagnosis not present

## 2016-08-15 MED ORDER — ALBUMIN HUMAN 25 % IV SOLN
25.0000 g | Freq: Once | INTRAVENOUS | Status: AC
  Administered 2016-08-15: 25 g via INTRAVENOUS
  Filled 2016-08-15: qty 100

## 2016-08-15 NOTE — Procedures (Signed)
Reviewed Dr. Earnest ConroyElliott's note from 08/13/16. Discussed procedure and risks with patient. Informed consent obtained. Will perform US-guided paracentesis.

## 2016-08-15 NOTE — Procedures (Signed)
Under US guidance, paracentesis was performed. 

## 2016-08-20 ENCOUNTER — Ambulatory Visit: Payer: Medicare Other

## 2016-08-27 ENCOUNTER — Ambulatory Visit: Payer: Medicare Other

## 2016-08-27 ENCOUNTER — Ambulatory Visit
Admission: RE | Admit: 2016-08-27 | Discharge: 2016-08-27 | Disposition: A | Discharge status: Home / Self Care | Payer: Medicare Other | Source: Ambulatory Visit | Attending: Unknown Physician Specialty | Admitting: Unknown Physician Specialty

## 2016-08-27 DIAGNOSIS — K7031 Alcoholic cirrhosis of liver with ascites: Secondary | ICD-10-CM | POA: Diagnosis not present

## 2016-08-27 LAB — SODIUM: Sodium: 135 mmol/L (ref 135–145)

## 2016-08-27 MED ORDER — ALBUMIN HUMAN 25 % IV SOLN
25.0000 g | Freq: Once | INTRAVENOUS | Status: AC
  Administered 2016-08-27: 25 g via INTRAVENOUS
  Filled 2016-08-27: qty 100

## 2016-08-27 NOTE — Progress Notes (Signed)
Dr. Earnest ConroyElliott's office notified of sodium level.  Also, change in patient's mentation-slower to respond and some difficulty understanding.  Due to large volume of paracentesis fluid drained the past 2 times, checking with Dr. Mechele CollinElliott for possible increase in every week appt. Instead of bi-weekly.

## 2016-08-27 NOTE — Procedures (Signed)
Under US guidance, paracentesis was performed. No immediate complication. 

## 2016-08-28 ENCOUNTER — Other Ambulatory Visit: Payer: Self-pay | Admitting: Unknown Physician Specialty

## 2016-08-28 DIAGNOSIS — K7031 Alcoholic cirrhosis of liver with ascites: Secondary | ICD-10-CM

## 2016-09-02 ENCOUNTER — Emergency Department
Admission: EM | Admit: 2016-09-02 | Discharge: 2016-09-02 | Disposition: A | Discharge status: Home / Self Care | Payer: Medicare Other | Attending: Emergency Medicine | Admitting: Emergency Medicine

## 2016-09-02 DIAGNOSIS — R188 Other ascites: Secondary | ICD-10-CM

## 2016-09-02 DIAGNOSIS — R55 Syncope and collapse: Secondary | ICD-10-CM

## 2016-09-02 DIAGNOSIS — Z79899 Other long term (current) drug therapy: Secondary | ICD-10-CM | POA: Diagnosis not present

## 2016-09-02 DIAGNOSIS — K7031 Alcoholic cirrhosis of liver with ascites: Secondary | ICD-10-CM | POA: Insufficient documentation

## 2016-09-02 DIAGNOSIS — I10 Essential (primary) hypertension: Secondary | ICD-10-CM | POA: Diagnosis not present

## 2016-09-02 DIAGNOSIS — Z87891 Personal history of nicotine dependence: Secondary | ICD-10-CM | POA: Insufficient documentation

## 2016-09-02 LAB — CBC
HEMATOCRIT: 29.1 % — AB (ref 40.0–52.0)
Hemoglobin: 10.1 g/dL — ABNORMAL LOW (ref 13.0–18.0)
MCH: 27.9 pg (ref 26.0–34.0)
MCHC: 34.8 g/dL (ref 32.0–36.0)
MCV: 80 fL (ref 80.0–100.0)
PLATELETS: 167 10*3/uL (ref 150–440)
RBC: 3.63 MIL/uL — ABNORMAL LOW (ref 4.40–5.90)
RDW: 15.9 % — AB (ref 11.5–14.5)
WBC: 7 10*3/uL (ref 3.8–10.6)

## 2016-09-02 LAB — BASIC METABOLIC PANEL
Anion gap: 8 (ref 5–15)
BUN: 13 mg/dL (ref 6–20)
CO2: 22 mmol/L (ref 22–32)
Calcium: 7.8 mg/dL — ABNORMAL LOW (ref 8.9–10.3)
Chloride: 100 mmol/L — ABNORMAL LOW (ref 101–111)
Creatinine, Ser: 1.37 mg/dL — ABNORMAL HIGH (ref 0.61–1.24)
GFR calc Af Amer: 59 mL/min — ABNORMAL LOW (ref >60)
GFR, EST NON AFRICAN AMERICAN: 51 mL/min — AB (ref >60)
Glucose, Bld: 114 mg/dL — ABNORMAL HIGH (ref 65–99)
POTASSIUM: 3.8 mmol/L (ref 3.5–5.1)
SODIUM: 130 mmol/L — AB (ref 135–145)

## 2016-09-02 LAB — AMMONIA: AMMONIA: 34 umol/L (ref 9–35)

## 2016-09-02 LAB — HEPATIC FUNCTION PANEL
ALBUMIN: 2.7 g/dL — AB (ref 3.5–5.0)
ALK PHOS: 158 U/L — AB (ref 38–126)
ALT: 19 U/L (ref 17–63)
AST: 48 U/L — ABNORMAL HIGH (ref 15–41)
Bilirubin, Direct: 0.3 mg/dL (ref 0.1–0.5)
Indirect Bilirubin: 0.6 mg/dL (ref 0.3–0.9)
TOTAL PROTEIN: 6.6 g/dL (ref 6.5–8.1)
Total Bilirubin: 0.9 mg/dL (ref 0.3–1.2)

## 2016-09-02 LAB — TROPONIN I: TROPONIN I: 0.04 ng/mL — AB (ref <0.03)

## 2016-09-02 MED ORDER — LORAZEPAM 2 MG/ML IJ SOLN
1.0000 mg | Freq: Once | INTRAMUSCULAR | Status: AC
  Administered 2016-09-02: 1 mg via INTRAVENOUS
  Filled 2016-09-02: qty 1

## 2016-09-02 NOTE — ED Provider Notes (Signed)
[facility]-EMERGENCY DEPARTMENT Provider Note   CSN: [id number] Arrival date & time: 09/02/16  1206     History   Chief Complaint Chief Complaint  Patient presents with  . Near Syncope    HPI Jeff Preston is a 70 y.o. male hx of alcohol cirrhosis, depression, GERD, HTN, chronic hyponatremia here with dizziness, near syncope. Patient states that he feels lightheaded dizzy for the last 2 weeks or so. Also has chronic nausea but denies any vomiting. He has been seeing Dr. Mechele Collin from GI and has frequent Paracentesis. She'll he was every month but now it's every week. He states that he was on the couch and felt lightheaded dizzy and then leaned backward and also passed out. Denies any fevers or chills. Patient was noted to be confused in triage and states that he has not been very compliant with his lactulose. He may have missed a couple doses.       The history is provided by the patient.    Past Medical History:  Diagnosis Date  . Anxiety   . Ascites   . Chronic hyponatremia   . Cirrhosis (HCC)   . Depression   . DNR (do not resuscitate)   . GERD (gastroesophageal reflux disease)   . Hypertension   . Umbilical hernia     Patient Active Problem List   Diagnosis Date Noted  . Palliative care by specialist   . DNR (do not resuscitate)   . Hepatic cirrhosis (HCC) 04/28/2016  . Hyponatremia 04/26/2016  . Umbilical hernia with gangrene   . Open wound of umbilical region   . Ascites 03/12/2016    Past Surgical History:  Procedure Laterality Date  . CHOLECYSTECTOMY    . COLON SURGERY    . COLOSTOMY N/A   . THROAT SURGERY         Home Medications    Prior to Admission medications   Medication Sig Start Date End Date Taking? Authorizing Provider  bisacodyl (DULCOLAX) 5 MG EC tablet Take 1 tablet (5 mg total) by mouth daily as needed for moderate constipation. 03/16/16   Ramonita Lab, MD  furosemide (LASIX) 40 MG tablet Take 1 tablet (40 mg total) by mouth daily.  05/01/16   Srikar Sudini, MD  lactulose (CHRONULAC) 10 GM/15ML solution Take 15 mLs (10 g total) by mouth daily. 05/01/16   Srikar Sudini, MD  LORazepam (ATIVAN) 0.5 MG tablet Take 1 tablet (0.5 mg total) by mouth 2 (two) times daily. 05/01/16   Srikar Sudini, MD  nadolol (CORGARD) 20 MG tablet Take 20 mg by mouth daily.    Historical Provider, MD  omeprazole (PRILOSEC) 20 MG capsule Take 20 mg by mouth 2 (two) times daily.     Historical Provider, MD  ondansetron (ZOFRAN-ODT) 4 MG disintegrating tablet Take 4 mg by mouth every 8 (eight) hours as needed for nausea or vomiting.    Historical Provider, MD  potassium chloride SA (K-DUR,KLOR-CON) 20 MEQ tablet Take 20 mEq by mouth 2 (two) times daily. 05/28/16   Historical Provider, MD  simethicone (MYLICON) 80 MG chewable tablet Chew 160-240 mg by mouth every 6 (six) hours as needed for flatulence.     Historical Provider, MD  sodium chloride 1 g tablet Take 1 tablet (1 g total) by mouth daily. 05/01/16   Milagros Loll, MD  spironolactone (ALDACTONE) 50 MG tablet Take 50 mg by mouth daily. 04/14/16   Historical Provider, MD  traMADol (ULTRAM) 50 MG tablet Take 1 tablet (50 mg total) by  mouth every 6 (six) hours as needed for moderate pain or severe pain. 05/01/16   Srikar Sudini, MD  zolpidem (AMBIEN) 10 MG tablet Take 1 tablet (10 mg total) by mouth at bedtime as needed for sleep. 05/01/16   Milagros LollSrikar Sudini, MD    Family History Family History  Problem Relation Age of Onset  . CAD Mother     Social History Social History  Substance Use Topics  . Smoking status: Former Games developermoker  . Smokeless tobacco: Former NeurosurgeonUser    Quit date: 07/04/2016  . Alcohol use No     Allergies   Dilantin [phenytoin sodium extended] and Penicillins   Review of Systems Review of Systems  Cardiovascular: Positive for near-syncope.  Gastrointestinal: Positive for abdominal distention.  Neurological: Positive for dizziness and syncope.  All other systems reviewed and are  negative.    Physical Exam Updated Vital Signs BP 137/72   Pulse 87   Temp 98.7 F (37.1 C) (Oral)   Resp 11   Ht 5\' 7"  (1.702 m)   Wt 201 lb 7 oz (91.4 kg)   SpO2 96%   BMI 31.55 kg/m   Physical Exam  Constitutional:  Chronically ill   HENT:  Head: Normocephalic.  Eyes: EOM are normal. Pupils are equal, round, and reactive to light.  Neck: Normal range of motion. Neck supple.  Cardiovascular: Normal rate, regular rhythm and normal heart sounds.   Pulmonary/Chest: Effort normal and breath sounds normal. No respiratory distress. He has no wheezes. He has no rales.  Abdominal:  Distended, + fluid wave. Nontender. Colostomy with brown stool   Musculoskeletal: Normal range of motion.  Neurological: He is alert.  A & o x 2. Slightly confused. Nl strength throughout   Skin: Skin is warm.  Psychiatric: He has a normal mood and affect.  Nursing note and vitals reviewed.    ED Treatments / Results  Labs (all labs ordered are listed, but only abnormal results are displayed) Labs Reviewed  BASIC METABOLIC PANEL - Abnormal; Notable for the following:       Result Value   Sodium 130 (*)    Chloride 100 (*)    Glucose, Bld 114 (*)    Creatinine, Ser 1.37 (*)    Calcium 7.8 (*)    GFR calc non Af Amer 51 (*)    GFR calc Af Amer 59 (*)    All other components within normal limits  CBC - Abnormal; Notable for the following:    RBC 3.63 (*)    Hemoglobin 10.1 (*)    HCT 29.1 (*)    RDW 15.9 (*)    All other components within normal limits  TROPONIN I - Abnormal; Notable for the following:    Troponin I 0.04 (*)    All other components within normal limits  HEPATIC FUNCTION PANEL - Abnormal; Notable for the following:    Albumin 2.7 (*)    AST 48 (*)    Alkaline Phosphatase 158 (*)    All other components within normal limits  AMMONIA  TROPONIN I  URINALYSIS COMPLETEWITH MICROSCOPIC ([facility] ONLY)  CBG MONITORING, ED    EKG  EKG Interpretation None      ED ECG  REPORT I, Richardean Canalavid H Alonni Heimsoth, the attending physician, personally viewed and interpreted this ECG.   Date: 09/02/2016  EKG Time: 12:29   Rate: 88  Rhythm: normal EKG, normal sinus rhythm  Axis: normal  Intervals:none  ST&T Change: nonspecific    Radiology No results  found.  Procedures Procedures (including critical care time)  Medications Ordered in ED Medications  LORazepam (ATIVAN) injection 1 mg (1 mg Intravenous Given 09/02/16 1538)     Initial Impression / Assessment and Plan / ED Course  I have reviewed the triage vital signs and the nursing notes.  Pertinent labs & imaging results that were available during my care of the patient were reviewed by me and considered in my medical decision making (see chart for details).  Clinical Course    JAAN FISCHEL is a 70 y.o. male here with weakness, increased ascites. Consider hepatic encephalopathy causing near syncope. Also consider ACS. No signs of SBP. I called IR and he actually has paracentesis scheduled tomorrow. Will get labs, ammonia level, LFTs.   5:51 PM Not orthostatic. Felt better now. Ammonia level 34. Labs at baseline (Na 130 and Cr 1.4 baseline). Initial trop 0.04 but repeat nl. I reminded him to return tomorrow for scheduled paracentesis. Will not need admission currently. He is alert and oriented now.     Final Clinical Impressions(s) / ED Diagnoses   Final diagnoses:  None    New Prescriptions New Prescriptions   No medications on file     Charlynne Pander, MD 09/02/16 1753

## 2016-09-02 NOTE — ED Notes (Signed)
Critical tropinin 0.04 MD made aware.

## 2016-09-02 NOTE — Discharge Instructions (Signed)
Take your medicines as prescribed, especially your lactulose.   Return tomorrow to get your paracentesis as scheduled.   See your doctor   Return to ER if you pass out, chest pain, shortness of breath, abdominal pain, vomiting.

## 2016-09-02 NOTE — ED Triage Notes (Signed)
Pt arrived per EMS with near syncope episode while resting. Pt reports he has been weak x 2 weeks. He reports chronic nausea r/t his stomach issues. Reports an increased frequency in paracentesis from every 4 weeks to every 2 weeks. Pt arrived alert oriented and able to weakly undress self

## 2016-09-03 ENCOUNTER — Ambulatory Visit
Admission: RE | Admit: 2016-09-03 | Discharge: 2016-09-03 | Disposition: A | Discharge status: Home / Self Care | Payer: Medicare Other | Source: Ambulatory Visit | Attending: Unknown Physician Specialty | Admitting: Unknown Physician Specialty

## 2016-09-03 ENCOUNTER — Ambulatory Visit: Payer: Medicare Other

## 2016-09-03 DIAGNOSIS — K7031 Alcoholic cirrhosis of liver with ascites: Secondary | ICD-10-CM | POA: Diagnosis not present

## 2016-09-03 MED ORDER — ALBUMIN HUMAN 25 % IV SOLN
25.0000 g | Freq: Once | INTRAVENOUS | Status: AC
  Administered 2016-09-03: 25 g via INTRAVENOUS
  Filled 2016-09-03: qty 100

## 2016-09-03 NOTE — Procedures (Signed)
Advanced cirrhosis, large volume ascites, abdominal distention  Status post ultrasound large-volume paracentesis  No immediate Location  Full report in PACs

## 2016-09-06 ENCOUNTER — Encounter: Payer: Self-pay | Admitting: Emergency Medicine

## 2016-09-06 ENCOUNTER — Emergency Department
Admission: EM | Admit: 2016-09-06 | Discharge: 2016-09-06 | Disposition: A | Discharge status: Home / Self Care | Payer: Medicare Other | Attending: Emergency Medicine | Admitting: Emergency Medicine

## 2016-09-06 DIAGNOSIS — I10 Essential (primary) hypertension: Secondary | ICD-10-CM | POA: Insufficient documentation

## 2016-09-06 DIAGNOSIS — Z87891 Personal history of nicotine dependence: Secondary | ICD-10-CM | POA: Diagnosis not present

## 2016-09-06 DIAGNOSIS — R42 Dizziness and giddiness: Secondary | ICD-10-CM | POA: Diagnosis not present

## 2016-09-06 DIAGNOSIS — Z79899 Other long term (current) drug therapy: Secondary | ICD-10-CM | POA: Diagnosis not present

## 2016-09-06 DIAGNOSIS — R410 Disorientation, unspecified: Secondary | ICD-10-CM | POA: Insufficient documentation

## 2016-09-06 DIAGNOSIS — Z791 Long term (current) use of non-steroidal anti-inflammatories (NSAID): Secondary | ICD-10-CM | POA: Diagnosis not present

## 2016-09-06 DIAGNOSIS — R531 Weakness: Secondary | ICD-10-CM

## 2016-09-06 LAB — BASIC METABOLIC PANEL
Anion gap: 7 (ref 5–15)
BUN: 11 mg/dL (ref 6–20)
CO2: 26 mmol/L (ref 22–32)
Calcium: 8.3 mg/dL — ABNORMAL LOW (ref 8.9–10.3)
Chloride: 98 mmol/L — ABNORMAL LOW (ref 101–111)
Creatinine, Ser: 1.22 mg/dL (ref 0.61–1.24)
GFR calc Af Amer: >60 mL/min (ref >60)
GFR, EST NON AFRICAN AMERICAN: 59 mL/min — AB (ref >60)
Glucose, Bld: 97 mg/dL (ref 65–99)
POTASSIUM: 5 mmol/L (ref 3.5–5.1)
SODIUM: 131 mmol/L — AB (ref 135–145)

## 2016-09-06 LAB — CBC
HEMATOCRIT: 35.4 % — AB (ref 40.0–52.0)
Hemoglobin: 11.7 g/dL — ABNORMAL LOW (ref 13.0–18.0)
MCH: 26.8 pg (ref 26.0–34.0)
MCHC: 33.1 g/dL (ref 32.0–36.0)
MCV: 81 fL (ref 80.0–100.0)
PLATELETS: 268 10*3/uL (ref 150–440)
RBC: 4.38 MIL/uL — ABNORMAL LOW (ref 4.40–5.90)
RDW: 15.9 % — AB (ref 11.5–14.5)
WBC: 9.2 10*3/uL (ref 3.8–10.6)

## 2016-09-06 LAB — HEPATIC FUNCTION PANEL
ALBUMIN: 2.9 g/dL — AB (ref 3.5–5.0)
ALT: 21 U/L (ref 17–63)
AST: 73 U/L — AB (ref 15–41)
Alkaline Phosphatase: 153 U/L — ABNORMAL HIGH (ref 38–126)
BILIRUBIN TOTAL: 1.4 mg/dL — AB (ref 0.3–1.2)
Bilirubin, Direct: 0.5 mg/dL (ref 0.1–0.5)
Indirect Bilirubin: 0.9 mg/dL (ref 0.3–0.9)
Total Protein: 6.8 g/dL (ref 6.5–8.1)

## 2016-09-06 LAB — AMMONIA: Ammonia: 36 umol/L — ABNORMAL HIGH (ref 9–35)

## 2016-09-06 MED ORDER — LACTULOSE 10 GM/15ML PO SOLN: 10.0000 g | Freq: Every day | ORAL | 0 refills | Status: AC

## 2016-09-06 NOTE — ED Provider Notes (Signed)
[facility] Emergency Department Provider Note  ____________________________________________  Time seen: Approximately 4:47 PM  I have reviewed the triage vital signs and the nursing notes.   HISTORY  Chief Complaint Dizziness    HPI Jeff BreslowDouglas W Preston is a 70 y.o. male with liver failure who complains of dizziness and generalized weakness and confusion for the last couple days. He feels like he is forgotten to take his lactulose for the last couple of days. He is scheduled for a routine paracentesis later this week.Denies fever chills chest pain shortness of breath or diarrhea. No vomiting.     Past Medical History:  Diagnosis Date  . Anxiety   . Ascites   . Chronic hyponatremia   . Cirrhosis (HCC)   . Depression   . DNR (do not resuscitate)   . GERD (gastroesophageal reflux disease)   . Hypertension   . Umbilical hernia      Patient Active Problem List   Diagnosis Date Noted  . Palliative care by specialist   . DNR (do not resuscitate)   . Hepatic cirrhosis (HCC) 04/28/2016  . Hyponatremia 04/26/2016  . Umbilical hernia with gangrene   . Open wound of umbilical region   . Ascites 03/12/2016     Past Surgical History:  Procedure Laterality Date  . CHOLECYSTECTOMY    . COLON SURGERY    . COLOSTOMY N/A   . THROAT SURGERY       Prior to Admission medications   Medication Sig Start Date End Date Taking? Authorizing Provider  bisacodyl (DULCOLAX) 5 MG EC tablet Take 1 tablet (5 mg total) by mouth daily as needed for moderate constipation. 03/16/16  Yes Ramonita LabAruna Gouru, MD  furosemide (LASIX) 40 MG tablet Take 1 tablet (40 mg total) by mouth daily. 05/01/16  Yes Srikar Sudini, MD  nadolol (CORGARD) 20 MG tablet Take 20 mg by mouth daily.   Yes Historical Provider, MD  omeprazole (PRILOSEC) 20 MG capsule Take 20 mg by mouth 2 (two) times daily.    Yes Historical Provider, MD  potassium chloride SA (K-DUR,KLOR-CON) 20 MEQ tablet Take 20 mEq by  mouth 2 (two) times daily. 05/28/16  Yes Historical Provider, MD  simethicone (MYLICON) 80 MG chewable tablet Chew 160-240 mg by mouth every 6 (six) hours as needed for flatulence.    Yes Historical Provider, MD  sodium chloride 1 g tablet Take 1 tablet (1 g total) by mouth daily. 05/01/16  Yes Srikar Sudini, MD  spironolactone (ALDACTONE) 50 MG tablet Take 50 mg by mouth daily. 04/14/16  Yes Historical Provider, MD  traMADol (ULTRAM) 50 MG tablet Take 1 tablet (50 mg total) by mouth every 6 (six) hours as needed for moderate pain or severe pain. 05/01/16  Yes Srikar Sudini, MD  lactulose (CHRONULAC) 10 GM/15ML solution Take 15 mLs (10 g total) by mouth daily. 09/06/16   Sharman CheekPhillip Kyian Obst, MD     Allergies Dilantin [phenytoin sodium extended] and Penicillins   Family History  Problem Relation Age of Onset  . CAD Mother     Social History Social History  Substance Use Topics  . Smoking status: Former Games developermoker  . Smokeless tobacco: Former NeurosurgeonUser    Quit date: 07/04/2016  . Alcohol use No    Review of Systems  Constitutional:   No fever or chills.   Cardiovascular:   No chest pain. Respiratory:   No dyspnea or cough. Gastrointestinal:   Negative for abdominal pain, vomiting and diarrhea.   Musculoskeletal:   Negative  for focal pain or swelling Neurological:   Positive dizziness generalized weakness and confusion  10-point ROS otherwise negative.  ____________________________________________   PHYSICAL EXAM:  VITAL SIGNS: ED Triage Vitals  Enc Vitals Group     BP 09/06/16 1201 (!) 148/76     Pulse Rate 09/06/16 1201 60     Resp 09/06/16 1201 18     Temp 09/06/16 1201 98.8 F (37.1 C)     Temp Source 09/06/16 1201 Oral     SpO2 09/06/16 1201 100 %     Weight 09/06/16 1202 200 lb (90.7 kg)     Height 09/06/16 1202 5\' 8"  (1.727 m)     Head Circumference --      Peak Flow --      Pain Score --      Pain Loc --      Pain Edu? --      Excl. in GC? --     Vital signs  reviewed, nursing assessments reviewed.   Constitutional:   Alert and oriented. Well appearing and in no distress.Decreased energy Eyes:   No scleral icterus. No conjunctival pallor. PERRL. EOMI.  No nystagmus. ENT   Head:   Normocephalic and atraumatic.   Nose:   No congestion/rhinnorhea. No septal hematoma   Mouth/Throat:   MMM, no pharyngeal erythema. No peritonsillar mass.    Neck:   No stridor. No SubQ emphysema. No meningismus. Hematological/Lymphatic/Immunilogical:   No cervical lymphadenopathy. Cardiovascular:   RRR. Symmetric bilateral radial and DP pulses.  No murmurs.  Respiratory:   Normal respiratory effort without tachypnea nor retractions. Breath sounds are clear and equal bilaterally. No wheezes/rales/rhonchi. Gastrointestinal:   Soft and nontender. Non distended. There is no CVA tenderness.  No rebound, rigidity, or guarding. Colostomy site clean and intact. No significant prolapse. No bloody output Genitourinary:   deferred Musculoskeletal:   Nontender with normal range of motion in all extremities. No joint effusions.  No lower extremity tenderness.  No edema. Neurologic:   Lillie slurred speech. Normal language..  CN 2-10 normal. Motor grossly intact. No gross focal neurologic deficits are appreciated.  Skin:    Skin is warm, dry and intact. No rash noted.  No petechiae, purpura, or bullae.  ____________________________________________    LABS (pertinent positives/negatives) (all labs ordered are listed, but only abnormal results are displayed) Labs Reviewed  BASIC METABOLIC PANEL - Abnormal; Notable for the following:       Result Value   Sodium 131 (*)    Chloride 98 (*)    Calcium 8.3 (*)    GFR calc non Af Amer 59 (*)    All other components within normal limits  CBC - Abnormal; Notable for the following:    RBC 4.38 (*)    Hemoglobin 11.7 (*)    HCT 35.4 (*)    RDW 15.9 (*)    All other components within normal limits  HEPATIC FUNCTION  PANEL - Abnormal; Notable for the following:    Albumin 2.9 (*)    AST 73 (*)    Alkaline Phosphatase 153 (*)    Total Bilirubin 1.4 (*)    All other components within normal limits  AMMONIA - Abnormal; Notable for the following:    Ammonia 36 (*)    All other components within normal limits   ____________________________________________   EKG  Interpreted by me Sinus bradycardia rate of 57, normal axis intervals QRS ST segments and T waves  ____________________________________________    RADIOLOGY  ____________________________________________   PROCEDURES Procedures  ____________________________________________   INITIAL IMPRESSION / ASSESSMENT AND PLAN / ED COURSE  Pertinent labs & imaging results that were available during my care of the patient were reviewed by me and considered in my medical decision making (see chart for details).  Patient not in distress but complains of generalized weakness in the setting of liver failure and possible lactulose noncompliance. Check labs including ammonia.     Clinical Course    ----------------------------------------- 6:11 PM on 09/06/2016 -----------------------------------------  Labs unremarkable. Ammonia within normal limits. Vital signs unremarkable. Mental status is appropriate. No focal findings. Advised patient to discontinue Ativan and Ambien as these seem likely to cause adverse reactions and the patient his age with liver failure. I encouraged him to eat and drink regularly and continue taking his lactulose and follow up with his primary care and gastroenterology this week. ____________________________________________   FINAL CLINICAL IMPRESSION(S) / ED DIAGNOSES  Final diagnoses:  Generalized weakness       Portions of this note were generated with dragon dictation software. Dictation errors may occur despite best attempts at proofreading.    Sharman CheekPhillip Delara Shepheard, MD 09/06/16 [phone]

## 2016-09-06 NOTE — ED Triage Notes (Signed)
Per [facility], patient comes from home. Lives alone. Patient c/o dizziness and syncope, however patient is able to remember complete syncopal event. Patient has called EMS 3x this week for same compliant. Patient slow to answer questions. Patient states he is here for anxiety attacks, hx of same.

## 2016-09-10 ENCOUNTER — Ambulatory Visit
Admission: RE | Admit: 2016-09-10 | Discharge: 2016-09-10 | Disposition: A | Discharge status: Home / Self Care | Payer: Medicare Other | Source: Ambulatory Visit | Attending: Unknown Physician Specialty | Admitting: Unknown Physician Specialty

## 2016-09-10 ENCOUNTER — Ambulatory Visit: Payer: Medicare Other

## 2016-09-10 DIAGNOSIS — K7031 Alcoholic cirrhosis of liver with ascites: Secondary | ICD-10-CM

## 2016-09-10 DIAGNOSIS — R188 Other ascites: Secondary | ICD-10-CM | POA: Diagnosis not present

## 2016-09-10 MED ORDER — ALBUMIN HUMAN 25 % IV SOLN
25.0000 g | Freq: Once | INTRAVENOUS | Status: AC
  Administered 2016-09-10: 25 g via INTRAVENOUS
  Filled 2016-09-10: qty 100

## 2016-09-10 NOTE — Procedures (Signed)
US paracentesis  Complications:  None  Blood Loss: none  See dictation in canopy pacs  

## 2016-09-17 ENCOUNTER — Ambulatory Visit: Payer: Medicare Other

## 2016-09-17 ENCOUNTER — Ambulatory Visit
Admission: RE | Admit: 2016-09-17 | Discharge: 2016-09-17 | Disposition: A | Discharge status: Home / Self Care | Payer: Medicare Other | Source: Ambulatory Visit | Attending: Unknown Physician Specialty | Admitting: Unknown Physician Specialty

## 2016-09-17 DIAGNOSIS — K7031 Alcoholic cirrhosis of liver with ascites: Secondary | ICD-10-CM | POA: Diagnosis present

## 2016-09-17 DIAGNOSIS — R188 Other ascites: Secondary | ICD-10-CM | POA: Insufficient documentation

## 2016-09-17 MED ORDER — ALBUMIN HUMAN 25 % IV SOLN
25.0000 g | Freq: Once | INTRAVENOUS | Status: AC
  Administered 2016-09-17: 25 g via INTRAVENOUS
  Filled 2016-09-17: qty 100

## 2016-09-17 NOTE — Procedures (Signed)
US paracentesis  Complications:  None  Blood Loss: none  See dictation in canopy pacs  

## 2016-09-22 ENCOUNTER — Emergency Department: Payer: Medicare Other

## 2016-09-22 ENCOUNTER — Encounter: Payer: Self-pay | Admitting: Emergency Medicine

## 2016-09-22 ENCOUNTER — Inpatient Hospital Stay
Admission: EM | Admit: 2016-09-22 | Discharge: 2016-09-24 | DRG: 896 | Disposition: A | Discharge status: Home / Self Care | Payer: Medicare Other | Attending: Internal Medicine | Admitting: Internal Medicine

## 2016-09-22 DIAGNOSIS — Z87891 Personal history of nicotine dependence: Secondary | ICD-10-CM

## 2016-09-22 DIAGNOSIS — F329 Major depressive disorder, single episode, unspecified: Secondary | ICD-10-CM | POA: Diagnosis present

## 2016-09-22 DIAGNOSIS — E876 Hypokalemia: Secondary | ICD-10-CM | POA: Diagnosis present

## 2016-09-22 DIAGNOSIS — F1010 Alcohol abuse, uncomplicated: Secondary | ICD-10-CM | POA: Diagnosis present

## 2016-09-22 DIAGNOSIS — K219 Gastro-esophageal reflux disease without esophagitis: Secondary | ICD-10-CM | POA: Diagnosis present

## 2016-09-22 DIAGNOSIS — F13239 Sedative, hypnotic or anxiolytic dependence with withdrawal, unspecified: Secondary | ICD-10-CM | POA: Diagnosis not present

## 2016-09-22 DIAGNOSIS — Z79899 Other long term (current) drug therapy: Secondary | ICD-10-CM | POA: Diagnosis not present

## 2016-09-22 DIAGNOSIS — F411 Generalized anxiety disorder: Secondary | ICD-10-CM | POA: Diagnosis present

## 2016-09-22 DIAGNOSIS — R188 Other ascites: Secondary | ICD-10-CM

## 2016-09-22 DIAGNOSIS — Z8249 Family history of ischemic heart disease and other diseases of the circulatory system: Secondary | ICD-10-CM

## 2016-09-22 DIAGNOSIS — E43 Unspecified severe protein-calorie malnutrition: Secondary | ICD-10-CM | POA: Diagnosis present

## 2016-09-22 DIAGNOSIS — I10 Essential (primary) hypertension: Secondary | ICD-10-CM | POA: Diagnosis present

## 2016-09-22 DIAGNOSIS — E871 Hypo-osmolality and hyponatremia: Secondary | ICD-10-CM | POA: Diagnosis present

## 2016-09-22 DIAGNOSIS — F13939 Sedative, hypnotic or anxiolytic use, unspecified with withdrawal, unspecified: Secondary | ICD-10-CM | POA: Diagnosis present

## 2016-09-22 DIAGNOSIS — Z6835 Body mass index (BMI) 35.0-35.9, adult: Secondary | ICD-10-CM

## 2016-09-22 DIAGNOSIS — K7031 Alcoholic cirrhosis of liver with ascites: Secondary | ICD-10-CM | POA: Diagnosis present

## 2016-09-22 DIAGNOSIS — Z888 Allergy status to other drugs, medicaments and biological substances status: Secondary | ICD-10-CM

## 2016-09-22 DIAGNOSIS — Y900 Blood alcohol level of less than 20 mg/100 ml: Secondary | ICD-10-CM | POA: Diagnosis present

## 2016-09-22 DIAGNOSIS — R441 Visual hallucinations: Secondary | ICD-10-CM

## 2016-09-22 DIAGNOSIS — R45851 Suicidal ideations: Secondary | ICD-10-CM | POA: Diagnosis present

## 2016-09-22 DIAGNOSIS — F13231 Sedative, hypnotic or anxiolytic dependence with withdrawal delirium: Secondary | ICD-10-CM | POA: Diagnosis not present

## 2016-09-22 DIAGNOSIS — F32A Depression, unspecified: Secondary | ICD-10-CM

## 2016-09-22 DIAGNOSIS — K429 Umbilical hernia without obstruction or gangrene: Secondary | ICD-10-CM | POA: Diagnosis present

## 2016-09-22 DIAGNOSIS — Z880 Allergy status to penicillin: Secondary | ICD-10-CM

## 2016-09-22 DIAGNOSIS — Z66 Do not resuscitate: Secondary | ICD-10-CM | POA: Diagnosis present

## 2016-09-22 DIAGNOSIS — F13931 Sedative, hypnotic or anxiolytic use, unspecified with withdrawal delirium: Secondary | ICD-10-CM

## 2016-09-22 LAB — PHOSPHORUS: Phosphorus: 3.3 mg/dL (ref 2.5–4.6)

## 2016-09-22 LAB — CBC
HEMATOCRIT: 32.7 % — AB (ref 40.0–52.0)
HEMOGLOBIN: 10.9 g/dL — AB (ref 13.0–18.0)
MCH: 26.1 pg (ref 26.0–34.0)
MCHC: 33.5 g/dL (ref 32.0–36.0)
MCV: 78 fL — ABNORMAL LOW (ref 80.0–100.0)
Platelets: 197 10*3/uL (ref 150–440)
RBC: 4.19 MIL/uL — AB (ref 4.40–5.90)
RDW: 17 % — ABNORMAL HIGH (ref 11.5–14.5)
WBC: 10 10*3/uL (ref 3.8–10.6)

## 2016-09-22 LAB — COMPREHENSIVE METABOLIC PANEL
ALBUMIN: 3 g/dL — AB (ref 3.5–5.0)
ALT: 18 U/L (ref 17–63)
ANION GAP: 10 (ref 5–15)
AST: 47 U/L — ABNORMAL HIGH (ref 15–41)
Alkaline Phosphatase: 130 U/L — ABNORMAL HIGH (ref 38–126)
BUN: 11 mg/dL (ref 6–20)
CHLORIDE: 95 mmol/L — AB (ref 101–111)
CO2: 24 mmol/L (ref 22–32)
Calcium: 7.8 mg/dL — ABNORMAL LOW (ref 8.9–10.3)
Creatinine, Ser: 1.42 mg/dL — ABNORMAL HIGH (ref 0.61–1.24)
GFR calc non Af Amer: 49 mL/min — ABNORMAL LOW (ref >60)
GFR, EST AFRICAN AMERICAN: 57 mL/min — AB (ref >60)
GLUCOSE: 93 mg/dL (ref 65–99)
POTASSIUM: 2.8 mmol/L — AB (ref 3.5–5.1)
SODIUM: 129 mmol/L — AB (ref 135–145)
Total Bilirubin: 1.8 mg/dL — ABNORMAL HIGH (ref 0.3–1.2)
Total Protein: 6.7 g/dL (ref 6.5–8.1)

## 2016-09-22 LAB — URINE DRUG SCREEN, QUALITATIVE (ARMC ONLY)
AMPHETAMINES, UR SCREEN: NOT DETECTED
BARBITURATES, UR SCREEN: NOT DETECTED
Benzodiazepine, Ur Scrn: POSITIVE — AB
COCAINE METABOLITE, UR ~~LOC~~: NOT DETECTED
Cannabinoid 50 Ng, Ur ~~LOC~~: NOT DETECTED
MDMA (Ecstasy)Ur Screen: NOT DETECTED
METHADONE SCREEN, URINE: NOT DETECTED
Opiate, Ur Screen: NOT DETECTED
Phencyclidine (PCP) Ur S: NOT DETECTED
TRICYCLIC, UR SCREEN: NOT DETECTED

## 2016-09-22 LAB — MAGNESIUM: Magnesium: 1.6 mg/dL — ABNORMAL LOW (ref 1.7–2.4)

## 2016-09-22 LAB — AMMONIA: Ammonia: 26 umol/L (ref 9–35)

## 2016-09-22 LAB — ETHANOL: Alcohol, Ethyl (B): <5 mg/dL (ref <5)

## 2016-09-22 LAB — SALICYLATE LEVEL

## 2016-09-22 LAB — ACETAMINOPHEN LEVEL

## 2016-09-22 MED ORDER — POTASSIUM CHLORIDE CRYS ER 20 MEQ PO TBCR
40.0000 meq | EXTENDED_RELEASE_TABLET | Freq: Two times a day (BID) | ORAL | Status: AC
  Administered 2016-09-23 (×2): 40 meq via ORAL
  Filled 2016-09-22 (×3): qty 2

## 2016-09-22 MED ORDER — ONDANSETRON HCL 4 MG/2ML IJ SOLN: 4.0000 mg | Freq: Four times a day (QID) | INTRAMUSCULAR | Status: DC | PRN

## 2016-09-22 MED ORDER — BISACODYL 5 MG PO TBEC
5.0000 mg | DELAYED_RELEASE_TABLET | Freq: Every day | ORAL | Status: DC | PRN
  Administered 2016-09-24: 5 mg via ORAL
  Filled 2016-09-22: qty 1

## 2016-09-22 MED ORDER — HEPARIN SODIUM (PORCINE) 5000 UNIT/ML IJ SOLN
5000.0000 [IU] | Freq: Three times a day (TID) | INTRAMUSCULAR | Status: DC
  Administered 2016-09-22 – 2016-09-23 (×2): 5000 [IU] via SUBCUTANEOUS
  Filled 2016-09-22 (×2): qty 1

## 2016-09-22 MED ORDER — GABAPENTIN 100 MG PO CAPS
100.0000 mg | ORAL_CAPSULE | Freq: Two times a day (BID) | ORAL | Status: DC
  Administered 2016-09-23 – 2016-09-24 (×4): 100 mg via ORAL
  Filled 2016-09-22 (×4): qty 1

## 2016-09-22 MED ORDER — LACTULOSE 10 GM/15ML PO SOLN
10.0000 g | Freq: Every day | ORAL | Status: DC
  Administered 2016-09-23 – 2016-09-24 (×2): 10 g via ORAL
  Filled 2016-09-22 (×2): qty 30

## 2016-09-22 MED ORDER — NADOLOL 20 MG PO TABS
20.0000 mg | ORAL_TABLET | Freq: Every day | ORAL | Status: DC
  Administered 2016-09-23 – 2016-09-24 (×2): 20 mg via ORAL
  Filled 2016-09-22 (×2): qty 1

## 2016-09-22 MED ORDER — MAGNESIUM CITRATE PO SOLN
1.0000 | Freq: Once | ORAL | Status: DC | PRN
  Filled 2016-09-22: qty 296

## 2016-09-22 MED ORDER — SODIUM CHLORIDE 1 G PO TABS
1.0000 g | ORAL_TABLET | Freq: Every day | ORAL | Status: DC
  Administered 2016-09-23 – 2016-09-24 (×2): 1 g via ORAL
  Filled 2016-09-22 (×2): qty 1

## 2016-09-22 MED ORDER — PANTOPRAZOLE SODIUM 40 MG PO TBEC
40.0000 mg | DELAYED_RELEASE_TABLET | Freq: Every day | ORAL | Status: DC
  Administered 2016-09-23: 40 mg via ORAL
  Filled 2016-09-22: qty 1

## 2016-09-22 MED ORDER — SODIUM CHLORIDE 0.9 % IV SOLN
INTRAVENOUS | Status: DC
  Administered 2016-09-22: 23:00:00 via INTRAVENOUS

## 2016-09-22 MED ORDER — GABAPENTIN 100 MG PO CAPS
100.0000 mg | ORAL_CAPSULE | Freq: Every day | ORAL | Status: DC
  Administered 2016-09-22 – 2016-09-23 (×2): 100 mg via ORAL
  Filled 2016-09-22 (×2): qty 1

## 2016-09-22 MED ORDER — CLONAZEPAM 0.5 MG PO TABS
0.5000 mg | ORAL_TABLET | Freq: Four times a day (QID) | ORAL | Status: DC
  Administered 2016-09-22 – 2016-09-23 (×4): 0.5 mg via ORAL
  Filled 2016-09-22 (×4): qty 1

## 2016-09-22 MED ORDER — SENNOSIDES-DOCUSATE SODIUM 8.6-50 MG PO TABS: 1.0000 | ORAL_TABLET | Freq: Every evening | ORAL | Status: DC | PRN

## 2016-09-22 MED ORDER — SIMETHICONE 80 MG PO CHEW: 160.0000 mg | CHEWABLE_TABLET | Freq: Four times a day (QID) | ORAL | Status: DC | PRN

## 2016-09-22 MED ORDER — SPIRONOLACTONE 25 MG PO TABS
50.0000 mg | ORAL_TABLET | Freq: Every day | ORAL | Status: DC
  Administered 2016-09-23 – 2016-09-24 (×2): 50 mg via ORAL
  Filled 2016-09-22 (×2): qty 2

## 2016-09-22 MED ORDER — LORAZEPAM 1 MG PO TABS
1.0000 mg | ORAL_TABLET | Freq: Once | ORAL | Status: AC
  Administered 2016-09-22: 1 mg via ORAL
  Filled 2016-09-22: qty 1

## 2016-09-22 MED ORDER — TRAMADOL HCL 50 MG PO TABS
50.0000 mg | ORAL_TABLET | Freq: Four times a day (QID) | ORAL | Status: DC | PRN
  Administered 2016-09-23: 50 mg via ORAL
  Filled 2016-09-22: qty 1

## 2016-09-22 MED ORDER — POTASSIUM CHLORIDE CRYS ER 20 MEQ PO TBCR
40.0000 meq | EXTENDED_RELEASE_TABLET | Freq: Once | ORAL | Status: AC
  Administered 2016-09-22: 40 meq via ORAL
  Filled 2016-09-22: qty 2

## 2016-09-22 MED ORDER — FUROSEMIDE 40 MG PO TABS
40.0000 mg | ORAL_TABLET | Freq: Every day | ORAL | Status: DC
  Administered 2016-09-23 – 2016-09-24 (×2): 40 mg via ORAL
  Filled 2016-09-22 (×2): qty 1

## 2016-09-22 MED ORDER — SODIUM CHLORIDE 0.9% FLUSH
3.0000 mL | Freq: Two times a day (BID) | INTRAVENOUS | Status: DC
  Administered 2016-09-22 – 2016-09-24 (×4): 3 mL via INTRAVENOUS

## 2016-09-22 MED ORDER — ONDANSETRON HCL 4 MG PO TABS: 4.0000 mg | ORAL_TABLET | Freq: Four times a day (QID) | ORAL | Status: DC | PRN

## 2016-09-22 NOTE — ED Triage Notes (Addendum)
Pt has been feeling anxious and having difficulty sleeping. Has been out of [place] and [place]ivan for 1 week. Reports hallucinations at night. When pt asked if having SI/HI hesitated then states "everyone does sometimes". Will not answer question directly. Niece reports pt has been taking more than supposed to of medication.

## 2016-09-22 NOTE — ED Notes (Signed)
Pt reports he has been out of ativan for 1 week.  Pt reports hallucinations.  Hx etoh use.  Pt denies SI or HI.  Pt is calm and cooperative.  Pt denies feeling depressed but is anxious.  Pt alert. Niece in w ith pt in room 15.

## 2016-09-22 NOTE — H&P (Signed)
[facility] - [place] @ [facility] Admission History and Physical Tonye RoyaltyAlexis Riva Sesma, D.O.  ---------------------------------------------------------------------------------------------------------------------   PATIENT NAME: Jeff Preston MR#: [id number] DATE OF BIRTH: 1946-03-20 DATE OF ADMISSION: 09/22/2016 PRIMARY CARE PHYSICIAN: Jeff Preston, [name], Jeff Preston  REQUESTING/REFERRING PHYSICIAN: ED Dr. Roxan Hockeyobinson  CHIEF COMPLAINT: Chief Complaint  Patient presents with  . Medication Refill    HISTORY OF PRESENT ILLNESS:Please note that the history is obtained from the emergency department documentation and the patient's sister who I spoke with by phone. Patient's history is limited by alteration of mental status. Jeff Preston is a 70 y.o. male with a known history of Anxiety, depression, alcoholic liver cirrhosis, GERD, hypertension, chronic hyponatremia presents to the emergency department complaining of hallucinations and alteration of mental status.  Patient's sister reports that he has been using his Ativan more frequently than is prescribed and therefore ran out of his medication about a week ago. This patient's family became concerned when he started having worsening of his depression, hallucinations and psychomotor agitation. The patient himself denies any complaints at this time. He states that he has been eating and drinking, taking his medications as prescribed.  Of note patient has had paracentesis of the abdomen 5 times in the past month.  Otherwise there has been no change in status. Patient has been taking medication as prescribed and there has been no recent change in medication or diet.  There has been no recent illness, travel or sick contacts.    EMS/ED COURSE:   Patient received Ativan.  PAST MEDICAL HISTORY: Past Medical History:  Diagnosis Date  . Anxiety   . Anxiety   . Ascites   . Chronic hyponatremia   . Cirrhosis (HCC)   . Depression   . DNR (do not resuscitate)    . GERD (gastroesophageal reflux disease)   . Hypertension   . Umbilical hernia       PAST SURGICAL HISTORY: Past Surgical History:  Procedure Laterality Date  . CHOLECYSTECTOMY    . COLON SURGERY    . COLOSTOMY N/A   . THROAT SURGERY        SOCIAL HISTORY: Social History  Substance Use Topics  . Smoking status: Former Games developermoker  . Smokeless tobacco: Former NeurosurgeonUser    Quit date: 07/04/2016  . Alcohol use No      FAMILY HISTORY: Family History  Problem Relation Age of Onset  . CAD Mother      MEDICATIONS AT HOME: Prior to Admission medications   Medication Sig Start Date End Date Taking? Authorizing Provider  bisacodyl (DULCOLAX) 5 MG EC tablet Take 1 tablet (5 mg total) by mouth daily as needed for moderate constipation. 03/16/16  Yes Ramonita LabAruna Gouru, Jeff Preston  furosemide (LASIX) 40 MG tablet Take 1 tablet (40 mg total) by mouth daily. 05/01/16  Yes Srikar Sudini, Jeff Preston  lactulose (CHRONULAC) 10 GM/15ML solution Take 15 mLs (10 g total) by mouth daily. 09/06/16  Yes Sharman CheekPhillip Stafford, Jeff Preston  nadolol (CORGARD) 20 MG tablet Take 20 mg by mouth daily.   Yes Historical Provider, Jeff Preston  omeprazole (PRILOSEC) 20 MG capsule Take 20 mg by mouth 2 (two) times daily.    Yes Historical Provider, Jeff Preston  potassium chloride SA (K-DUR,KLOR-CON) 20 MEQ tablet Take 20 mEq by mouth 2 (two) times daily. 05/28/16  Yes Historical Provider, Jeff Preston  simethicone (MYLICON) 80 MG chewable tablet Chew 160-240 mg by mouth every 6 (six) hours as needed for flatulence.    Yes Historical Provider, Jeff Preston  sodium chloride 1 g tablet Take  1 tablet (1 g total) by mouth daily. 05/01/16  Yes Srikar Sudini, Jeff Preston  spironolactone (ALDACTONE) 50 MG tablet Take 50 mg by mouth daily. 04/14/16  Yes Historical Provider, Jeff Preston  traMADol (ULTRAM) 50 MG tablet Take 1 tablet (50 mg total) by mouth every 6 (six) hours as needed for moderate pain or severe pain. 05/01/16  Yes Milagros Loll, Jeff Preston      DRUG ALLERGIES: Allergies  Allergen Reactions  . Dilantin  [Phenytoin Sodium Extended] Rash  . Penicillins Rash and Other (See Comments)    Has patient had a PCN reaction causing immediate rash, facial/tongue/throat swelling, SOB or lightheadedness with hypotension: No Has patient had a PCN reaction causing severe rash involving mucus membranes or skin necrosis: No Has patient had a PCN reaction that required hospitalization No Has patient had a PCN reaction occurring within the last 10 years: No If all of the above answers are "NO", then may proceed with Cephalosporin use.     REVIEW OF SYSTEMS:Of note may not be reliable secondary to patient's altered mental status over patient denies all complaints at this time. CONSTITUTIONAL: No fatigue, weakness, fever, chills, weight gain/loss, headache EYES: No blurry or double vision. ENT: No tinnitus, postnasal drip, redness or soreness of the oropharynx. RESPIRATORY: No dyspnea, cough, wheeze, hemoptysis. CARDIOVASCULAR: No chest pain, orthopnea, palpitations, syncope. GASTROINTESTINAL: No nausea, vomiting, constipation, diarrhea, abdominal pain. No hematemesis, melena or hematochezia. GENITOURINARY: No dysuria, frequency, hematuria. ENDOCRINE: No polyuria or nocturia. No heat or cold intolerance. HEMATOLOGY: No anemia, bruising, bleeding. INTEGUMENTARY: No rashes, ulcers, lesions. MUSCULOSKELETAL: No pain, arthritis, swelling, gout. NEUROLOGIC: No numbness, tingling, weakness or ataxia. No seizure-type activity. PSYCHIATRIC: No anxiety, depression, insomnia.  PHYSICAL EXAMINATION: VITAL SIGNS: Blood pressure (!) 126/59, pulse 66, temperature 98.9 F (37.2 C), temperature source Oral, resp. rate 10, height 5\' 6"  (1.676 m), weight 99.8 kg (220 lb), SpO2 100 %.  GENERAL: 70 y.o.-year-old white male patient, well-developed, well-nourished lying in the bed in no acute distress.  Pleasant and cooperative.   HEENT: Head atraumatic, normocephalic. Pupils equal, round, reactive to light and accommodation.  No scleral icterus. Extraocular muscles intact. Oropharynx is clear. Mucus membranes moist. NECK: Supple, full range of motion. No JVD, no bruit heard. No cervical lymphadenopathy. CHEST: Normal breath sounds bilaterally. No wheezing, rales, rhonchi or crackles. No use of accessory muscles of respiration.  No reproducible chest wall tenderness.  CARDIOVASCULAR: S1, S2 normal. No murmurs, rubs, or gallops appreciated. Cap refill <2 seconds. ABDOMEN: Soft, nontender, nondistended. No rebound, guarding, rigidity. Normoactive bowel sounds present in all four quadrants. No organomegaly or mass. There is an umbilical hernia with some mild drainage, nonpurulent. GU: Significant edema of the testes. EXTREMITIES: Full range of motion. Positive bilateral lower extremity edema to the midcalf. NEUROLOGIC: Cranial nerves II through XII are grossly intact with no focal sensorimotor deficit. Muscle strength 5/5 in all extremities. Sensation intact. Gait not checked. Mild tremor. SKIN: Warm, dry, and intact without obvious rash, lesion, or ulcer.  LABORATORY PANEL:  CBC  Recent Labs Lab 09/22/16 1712  WBC 10.0  HGB 10.9*  HCT 32.7*  PLT 197   ----------------------------------------------------------------------------------------------------------------- Chemistries  Recent Labs Lab 09/22/16 1712  NA 129*  K 2.8*  CL 95*  CO2 24  GLUCOSE 93  BUN 11  CREATININE 1.42*  CALCIUM 7.8*  AST 47*  ALT 18  ALKPHOS 130*  BILITOT 1.8*   ------------------------------------------------------------------------------------------------------------------ Cardiac Enzymes No results for input(s): TROPONINI in the last 168 hours. ------------------------------------------------------------------------------------------------------------------  RADIOLOGY: Ct Head  Wo Contrast  Result Date: 09/22/2016 CLINICAL DATA:  Anxiety and difficulty sleeping. Hallucinations at night. EXAM: CT HEAD WITHOUT CONTRAST  TECHNIQUE: Contiguous axial images were obtained from the base of the skull through the vertex without intravenous contrast. COMPARISON:  04/18/1998 report FINDINGS: BRAIN: There is sulcal prominence with normal sized ventricles consistent with superficial atrophy. No intraparenchymal hemorrhage, mass effect nor midline shift. There are patchy periventricular and subcortical white matter hypodensities consistent with chronic small vessel ischemic disease. No acute large vascular territory infarcts. No abnormal extra-axial fluid collections. Basal cisterns are patent. VASCULAR: Mild calcific atherosclerosis of the carotid siphons. SKULL: No skull fracture. No significant scalp soft tissue swelling. SINUSES/ORBITS: The mastoid air-cells are clear. The included paranasal sinuses are well-aerated.The included ocular globes and orbital contents are non-suspicious. OTHER: None. IMPRESSION: Superficial atrophy with chronic appearing small vessel ischemic disease of periventricular white matter. No acute intracranial abnormality. Electronically Signed   By: Tollie Ethavid  Kwon M.D.   On: 09/22/2016 19:54    IMPRESSION AND PLAN:  This is a 70 y.o. male with a history of Anxiety, depression, alcoholic liver cirrhosis, GERD, hypertension, chronic hyponatremia now being admitted with:  1. Benzodiazepine withdrawal-we'll admit to inpatient for slow Klonopin taper. May consider adding gabapentin as an adjunct. Psychiatry consultation was requested. 2. Hypokalemia-we'll replace by mouth 3. Hyponatremia, chronic. We will monitor BMP in the a.m. 4. History of alcoholic liver cirrhosis. No signs of SBP at present and ammonia level is normal. however we'll monitor clinically. Continue Lasix, lactulose, nadolol and Aldactone. 5. History of GERD-continue Prilosec   Diet/Nutrition: Heart healthy Fluids: Hep-Lock DVT Px:  SCDs and early ambulation. Check PT/INR and if normal would consider heparin for DVT prophylaxis. Code  Status: Full. Patient reportedly lives at home with his son however we do not have contact information for her son at this point. Patient's sister is listed as his emergency contact. I did speak with her at this point she believes he would like to be full code. Further discussions regarding advanced rectus should be had with the patient's next of kin who is his son. Patient's sister will bring the son's contact information tomorrow.  All the records are reviewed and case discussed with ED provider. Management plans discussed with the patient and/or family who express understanding and agree with plan of care.   TOTAL TIME TAKING CARE OF THIS PATIENT: 60 minutes.   Allina Riches D.O. on 09/22/2016 at 9:11 PM Between 7am to 6pm - Pager - [phone] After 6pm go to [email or web address] - Social research officer, governmentpassword EPAS [facility] [place] [facility] Office [phone] CC: Primary care physician; Jeff Preston, [name], Jeff Preston     Note: This dictation was prepared with Dragon dictation along with smaller phrase technology. Any transcriptional errors that result from this process are unintentional.

## 2016-09-22 NOTE — ED Notes (Signed)
Patient transported to CT 

## 2016-09-22 NOTE — ED Notes (Signed)
Pt has 2 bags of clothes given to danielle griswald who is the niece.  Pt also has 1 yellow colored band ring given to family with clothes.

## 2016-09-22 NOTE — ED Provider Notes (Signed)
[facility] Emergency Department Provider Note    First MD Initiated Contact with Patient 09/22/16 1731     (approximate)  I have reviewed the triage vital signs and the nursing notes.   HISTORY  Chief Complaint Medication Refill    HPI Jeff Preston is a 70 y.o. male  with a history of anxiety depression as well as alcoholic cirrhosis presents with worsening hallucinations, depression and fear that he is withdrawing from his Ativan. Patient was reportedly having increasing Ativan use over the past several weeks and is running out of his medication at some point over the past week. Brought in by family due to altered mental status. No fevers. Denies any abdominal pain nausea or vomiting.   Past Medical History:  Diagnosis Date  . Anxiety   . Anxiety   . Ascites   . Chronic hyponatremia   . Cirrhosis (HCC)   . Depression   . DNR (do not resuscitate)   . GERD (gastroesophageal reflux disease)   . Hypertension   . Umbilical hernia    Family History  Problem Relation Age of Onset  . CAD Mother    Past Surgical History:  Procedure Laterality Date  . CHOLECYSTECTOMY    . COLON SURGERY    . COLOSTOMY N/A   . THROAT SURGERY     Patient Active Problem List   Diagnosis Date Noted  . Palliative care by specialist   . DNR (do not resuscitate)   . Hepatic cirrhosis (HCC) 04/28/2016  . Hyponatremia 04/26/2016  . Umbilical hernia with gangrene   . Open wound of umbilical region   . Ascites 03/12/2016      Prior to Admission medications   Medication Sig Start Date End Date Taking? Authorizing Provider  bisacodyl (DULCOLAX) 5 MG EC tablet Take 1 tablet (5 mg total) by mouth daily as needed for moderate constipation. 03/16/16   Ramonita LabAruna Gouru, MD  furosemide (LASIX) 40 MG tablet Take 1 tablet (40 mg total) by mouth daily. 05/01/16   Srikar Sudini, MD  lactulose (CHRONULAC) 10 GM/15ML solution Take 15 mLs (10 g total) by mouth daily. 09/06/16   Sharman CheekPhillip  Stafford, MD  nadolol (CORGARD) 20 MG tablet Take 20 mg by mouth daily.    Historical Provider, MD  omeprazole (PRILOSEC) 20 MG capsule Take 20 mg by mouth 2 (two) times daily.     Historical Provider, MD  potassium chloride SA (K-DUR,KLOR-CON) 20 MEQ tablet Take 20 mEq by mouth 2 (two) times daily. 05/28/16   Historical Provider, MD  simethicone (MYLICON) 80 MG chewable tablet Chew 160-240 mg by mouth every 6 (six) hours as needed for flatulence.     Historical Provider, MD  sodium chloride 1 g tablet Take 1 tablet (1 g total) by mouth daily. 05/01/16   Milagros LollSrikar Sudini, MD  spironolactone (ALDACTONE) 50 MG tablet Take 50 mg by mouth daily. 04/14/16   Historical Provider, MD  traMADol (ULTRAM) 50 MG tablet Take 1 tablet (50 mg total) by mouth every 6 (six) hours as needed for moderate pain or severe pain. 05/01/16   Milagros LollSrikar Sudini, MD    Allergies Dilantin [phenytoin sodium extended] and Penicillins    Social History Social History  Substance Use Topics  . Smoking status: Former Games developermoker  . Smokeless tobacco: Former NeurosurgeonUser    Quit date: 07/04/2016  . Alcohol use No    Review of Systems Patient denies headaches, rhinorrhea, blurry vision, numbness, shortness of breath, chest pain, edema, cough, abdominal  pain, nausea, vomiting, diarrhea, dysuria, fevers, rashes or hallucinations unless otherwise stated above in HPI. ____________________________________________   PHYSICAL EXAM:  VITAL SIGNS: Vitals:   09/22/16 1701  BP: (!) 118/48  Pulse: 72  Resp: 18  Temp: 98.9 F (37.2 C)    Constitutional: Patient is alert but in no acute distress. He is oriented to person place and time  Eyes: Conjunctivae are normal. PERRL. EOMI. Head: Atraumatic. Nose: No congestion/rhinnorhea. Mouth/Throat: Mucous membranes are moist.  Oropharynx non-erythematous. Neck: No stridor. Painless ROM. No cervical spine tenderness to palpation Hematological/Lymphatic/Immunilogical: No cervical  lymphadenopathy. Cardiovascular: Normal rate, regular rhythm. Grossly normal heart sounds.  Good peripheral circulation. Respiratory: Normal respiratory effort.  No retractions. Lungs CTAB. Gastrointestinal: Soft and nontender. No distention. No abdominal bruits. No CVA tenderness. Musculoskeletal: No lower extremity tenderness, 2+ bilateral edema.  No joint effusions. Neurologic:  Tongue fasciculations, no liver flap, no nystagmus no deficits are appreciated.  Skin:  Skin is warm, dry and intact. No rash noted. Psychiatric: Mood and affect blunted and withdrawn ____________________________________________   LABS (all labs ordered are listed, but only abnormal results are displayed)  Results for orders placed or performed during the hospital encounter of 09/22/16 (from the past 24 hour(s))  Comprehensive metabolic panel     Status: Abnormal   Collection Time: 09/22/16  5:12 PM  Result Value Ref Range   Sodium 129 (L) 135 - 145 mmol/L   Potassium 2.8 (L) 3.5 - 5.1 mmol/L   Chloride 95 (L) 101 - 111 mmol/L   CO2 24 22 - 32 mmol/L   Glucose, Bld 93 65 - 99 mg/dL   BUN 11 6 - 20 mg/dL   Creatinine, Ser 1.61 (H) 0.61 - 1.24 mg/dL   Calcium 7.8 (L) 8.9 - 10.3 mg/dL   Total Protein 6.7 6.5 - 8.1 g/dL   Albumin 3.0 (L) 3.5 - 5.0 g/dL   AST 47 (H) 15 - 41 U/L   ALT 18 17 - 63 U/L   Alkaline Phosphatase 130 (H) 38 - 126 U/L   Total Bilirubin 1.8 (H) 0.3 - 1.2 mg/dL   GFR calc non Af Amer 49 (L) >60 mL/min   GFR calc Af Amer 57 (L) >60 mL/min   Anion gap 10 5 - 15  Ethanol     Status: None   Collection Time: 09/22/16  5:12 PM  Result Value Ref Range   Alcohol, Ethyl (B) <5 <5 mg/dL  Salicylate level     Status: None   Collection Time: 09/22/16  5:12 PM  Result Value Ref Range   Salicylate Lvl <7.0 2.8 - 30.0 mg/dL  Acetaminophen level     Status: Abnormal   Collection Time: 09/22/16  5:12 PM  Result Value Ref Range   Acetaminophen (Tylenol), Serum <10 (L) 10 - 30 ug/mL  cbc      Status: Abnormal   Collection Time: 09/22/16  5:12 PM  Result Value Ref Range   WBC 10.0 3.8 - 10.6 K/uL   RBC 4.19 (L) 4.40 - 5.90 MIL/uL   Hemoglobin 10.9 (L) 13.0 - 18.0 g/dL   HCT 09.6 (L) 04.5 - 40.9 %   MCV 78.0 (L) 80.0 - 100.0 fL   MCH 26.1 26.0 - 34.0 pg   MCHC 33.5 32.0 - 36.0 g/dL   RDW 81.1 (H) 91.4 - 78.2 %   Platelets 197 150 - 440 K/uL   ____________________________________________  EKG____________________________________________  RADIOLOGY  I personally reviewed all radiographic images ordered to evaluate for the  above acute complaints and reviewed radiology reports and findings.  These findings were personally discussed with the patient.  Please see medical record for radiology report. ____________________________________________   PROCEDURES  Procedure(s) performed: none Procedures    Critical Care performed: no ____________________________________________   INITIAL IMPRESSION / ASSESSMENT AND PLAN / ED COURSE  Pertinent labs & imaging results that were available during my care of the patient were reviewed by me and considered in my medical decision making (see chart for details).  DDX: hepatic encephalopathy, withdrawal, depression ,psychosis, dehydration, medication noncompliance  Jeff Preston is a 70 y.o. who presents to the ED with Hallucinations and confusion after running out of Ativan at some point over the past week. Patient is AFVSS in ED. Exam as above. Given current presentation have considered the above differential.  Patient does not appear acutely homicidal or suicidal and his presentation concerning for acute benzodiazepine withdrawal. Less likely to have been for week ago like the family say states due to his chronic long-term abuse since would suspect him to present a much earlier as I suspect he will go to formal delirium tremens. Blood work otherwise at baseline despite a mild hypokalemia. CT head ordered due to concern for acute  traumatic injury shows none. I suspect that his hallucinations and altered mental status is secondary to benzodiazepine withdrawal. The patient will require admission for further evaluation and management.  Clinical Course      ____________________________________________   FINAL CLINICAL IMPRESSION(S) / ED DIAGNOSES  Final diagnoses:  Withdrawal from benzodiazepine, with delirium (HCC)  Hallucinations, visual      NEW MEDICATIONS STARTED DURING THIS VISIT:  New Prescriptions   No medications on file     Note:  This document was prepared using Dragon voice recognition software and may include unintentional dictation errors.    Willy EddyPatrick Rhetta Cleek, MD 09/22/16 2029

## 2016-09-23 DIAGNOSIS — F329 Major depressive disorder, single episode, unspecified: Secondary | ICD-10-CM

## 2016-09-23 DIAGNOSIS — F32A Depression, unspecified: Secondary | ICD-10-CM

## 2016-09-23 DIAGNOSIS — F13231 Sedative, hypnotic or anxiolytic dependence with withdrawal delirium: Secondary | ICD-10-CM

## 2016-09-23 LAB — MRSA PCR SCREENING: MRSA BY PCR: POSITIVE — AB

## 2016-09-23 LAB — CBC
HCT: 28.6 % — ABNORMAL LOW (ref 40.0–52.0)
HEMOGLOBIN: 9.5 g/dL — AB (ref 13.0–18.0)
MCH: 25.9 pg — AB (ref 26.0–34.0)
MCHC: 33.2 g/dL (ref 32.0–36.0)
MCV: 78.2 fL — AB (ref 80.0–100.0)
PLATELETS: 117 10*3/uL — AB (ref 150–440)
RBC: 3.66 MIL/uL — AB (ref 4.40–5.90)
RDW: 17 % — ABNORMAL HIGH (ref 11.5–14.5)
WBC: 3.5 10*3/uL — ABNORMAL LOW (ref 3.8–10.6)

## 2016-09-23 LAB — BASIC METABOLIC PANEL
Anion gap: 9 (ref 5–15)
BUN: 11 mg/dL (ref 6–20)
CALCIUM: 7.6 mg/dL — AB (ref 8.9–10.3)
CHLORIDE: 102 mmol/L (ref 101–111)
CO2: 24 mmol/L (ref 22–32)
CREATININE: 1.2 mg/dL (ref 0.61–1.24)
GFR calc non Af Amer: 60 mL/min — ABNORMAL LOW (ref >60)
GLUCOSE: 94 mg/dL (ref 65–99)
Potassium: 3.3 mmol/L — ABNORMAL LOW (ref 3.5–5.1)
Sodium: 135 mmol/L (ref 135–145)

## 2016-09-23 LAB — PROTIME-INR
INR: 1.32
PROTHROMBIN TIME: 16.5 s — AB (ref 11.4–15.2)

## 2016-09-23 MED ORDER — CHLORHEXIDINE GLUCONATE CLOTH 2 % EX PADS
6.0000 | MEDICATED_PAD | Freq: Every day | CUTANEOUS | Status: DC
  Administered 2016-09-23: 6 via TOPICAL

## 2016-09-23 MED ORDER — MUPIROCIN 2 % EX OINT
1.0000 "application " | TOPICAL_OINTMENT | Freq: Two times a day (BID) | CUTANEOUS | Status: DC
  Administered 2016-09-23 – 2016-09-24 (×3): 1 via NASAL
  Filled 2016-09-23: qty 22

## 2016-09-23 MED ORDER — ADULT MULTIVITAMIN W/MINERALS CH
1.0000 | ORAL_TABLET | Freq: Every day | ORAL | Status: DC
  Administered 2016-09-23 – 2016-09-24 (×2): 1 via ORAL
  Filled 2016-09-23 (×2): qty 1

## 2016-09-23 MED ORDER — MIRTAZAPINE 15 MG PO TABS
7.5000 mg | ORAL_TABLET | Freq: Every day | ORAL | Status: DC
  Administered 2016-09-23: 7.5 mg via ORAL
  Filled 2016-09-23: qty 1

## 2016-09-23 MED ORDER — ENSURE ENLIVE PO LIQD
237.0000 mL | Freq: Two times a day (BID) | ORAL | Status: DC
  Administered 2016-09-23 – 2016-09-24 (×3): 237 mL via ORAL

## 2016-09-23 MED ORDER — PANTOPRAZOLE SODIUM 40 MG PO TBEC
40.0000 mg | DELAYED_RELEASE_TABLET | Freq: Two times a day (BID) | ORAL | Status: DC
  Administered 2016-09-23 – 2016-09-24 (×2): 40 mg via ORAL
  Filled 2016-09-23 (×3): qty 1

## 2016-09-23 MED ORDER — MAGNESIUM SULFATE 2 GM/50ML IV SOLN
2.0000 g | Freq: Once | INTRAVENOUS | Status: AC
  Administered 2016-09-23: 2 g via INTRAVENOUS
  Filled 2016-09-23: qty 50

## 2016-09-23 MED ORDER — LORAZEPAM 1 MG PO TABS
1.0000 mg | ORAL_TABLET | Freq: Three times a day (TID) | ORAL | Status: DC
  Administered 2016-09-23 – 2016-09-24 (×3): 1 mg via ORAL
  Filled 2016-09-23 (×3): qty 1

## 2016-09-23 NOTE — Progress Notes (Signed)
Initial Nutrition Assessment  DOCUMENTATION CODES:   Not applicable  INTERVENTION:   Ensure Enlive po BID, each supplement provides 350 kcal and 20 grams of protein  Discussed importance of adequate nutrition, encouraged supplements at home   NUTRITION DIAGNOSIS:   Inadequate oral intake related to nausea, social / environmental circumstances as evidenced by per patient/family report.   GOAL:   Patient will meet greater than or equal to 90% of their needs   MONITOR:   PO intake, Supplement acceptance  REASON FOR ASSESSMENT:   Malnutrition Screening Tool    ASSESSMENT:   Patient with depression, alcoholic chirrosis, severe ascites, chronic hyponatremia, presenting for syncope   Met with patient in room today, patient lives alone and has limited assess to food. Patient's sister buys and delivers groceries, patient reports that he eats mostly eggs for breakfast and TV dinners for lunch and/or dinner and reports that he usually eats at least two meals a day. Patient reports that he has food in freezer but that he has not really felt like cooking lately. Patient reports that he has intermittent nausea/poor appetite possibly due to fluid accumulation in his abdomen. Patient likes Ensure and sometimes has Ensure at home. MRSA positive. Weight fluctuations r/t fluid changes.    Medications reviewed and include: Lasix, heparin, lactulose, pantoprazole, Kcl, NaCl tabs, spironolactone  Labs reviewed: K 3.3(L), Mg 1.6(L)-11/13 WBC 3.5(L), Hgb 9.5(L), Hct 28.6(L) AlkPhos 130(H), Alb 3.0(L), AST 47(H), Tbili 1.8(H)-labs from 11/13  Nutrition-Focused physical exam completed. Findings are mild/moderate fat depletion in orbital regions, mild/moderate muscle depletion in temporal regions, and no evidence of edema.      Diet Order:  Diet Heart Room service appropriate? Yes; Fluid consistency: Thin  Skin:   (MASD to groin)  Last BM:  09/23/16 per patient  Height:   Ht Readings  from Last 1 Encounters:  09/22/16 5' 6"  (1.676 m)    Weight:   Wt Readings from Last 1 Encounters:  09/22/16 220 lb (99.8 kg)    Ideal Body Weight:  64.5 kg  BMI:  Body mass index is 35.51 kg/m.  Estimated Nutritional Needs:   Kcal:  2000-2500kcal/day (20-25kcal/kg/day)  Protein:  110-130g/day (1.1-1.3g/kg/day)  Fluid:  2L/day(28m/kcal/day)  EDUCATION NEEDS:   Education needs addressed   CKoleen Distance RD, LDN

## 2016-09-23 NOTE — Plan of Care (Signed)
Problem: Safety: Goal: Ability to remain free from injury will improve Outcome: Progressing Pt has remained free from falls during my care. Pt is able to call appropriately for assistance.  Problem: Pain Managment: Goal: General experience of comfort will improve Outcome: Progressing Pt has remained pain free during my care.

## 2016-09-23 NOTE — Progress Notes (Signed)
[facility] [place] [facility]   PATIENT NAME: Jeff SchillerDouglas Ortman    MR#:  [id number]  DATE OF BIRTH:  11-19-1945  SUBJECTIVE: Patient admitted for  benzodiazepine withdrawal. Found to have hyponatremia, hypokalemia. Ativan dose has been increased recently by GI from 0.5 mg to 2 mg. So he was taking theAtivan, he ran out of the medications a week ago as per family.Saw Dr. Mechele CollinElliott on October 4. Patient says he feels better.   CHIEF COMPLAINT:   Chief Complaint  Patient presents with  . Medication Refill    REVIEW OF SYSTEMS:   ROS CONSTITUTIONAL: No fever, fatigue or weakness.  EYES: No blurred or double vision.  EARS, NOSE, AND THROAT: No tinnitus or ear pain.  RESPIRATORY: No cough, shortness of breath, wheezing or hemoptysis.  CARDIOVASCULAR: No chest pain, orthopnea, edema.  GASTROINTESTINAL: No nausea, vomiting, diarrhea or abdominal pain.  GENITOURINARY: No dysuria, hematuria.  ENDOCRINE: No polyuria, nocturia,  HEMATOLOGY: No anemia, easy bruising or bleeding SKIN: No rash or lesion. MUSCULOSKELETAL: No joint pain or arthritis.   NEUROLOGIC: No tingling, numbness, weakness.  PSYCHIATRY: No anxiety or depression.   DRUG ALLERGIES:   Allergies  Allergen Reactions  . Dilantin [Phenytoin Sodium Extended] Rash  . Penicillins Rash and Other (See Comments)    Has patient had a PCN reaction causing immediate rash, facial/tongue/throat swelling, SOB or lightheadedness with hypotension: No Has patient had a PCN reaction causing severe rash involving mucus membranes or skin necrosis: No Has patient had a PCN reaction that required hospitalization No Has patient had a PCN reaction occurring within the last 10 years: No If all of the above answers are "NO", then may proceed with Cephalosporin use.    VITALS:  Blood pressure (!) 115/59, pulse 61, temperature 98 F (36.7 C), temperature source Oral, resp. rate 19, height 5\' 6"  (1.676 m), weight 99.8 kg  (220 lb), SpO2 100 %.  PHYSICAL EXAMINATION:  GENERAL:  70 y.o.-year-old patient lying in the bed with no acute distress. Appears cachectic. EYES: Pupils equal, round, reactive to light and accommodation. No scleral icterus. Extraocular muscles intact.  HEENT: Head atraumatic, normocephalic. Oropharynx and nasopharynx clear.  NECK:  Supple, no jugular venous distention. No thyroid enlargement, no tenderness.  LUNGS: Normal breath sounds bilaterally, no wheezing, rales,rhonchi or crepitation. No use of accessory muscles of respiration.  CARDIOVASCULAR: S1, S2 normal. No murmurs, rubs, or gallops.  ABDOMEN: Soft, nontender, distended with ascites.. Bowel sounds present. No organomegaly or mass.  EXTREMITIES: No pedal edema, cyanosis, or clubbing.  NEUROLOGIC: Cranial nerves II through XII are intact. Muscle strength 5/5 in all extremities. Sensation intact. Gait not checked.  PSYCHIATRIC: The patient is alert and oriented x 3.  SKIN: No obvious rash, lesion, or ulcer.    LABORATORY PANEL:   CBC  Recent Labs Lab 09/23/16 0433  WBC 3.5*  HGB 9.5*  HCT 28.6*  PLT 117*   ------------------------------------------------------------------------------------------------------------------  Chemistries   Recent Labs Lab 09/22/16 1712 09/23/16 0433  NA 129* 135  K 2.8* 3.3*  CL 95* 102  CO2 24 24  GLUCOSE 93 94  BUN 11 11  CREATININE 1.42* 1.20  CALCIUM 7.8* 7.6*  MG 1.6*  --   AST 47*  --   ALT 18  --   ALKPHOS 130*  --   BILITOT 1.8*  --    ------------------------------------------------------------------------------------------------------------------  Cardiac Enzymes No results for input(s): TROPONINI in the last 168 hours. ------------------------------------------------------------------------------------------------------------------  RADIOLOGY:  Ct Head Wo  Contrast  Result Date: 09/22/2016 CLINICAL DATA:  Anxiety and difficulty sleeping. Hallucinations at  night. EXAM: CT HEAD WITHOUT CONTRAST TECHNIQUE: Contiguous axial images were obtained from the base of the skull through the vertex without intravenous contrast. COMPARISON:  04/18/1998 report FINDINGS: BRAIN: There is sulcal prominence with normal sized ventricles consistent with superficial atrophy. No intraparenchymal hemorrhage, mass effect nor midline shift. There are patchy periventricular and subcortical white matter hypodensities consistent with chronic small vessel ischemic disease. No acute large vascular territory infarcts. No abnormal extra-axial fluid collections. Basal cisterns are patent. VASCULAR: Mild calcific atherosclerosis of the carotid siphons. SKULL: No skull fracture. No significant scalp soft tissue swelling. SINUSES/ORBITS: The mastoid air-cells are clear. The included paranasal sinuses are well-aerated.The included ocular globes and orbital contents are non-suspicious. OTHER: None. IMPRESSION: Superficial atrophy with chronic appearing small vessel ischemic disease of periventricular white matter. No acute intracranial abnormality. Electronically Signed   By: Tollie Ethavid  Kwon M.D.   On: 09/22/2016 19:54    EKG:   Orders placed or performed during the hospital encounter of 09/22/16  . EKG 12-Lead  . EKG 12-Lead  . EKG 12-Lead  . EKG 12-Lead    ASSESSMENT AND PLAN:  #1 benzowithdrawals: Symptoms improved. On Klonopin.  Waiting for psychiatric input.  #2 LIVER disease, cirrhosis, ascites, peritoneal tap every month, next peritoneal tap is due tomorrow, after that patient can be discharged home. #3 hyponatremia, hypokalemia improving, replace the potassium, continue Lasix.  #4 continue nadolol,Aldactone,, lactulose,   All the records are reviewed and case discussed with Care Management/Social Workerr. Management plans discussed with the patient, family and they are in agreement.  CODE STATUS:full  TOTAL TIME TAKING CARE OF THIS PATIENT: 35minutes.   POSSIBLE D/C IN 1-2  DAYS, DEPENDING ON CLINICAL CONDITION.   Katha HammingKONIDENA,Kida Digiulio M.D on 09/23/2016 at 1:29 PM  Between 7am to 6pm - Pager - [phone]  After 6pm go to [email or web address] - password [email or web address] [facility]  [facility]  Office  [phone]  CC: Primary care physician; Lynnae PrudeELLIOTT, [name], MD   Note: This dictation was prepared with Dragon dictation along with smaller phrase technology. Any transcriptional errors that result from this process are unintentional.

## 2016-09-23 NOTE — Consult Note (Signed)
[facility] Face-to-Face Psychiatry Consult   Reason for Consult:  Consult for 70 year old man with history of ascites and liver disease. 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 Referring Physician:  Vianne Bulls Patient Identification: Jeff Preston MRN:  [id number] Principal Diagnosis: Benzodiazepine withdrawal [facility]) Diagnosis:   Patient Active Problem List   Diagnosis Date Noted  . Depression [F32.9] 09/23/2016  . Benzodiazepine withdrawal ([place]) [F13.239] 09/22/2016  . Palliative care by specialist [Z51.5]   . DNR (do not resuscitate) [Z66]   . Hepatic cirrhosis ([place]) [K74.60] 04/28/2016  . Hyponatremia [E87.1] 04/26/2016  . Umbilical hernia with gangrene [K42.1]   . Open wound of umbilical region [G64.403K]   . Ascites [R18.8] 03/12/2016    Total Time spent with patient: 1 hour  Subjective:   Jeff Preston is a 70 y.o. male patient admitted with "I was withdrawing from my Ativan".  HPI:  Patient interviewed. Chart reviewed. Patient came into the hospital yesterday because family noticed that he was more confused and was hallucinating and seemed more out of sorts with his mood. Patient today seems to be much better because he can tell me that he was overusing his Ativan. He says that his dose of Ativan had recently been increased to 2 mg and that he thinks that in doing that it became excessive and he got confused. He admits that he was taking excessive amounts of it and ran out early. Also ran out of his Ambien. Was having trouble sleeping and he knows he started to get more confused. Admits that he was hallucinating at times. This afternoon is feeling much better. Not having any hallucinations now. Not feeling particularly shaky. He has long-standing generalized anxiety and depression. He admits to passive suicidal thoughts but has no intent or plan to act on it.  Social history: Patient lives with his son but the son works full time  and is not around very much. Patient manages his own medicine.  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  Past Psychiatric History: Patient says he's had chronic anxiety and depression. He reports trials of 2 antidepressants that he can remember including Effexor and Zoloft but thought that neither of them were well tolerated because they made him feel more anxious. Denies ever trying to kill himself or being in the hospital.  Risk to Self: Is patient at risk for suicide?: No Risk to Others:   Prior Inpatient Therapy:   Prior Outpatient Therapy:    Past Medical History:  Past Medical History:  Diagnosis Date  . Anxiety   . Anxiety   . Ascites   . Chronic hyponatremia   . Cirrhosis ([place])   . Depression   . DNR (do not resuscitate)   . GERD (gastroesophageal reflux disease)   . Hypertension   . Umbilical hernia     Past Surgical History:  Procedure Laterality Date  . CHOLECYSTECTOMY    . COLON SURGERY    . COLOSTOMY N/A   . THROAT SURGERY     Family History:  Family History  Problem Relation Age of Onset  . CAD Mother    Family Psychiatric  History: Not aware of any family history Social History:  History  Alcohol Use No     History  Drug Use No    Social History   Social History  . Marital status: Single    Spouse name: N/A  . Number of children: N/A  . Years of  education: N/A   Social History Main Topics  . Smoking status: Former Research scientist (life sciences)  . Smokeless tobacco: Former Systems developer    Quit date: 07/04/2016  . Alcohol use No  . Drug use: No  . Sexual activity: No   Other Topics Concern  . None   Social History Narrative  . None   Additional Social History:    Allergies:   Allergies  Allergen Reactions  . Dilantin [Phenytoin Sodium Extended] Rash  . Penicillins Rash and Other (See Comments)    Has patient had a PCN reaction causing immediate rash, facial/tongue/throat swelling, SOB or  lightheadedness with hypotension: No Has patient had a PCN reaction causing severe rash involving mucus membranes or skin necrosis: No Has patient had a PCN reaction that required hospitalization No Has patient had a PCN reaction occurring within the last 10 years: No If all of the above answers are "NO", then may proceed with Cephalosporin use.    Labs:  Results for orders placed or performed during the hospital encounter of 09/22/16 (from the past 48 hour(s))  Comprehensive metabolic panel     Status: Abnormal   Collection Time: 09/22/16  5:12 PM  Result Value Ref Range   Sodium 129 (L) 135 - 145 mmol/L   Potassium 2.8 (L) 3.5 - 5.1 mmol/L   Chloride 95 (L) 101 - 111 mmol/L   CO2 24 22 - 32 mmol/L   Glucose, Bld 93 65 - 99 mg/dL   BUN 11 6 - 20 mg/dL   Creatinine, Ser 1.42 (H) 0.61 - 1.24 mg/dL   Calcium 7.8 (L) 8.9 - 10.3 mg/dL   Total Protein 6.7 6.5 - 8.1 g/dL   Albumin 3.0 (L) 3.5 - 5.0 g/dL   AST 47 (H) 15 - 41 U/L   ALT 18 17 - 63 U/L   Alkaline Phosphatase 130 (H) 38 - 126 U/L   Total Bilirubin 1.8 (H) 0.3 - 1.2 mg/dL   GFR calc non Af Amer 49 (L) >60 mL/min   GFR calc Af Amer 57 (L) >60 mL/min    Comment: (NOTE) The eGFR has been calculated using the CKD EPI equation. This calculation has not been validated in all clinical situations. eGFR's persistently <60 mL/min signify possible Chronic Kidney Disease.    Anion gap 10 5 - 15  Ethanol     Status: None   Collection Time: 09/22/16  5:12 PM  Result Value Ref Range   Alcohol, Ethyl (B) <5 <5 mg/dL    Comment:        LOWEST DETECTABLE LIMIT FOR SERUM ALCOHOL IS 5 mg/dL FOR MEDICAL PURPOSES ONLY   Salicylate level     Status: None   Collection Time: 09/22/16  5:12 PM  Result Value Ref Range   Salicylate Lvl <1.8 2.8 - 30.0 mg/dL  Acetaminophen level     Status: Abnormal   Collection Time: 09/22/16  5:12 PM  Result Value Ref Range   Acetaminophen (Tylenol), Serum <10 (L) 10 - 30 ug/mL    Comment:         THERAPEUTIC CONCENTRATIONS VARY SIGNIFICANTLY. A RANGE OF 10-30 ug/mL MAY BE AN EFFECTIVE CONCENTRATION FOR MANY PATIENTS. HOWEVER, SOME ARE BEST TREATED AT CONCENTRATIONS OUTSIDE THIS RANGE. ACETAMINOPHEN CONCENTRATIONS >150 ug/mL AT 4 HOURS AFTER INGESTION AND >50 ug/mL AT 12 HOURS AFTER INGESTION ARE OFTEN ASSOCIATED WITH TOXIC REACTIONS.   cbc     Status: Abnormal   Collection Time: 09/22/16  5:12 PM  Result Value Ref Range  WBC 10.0 3.8 - 10.6 K/uL   RBC 4.19 (L) 4.40 - 5.90 MIL/uL   Hemoglobin 10.9 (L) 13.0 - 18.0 g/dL   HCT 32.7 (L) 40.0 - 52.0 %   MCV 78.0 (L) 80.0 - 100.0 fL   MCH 26.1 26.0 - 34.0 pg   MCHC 33.5 32.0 - 36.0 g/dL   RDW 17.0 (H) 11.5 - 14.5 %   Platelets 197 150 - 440 K/uL  Magnesium     Status: Abnormal   Collection Time: 09/22/16  5:12 PM  Result Value Ref Range   Magnesium 1.6 (L) 1.7 - 2.4 mg/dL  Phosphorus     Status: None   Collection Time: 09/22/16  5:12 PM  Result Value Ref Range   Phosphorus 3.3 2.5 - 4.6 mg/dL  Urine Drug Screen, Qualitative     Status: Abnormal   Collection Time: 09/22/16  5:50 PM  Result Value Ref Range   Tricyclic, Ur Screen NONE DETECTED NONE DETECTED   Amphetamines, Ur Screen NONE DETECTED NONE DETECTED   MDMA (Ecstasy)Ur Screen NONE DETECTED NONE DETECTED   Cocaine Metabolite,Ur [place] NONE DETECTED NONE DETECTED   Opiate, Ur Screen NONE DETECTED NONE DETECTED   Phencyclidine (PCP) Ur S NONE DETECTED NONE DETECTED   Cannabinoid 50 Ng, Ur [place] NONE DETECTED NONE DETECTED   Barbiturates, Ur Screen NONE DETECTED NONE DETECTED   Benzodiazepine, Ur Scrn POSITIVE (A) NONE DETECTED   Methadone Scn, Ur NONE DETECTED NONE DETECTED    Comment: (NOTE) 262  Tricyclics, urine               Cutoff 1000 ng/mL 200  Amphetamines, urine             Cutoff 1000 ng/mL 300  MDMA (Ecstasy), urine           Cutoff 500 ng/mL 400  Cocaine Metabolite, urine       Cutoff 300 ng/mL 500  Opiate, urine                   Cutoff 300 ng/mL 600   Phencyclidine (PCP), urine      Cutoff 25 ng/mL 700  Cannabinoid, urine              Cutoff 50 ng/mL 800  Barbiturates, urine             Cutoff 200 ng/mL 900  Benzodiazepine, urine           Cutoff 200 ng/mL 1000 Methadone, urine                Cutoff 300 ng/mL 1100 1200 The urine drug screen provides only a preliminary, unconfirmed 1300 analytical test result and should not be used for non-medical 1400 purposes. Clinical consideration and professional judgment should 1500 be applied to any positive drug screen result due to possible 1600 interfering substances. A more specific alternate chemical method 1700 must be used in order to obtain a confirmed analytical result.  1800 Gas chromato graphy / mass spectrometry (GC/MS) is the preferred 1900 confirmatory method.   Ammonia     Status: None   Collection Time: 09/22/16  6:11 PM  Result Value Ref Range   Ammonia 26 9 - 35 umol/L  MRSA PCR Screening     Status: Abnormal   Collection Time: 09/22/16 11:28 PM  Result Value Ref Range   MRSA by PCR POSITIVE (A) NEGATIVE    Comment:        The GeneXpert MRSA Assay (FDA approved for  NASAL specimens only), is one component of a comprehensive MRSA colonization surveillance program. It is not intended to diagnose MRSA infection nor to guide or monitor treatment for MRSA infections. RESULT CALLED TO, READ BACK BY AND VERIFIED WITH: OLIVIA BOATNER ON 09/22/16 AT 0124 BY TLB   Aerobic Culture (superficial specimen)     Status: None (Preliminary result)   Collection Time: 09/22/16 11:31 PM  Result Value Ref Range   Specimen Description ABDOMEN    Special Requests NONE    Gram Stain      NO WBC SEEN ABUNDANT GRAM NEGATIVE RODS ABUNDANT GRAM POSITIVE COCCI IN PAIRS IN CLUSTERS Performed at [facility]    Culture PENDING    Report Status PENDING   Basic metabolic panel     Status: Abnormal   Collection Time: 09/23/16  4:33 AM  Result Value Ref Range   Sodium 135 135 - 145  mmol/L   Potassium 3.3 (L) 3.5 - 5.1 mmol/L   Chloride 102 101 - 111 mmol/L   CO2 24 22 - 32 mmol/L   Glucose, Bld 94 65 - 99 mg/dL   BUN 11 6 - 20 mg/dL   Creatinine, Ser 1.20 0.61 - 1.24 mg/dL   Calcium 7.6 (L) 8.9 - 10.3 mg/dL   GFR calc non Af Amer 60 (L) >60 mL/min   GFR calc Af Amer >60 >60 mL/min    Comment: (NOTE) The eGFR has been calculated using the CKD EPI equation. This calculation has not been validated in all clinical situations. eGFR's persistently <60 mL/min signify possible Chronic Kidney Disease.    Anion gap 9 5 - 15  CBC     Status: Abnormal   Collection Time: 09/23/16  4:33 AM  Result Value Ref Range   WBC 3.5 (L) 3.8 - 10.6 K/uL   RBC 3.66 (L) 4.40 - 5.90 MIL/uL   Hemoglobin 9.5 (L) 13.0 - 18.0 g/dL   HCT 28.6 (L) 40.0 - 52.0 %   MCV 78.2 (L) 80.0 - 100.0 fL   MCH 25.9 (L) 26.0 - 34.0 pg   MCHC 33.2 32.0 - 36.0 g/dL   RDW 17.0 (H) 11.5 - 14.5 %   Platelets 117 (L) 150 - 440 K/uL  Protime-INR     Status: Abnormal   Collection Time: 09/23/16  4:33 AM  Result Value Ref Range   Prothrombin Time 16.5 (H) 11.4 - 15.2 seconds   INR 1.32     Current Facility-Administered Medications  Medication Dose Route Frequency Provider Last Rate Last Dose  . bisacodyl (DULCOLAX) EC tablet 5 mg  5 mg Oral Daily PRN [employer], DO      . Chlorhexidine Gluconate Cloth 2 % PADS 6 each  6 each Topical Q0600 Alexis Hugelmeyer, DO   6 each at 09/23/16 0600  . feeding supplement (ENSURE ENLIVE) (ENSURE ENLIVE) liquid 237 mL  237 mL Oral BID BM Epifanio Lesches, MD   237 mL at 09/23/16 1706  . furosemide (LASIX) tablet 40 mg  40 mg Oral Daily Alexis Hugelmeyer, DO   40 mg at 09/23/16 1022  . gabapentin (NEURONTIN) capsule 100 mg  100 mg Oral BID AC Alexis Hugelmeyer, DO   100 mg at 09/23/16 1705  . gabapentin (NEURONTIN) capsule 100 mg  100 mg Oral QHS Alexis Hugelmeyer, DO   100 mg at 09/22/16 2317  . lactulose (CHRONULAC) 10 GM/15ML solution 10 g  10 g Oral Daily  Alexis Hugelmeyer, DO   10 g at 09/23/16 1022  .  LORazepam (ATIVAN) tablet 1 mg  1 mg Oral TID Gonzella Lex, MD      . magnesium citrate solution 1 Bottle  1 Bottle Oral Once PRN Alexis Hugelmeyer, DO      . mirtazapine (REMERON) tablet 7.5 mg  7.5 mg Oral QHS Gonzella Lex, MD      . multivitamin with minerals tablet 1 tablet  1 tablet Oral Daily Epifanio Lesches, MD   1 tablet at 09/23/16 1705  . mupirocin ointment (BACTROBAN) 2 % 1 application  1 application Nasal BID Alexis Hugelmeyer, DO   1 application at 01/75/10 1023  . nadolol (CORGARD) tablet 20 mg  20 mg Oral Daily Alexis Hugelmeyer, DO   20 mg at 09/23/16 1022  . ondansetron (ZOFRAN) tablet 4 mg  4 mg Oral Q6H PRN Alexis Hugelmeyer, DO       Or  . ondansetron (ZOFRAN) injection 4 mg  4 mg Intravenous Q6H PRN Alexis Hugelmeyer, DO      . pantoprazole (PROTONIX) EC tablet 40 mg  40 mg Oral BID Epifanio Lesches, MD      . potassium chloride SA (K-DUR,KLOR-CON) CR tablet 40 mEq  40 mEq Oral BID Alexis Hugelmeyer, DO   40 mEq at 09/23/16 1021  . senna-docusate (Senokot-S) tablet 1 tablet  1 tablet Oral QHS PRN Alexis Hugelmeyer, DO      . simethicone (MYLICON) chewable tablet 160-240 mg  160-240 mg Oral Q6H PRN Alexis Hugelmeyer, DO      . sodium chloride flush (NS) 0.9 % injection 3 mL  3 mL Intravenous Q12H Alexis Hugelmeyer, DO   3 mL at 09/23/16 1209  . sodium chloride tablet 1 g  1 g Oral Daily Alexis Hugelmeyer, DO   1 g at 09/23/16 1022  . spironolactone (ALDACTONE) tablet 50 mg  50 mg Oral Daily Alexis Hugelmeyer, DO   50 mg at 09/23/16 1022  . traMADol (ULTRAM) tablet 50 mg  50 mg Oral Q6H PRN Harvie Bridge, DO        Musculoskeletal: Strength & Muscle Tone: decreased Gait & Station: unable to stand Patient leans: N/A  Psychiatric Specialty Exam: Physical Exam  Nursing note and vitals reviewed. Constitutional: He appears well-developed and well-nourished.  HENT:  Head: Normocephalic and atraumatic.  Eyes:  Conjunctivae are normal. Pupils are equal, round, and reactive to light.  Neck: Normal range of motion.  Cardiovascular: Normal heart sounds.   Respiratory: Effort normal.  GI: Soft. He exhibits distension.  Musculoskeletal: Normal range of motion.  Neurological: He is alert.  Skin: Skin is warm and dry.  Psychiatric: His affect is blunt. His speech is delayed. He is slowed. He expresses no homicidal and no suicidal ideation. He exhibits abnormal recent memory.    Review of Systems  Constitutional: Negative.   HENT: Negative.   Eyes: Negative.   Respiratory: Negative.   Cardiovascular: Negative.   Gastrointestinal: Negative.   Musculoskeletal: Negative.   Skin: Negative.   Neurological: Negative.   Psychiatric/Behavioral: Positive for depression, hallucinations, memory loss, substance abuse and suicidal ideas. The patient is nervous/anxious and has insomnia.     Blood pressure (!) 115/59, pulse 61, temperature 98 F (36.7 C), temperature source Oral, resp. rate 19, height 5' 6"  (1.676 m), weight 99.8 kg (220 lb), SpO2 100 %.Body mass index is 35.51 kg/m.  General Appearance: Disheveled  Eye Contact:  Fair  Speech:  Slow  Volume:  Decreased  Mood:  Dysphoric  Affect:  Constricted  Thought Process:  Disorganized  Orientation:  Full (Time, Place, and Person)  Thought Content:  Tangential  Suicidal Thoughts:  Yes.  without intent/plan  Homicidal Thoughts:  No  Memory:  Immediate;   Fair Recent;   Poor Remote;   Fair  Judgement:  Fair  Insight:  Fair  Psychomotor Activity:  Decreased  Concentration:  Concentration: Fair  Recall:  [employer] of Knowledge:  Fair  Language:  Fair  Akathisia:  No  Handed:  Right  AIMS (if indicated):     Assets:  Communication Skills Desire for Improvement Housing Social Support  ADL's:  Impaired  Cognition:  Impaired,  Mild  Sleep:        Treatment Plan Summary: Daily contact with patient to assess and evaluate symptoms and  progress in treatment, Medication management and Plan 70 year old man who came in to the hospital after misusing his lorazepam. Apparently this is not a regular thing for him. He admits that it was a mistake. I think it's not necessary to absolutely get him off of all of his benzodiazepines unless that is what his primary care doctor wants to do. I am going to discontinue the clonazepam and put him back on his familiar lorazepam but at a regular small dose of a milligram 3 times a day. Patient really probably should not be managing his own medicine at home but needs assistance with making sure he does not make medicine mistakes. I also proposed starting him on mirtazapine 7.5 mg at night to help with sleep and depression. Patient agrees to this. I will follow-up as needed.  Disposition: Patient does not meet criteria for psychiatric inpatient admission. Supportive therapy provided about ongoing stressors.  Alethia Berthold, MD 09/23/2016 8:02 PM

## 2016-09-24 ENCOUNTER — Ambulatory Visit: Admission: RE | Admit: 2016-09-24 | Payer: Medicare Other | Source: Ambulatory Visit

## 2016-09-24 ENCOUNTER — Inpatient Hospital Stay: Payer: Medicare Other

## 2016-09-24 ENCOUNTER — Ambulatory Visit: Payer: Medicare Other

## 2016-09-24 LAB — CBC
HEMATOCRIT: 29.6 % — AB (ref 40.0–52.0)
Hemoglobin: 9.8 g/dL — ABNORMAL LOW (ref 13.0–18.0)
MCH: 26.3 pg (ref 26.0–34.0)
MCHC: 33.1 g/dL (ref 32.0–36.0)
MCV: 79.3 fL — AB (ref 80.0–100.0)
PLATELETS: 118 10*3/uL — AB (ref 150–440)
RBC: 3.73 MIL/uL — AB (ref 4.40–5.90)
RDW: 17.3 % — ABNORMAL HIGH (ref 11.5–14.5)
WBC: 4.2 10*3/uL (ref 3.8–10.6)

## 2016-09-24 LAB — ALBUMIN, FLUID (OTHER): Albumin, Fluid: <1 g/dL

## 2016-09-24 LAB — BODY FLUID CELL COUNT WITH DIFFERENTIAL
Eos, Fluid: 0 %
Lymphs, Fluid: 38 %
Monocyte-Macrophage-Serous Fluid: 40 %
Neutrophil Count, Fluid: 22 %
Other Cells, Fluid: 0 %
Total Nucleated Cell Count, Fluid: 154 cu mm

## 2016-09-24 LAB — BASIC METABOLIC PANEL
ANION GAP: 4 — AB (ref 5–15)
BUN: 16 mg/dL (ref 6–20)
CALCIUM: 7.6 mg/dL — AB (ref 8.9–10.3)
CHLORIDE: 103 mmol/L (ref 101–111)
CO2: 30 mmol/L (ref 22–32)
Creatinine, Ser: 1.43 mg/dL — ABNORMAL HIGH (ref 0.61–1.24)
GFR calc Af Amer: 56 mL/min — ABNORMAL LOW (ref >60)
GFR calc non Af Amer: 48 mL/min — ABNORMAL LOW (ref >60)
GLUCOSE: 111 mg/dL — AB (ref 65–99)
POTASSIUM: 4 mmol/L (ref 3.5–5.1)
Sodium: 137 mmol/L (ref 135–145)

## 2016-09-24 LAB — LACTATE DEHYDROGENASE, PLEURAL OR PERITONEAL FLUID: LD FL: 36 U/L — AB (ref 3–23)

## 2016-09-24 LAB — MAGNESIUM: Magnesium: 2.3 mg/dL (ref 1.7–2.4)

## 2016-09-24 LAB — GLUCOSE, SEROUS FLUID: Glucose, Fluid: 130 mg/dL

## 2016-09-24 LAB — PROTEIN, BODY FLUID: Total protein, fluid: <3 g/dL

## 2016-09-24 MED ORDER — MIRTAZAPINE 15 MG PO TABS: 15.0000 mg | ORAL_TABLET | Freq: Every day | ORAL | Status: DC

## 2016-09-24 MED ORDER — LORAZEPAM 1 MG PO TABS: 1.0000 mg | ORAL_TABLET | Freq: Three times a day (TID) | ORAL | 0 refills | Status: DC

## 2016-09-24 MED ORDER — MIRTAZAPINE 7.5 MG PO TABS: 7.5000 mg | ORAL_TABLET | Freq: Every day | ORAL | 0 refills | Status: DC

## 2016-09-24 MED ORDER — ALBUMIN HUMAN 25 % IV SOLN
12.5000 g | Freq: Once | INTRAVENOUS | Status: AC
  Administered 2016-09-24: 12.5 g via INTRAVENOUS
  Filled 2016-09-24: qty 50

## 2016-09-24 MED ORDER — MIRTAZAPINE 15 MG PO TABS: 15.0000 mg | ORAL_TABLET | Freq: Every day | ORAL | 0 refills | Status: DC

## 2016-09-24 MED ORDER — ENSURE ENLIVE PO LIQD: 237.0000 mL | Freq: Two times a day (BID) | ORAL | 12 refills | Status: AC

## 2016-09-24 NOTE — Progress Notes (Signed)
[facility] [place] [facility]   PATIENT NAME: Jeff Preston    MR#:  [id number]  DATE OF BIRTH:  11-Apr-1946  SUBJECTIVE: Patient feels better today/,6  liters of fluid drained from his ascites..   CHIEF COMPLAINT:   Chief Complaint  Patient presents with  . Medication Refill    REVIEW OF SYSTEMS:   ROS CONSTITUTIONAL: No fever, fatigue or weakness.  EYES: No blurred or double vision.  EARS, NOSE, AND THROAT: No tinnitus or ear pain.  RESPIRATORY: No cough, shortness of breath, wheezing or hemoptysis.  CARDIOVASCULAR: No chest pain, orthopnea, edema.  GASTROINTESTINAL: No nausea, vomiting, diarrhea or abdominal pain.  GENITOURINARY: No dysuria, hematuria.  ENDOCRINE: No polyuria, nocturia,  HEMATOLOGY: No anemia, easy bruising or bleeding SKIN: No rash or lesion. MUSCULOSKELETAL: No joint pain or arthritis.   NEUROLOGIC: No tingling, numbness, weakness.  PSYCHIATRY: No anxiety or depression.   DRUG ALLERGIES:   Allergies  Allergen Reactions  . Dilantin [Phenytoin Sodium Extended] Rash  . Penicillins Rash and Other (See Comments)    Has patient had a PCN reaction causing immediate rash, facial/tongue/throat swelling, SOB or lightheadedness with hypotension: No Has patient had a PCN reaction causing severe rash involving mucus membranes or skin necrosis: No Has patient had a PCN reaction that required hospitalization No Has patient had a PCN reaction occurring within the last 10 years: No If all of the above answers are "NO", then may proceed with Cephalosporin use.    VITALS:  Blood pressure 112/60, pulse 72, temperature 98.3 F (36.8 C), temperature source Oral, resp. rate 18, height 5\' 6"  (1.676 m), weight 99.8 kg (220 lb), SpO2 100 %.  PHYSICAL EXAMINATION:  GENERAL:  70 y.o.-year-old patient lying in the bed with no acute distress. Appears cachectic. EYES: Pupils equal, round, reactive to light and accommodation. No scleral icterus.  Extraocular muscles intact.  HEENT: Head atraumatic, normocephalic. Oropharynx and nasopharynx clear.  NECK:  Supple, no jugular venous distention. No thyroid enlargement, no tenderness.  LUNGS: Normal breath sounds bilaterally, no wheezing, rales,rhonchi or crepitation. No use of accessory muscles of respiration.  CARDIOVASCULAR: S1, S2 normal. No murmurs, rubs, or gallops.  ABDOMEN: Soft, nontender, distended with ascites.. Bowel sounds present. No organomegaly or mass.  EXTREMITIES: No pedal edema, cyanosis, or clubbing.  NEUROLOGIC: Cranial nerves II through XII are intact. Muscle strength 5/5 in all extremities. Sensation intact. Gait not checked.  PSYCHIATRIC: The patient is alert and oriented x 3.  SKIN: No obvious rash, lesion, or ulcer.    LABORATORY PANEL:   CBC  Recent Labs Lab 09/24/16 0440  WBC 4.2  HGB 9.8*  HCT 29.6*  PLT 118*   ------------------------------------------------------------------------------------------------------------------  Chemistries   Recent Labs Lab 09/22/16 1712  09/24/16 0440  NA 129*  < > 137  K 2.8*  < > 4.0  CL 95*  < > 103  CO2 24  < > 30  GLUCOSE 93  < > 111*  BUN 11  < > 16  CREATININE 1.42*  < > 1.43*  CALCIUM 7.8*  < > 7.6*  MG 1.6*  --  2.3  AST 47*  --   --   ALT 18  --   --   ALKPHOS 130*  --   --   BILITOT 1.8*  --   --   < > = values in this interval not displayed. ------------------------------------------------------------------------------------------------------------------  Cardiac Enzymes No results for input(s): TROPONINI in the last 168 hours. ------------------------------------------------------------------------------------------------------------------  RADIOLOGY:  Ct Head Wo Contrast  Result Date: 09/22/2016 CLINICAL DATA:  Anxiety and difficulty sleeping. Hallucinations at night. EXAM: CT HEAD WITHOUT CONTRAST TECHNIQUE: Contiguous axial images were obtained from the base of the skull through  the vertex without intravenous contrast. COMPARISON:  04/18/1998 report FINDINGS: BRAIN: There is sulcal prominence with normal sized ventricles consistent with superficial atrophy. No intraparenchymal hemorrhage, mass effect nor midline shift. There are patchy periventricular and subcortical white matter hypodensities consistent with chronic small vessel ischemic disease. No acute large vascular territory infarcts. No abnormal extra-axial fluid collections. Basal cisterns are patent. VASCULAR: Mild calcific atherosclerosis of the carotid siphons. SKULL: No skull fracture. No significant scalp soft tissue swelling. SINUSES/ORBITS: The mastoid air-cells are clear. The included paranasal sinuses are well-aerated.The included ocular globes and orbital contents are non-suspicious. OTHER: None. IMPRESSION: Superficial atrophy with chronic appearing small vessel ischemic disease of periventricular white matter. No acute intracranial abnormality. Electronically Signed   By: Tollie Ethavid  Kwon M.D.   On: 09/22/2016 19:54    EKG:   Orders placed or performed during the hospital encounter of 09/22/16  . EKG 12-Lead  . EKG 12-Lead  . EKG 12-Lead  . EKG 12-Lead    ASSESSMENT AND PLAN:  #1 benzowithdrawals: Symptoms improved.Seen by psychiatric, restarted Ativan 1 mg by mouth 3 times a day. Expectant to the patient that he needs to be complaint with the Ativan same dose and not to take the higher dose.  #2 LIVERcirrhosis, ascites, peritoneal tap every month, status post therapeutic paracentesis this morning, 6 L of fluid drained. Is giving him  Albumin , after the patient can be discharged home.  #3 hyponatremia, hypokalemia improving, replace the potassium, continue Lasix.  #4 continue nadolol,Aldactone,, lactulose, Discharge home today after albumin infusion.  All the records are reviewed and case discussed with Care Management/Social Workerr. Management plans discussed with the patient, family and they are in  agreement.  CODE STATUS:full  TOTAL TIME TAKING CARE OF THIS PATIENT: 35minutes.     Katha HammingKONIDENA,Asaiah Hunnicutt M.D on 09/24/2016 at 1:17 PM  Between 7am to 6pm - Pager - [phone]  After 6pm go to [email or web address] - password [email or web address] [facility]  [facility]  Office  [phone]  CC: Primary care physician; Lynnae PrudeELLIOTT, [name], MD   Note: This dictation was prepared with Dragon dictation along with smaller phrase technology. Any transcriptional errors that result from this process are unintentional.

## 2016-09-24 NOTE — Consult Note (Signed)
[facility] Face-to-Face Psychiatry Consult   Reason for Consult:  Consult for 70 year old man with history of ascites and liver disease. 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 Referring Physician:  Vianne Bulls Patient Identification: PORTER MOES MRN:  [id number] Principal Diagnosis: Benzodiazepine withdrawal [facility]) Diagnosis:   Patient Active Problem List   Diagnosis Date Noted  . Depression [F32.9] 09/23/2016  . Benzodiazepine withdrawal ([place]) [F13.239] 09/22/2016  . Palliative care by specialist [Z51.5]   . DNR (do not resuscitate) [Z66]   . Hepatic cirrhosis ([place]) [K74.60] 04/28/2016  . Hyponatremia [E87.1] 04/26/2016  . Umbilical hernia with gangrene [K42.1]   . Open wound of umbilical region [G18.299B]   . Ascites [R18.8] 03/12/2016    Total Time spent with patient: 30 minutes  Subjective:   ALLAN MINOTTI is a 70 y.o. male patient admitted with "I was withdrawing from my Ativan".  HPI: Follow-up for Wednesday the 12th. 71 year old man with cirrhosis who had been maintained on chronic benzodiazepines. Came into the hospital in part because of withdrawal from benzodiazepines. There seems to of been a chronic acceptance of keeping him on Ativan which for a long time he had not been misusing. Although the patient does have a history of alcohol abuse I would tend to believe that he was probably not intentionally misusing his medicine so much as getting confused with that. I put him back on a milligram of Ativan 3 times a day. Currently not having any withdrawal symptoms. He is a little bit confused still but not delirious. He slept better last night. Mood still little bit down but no suicidality.  Social history: Patient lives with his son but the son works full time and is not around very much. Patient manages his own medicine.  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  Past  Psychiatric History: Patient says he's had chronic anxiety and depression. He reports trials of 2 antidepressants that he can remember including Effexor and Zoloft but thought that neither of them were well tolerated because they made him feel more anxious. Denies ever trying to kill himself or being in the hospital.  Risk to Self: Is patient at risk for suicide?: No Risk to Others:   Prior Inpatient Therapy:   Prior Outpatient Therapy:    Past Medical History:  Past Medical History:  Diagnosis Date  . Anxiety   . Anxiety   . Ascites   . Chronic hyponatremia   . Cirrhosis ([place])   . Depression   . DNR (do not resuscitate)   . GERD (gastroesophageal reflux disease)   . Hypertension   . Umbilical hernia     Past Surgical History:  Procedure Laterality Date  . CHOLECYSTECTOMY    . COLON SURGERY    . COLOSTOMY N/A   . THROAT SURGERY     Family History:  Family History  Problem Relation Age of Onset  . CAD Mother    Family Psychiatric  History: Not aware of any family history Social History:  History  Alcohol Use No     History  Drug Use No    Social History   Social History  . Marital status: Single    Spouse name: N/A  . Number of children: N/A  . Years of education: N/A   Social History Main Topics  . Smoking status: Former Research scientist (life sciences)  . Smokeless tobacco: Former Systems developer    Quit date: 07/04/2016  . Alcohol use No  .  Drug use: No  . Sexual activity: No   Other Topics Concern  . None   Social History Narrative  . None   Additional Social History:    Allergies:   Allergies  Allergen Reactions  . Dilantin [Phenytoin Sodium Extended] Rash  . Penicillins Rash and Other (See Comments)    Has patient had a PCN reaction causing immediate rash, facial/tongue/throat swelling, SOB or lightheadedness with hypotension: No Has patient had a PCN reaction causing severe rash involving mucus membranes or skin necrosis: No Has patient had a PCN reaction that required  hospitalization No Has patient had a PCN reaction occurring within the last 10 years: No If all of the above answers are "NO", then may proceed with Cephalosporin use.    Labs:  Results for orders placed or performed during the hospital encounter of 09/22/16 (from the past 48 hour(s))  Comprehensive metabolic panel     Status: Abnormal   Collection Time: 09/22/16  5:12 PM  Result Value Ref Range   Sodium 129 (L) 135 - 145 mmol/L   Potassium 2.8 (L) 3.5 - 5.1 mmol/L   Chloride 95 (L) 101 - 111 mmol/L   CO2 24 22 - 32 mmol/L   Glucose, Bld 93 65 - 99 mg/dL   BUN 11 6 - 20 mg/dL   Creatinine, Ser 1.42 (H) 0.61 - 1.24 mg/dL   Calcium 7.8 (L) 8.9 - 10.3 mg/dL   Total Protein 6.7 6.5 - 8.1 g/dL   Albumin 3.0 (L) 3.5 - 5.0 g/dL   AST 47 (H) 15 - 41 U/L   ALT 18 17 - 63 U/L   Alkaline Phosphatase 130 (H) 38 - 126 U/L   Total Bilirubin 1.8 (H) 0.3 - 1.2 mg/dL   GFR calc non Af Amer 49 (L) >60 mL/min   GFR calc Af Amer 57 (L) >60 mL/min    Comment: (NOTE) The eGFR has been calculated using the CKD EPI equation. This calculation has not been validated in all clinical situations. eGFR's persistently <60 mL/min signify possible Chronic Kidney Disease.    Anion gap 10 5 - 15  Ethanol     Status: None   Collection Time: 09/22/16  5:12 PM  Result Value Ref Range   Alcohol, Ethyl (B) <5 <5 mg/dL    Comment:        LOWEST DETECTABLE LIMIT FOR SERUM ALCOHOL IS 5 mg/dL FOR MEDICAL PURPOSES ONLY   Salicylate level     Status: None   Collection Time: 09/22/16  5:12 PM  Result Value Ref Range   Salicylate Lvl <1.4 2.8 - 30.0 mg/dL  Acetaminophen level     Status: Abnormal   Collection Time: 09/22/16  5:12 PM  Result Value Ref Range   Acetaminophen (Tylenol), Serum <10 (L) 10 - 30 ug/mL    Comment:        THERAPEUTIC CONCENTRATIONS VARY SIGNIFICANTLY. A RANGE OF 10-30 ug/mL MAY BE AN EFFECTIVE CONCENTRATION FOR MANY PATIENTS. HOWEVER, SOME ARE BEST TREATED AT CONCENTRATIONS OUTSIDE  THIS RANGE. ACETAMINOPHEN CONCENTRATIONS >150 ug/mL AT 4 HOURS AFTER INGESTION AND >50 ug/mL AT 12 HOURS AFTER INGESTION ARE OFTEN ASSOCIATED WITH TOXIC REACTIONS.   cbc     Status: Abnormal   Collection Time: 09/22/16  5:12 PM  Result Value Ref Range   WBC 10.0 3.8 - 10.6 K/uL   RBC 4.19 (L) 4.40 - 5.90 MIL/uL   Hemoglobin 10.9 (L) 13.0 - 18.0 g/dL   HCT 32.7 (L) 40.0 - 52.0 %  MCV 78.0 (L) 80.0 - 100.0 fL   MCH 26.1 26.0 - 34.0 pg   MCHC 33.5 32.0 - 36.0 g/dL   RDW 17.0 (H) 11.5 - 14.5 %   Platelets 197 150 - 440 K/uL  Magnesium     Status: Abnormal   Collection Time: 09/22/16  5:12 PM  Result Value Ref Range   Magnesium 1.6 (L) 1.7 - 2.4 mg/dL  Phosphorus     Status: None   Collection Time: 09/22/16  5:12 PM  Result Value Ref Range   Phosphorus 3.3 2.5 - 4.6 mg/dL  Urine Drug Screen, Qualitative     Status: Abnormal   Collection Time: 09/22/16  5:50 PM  Result Value Ref Range   Tricyclic, Ur Screen NONE DETECTED NONE DETECTED   Amphetamines, Ur Screen NONE DETECTED NONE DETECTED   MDMA (Ecstasy)Ur Screen NONE DETECTED NONE DETECTED   Cocaine Metabolite,Ur [place] NONE DETECTED NONE DETECTED   Opiate, Ur Screen NONE DETECTED NONE DETECTED   Phencyclidine (PCP) Ur S NONE DETECTED NONE DETECTED   Cannabinoid 50 Ng, Ur [place] NONE DETECTED NONE DETECTED   Barbiturates, Ur Screen NONE DETECTED NONE DETECTED   Benzodiazepine, Ur Scrn POSITIVE (A) NONE DETECTED   Methadone Scn, Ur NONE DETECTED NONE DETECTED    Comment: (NOTE) 716  Tricyclics, urine               Cutoff 1000 ng/mL 200  Amphetamines, urine             Cutoff 1000 ng/mL 300  MDMA (Ecstasy), urine           Cutoff 500 ng/mL 400  Cocaine Metabolite, urine       Cutoff 300 ng/mL 500  Opiate, urine                   Cutoff 300 ng/mL 600  Phencyclidine (PCP), urine      Cutoff 25 ng/mL 700  Cannabinoid, urine              Cutoff 50 ng/mL 800  Barbiturates, urine             Cutoff 200 ng/mL 900  Benzodiazepine, urine            Cutoff 200 ng/mL 1000 Methadone, urine                Cutoff 300 ng/mL 1100 1200 The urine drug screen provides only a preliminary, unconfirmed 1300 analytical test result and should not be used for non-medical 1400 purposes. Clinical consideration and professional judgment should 1500 be applied to any positive drug screen result due to possible 1600 interfering substances. A more specific alternate chemical method 1700 must be used in order to obtain a confirmed analytical result.  1800 Gas chromato graphy / mass spectrometry (GC/MS) is the preferred 1900 confirmatory method.   Ammonia     Status: None   Collection Time: 09/22/16  6:11 PM  Result Value Ref Range   Ammonia 26 9 - 35 umol/L  MRSA PCR Screening     Status: Abnormal   Collection Time: 09/22/16 11:28 PM  Result Value Ref Range   MRSA by PCR POSITIVE (A) NEGATIVE    Comment:        The GeneXpert MRSA Assay (FDA approved for NASAL specimens only), is one component of a comprehensive MRSA colonization surveillance program. It is not intended to diagnose MRSA infection nor to guide or monitor treatment for MRSA infections. RESULT CALLED TO, READ BACK  BY AND VERIFIED WITH: OLIVIA BOATNER ON 09/22/16 AT 0124 BY TLB   Aerobic Culture (superficial specimen)     Status: None (Preliminary result)   Collection Time: 09/22/16 11:31 PM  Result Value Ref Range   Specimen Description ABDOMEN    Special Requests NONE    Gram Stain      NO WBC SEEN ABUNDANT GRAM NEGATIVE RODS ABUNDANT GRAM POSITIVE COCCI IN PAIRS IN CLUSTERS    Culture      CULTURE REINCUBATED FOR BETTER GROWTH Performed at [facility]    Report Status PENDING   Basic metabolic panel     Status: Abnormal   Collection Time: 09/23/16  4:33 AM  Result Value Ref Range   Sodium 135 135 - 145 mmol/L   Potassium 3.3 (L) 3.5 - 5.1 mmol/L   Chloride 102 101 - 111 mmol/L   CO2 24 22 - 32 mmol/L   Glucose, Bld 94 65 - 99 mg/dL   BUN 11 6 -  20 mg/dL   Creatinine, Ser 1.20 0.61 - 1.24 mg/dL   Calcium 7.6 (L) 8.9 - 10.3 mg/dL   GFR calc non Af Amer 60 (L) >60 mL/min   GFR calc Af Amer >60 >60 mL/min    Comment: (NOTE) The eGFR has been calculated using the CKD EPI equation. This calculation has not been validated in all clinical situations. eGFR's persistently <60 mL/min signify possible Chronic Kidney Disease.    Anion gap 9 5 - 15  CBC     Status: Abnormal   Collection Time: 09/23/16  4:33 AM  Result Value Ref Range   WBC 3.5 (L) 3.8 - 10.6 K/uL   RBC 3.66 (L) 4.40 - 5.90 MIL/uL   Hemoglobin 9.5 (L) 13.0 - 18.0 g/dL   HCT 28.6 (L) 40.0 - 52.0 %   MCV 78.2 (L) 80.0 - 100.0 fL   MCH 25.9 (L) 26.0 - 34.0 pg   MCHC 33.2 32.0 - 36.0 g/dL   RDW 17.0 (H) 11.5 - 14.5 %   Platelets 117 (L) 150 - 440 K/uL  Protime-INR     Status: Abnormal   Collection Time: 09/23/16  4:33 AM  Result Value Ref Range   Prothrombin Time 16.5 (H) 11.4 - 15.2 seconds   INR 1.32   CBC     Status: Abnormal   Collection Time: 09/24/16  4:40 AM  Result Value Ref Range   WBC 4.2 3.8 - 10.6 K/uL   RBC 3.73 (L) 4.40 - 5.90 MIL/uL   Hemoglobin 9.8 (L) 13.0 - 18.0 g/dL   HCT 29.6 (L) 40.0 - 52.0 %   MCV 79.3 (L) 80.0 - 100.0 fL   MCH 26.3 26.0 - 34.0 pg   MCHC 33.1 32.0 - 36.0 g/dL   RDW 17.3 (H) 11.5 - 14.5 %   Platelets 118 (L) 150 - 440 K/uL  Magnesium     Status: None   Collection Time: 09/24/16  4:40 AM  Result Value Ref Range   Magnesium 2.3 1.7 - 2.4 mg/dL  Basic metabolic panel     Status: Abnormal   Collection Time: 09/24/16  4:40 AM  Result Value Ref Range   Sodium 137 135 - 145 mmol/L   Potassium 4.0 3.5 - 5.1 mmol/L   Chloride 103 101 - 111 mmol/L   CO2 30 22 - 32 mmol/L   Glucose, Bld 111 (H) 65 - 99 mg/dL   BUN 16 6 - 20 mg/dL   Creatinine, Ser 1.43 (H) 0.61 -  1.24 mg/dL   Calcium 7.6 (L) 8.9 - 10.3 mg/dL   GFR calc non Af Amer 48 (L) >60 mL/min   GFR calc Af Amer 56 (L) >60 mL/min    Comment: (NOTE) The eGFR has been  calculated using the CKD EPI equation. This calculation has not been validated in all clinical situations. eGFR's persistently <60 mL/min signify possible Chronic Kidney Disease.    Anion gap 4 (L) 5 - 15  Albumin, pleural or peritoneal fluid     Status: None   Collection Time: 09/24/16 11:26 AM  Result Value Ref Range   Albumin, Fluid <1.0 g/dL    Comment: (NOTE) No normal range established for this test Results should be evaluated in conjunction with serum values    Fluid Type-FALB PERITONEAL     Comment: CORRECTED ON 11/15 AT 1144: PREVIOUSLY REPORTED AS CYTOPERI  Lactate dehydrogenase (CSF, pleural or peritoneal fluid)     Status: Abnormal   Collection Time: 09/24/16 11:26 AM  Result Value Ref Range   LD, Fluid 36 (H) 3 - 23 U/L    Comment: (NOTE) Results should be evaluated in conjunction with serum values    Fluid Type-FLDH CYTOPERI   Protein, pleural or peritoneal fluid     Status: None   Collection Time: 09/24/16 11:26 AM  Result Value Ref Range   Total protein, fluid <3.0 g/dL    Comment: (NOTE) No normal range established for this test Results should be evaluated in conjunction with serum values    Fluid Type-FTP CYTOPERI   Body fluid cell count with differential     Status: Abnormal   Collection Time: 09/24/16 11:26 AM  Result Value Ref Range   Fluid Type-FCT CYTOPERI    Color, Fluid YELLOW YELLOW   Appearance, Fluid CLOUDY (A) CLEAR   WBC, Fluid 154 cu mm   Neutrophil Count, Fluid 22 %   Lymphs, Fluid 38 %   Monocyte-Macrophage-Serous Fluid 40 %   Eos, Fluid 0 %   Other Cells, Fluid 0 %  Glucose, pleural or peritoneal fluid     Status: None   Collection Time: 09/24/16 11:26 AM  Result Value Ref Range   Glucose, Fluid 130 mg/dL    Comment: (NOTE) No normal range established for this test Results should be evaluated in conjunction with serum values    Fluid Type-FGLU CYTOPERI     Current Facility-Administered Medications  Medication Dose Route  Frequency Provider Last Rate Last Dose  . bisacodyl (DULCOLAX) EC tablet 5 mg  5 mg Oral Daily PRN Alexis Hugelmeyer, DO   5 mg at 09/24/16 0953  . Chlorhexidine Gluconate Cloth 2 % PADS 6 each  6 each Topical Q0600 Alexis Hugelmeyer, DO   6 each at 09/23/16 0600  . feeding supplement (ENSURE ENLIVE) (ENSURE ENLIVE) liquid 237 mL  237 mL Oral BID BM Epifanio Lesches, MD   237 mL at 09/24/16 1314  . furosemide (LASIX) tablet 40 mg  40 mg Oral Daily Alexis Hugelmeyer, DO   40 mg at 09/24/16 0953  . gabapentin (NEURONTIN) capsule 100 mg  100 mg Oral BID AC Alexis Hugelmeyer, DO   100 mg at 09/24/16 0844  . gabapentin (NEURONTIN) capsule 100 mg  100 mg Oral QHS Alexis Hugelmeyer, DO   100 mg at 09/23/16 2301  . lactulose (CHRONULAC) 10 GM/15ML solution 10 g  10 g Oral Daily Alexis Hugelmeyer, DO   10 g at 09/24/16 0953  . LORazepam (ATIVAN) tablet 1 mg  1 mg Oral  TID Gonzella Lex, MD   1 mg at 09/24/16 0953  . magnesium citrate solution 1 Bottle  1 Bottle Oral Once PRN Alexis Hugelmeyer, DO      . mirtazapine (REMERON) tablet 15 mg  15 mg Oral QHS Gonzella Lex, MD      . multivitamin with minerals tablet 1 tablet  1 tablet Oral Daily Epifanio Lesches, MD   1 tablet at 09/24/16 0953  . mupirocin ointment (BACTROBAN) 2 % 1 application  1 application Nasal BID Alexis Hugelmeyer, DO   1 application at 50/56/97 0943  . nadolol (CORGARD) tablet 20 mg  20 mg Oral Daily Alexis Hugelmeyer, DO   20 mg at 09/24/16 0953  . ondansetron (ZOFRAN) tablet 4 mg  4 mg Oral Q6H PRN Alexis Hugelmeyer, DO       Or  . ondansetron (ZOFRAN) injection 4 mg  4 mg Intravenous Q6H PRN Alexis Hugelmeyer, DO      . pantoprazole (PROTONIX) EC tablet 40 mg  40 mg Oral BID Epifanio Lesches, MD   40 mg at 09/24/16 0953  . senna-docusate (Senokot-S) tablet 1 tablet  1 tablet Oral QHS PRN Alexis Hugelmeyer, DO      . simethicone (MYLICON) chewable tablet 160-240 mg  160-240 mg Oral Q6H PRN Alexis Hugelmeyer, DO      .  sodium chloride flush (NS) 0.9 % injection 3 mL  3 mL Intravenous Q12H Alexis Hugelmeyer, DO   3 mL at 09/24/16 1000  . sodium chloride tablet 1 g  1 g Oral Daily Alexis Hugelmeyer, DO   1 g at 09/24/16 0953  . spironolactone (ALDACTONE) tablet 50 mg  50 mg Oral Daily Alexis Hugelmeyer, DO   50 mg at 09/24/16 0953  . traMADol (ULTRAM) tablet 50 mg  50 mg Oral Q6H PRN Alexis Hugelmeyer, DO   50 mg at 09/23/16 2311    Musculoskeletal: Strength & Muscle Tone: decreased Gait & Station: unable to stand Patient leans: N/A  Psychiatric Specialty Exam: Physical Exam  Nursing note and vitals reviewed. Constitutional: He appears well-developed and well-nourished.  HENT:  Head: Normocephalic and atraumatic.  Eyes: Conjunctivae are normal. Pupils are equal, round, and reactive to light.  Neck: Normal range of motion.  Cardiovascular: Normal heart sounds.   Respiratory: Effort normal.  GI: Soft. He exhibits distension.  Musculoskeletal: Normal range of motion.  Neurological: He is alert.  Skin: Skin is warm and dry.  Psychiatric: His affect is blunt. His speech is delayed. He is slowed. He expresses no homicidal and no suicidal ideation. He exhibits abnormal recent memory.    Review of Systems  Constitutional: Negative.   HENT: Negative.   Eyes: Negative.   Respiratory: Negative.   Cardiovascular: Negative.   Gastrointestinal: Negative.   Musculoskeletal: Negative.   Skin: Negative.   Neurological: Negative.   Psychiatric/Behavioral: Positive for depression, hallucinations, memory loss, substance abuse and suicidal ideas. The patient is nervous/anxious and has insomnia.     Blood pressure 112/60, pulse 72, temperature 98.3 F (36.8 C), temperature source Oral, resp. rate 18, height _0  (1.676 m), weight 99.8 kg (220 lb), SpO2 100 %.Body mass index is 35.51 kg/m.  General Appearance: Disheveled  Eye Contact:  Fair  Speech:  Slow  Volume:  Decreased  Mood:  Dysphoric  Affect:   Constricted  Thought Process:  Disorganized  Orientation:  Full (Time, Place, and Person)  Thought Content:  Tangential  Suicidal Thoughts:  Yes.  without intent/plan  Homicidal Thoughts:  No  Memory:  Immediate;   Fair Recent;   Poor Remote;   Fair  Judgement:  Fair  Insight:  Fair  Psychomotor Activity:  Decreased  Concentration:  Concentration: Fair  Recall:  [employer] of Knowledge:  Fair  Language:  Fair  Akathisia:  No  Handed:  Right  AIMS (if indicated):     Assets:  Communication Skills Desire for Improvement Housing Social Support  ADL's:  Impaired  Cognition:  Impaired,  Mild  Sleep:        Treatment Plan Summary: Daily contact with patient to assess and evaluate symptoms and progress in treatment, Medication management and Plan Increased mirtazapine to 15 mg at night. Should be tolerable with his degree of liver impairment. Patient seems to of tolerated it without clear increase in his anxiety. Repeated to him that I think he needs to probably get more specific treatment for his anxiety and depression. Also we reviewed the dangers of his benzodiazepines. Patient really should have somebody else helping him or monitoring his medication to avoid overdose. Otherwise no further change to medicine. He will continue to follow-up with his usual providers.  Disposition: Patient does not meet criteria for psychiatric inpatient admission. Supportive therapy provided about ongoing stressors.  Alethia Berthold, MD 09/24/2016 2:57 PM

## 2016-09-24 NOTE — Progress Notes (Signed)
Discharge   Pt was able to participate with discharge teaching. Pt is unsure if he will be able to obtain medications tonight, but will find out from his family. PIV removed and pt waiting for wheelchair for discharge.

## 2016-09-25 LAB — MISC LABCORP TEST (SEND OUT): Labcorp test code: 19588

## 2016-09-25 LAB — PH, BODY FLUID: PH, BODY FLUID: 8

## 2016-09-26 LAB — PATHOLOGIST SMEAR REVIEW

## 2016-09-28 LAB — BODY FLUID CULTURE: Culture: NO GROWTH

## 2016-09-28 LAB — AEROBIC CULTURE W GRAM STAIN (SUPERFICIAL SPECIMEN)

## 2016-09-28 LAB — AEROBIC CULTURE  (SUPERFICIAL SPECIMEN): GRAM STAIN: NONE SEEN

## 2016-09-30 NOTE — Discharge Summary (Addendum)
Jeff Preston, is a 70 y.o. male  DOB Dec 17, 1945  MRN [id number].  Admission date:  09/22/2016  Admitting Physician  Tonye Royalty, DO  Discharge Date:  09/30/2016   Primary MD  Lynnae Prude, MD  Recommendations for primary care physician for things to follow:   Follow-up with primary doctor in 1 week   Admission Diagnosis  Hallucinations, visual [R44.1] Withdrawal from benzodiazepine, with delirium (HCC) [F13.231]   Discharge Diagnosis  Hallucinations, visual [R44.1] Withdrawal from benzodiazepine, with delirium (HCC) [F13.231]    Principal Problem:   Benzodiazepine withdrawal (HCC) Active Problems:   Depression      Past Medical History:  Diagnosis Date  . Anxiety   . Anxiety   . Ascites   . Chronic hyponatremia   . Cirrhosis (HCC)   . Depression   . DNR (do not resuscitate)   . GERD (gastroesophageal reflux disease)   . Hypertension   . Umbilical hernia     Past Surgical History:  Procedure Laterality Date  . CHOLECYSTECTOMY    . COLON SURGERY    . COLOSTOMY N/A   . THROAT SURGERY         History of present illness and  Hospital Course:     Kindly see H&P for history of present illness and admission details, please review complete Labs, Consult reports and Test reports for all details in brief  HPI  from the history and physical done on the day of admission 70 year old male patient with history of anxiety, depression, I'll call with liver cirrhosis, GERD, hypertension, chronic hyponatremia came because of hallucinations, altered mental status. Patient was given prescription of Ativan 1 milligrams 3 times a day, 90 tablets are prescribed around Halloween time, patient used all the medicine and  came  on November 13 because of altered mental status, hallucinations. When patient stopped  her taking the Ativan for almost 1 week. Brought in by the family, admitted for benzowithdrwal symptoms. ativan dose has been increased from 0.5 mg 3 times a day to 1mg  TID . Her into the pharmacy patient filled the prescriptions on Halloween day and he finished everything on November 13 before November 13.    Hospital Course    Altered mental status, but due to the withdrawal symptoms: he  ran out of Ativan, Ambien for 1 week ,came in because of hallucinations, admitted for Ativan withdrawal symptoms. Patient is admitted to medical service, started on Klonopin, seen by psychiatry. Started on Neurontin, low-dose Ativan. Stopped Klonopin.. Also started on Remeron at 7.5 mg and then increase to 15mg  at night by  psychiatry. Advised the patient about risks of misusing the Ativan. Advised the patient to be complaint with medications, low-dose Ativan back to 0.5 mg 3 times a day t.told he patient not to use 1 mg 3 times a day and has to be really careful  about the Ativan taking extra pills. Patient says that he took them by mistake. . Use them at discharge is stable. Patient did not require any inpatient psychiatric hospitalization. Change Ativan dose at my discharge instructions and patient is told to take Ativan 0.5 g 3 times a day, instead  Of 1 mg 3 times a day. And he is aware of this.  #2 history of alcohol liver cirrhosis, ascites, he gets  therapeutic paracentesis every month. This time paracentesis is done before discharging the patient, 3 L of fluid drained, which we gave him Albumin  after the paracentesis.  #3 severe malnutrition and the  contest of chronic illness: Seen by dietitian, started on ensure enlive,  3.r LIVER cirrhosis, ascites: He is on Aldactone, Lasix]   4. Peritoneal fluid culture results came after the patient is discharged, Fluid culture results showed Proteus, back to faecalis sensitive to Cipro. I will call the Cipro antibiotic for the patient. For 7 days. Discharge  Condition: stable   Follow UP  Follow-up Information    [name], [name], MD Follow up in 2 week(s).   Specialty:  Gastroenterology Contact information: [phone] [facility] - GASTROENTEROLOGY [place] [address] [phone]             Discharge Instructions  and  Discharge Medications        Medication List    TAKE these medications   bisacodyl 5 MG EC tablet Commonly known as:  DULCOLAX Take 1 tablet (5 mg total) by mouth daily as needed for moderate constipation.   feeding supplement (ENSURE ENLIVE) Liqd Take 237 mLs by mouth 2 (two) times daily between meals.   furosemide 40 MG tablet Commonly known as:  LASIX Take 1 tablet (40 mg total) by mouth daily.   lactulose 10 GM/15ML solution Commonly known as:  CHRONULAC Take 15 mLs (10 g total) by mouth daily.   LORazepam 1 MG tablet Commonly known as:  ATIVAN Take 1 tablet (1 mg total) by mouth 3 (three) times daily.   mirtazapine 15 MG tablet Commonly known as:  REMERON Take 1 tablet (15 mg total) by mouth at bedtime.   nadolol 20 MG tablet Commonly known as:  CORGARD Take 20 mg by mouth daily.   omeprazole 20 MG capsule Commonly known as:  PRILOSEC Take 20 mg by mouth 2 (two) times daily.   potassium chloride SA 20 MEQ tablet Commonly known as:  K-DUR,KLOR-CON Take 20 mEq by mouth 2 (two) times daily.   simethicone 80 MG chewable tablet Commonly known as:  MYLICON Chew 160-240 mg by mouth every 6 (six) hours as needed for flatulence.   sodium chloride 1 g tablet Take 1 tablet (1 g total) by mouth daily.   spironolactone 50 MG tablet Commonly known as:  ALDACTONE Take 50 mg by mouth daily.   traMADol 50 MG tablet Commonly known as:  ULTRAM Take 1 tablet (50 mg total) by mouth every 6 (six) hours as needed for moderate pain or severe pain.         Diet and Activity recommendation: See Discharge Instructions above   Consults obtained -psychiatry   Major  procedures and Radiology Reports - PLEASE review detailed and final reports for all details, in brief -      Ct Head Wo Contrast  Result Date: 09/22/2016 CLINICAL DATA:  Anxiety and difficulty sleeping. Hallucinations at night. EXAM: CT HEAD WITHOUT CONTRAST TECHNIQUE: Contiguous axial images were obtained from the base of the skull through the vertex without intravenous contrast. COMPARISON:  04/18/1998 report FINDINGS: BRAIN: There is sulcal prominence with normal sized ventricles consistent with superficial atrophy. No intraparenchymal hemorrhage, mass effect nor midline shift. There are patchy periventricular and subcortical white matter hypodensities consistent with chronic small vessel ischemic disease. No acute large vascular territory infarcts. No abnormal extra-axial fluid collections. Basal cisterns are patent. VASCULAR: Mild calcific atherosclerosis of the carotid siphons. SKULL: No skull fracture. No significant scalp soft tissue swelling. SINUSES/ORBITS: The mastoid air-cells are clear. The included paranasal sinuses are well-aerated.The included ocular globes and orbital contents are non-suspicious. OTHER: None. IMPRESSION: Superficial atrophy with chronic appearing  small vessel ischemic disease of periventricular white matter. No acute intracranial abnormality. Electronically Signed   By: Tollie Ethavid  Kwon M.D.   On: 09/22/2016 19:54   [place] Paracentesis  Result Date: 09/24/2016 INDICATION: Cirrhosis and ascites. EXAM: ULTRASOUND GUIDED PARACENTESIS MEDICATIONS: None. COMPLICATIONS: None immediate. PROCEDURE: Informed written consent was obtained from the patient after a discussion of the risks, benefits and alternatives to treatment. A timeout was performed prior to the initiation of the procedure. A time-out was performed prior to initiating the procedure. Ultrasound was used to localize ascites. The right lateral abdomen was prepped with chlorhexidine and draped in the usual sterile fashion. 1%  lidocaine was used for local anesthesia. Following this, a 6 French Safe-T-Centesis catheter was introduced. An ultrasound image was saved for documentation purposes. Paracentesis was performed. The catheter was removed and a dressing was applied. The patient tolerated the procedure well without immediate post procedural complication. FINDINGS: A total of approximately 4.7 L of yellow fluid was removed. Samples were sent to the laboratory as requested. IMPRESSION: Successful ultrasound-guided paracentesis yielding 4.7 liters of peritoneal fluid. Electronically Signed   By: Irish LackGlenn  Yamagata M.D.   On: 09/24/2016 14:57   [place] Paracentesis  Result Date: 09/17/2016 INDICATION: Ascites EXAM: ULTRASOUND GUIDED PARACENTESIS MEDICATIONS: None. COMPLICATIONS: None immediate. PROCEDURE: Informed written consent was obtained from the patient after a discussion of the risks, benefits and alternatives to treatment. A timeout was performed prior to the initiation of the procedure. Initial ultrasound scanning demonstrates a large amount of ascites within the right mid abdomen. The right abdomen was prepped and draped in the usual sterile fashion. 1% lidocaine was used for local anesthesia. Following this, a 6 Fr Safe-T-Centesis catheter was introduced. An ultrasound image was saved for documentation purposes. The paracentesis was performed. The catheter was removed and a dressing was applied. The patient tolerated the procedure well without immediate post procedural complication. FINDINGS: A total of approximately 3.9 L of clear yellow fluid was removed. IMPRESSION: Successful ultrasound-guided paracentesis yielding 3.9 liters of peritoneal fluid. Electronically Signed   By: Alcide CleverMark  Lukens M.D.   On: 09/17/2016 13:00   [place] Paracentesis  Result Date: 09/10/2016 INDICATION: Ascites EXAM: ULTRASOUND GUIDED PARACENTESIS MEDICATIONS: None. COMPLICATIONS: None immediate. PROCEDURE: Informed written consent was obtained from the  patient after a discussion of the risks, benefits and alternatives to treatment. A timeout was performed prior to the initiation of the procedure. Initial ultrasound scanning demonstrates a moderate amount of ascites within the right lower abdominal quadrant. The right lower abdomen was prepped and draped in the usual sterile fashion. 1% lidocaine was used for local anesthesia. Following this, a 6 Fr Safe-T-Centesis catheter was introduced utilizing real-time ultrasound guidance. An ultrasound image was saved for documentation purposes. The paracentesis was performed. The catheter was removed and a dressing was applied. The patient tolerated the procedure well without immediate post procedural complication. FINDINGS: A total of approximately 2.75 L of yellow cloudy fluid was removed. IMPRESSION: Successful ultrasound-guided paracentesis yielding 2.75 L liters of peritoneal fluid. Electronically Signed   By: Alcide CleverMark  Lukens M.D.   On: 09/10/2016 13:14   [place] Paracentesis  Result Date: 09/03/2016 INDICATION: Alcoholic cirrhosis, recurrent abdominal ascites, abdominal distention and discomfort EXAM: ULTRASOUND GUIDED PARACENTESIS MEDICATIONS: 1% lidocaine locally COMPLICATIONS: None immediate. PROCEDURE: An ultrasound guided paracentesis was thoroughly discussed with the patient and questions answered. The benefits, risks, alternatives and complications were also discussed. The patient understands and wishes to proceed with the procedure. Written consent was obtained. Ultrasound was performed to  localize and mark an adequate pocket of fluid in the right lower quadrant of the abdomen. The area was then prepped and draped in the normal sterile fashion. 1% Lidocaine was used for local anesthesia. Under ultrasound guidance a 19 gauge Yueh catheter was introduced. Paracentesis was performed. The catheter was removed and a dressing applied. FINDINGS: A total of approximately 7.35 L of yellow exudate peritoneal fluid was  removed. A fluid sample was not sent for laboratory analysis. IMPRESSION: Successful ultrasound guided paracentesis yielding 7.35 L of ascites. Electronically Signed   By: Judie PetitM.  Shick M.D.   On: 09/03/2016 13:11    Micro Results    Recent Results (from the past 240 hour(s))  MRSA PCR Screening     Status: Abnormal   Collection Time: 09/22/16 11:28 PM  Result Value Ref Range Status   MRSA by PCR POSITIVE (A) NEGATIVE Final    Comment:        The GeneXpert MRSA Assay (FDA approved for NASAL specimens only), is one component of a comprehensive MRSA colonization surveillance program. It is not intended to diagnose MRSA infection nor to guide or monitor treatment for MRSA infections. RESULT CALLED TO, READ BACK BY AND VERIFIED WITH: OLIVIA BOATNER ON 09/22/16 AT 0124 BY TLB   Aerobic Culture (superficial specimen)     Status: None   Collection Time: 09/22/16 11:31 PM  Result Value Ref Range Status   Specimen Description ABDOMEN  Final   Special Requests NONE  Final   Gram Stain   Final    NO WBC SEEN ABUNDANT GRAM NEGATIVE RODS ABUNDANT GRAM POSITIVE COCCI IN PAIRS IN CLUSTERS [facility]    Culture   Final    MODERATE PROTEUS MIRABILIS MODERATE ENTEROCOCCUS FAECALIS    Report Status 09/28/2016 FINAL  Final   Organism ID, Bacteria PROTEUS MIRABILIS  Final   Organism ID, Bacteria ENTEROCOCCUS FAECALIS  Final      Susceptibility   Enterococcus faecalis - MIC*    AMPICILLIN <=2 SENSITIVE Sensitive     VANCOMYCIN 2 SENSITIVE Sensitive     GENTAMICIN SYNERGY SENSITIVE Sensitive     * MODERATE ENTEROCOCCUS FAECALIS   Proteus mirabilis - MIC*    AMPICILLIN <=2 SENSITIVE Sensitive     CEFAZOLIN <=4 SENSITIVE Sensitive     CEFEPIME <=1 SENSITIVE Sensitive     CEFTAZIDIME <=1 SENSITIVE Sensitive     CEFTRIAXONE <=1 SENSITIVE Sensitive     CIPROFLOXACIN <=0.25 SENSITIVE Sensitive     GENTAMICIN <=1 SENSITIVE Sensitive     IMIPENEM 4 SENSITIVE Sensitive      TRIMETH/SULFA <=20 SENSITIVE Sensitive     AMPICILLIN/SULBACTAM <=2 SENSITIVE Sensitive     PIP/TAZO <=4 SENSITIVE Sensitive     * MODERATE PROTEUS MIRABILIS  Body fluid culture     Status: None   Collection Time: 09/24/16 11:26 AM  Result Value Ref Range Status   Specimen Description PERITONEAL  Final   Special Requests NONE  Final   Gram Stain   Final    FEW WBC PRESENT, PREDOMINANTLY MONONUCLEAR NO ORGANISMS SEEN    Culture   Final    NO GROWTH 3 DAYS [facility]    Report Status 09/28/2016 FINAL  Final       Today   Subjective:   Jeff Preston today has no headache,no chest abdominal pain,no new weakness tingling or numbness, feels much better wants to go home today.  Objective:   Blood pressure 112/60, pulse 72, temperature  98.3 F (36.8 C), temperature source Oral, resp. rate 18, height 5\' 6"  (1.676 m), weight 99.8 kg (220 lb), SpO2 100 %.  No intake or output data in the 24 hours ending 09/30/16 1255  Exam Awake Alert, Oriented x 3, No new F.N deficits, Normal affect [place].AT,PERRAL Supple Neck,No JVD, No cervical lymphadenopathy appriciated.  Symmetrical Chest wall movement, Good air movement bilaterally, CTAB RRR,No Gallops,Rubs or new Murmurs, No Parasternal Heave +ve B.Sounds, Abd Soft, Non tender, No organomegaly appriciated, No rebound -guarding or rigidity. No Cyanosis, Clubbing or edema, No new Rash or bruise  Data Review   CBC w Diff:  Lab Results  Component Value Date   WBC 4.2 09/24/2016   HGB 9.8 (L) 09/24/2016   HGB 10.8 (L) 03/05/2015   HCT 29.6 (L) 09/24/2016   HCT 31.9 (L) 03/05/2015   PLT 118 (L) 09/24/2016   PLT 151 03/05/2015   LYMPHOPCT 11 07/03/2016   LYMPHOPCT 11.8 03/05/2015   MONOPCT 12 07/03/2016   MONOPCT 10.3 03/05/2015   EOSPCT 1 07/03/2016   EOSPCT 2.9 03/05/2015   BASOPCT 0 07/03/2016   BASOPCT 0.5 03/05/2015    CMP:  Lab Results  Component Value Date   NA 137 09/24/2016   NA 134 (L)  03/05/2015   K 4.0 09/24/2016   K 3.8 03/05/2015   CL 103 09/24/2016   CL 98 (L) 03/05/2015   CO2 30 09/24/2016   CO2 26 03/05/2015   BUN 16 09/24/2016   BUN 7 03/05/2015   CREATININE 1.43 (H) 09/24/2016   CREATININE 1.04 03/05/2015   PROT 6.7 09/22/2016   PROT 7.4 03/05/2015   ALBUMIN 3.0 (L) 09/22/2016   ALBUMIN 3.0 (L) 03/05/2015   BILITOT 1.8 (H) 09/22/2016   BILITOT 1.9 (H) 03/05/2015   ALKPHOS 130 (H) 09/22/2016   ALKPHOS 108 03/05/2015   AST 47 (H) 09/22/2016   AST 61 (H) 03/05/2015   ALT 18 09/22/2016   ALT 22 03/05/2015  .   Total Time in preparing paper work, data evaluation and todays exam - 35 minutes  Sadia Belfiore M.D on 09/30/2016 at 12:55 PM    Note: This dictation was prepared with Dragon dictation along with smaller phrase technology. Any transcriptional errors that result from this process are unintentional.

## 2016-10-01 ENCOUNTER — Ambulatory Visit: Payer: Medicare Other

## 2016-10-01 NOTE — Discharge Instructions (Signed)
Paracentesis, Care After °Introduction °Refer to this sheet in the next few weeks. These instructions provide you with information about caring for yourself after your procedure. Your health care provider may also give you more specific instructions. Your treatment has been planned according to current medical practices, but problems sometimes occur. Call your health care provider if you have any problems or questions after your procedure. °What can I expect after the procedure? °After your procedure, it is common to have a small amount of clear fluid coming from the puncture site. °Follow these instructions at home: °· Return to your normal activities as told by your health care provider. Ask your health care provider what activities are safe for you. °· Take over-the-counter and prescription medicines only as told by your health care provider. °· Do not take baths, swim, or use a hot tub until your health care provider approves. °· Follow instructions from your health care provider about: °¨ How to take care of your puncture site. °¨ When and how you should change your bandage (dressing). °¨ When you should remove your dressing. °· Check your puncture area every day signs of infection. Watch for: °¨ Redness, swelling, or pain. °¨ Fluid, blood, or pus. °· Keep all follow-up visits as told by your health care provider. This is important. °Contact a health care provider if: °· You have redness, swelling, or pain at your puncture site. °· You start to have more clear fluid coming from your puncture site. °· You have blood or pus coming from your puncture site. °· You have chills. °· You have a fever. °Get help right away if: °· You develop chest pain or shortness of breath. °· You develop increasing pain, discomfort, or swelling in your abdomen. °· You feel dizzy or light-headed or you pass out. °This information is not intended to replace advice given to you by your health care provider. Make sure you discuss any  questions you have with your health care provider. °Document Released: 03/13/2015 Document Revised: 04/03/2016 Document Reviewed: 01/09/2015 °© 2017 [employer]vier ° °

## 2016-10-03 ENCOUNTER — Ambulatory Visit
Admission: RE | Admit: 2016-10-03 | Discharge: 2016-10-03 | Disposition: A | Discharge status: Home / Self Care | Payer: Medicare Other | Source: Ambulatory Visit | Attending: Unknown Physician Specialty | Admitting: Unknown Physician Specialty

## 2016-10-03 DIAGNOSIS — K7031 Alcoholic cirrhosis of liver with ascites: Secondary | ICD-10-CM

## 2016-10-03 LAB — SODIUM: SODIUM: 137 mmol/L (ref 135–145)

## 2016-10-03 MED ORDER — ALBUMIN HUMAN 25 % IV SOLN
25.0000 g | Freq: Once | INTRAVENOUS | Status: AC
  Administered 2016-10-03: 25 g via INTRAVENOUS
  Filled 2016-10-03: qty 100

## 2016-10-03 NOTE — Procedures (Signed)
Successful US-guided paracentesis.  Removed 4.4 liters of milky yellow fluid.  No immediate complication.  Minimal blood loss.

## 2016-10-03 NOTE — Sedation Documentation (Signed)
Dressing right abdomen dry and intact (paracentesis site)

## 2016-10-06 ENCOUNTER — Other Ambulatory Visit: Payer: Self-pay | Admitting: Unknown Physician Specialty

## 2016-10-06 DIAGNOSIS — K7031 Alcoholic cirrhosis of liver with ascites: Secondary | ICD-10-CM

## 2016-10-08 ENCOUNTER — Ambulatory Visit: Payer: Medicare Other

## 2016-10-10 ENCOUNTER — Ambulatory Visit
Admission: RE | Admit: 2016-10-10 | Discharge: 2016-10-10 | Disposition: A | Discharge status: Home / Self Care | Payer: Medicare Other | Source: Ambulatory Visit | Attending: Unknown Physician Specialty | Admitting: Unknown Physician Specialty

## 2016-10-10 DIAGNOSIS — K7031 Alcoholic cirrhosis of liver with ascites: Secondary | ICD-10-CM | POA: Insufficient documentation

## 2016-10-10 LAB — SODIUM: SODIUM: 135 mmol/L (ref 135–145)

## 2016-10-10 MED ORDER — ALBUMIN HUMAN 25 % IV SOLN
25.0000 g | Freq: Once | INTRAVENOUS | Status: AC
  Administered 2016-10-10: 25 g via INTRAVENOUS
  Filled 2016-10-10: qty 100

## 2016-10-10 NOTE — Procedures (Signed)
RLQ paracentesis Total amount pending No comp/EBL

## 2016-10-15 ENCOUNTER — Ambulatory Visit: Payer: Medicare Other

## 2016-10-16 MED ORDER — ALBUMIN HUMAN 25 % IV SOLN
25.0000 g | Freq: Once | INTRAVENOUS | Status: DC
  Filled 2016-10-16 (×2): qty 100

## 2016-10-16 MED ORDER — ALBUMIN HUMAN 25 % IV SOLN: 25.0000 g | Freq: Once | INTRAVENOUS | Status: DC

## 2016-10-16 NOTE — Discharge Instructions (Signed)
Paracentesis, Care After °Introduction °Refer to this sheet in the next few weeks. These instructions provide you with information about caring for yourself after your procedure. Your health care provider may also give you more specific instructions. Your treatment has been planned according to current medical practices, but problems sometimes occur. Call your health care provider if you have any problems or questions after your procedure. °What can I expect after the procedure? °After your procedure, it is common to have a small amount of clear fluid coming from the puncture site. °Follow these instructions at home: °· Return to your normal activities as told by your health care provider. Ask your health care provider what activities are safe for you. °· Take over-the-counter and prescription medicines only as told by your health care provider. °· Do not take baths, swim, or use a hot tub until your health care provider approves. °· Follow instructions from your health care provider about: °¨ How to take care of your puncture site. °¨ When and how you should change your bandage (dressing). °¨ When you should remove your dressing. °· Check your puncture area every day signs of infection. Watch for: °¨ Redness, swelling, or pain. °¨ Fluid, blood, or pus. °· Keep all follow-up visits as told by your health care provider. This is important. °Contact a health care provider if: °· You have redness, swelling, or pain at your puncture site. °· You start to have more clear fluid coming from your puncture site. °· You have blood or pus coming from your puncture site. °· You have chills. °· You have a fever. °Get help right away if: °· You develop chest pain or shortness of breath. °· You develop increasing pain, discomfort, or swelling in your abdomen. °· You feel dizzy or light-headed or you pass out. °This information is not intended to replace advice given to you by your health care provider. Make sure you discuss any  questions you have with your health care provider. °Document Released: 03/13/2015 Document Revised: 04/03/2016 Document Reviewed: 01/09/2015 °© 2017 [employer]vier ° °

## 2016-10-17 ENCOUNTER — Ambulatory Visit
Admission: RE | Admit: 2016-10-17 | Discharge: 2016-10-17 | Disposition: A | Discharge status: Home / Self Care | Payer: Medicare Other | Source: Ambulatory Visit | Attending: Unknown Physician Specialty | Admitting: Unknown Physician Specialty

## 2016-10-17 DIAGNOSIS — K7031 Alcoholic cirrhosis of liver with ascites: Secondary | ICD-10-CM | POA: Diagnosis not present

## 2016-10-17 LAB — SODIUM: Sodium: 138 mmol/L (ref 135–145)

## 2016-10-22 ENCOUNTER — Ambulatory Visit: Payer: Medicare Other

## 2016-10-24 ENCOUNTER — Ambulatory Visit
Admission: RE | Admit: 2016-10-24 | Discharge: 2016-10-24 | Disposition: A | Discharge status: Home / Self Care | Payer: Medicare Other | Source: Ambulatory Visit | Attending: Unknown Physician Specialty | Admitting: Unknown Physician Specialty

## 2016-10-24 DIAGNOSIS — K7031 Alcoholic cirrhosis of liver with ascites: Secondary | ICD-10-CM | POA: Diagnosis not present

## 2016-10-24 LAB — SODIUM: Sodium: 130 mmol/L — ABNORMAL LOW (ref 135–145)

## 2016-10-24 MED ORDER — ONDANSETRON HCL 4 MG/2ML IJ SOLN
4.0000 mg | Freq: Once | INTRAMUSCULAR | Status: AC
  Administered 2016-10-24: 4 mg via INTRAVENOUS
  Filled 2016-10-24: qty 2

## 2016-10-24 MED ORDER — ALBUMIN HUMAN 25 % IV SOLN
25.0000 g | Freq: Once | INTRAVENOUS | Status: AC
  Administered 2016-10-24: 25 g via INTRAVENOUS
  Filled 2016-10-24 (×2): qty 100

## 2016-10-24 MED ORDER — LORAZEPAM 2 MG/ML IJ SOLN
0.5000 mg | Freq: Once | INTRAMUSCULAR | Status: AC
  Administered 2016-10-24: 0.5 mg via INTRAVENOUS
  Filled 2016-10-24 (×2): qty 1

## 2016-10-24 NOTE — Procedures (Signed)
Successful US guided paracentesis.  8.25 liters of milky yellow fluid removed.  In addition, patient was given 0.5 mg Ativan and 4 mg Zofran during the procedure due to nausea.  No immediate complication.

## 2016-10-25 ENCOUNTER — Encounter: Payer: Self-pay | Admitting: Emergency Medicine

## 2016-10-25 ENCOUNTER — Emergency Department: Payer: Medicare Other

## 2016-10-25 ENCOUNTER — Emergency Department
Admission: EM | Admit: 2016-10-25 | Discharge: 2016-10-25 | Disposition: A | Discharge status: Home / Self Care | Payer: Medicare Other | Attending: Student in an Organized Health Care Education/Training Program | Admitting: Student in an Organized Health Care Education/Training Program

## 2016-10-25 DIAGNOSIS — I10 Essential (primary) hypertension: Secondary | ICD-10-CM | POA: Insufficient documentation

## 2016-10-25 DIAGNOSIS — Z79899 Other long term (current) drug therapy: Secondary | ICD-10-CM | POA: Insufficient documentation

## 2016-10-25 DIAGNOSIS — K7460 Unspecified cirrhosis of liver: Secondary | ICD-10-CM | POA: Diagnosis not present

## 2016-10-25 DIAGNOSIS — F419 Anxiety disorder, unspecified: Secondary | ICD-10-CM | POA: Diagnosis not present

## 2016-10-25 DIAGNOSIS — Z87891 Personal history of nicotine dependence: Secondary | ICD-10-CM | POA: Insufficient documentation

## 2016-10-25 DIAGNOSIS — F1310 Sedative, hypnotic or anxiolytic abuse, uncomplicated: Secondary | ICD-10-CM

## 2016-10-25 DIAGNOSIS — R1909 Other intra-abdominal and pelvic swelling, mass and lump: Secondary | ICD-10-CM | POA: Diagnosis present

## 2016-10-25 DIAGNOSIS — F1320 Sedative, hypnotic or anxiolytic dependence, uncomplicated: Secondary | ICD-10-CM | POA: Insufficient documentation

## 2016-10-25 DIAGNOSIS — R188 Other ascites: Secondary | ICD-10-CM

## 2016-10-25 DIAGNOSIS — N5089 Other specified disorders of the male genital organs: Secondary | ICD-10-CM

## 2016-10-25 LAB — COMPREHENSIVE METABOLIC PANEL
ALK PHOS: 134 U/L — AB (ref 38–126)
ALT: 18 U/L (ref 17–63)
ANION GAP: 5 (ref 5–15)
AST: 48 U/L — ABNORMAL HIGH (ref 15–41)
Albumin: 3.1 g/dL — ABNORMAL LOW (ref 3.5–5.0)
BILIRUBIN TOTAL: 1.3 mg/dL — AB (ref 0.3–1.2)
BUN: 13 mg/dL (ref 6–20)
CALCIUM: 8 mg/dL — AB (ref 8.9–10.3)
CO2: 25 mmol/L (ref 22–32)
Chloride: 103 mmol/L (ref 101–111)
Creatinine, Ser: 1.35 mg/dL — ABNORMAL HIGH (ref 0.61–1.24)
GFR calc non Af Amer: 52 mL/min — ABNORMAL LOW (ref >60)
Glucose, Bld: 129 mg/dL — ABNORMAL HIGH (ref 65–99)
POTASSIUM: 3.7 mmol/L (ref 3.5–5.1)
Sodium: 133 mmol/L — ABNORMAL LOW (ref 135–145)
TOTAL PROTEIN: 6.7 g/dL (ref 6.5–8.1)

## 2016-10-25 LAB — CBC WITH DIFFERENTIAL/PLATELET
BASOS PCT: 1 %
Basophils Absolute: 0 10*3/uL (ref 0–0.1)
EOS ABS: 0 10*3/uL (ref 0–0.7)
Eosinophils Relative: 1 %
HEMATOCRIT: 26.6 % — AB (ref 40.0–52.0)
HEMOGLOBIN: 8.8 g/dL — AB (ref 13.0–18.0)
LYMPHS ABS: 0.4 10*3/uL — AB (ref 1.0–3.6)
Lymphocytes Relative: 7 %
MCH: 25.5 pg — AB (ref 26.0–34.0)
MCHC: 33.1 g/dL (ref 32.0–36.0)
MCV: 76.9 fL — ABNORMAL LOW (ref 80.0–100.0)
MONO ABS: 0.6 10*3/uL (ref 0.2–1.0)
MONOS PCT: 10 %
NEUTROS ABS: 4.9 10*3/uL (ref 1.4–6.5)
NEUTROS PCT: 81 %
Platelets: 161 10*3/uL (ref 150–440)
RBC: 3.46 MIL/uL — ABNORMAL LOW (ref 4.40–5.90)
RDW: 18.2 % — AB (ref 11.5–14.5)
WBC: 6 10*3/uL (ref 3.8–10.6)

## 2016-10-25 LAB — AMMONIA: AMMONIA: 33 umol/L (ref 9–35)

## 2016-10-25 LAB — PROTIME-INR
INR: 1.17
PROTHROMBIN TIME: 15 s (ref 11.4–15.2)

## 2016-10-25 MED ORDER — CHLORDIAZEPOXIDE HCL 25 MG PO CAPS: 25.0000 mg | ORAL_CAPSULE | Freq: Three times a day (TID) | ORAL | 0 refills | Status: DC | PRN

## 2016-10-25 MED ORDER — CHLORDIAZEPOXIDE HCL 25 MG PO CAPS
50.0000 mg | ORAL_CAPSULE | Freq: Once | ORAL | Status: AC
  Administered 2016-10-25: 50 mg via ORAL
  Filled 2016-10-25: qty 2

## 2016-10-25 MED ORDER — DIAZEPAM 5 MG PO TABS
10.0000 mg | ORAL_TABLET | Freq: Once | ORAL | Status: AC
  Administered 2016-10-25: 10 mg via ORAL
  Filled 2016-10-25: qty 2

## 2016-10-25 NOTE — ED Notes (Signed)
Report from denia, rn. 

## 2016-10-25 NOTE — ED Provider Notes (Signed)
[facility] Emergency Department Provider Note    First MD Initiated Contact with Patient 10/25/16 1742     (approximate)  I have reviewed the triage vital signs and the nursing notes.   HISTORY  Chief Complaint Groin Swelling and Cough    HPI Jeff Preston is a 70 y.o. male with a history of cirrhosis as well as anxiety presents with scrotal swelling and fear that he is withdrawing from his clonazepam. Patient had paracentesis performed yesterday. Denies any fevers. Denies any worsening abdominal pain. States she looked down after using the bathroom and noted that his scrotum was leaking therefore he called EMS. Denies any worsening shortness of breath. States that his scrotum is not enlarged compared to previous but he never noted any fluid drainage. Denies any scrotal pain. Denies any worsening lower extremity swelling. No orthopnea.   Past Medical History:  Diagnosis Date  . Anxiety   . Anxiety   . Ascites   . Chronic hyponatremia   . Cirrhosis ([place])   . Depression   . DNR (do not resuscitate)   . GERD (gastroesophageal reflux disease)   . Hypertension   . Umbilical hernia    Family History  Problem Relation Age of Onset  . CAD Mother    Past Surgical History:  Procedure Laterality Date  . CHOLECYSTECTOMY    . COLON SURGERY    . COLOSTOMY N/A   . THROAT SURGERY     Patient Active Problem List   Diagnosis Date Noted  . Depression 09/23/2016  . Benzodiazepine withdrawal ([place]) 09/22/2016  . Palliative care by specialist   . DNR (do not resuscitate)   . Hepatic cirrhosis ([place]) 04/28/2016  . Hyponatremia 04/26/2016  . Umbilical hernia with gangrene   . Open wound of umbilical region   . Ascites 03/12/2016      Prior to Admission medications   Medication Sig Start Date End Date Taking? Authorizing Provider  bisacodyl (DULCOLAX) 5 MG EC tablet Take 1 tablet (5 mg total) by mouth daily as needed for moderate constipation. 03/16/16    Nicholes Mango, MD  feeding supplement, ENSURE ENLIVE, (ENSURE ENLIVE) LIQD Take 237 mLs by mouth 2 (two) times daily between meals. 09/24/16   Epifanio Lesches, MD  furosemide (LASIX) 40 MG tablet Take 1 tablet (40 mg total) by mouth daily. 05/01/16   Srikar Sudini, MD  lactulose (CHRONULAC) 10 GM/15ML solution Take 15 mLs (10 g total) by mouth daily. 09/06/16   Carrie Mew, MD  LORazepam (ATIVAN) 1 MG tablet Take 1 tablet (1 mg total) by mouth 3 (three) times daily. 09/24/16   Epifanio Lesches, MD  mirtazapine (REMERON) 15 MG tablet Take 1 tablet (15 mg total) by mouth at bedtime. 09/24/16   Epifanio Lesches, MD  nadolol (CORGARD) 20 MG tablet Take 20 mg by mouth daily.    Historical Provider, MD  omeprazole (PRILOSEC) 20 MG capsule Take 20 mg by mouth 2 (two) times daily.     Historical Provider, MD  potassium chloride SA (K-DUR,KLOR-CON) 20 MEQ tablet Take 20 mEq by mouth 2 (two) times daily. 05/28/16   Historical Provider, MD  simethicone (MYLICON) 80 MG chewable tablet Chew 160-240 mg by mouth every 6 (six) hours as needed for flatulence.     Historical Provider, MD  sodium chloride 1 g tablet Take 1 tablet (1 g total) by mouth daily. 05/01/16   Hillary Bow, MD  spironolactone (ALDACTONE) 50 MG tablet Take 50 mg by mouth daily. 04/14/16  Historical Provider, MD  traMADol (ULTRAM) 50 MG tablet Take 1 tablet (50 mg total) by mouth every 6 (six) hours as needed for moderate pain or severe pain. 05/01/16   Hillary Bow, MD    Allergies Dilantin [phenytoin sodium extended] and Penicillins    Social History Social History  Substance Use Topics  . Smoking status: Former Research scientist (life sciences)  . Smokeless tobacco: Former Systems developer    Quit date: 07/04/2016  . Alcohol use No    Review of Systems Patient denies headaches, rhinorrhea, blurry vision, numbness, shortness of breath, chest pain, edema, cough, abdominal pain, nausea, vomiting, diarrhea, dysuria, fevers, rashes or hallucinations unless  otherwise stated above in HPI. ____________________________________________   PHYSICAL EXAM:  VITAL SIGNS: Vitals:   10/25/16 1803  BP: (!) 158/92  Pulse: 96  Resp: 20  Temp: 98.5 F (36.9 C)    Constitutional: Alert and oriented.  Eyes: Conjunctivae are normal. PERRL. EOMI. Head: Atraumatic. Nose: No congestion/rhinnorhea. Mouth/Throat: Mucous membranes are moist.  Oropharynx non-erythematous. Neck: No stridor. Painless ROM. No cervical spine tenderness to palpation Hematological/Lymphatic/Immunilogical: No cervical lymphadenopathy. Cardiovascular: Normal rate, regular rhythm. Grossly normal heart sounds.  Good peripheral circulation. Respiratory: Normal respiratory effort.  No retractions. Lungs CTAB. Gastrointestinal: Soft and non tender.  No significant distention s/p paracentesis yesterday. No abdominal bruits. No CVA tenderness. Genitourinary: 3+ scrotal edema with clear weeping fluid.  No purulence or erythema. The scrotum is reducible with palpation.  No testicular ttp. Musculoskeletal: No lower extremity tenderness nor edema.  No joint effusions. Neurologic:  Normal speech and language. No gross focal neurologic deficits are appreciated. No gait instability. Skin:  Skin is warm, dry and intact. No rash noted. Psychiatric: Mood and affect are normal. Speech and behavior are normal.  ____________________________________________   LABS (all labs ordered are listed, but only abnormal results are displayed)  Results for orders placed or performed during the hospital encounter of 10/25/16 (from the past 24 hour(s))  CBC with Differential/Platelet     Status: Abnormal   Collection Time: 10/25/16  6:10 PM  Result Value Ref Range   WBC 6.0 3.8 - 10.6 K/uL   RBC 3.46 (L) 4.40 - 5.90 MIL/uL   Hemoglobin 8.8 (L) 13.0 - 18.0 g/dL   HCT 26.6 (L) 40.0 - 52.0 %   MCV 76.9 (L) 80.0 - 100.0 fL   MCH 25.5 (L) 26.0 - 34.0 pg   MCHC 33.1 32.0 - 36.0 g/dL   RDW 18.2 (H) 11.5 - 14.5  %   Platelets 161 150 - 440 K/uL   Neutrophils Relative % 81 %   Neutro Abs 4.9 1.4 - 6.5 K/uL   Lymphocytes Relative 7 %   Lymphs Abs 0.4 (L) 1.0 - 3.6 K/uL   Monocytes Relative 10 %   Monocytes Absolute 0.6 0.2 - 1.0 K/uL   Eosinophils Relative 1 %   Eosinophils Absolute 0.0 0 - 0.7 K/uL   Basophils Relative 1 %   Basophils Absolute 0.0 0 - 0.1 K/uL  Comprehensive metabolic panel     Status: Abnormal   Collection Time: 10/25/16  6:10 PM  Result Value Ref Range   Sodium 133 (L) 135 - 145 mmol/L   Potassium 3.7 3.5 - 5.1 mmol/L   Chloride 103 101 - 111 mmol/L   CO2 25 22 - 32 mmol/L   Glucose, Bld 129 (H) 65 - 99 mg/dL   BUN 13 6 - 20 mg/dL   Creatinine, Ser 1.35 (H) 0.61 - 1.24 mg/dL   Calcium 8.0 (  L) 8.9 - 10.3 mg/dL   Total Protein 6.7 6.5 - 8.1 g/dL   Albumin 3.1 (L) 3.5 - 5.0 g/dL   AST 48 (H) 15 - 41 U/L   ALT 18 17 - 63 U/L   Alkaline Phosphatase 134 (H) 38 - 126 U/L   Total Bilirubin 1.3 (H) 0.3 - 1.2 mg/dL   GFR calc non Af Amer 52 (L) >60 mL/min   GFR calc Af Amer >60 >60 mL/min   Anion gap 5 5 - 15  Protime-INR     Status: None   Collection Time: 10/25/16  6:10 PM  Result Value Ref Range   Prothrombin Time 15.0 11.4 - 15.2 seconds   INR 1.17   Ammonia     Status: None   Collection Time: 10/25/16  7:50 PM  Result Value Ref Range   Ammonia 33 9 - 35 umol/L   ____________________________________________  EKG ____________________________________________  RADIOLOGY  I personally reviewed all radiographic images ordered to evaluate for the above acute complaints and reviewed radiology reports and findings.  These findings were personally discussed with the patient.  Please see medical record for radiology report.  ____________________________________________   PROCEDURES  Procedure(s) performed: none Procedures    Critical Care performed: no ____________________________________________   INITIAL IMPRESSION / ASSESSMENT AND PLAN / ED  COURSE  Pertinent labs & imaging results that were available during my care of the patient were reviewed by me and considered in my medical decision making (see chart for details).  DDX: chf, anasarca, fourniers, ascites, sbp  KAMANI LEWTER is a 70 y.o. who presents to the ED with History of severe end-stage cirrhosis as well as benzodiazepine dependence presents with the above complaints. Patient afebrile and hemodynamic stable. His exam as above. Scrotum is chronically swollen and does not demonstrate any blistering or erythema to suggest acute infectious process. This is not clinically consistent with Fournier's gangrene. His abdominal exam is soft and benign. He is afebrile and without a white count therefore do not feel this clinically consistent with SBP. Chest x-ray ordered to evaluate for any pneumonia or congestive heart failure. Chest x-ray shows no significant edema therefore feel this is more likely secondary to a chronic cirrhosis and recurrent ascites with chronic hypoalbuminemia.  The patient will be placed on continuous pulse oximetry and telemetry for monitoring.  Laboratory evaluation will be sent to evaluate for the above complaints.     Clinical Course as of Oct 26 36  Sat Oct 25, 2016  1934 Rechecked on patient. States that he is feeling much better after Valium and states the primary reason for coming to the ER today was that he ran out of his lorazepam which she's been taking for over 10 years. Denies any abdominal pain no worsening shortness of breath. Blood work is otherwise roughly at baseline.  [PR]  1938 Does not appear to be in withdrawal. Patient met with hospice today for informational purposes.  Would like to go home.  Does not appear to be demonstrating any signs of acute infectious process. There is no evidence of SBP.  Will check ammonia as this has been elevated in the past.  Will continue to monitor.  [PR]  2025 Ammonia level is normal.  Patient stable for DC  home and continued outpatient follow up with PCP , GI and further discussions with palliative care.  Will given Rx for low dose librium until patient can follow up with PCP and discuss further Rx. Have discussed with the  patient and available family all diagnostics and treatments performed thus far and all questions were answered to the best of my ability. The patient demonstrates understanding and agreement with plan.   [PR]    Clinical Course User Index [PR] Merlyn Lot, MD     ____________________________________________   FINAL CLINICAL IMPRESSION(S) / ED DIAGNOSES  Final diagnoses:  Scrotal swelling  Cirrhosis of liver with ascites, unspecified hepatic cirrhosis type ([place])  Anxiety  Benzodiazepine abuse, continuous      NEW MEDICATIONS STARTED DURING THIS VISIT:  New Prescriptions   No medications on file     Note:  This document was prepared using Dragon voice recognition software and may include unintentional dictation errors.    Merlyn Lot, MD 10/26/16 [phone]

## 2016-10-25 NOTE — ED Notes (Signed)
Pt assisted with telephone call. Pt denies further needs at this time. Call bell at side. Vss.

## 2016-10-25 NOTE — ED Notes (Signed)
Pt assisted up to commode for void.  

## 2016-10-25 NOTE — ED Triage Notes (Signed)
BIB EMS from home for c/o of worsening swelling in groin and navel. Pt with large amount of swelling to penis and scrotum with pus drainage noted from scrotum. Pt. Seen here yesterday for parcenthesis. For ascites. Pt also c/o productive cough for a couple of days. Denies fever

## 2016-10-29 ENCOUNTER — Ambulatory Visit: Payer: Medicare Other

## 2016-10-30 MED ORDER — ALBUMIN HUMAN 25 % IV SOLN
25.0000 g | Freq: Once | INTRAVENOUS | Status: AC
  Administered 2016-10-31: 25 g via INTRAVENOUS
  Filled 2016-10-30: qty 100

## 2016-10-30 NOTE — Discharge Instructions (Signed)
Paracentesis, Care After °Introduction °Refer to this sheet in the next few weeks. These instructions provide you with information about caring for yourself after your procedure. Your health care provider may also give you more specific instructions. Your treatment has been planned according to current medical practices, but problems sometimes occur. Call your health care provider if you have any problems or questions after your procedure. °What can I expect after the procedure? °After your procedure, it is common to have a small amount of clear fluid coming from the puncture site. °Follow these instructions at home: °· Return to your normal activities as told by your health care provider. Ask your health care provider what activities are safe for you. °· Take over-the-counter and prescription medicines only as told by your health care provider. °· Do not take baths, swim, or use a hot tub until your health care provider approves. °· Follow instructions from your health care provider about: °¨ How to take care of your puncture site. °¨ When and how you should change your bandage (dressing). °¨ When you should remove your dressing. °· Check your puncture area every day signs of infection. Watch for: °¨ Redness, swelling, or pain. °¨ Fluid, blood, or pus. °· Keep all follow-up visits as told by your health care provider. This is important. °Contact a health care provider if: °· You have redness, swelling, or pain at your puncture site. °· You start to have more clear fluid coming from your puncture site. °· You have blood or pus coming from your puncture site. °· You have chills. °· You have a fever. °Get help right away if: °· You develop chest pain or shortness of breath. °· You develop increasing pain, discomfort, or swelling in your abdomen. °· You feel dizzy or light-headed or you pass out. °This information is not intended to replace advice given to you by your health care provider. Make sure you discuss any  questions you have with your health care provider. °Document Released: 03/13/2015 Document Revised: 04/03/2016 Document Reviewed: 01/09/2015 °© 2017 [employer]vier ° °

## 2016-10-31 ENCOUNTER — Ambulatory Visit
Admission: RE | Admit: 2016-10-31 | Discharge: 2016-10-31 | Disposition: A | Discharge status: Home / Self Care | Source: Ambulatory Visit | Attending: Unknown Physician Specialty | Admitting: Unknown Physician Specialty

## 2016-10-31 DIAGNOSIS — K7031 Alcoholic cirrhosis of liver with ascites: Secondary | ICD-10-CM | POA: Diagnosis present

## 2016-10-31 LAB — SODIUM: Sodium: 138 mmol/L (ref 135–145)

## 2016-11-05 ENCOUNTER — Ambulatory Visit: Payer: Medicare Other

## 2016-11-06 DIAGNOSIS — K7031 Alcoholic cirrhosis of liver with ascites: Secondary | ICD-10-CM | POA: Diagnosis not present

## 2016-11-06 MED ORDER — ALBUMIN HUMAN 25 % IV SOLN: 25.0000 g | Freq: Once | INTRAVENOUS | Status: DC

## 2016-11-06 NOTE — Discharge Instructions (Signed)
Paracentesis, Care After °Introduction °Refer to this sheet in the next few weeks. These instructions provide you with information about caring for yourself after your procedure. Your health care provider may also give you more specific instructions. Your treatment has been planned according to current medical practices, but problems sometimes occur. Call your health care provider if you have any problems or questions after your procedure. °What can I expect after the procedure? °After your procedure, it is common to have a small amount of clear fluid coming from the puncture site. °Follow these instructions at home: °· Return to your normal activities as told by your health care provider. Ask your health care provider what activities are safe for you. °· Take over-the-counter and prescription medicines only as told by your health care provider. °· Do not take baths, swim, or use a hot tub until your health care provider approves. °· Follow instructions from your health care provider about: °¨ How to take care of your puncture site. °¨ When and how you should change your bandage (dressing). °¨ When you should remove your dressing. °· Check your puncture area every day signs of infection. Watch for: °¨ Redness, swelling, or pain. °¨ Fluid, blood, or pus. °· Keep all follow-up visits as told by your health care provider. This is important. °Contact a health care provider if: °· You have redness, swelling, or pain at your puncture site. °· You start to have more clear fluid coming from your puncture site. °· You have blood or pus coming from your puncture site. °· You have chills. °· You have a fever. °Get help right away if: °· You develop chest pain or shortness of breath. °· You develop increasing pain, discomfort, or swelling in your abdomen. °· You feel dizzy or light-headed or you pass out. °This information is not intended to replace advice given to you by your health care provider. Make sure you discuss any  questions you have with your health care provider. °Document Released: 03/13/2015 Document Revised: 04/03/2016 Document Reviewed: 01/09/2015 °© 2017 [employer]vier ° °

## 2016-11-07 ENCOUNTER — Ambulatory Visit
Admission: RE | Admit: 2016-11-07 | Discharge: 2016-11-07 | Disposition: A | Discharge status: Home / Self Care | Source: Ambulatory Visit | Attending: Unknown Physician Specialty | Admitting: Unknown Physician Specialty

## 2016-11-07 DIAGNOSIS — K7031 Alcoholic cirrhosis of liver with ascites: Secondary | ICD-10-CM | POA: Diagnosis not present

## 2016-11-07 LAB — SODIUM: SODIUM: 137 mmol/L (ref 135–145)

## 2016-11-07 MED ORDER — ALBUMIN HUMAN 25 % IV SOLN
25.0000 g | Freq: Once | INTRAVENOUS | Status: AC
  Administered 2016-11-07: 25 g via INTRAVENOUS
  Filled 2016-11-07: qty 100

## 2016-11-13 ENCOUNTER — Other Ambulatory Visit: Payer: Self-pay | Admitting: Unknown Physician Specialty

## 2016-11-13 DIAGNOSIS — K7031 Alcoholic cirrhosis of liver with ascites: Secondary | ICD-10-CM

## 2016-11-14 ENCOUNTER — Other Ambulatory Visit: Payer: Self-pay | Admitting: Unknown Physician Specialty

## 2016-11-14 ENCOUNTER — Ambulatory Visit
Admission: RE | Admit: 2016-11-14 | Discharge: 2016-11-14 | Disposition: A | Discharge status: Home / Self Care | Source: Ambulatory Visit | Attending: Unknown Physician Specialty | Admitting: Unknown Physician Specialty

## 2016-11-14 DIAGNOSIS — K7031 Alcoholic cirrhosis of liver with ascites: Secondary | ICD-10-CM | POA: Diagnosis present

## 2016-11-14 LAB — SODIUM: SODIUM: 139 mmol/L (ref 135–145)

## 2016-11-14 MED ORDER — ALBUMIN HUMAN 25 % IV SOLN
25.0000 g | Freq: Once | INTRAVENOUS | Status: AC
  Administered 2016-11-14: 25 g via INTRAVENOUS
  Filled 2016-11-14 (×2): qty 100

## 2016-11-14 NOTE — Discharge Instructions (Signed)
Paracentesis, Care After °Introduction °Refer to this sheet in the next few weeks. These instructions provide you with information about caring for yourself after your procedure. Your health care provider may also give you more specific instructions. Your treatment has been planned according to current medical practices, but problems sometimes occur. Call your health care provider if you have any problems or questions after your procedure. °What can I expect after the procedure? °After your procedure, it is common to have a small amount of clear fluid coming from the puncture site. °Follow these instructions at home: °· Return to your normal activities as told by your health care provider. Ask your health care provider what activities are safe for you. °· Take over-the-counter and prescription medicines only as told by your health care provider. °· Do not take baths, swim, or use a hot tub until your health care provider approves. °· Follow instructions from your health care provider about: °¨ How to take care of your puncture site. °¨ When and how you should change your bandage (dressing). °¨ When you should remove your dressing. °· Check your puncture area every day signs of infection. Watch for: °¨ Redness, swelling, or pain. °¨ Fluid, blood, or pus. °· Keep all follow-up visits as told by your health care provider. This is important. °Contact a health care provider if: °· You have redness, swelling, or pain at your puncture site. °· You start to have more clear fluid coming from your puncture site. °· You have blood or pus coming from your puncture site. °· You have chills. °· You have a fever. °Get help right away if: °· You develop chest pain or shortness of breath. °· You develop increasing pain, discomfort, or swelling in your abdomen. °· You feel dizzy or light-headed or you pass out. °This information is not intended to replace advice given to you by your health care provider. Make sure you discuss any  questions you have with your health care provider. °Document Released: 03/13/2015 Document Revised: 04/03/2016 Document Reviewed: 01/09/2015 °© 2017 [employer]vier ° °

## 2016-11-14 NOTE — Procedures (Signed)
Successful US guided paracentesis.  Removed 6.6 liters of yellow fluid.  No immediate complication.  See full report in IMAGING.

## 2016-11-21 ENCOUNTER — Ambulatory Visit
Admission: RE | Admit: 2016-11-21 | Discharge: 2016-11-21 | Disposition: A | Discharge status: Home / Self Care | Source: Ambulatory Visit | Attending: Unknown Physician Specialty | Admitting: Unknown Physician Specialty

## 2016-11-21 DIAGNOSIS — R188 Other ascites: Secondary | ICD-10-CM | POA: Insufficient documentation

## 2016-11-21 DIAGNOSIS — K7031 Alcoholic cirrhosis of liver with ascites: Secondary | ICD-10-CM

## 2016-11-21 LAB — SODIUM: Sodium: 137 mmol/L (ref 135–145)

## 2016-11-21 MED ORDER — ALBUMIN HUMAN 25 % IV SOLN
25.0000 g | Freq: Once | INTRAVENOUS | Status: AC
  Administered 2016-11-21: 25 g via INTRAVENOUS
  Filled 2016-11-21: qty 100

## 2016-11-21 NOTE — Procedures (Signed)
US guided paracentesis.  Removed 7.75 liters from LLQ.  No immediate complication

## 2016-11-25 ENCOUNTER — Other Ambulatory Visit: Payer: Self-pay | Admitting: Unknown Physician Specialty

## 2016-11-25 DIAGNOSIS — K7031 Alcoholic cirrhosis of liver with ascites: Secondary | ICD-10-CM

## 2016-11-28 ENCOUNTER — Ambulatory Visit
Admission: RE | Admit: 2016-11-28 | Discharge: 2016-11-28 | Disposition: A | Discharge status: Home / Self Care | Source: Ambulatory Visit | Attending: Unknown Physician Specialty | Admitting: Unknown Physician Specialty

## 2016-11-28 MED ORDER — ALBUMIN HUMAN 25 % IV SOLN
25.0000 g | Freq: Once | INTRAVENOUS | Status: DC
  Filled 2016-11-28: qty 100

## 2016-12-01 ENCOUNTER — Ambulatory Visit
Admission: RE | Admit: 2016-12-01 | Discharge: 2016-12-01 | Disposition: A | Discharge status: Home / Self Care | Source: Ambulatory Visit | Attending: Unknown Physician Specialty | Admitting: Unknown Physician Specialty

## 2016-12-01 DIAGNOSIS — K7031 Alcoholic cirrhosis of liver with ascites: Secondary | ICD-10-CM | POA: Diagnosis present

## 2016-12-01 LAB — SODIUM: Sodium: 131 mmol/L — ABNORMAL LOW (ref 135–145)

## 2016-12-01 MED ORDER — ALBUMIN HUMAN 25 % IV SOLN
25.0000 g | Freq: Once | INTRAVENOUS | Status: AC
  Administered 2016-12-01: 25 g via INTRAVENOUS
  Filled 2016-12-01: qty 100

## 2016-12-03 ENCOUNTER — Other Ambulatory Visit: Payer: Self-pay | Admitting: Unknown Physician Specialty

## 2016-12-03 DIAGNOSIS — K7031 Alcoholic cirrhosis of liver with ascites: Secondary | ICD-10-CM

## 2016-12-05 ENCOUNTER — Ambulatory Visit
Admission: RE | Admit: 2016-12-05 | Discharge: 2016-12-05 | Disposition: A | Discharge status: Home / Self Care | Source: Ambulatory Visit | Attending: Unknown Physician Specialty | Admitting: Unknown Physician Specialty

## 2016-12-05 DIAGNOSIS — K7031 Alcoholic cirrhosis of liver with ascites: Secondary | ICD-10-CM | POA: Insufficient documentation

## 2016-12-05 LAB — SODIUM: Sodium: 132 mmol/L — ABNORMAL LOW (ref 135–145)

## 2016-12-05 MED ORDER — ALBUMIN HUMAN 25 % IV SOLN
25.0000 g | Freq: Once | INTRAVENOUS | Status: AC
  Administered 2016-12-05: 25 g via INTRAVENOUS
  Filled 2016-12-05: qty 100

## 2016-12-10 ENCOUNTER — Other Ambulatory Visit: Payer: Self-pay | Admitting: Unknown Physician Specialty

## 2016-12-10 DIAGNOSIS — K7031 Alcoholic cirrhosis of liver with ascites: Secondary | ICD-10-CM

## 2016-12-12 ENCOUNTER — Ambulatory Visit
Admission: RE | Admit: 2016-12-12 | Discharge: 2016-12-12 | Disposition: A | Discharge status: Home / Self Care | Source: Ambulatory Visit | Attending: Unknown Physician Specialty | Admitting: Unknown Physician Specialty

## 2016-12-12 DIAGNOSIS — K7031 Alcoholic cirrhosis of liver with ascites: Secondary | ICD-10-CM | POA: Diagnosis not present

## 2016-12-12 LAB — SODIUM: Sodium: 132 mmol/L — ABNORMAL LOW (ref 135–145)

## 2016-12-12 MED ORDER — ALBUMIN HUMAN 25 % IV SOLN
25.0000 g | Freq: Once | INTRAVENOUS | Status: AC
  Administered 2016-12-12: 25 g via INTRAVENOUS
  Filled 2016-12-12: qty 100

## 2016-12-12 NOTE — Procedures (Signed)
US guided paracentesis.  Removed 7.5 liters of fluid.  No immediate complication.

## 2016-12-19 ENCOUNTER — Ambulatory Visit
Admission: RE | Admit: 2016-12-19 | Discharge: 2016-12-19 | Disposition: A | Discharge status: Home / Self Care | Source: Ambulatory Visit | Attending: Unknown Physician Specialty | Admitting: Unknown Physician Specialty

## 2016-12-19 MED ORDER — ALBUMIN HUMAN 25 % IV SOLN
25.0000 g | Freq: Once | INTRAVENOUS | Status: DC
  Filled 2016-12-19: qty 100

## 2016-12-22 ENCOUNTER — Ambulatory Visit
Admission: RE | Admit: 2016-12-22 | Discharge: 2016-12-22 | Disposition: A | Discharge status: Home / Self Care | Source: Ambulatory Visit | Attending: Unknown Physician Specialty | Admitting: Unknown Physician Specialty

## 2016-12-22 DIAGNOSIS — K7031 Alcoholic cirrhosis of liver with ascites: Secondary | ICD-10-CM | POA: Diagnosis not present

## 2016-12-22 LAB — SODIUM: SODIUM: 132 mmol/L — AB (ref 135–145)

## 2016-12-22 MED ORDER — ALBUMIN HUMAN 25 % IV SOLN
25.0000 g | Freq: Once | INTRAVENOUS | Status: AC
  Administered 2016-12-22: 25 g via INTRAVENOUS
  Filled 2016-12-22: qty 100

## 2016-12-22 NOTE — Procedures (Signed)
Interventional Radiology Procedure Note  Procedure: US guided para.  .  Complications: None Recommendations:  - Ok to DC when done - Do not submerge for 7 days - Routine care   Signed,  Yvone NeuJaime S. Loreta AveWagner, DO

## 2016-12-26 ENCOUNTER — Ambulatory Visit
Admission: RE | Admit: 2016-12-26 | Discharge: 2016-12-26 | Disposition: A | Discharge status: Home / Self Care | Source: Ambulatory Visit | Attending: Unknown Physician Specialty | Admitting: Unknown Physician Specialty

## 2016-12-26 DIAGNOSIS — R188 Other ascites: Secondary | ICD-10-CM | POA: Diagnosis not present

## 2016-12-26 DIAGNOSIS — K7031 Alcoholic cirrhosis of liver with ascites: Secondary | ICD-10-CM

## 2016-12-26 LAB — SODIUM: Sodium: 134 mmol/L — ABNORMAL LOW (ref 135–145)

## 2016-12-26 MED ORDER — ALBUMIN HUMAN 25 % IV SOLN
25.0000 g | Freq: Once | INTRAVENOUS | Status: AC
  Administered 2016-12-26: 25 g via INTRAVENOUS
  Filled 2016-12-26: qty 100

## 2016-12-26 NOTE — Procedures (Signed)
US guided paracentesis.  Removed 9 liters of yellow fluid.  No immediate complication.

## 2016-12-30 NOTE — Discharge Instructions (Signed)
Paracentesis, Care After °Introduction °Refer to this sheet in the next few weeks. These instructions provide you with information about caring for yourself after your procedure. Your health care provider may also give you more specific instructions. Your treatment has been planned according to current medical practices, but problems sometimes occur. Call your health care provider if you have any problems or questions after your procedure. °What can I expect after the procedure? °After your procedure, it is common to have a small amount of clear fluid coming from the puncture site. °Follow these instructions at home: °· Return to your normal activities as told by your health care provider. Ask your health care provider what activities are safe for you. °· Take over-the-counter and prescription medicines only as told by your health care provider. °· Do not take baths, swim, or use a hot tub until your health care provider approves. °· Follow instructions from your health care provider about: °¨ How to take care of your puncture site. °¨ When and how you should change your bandage (dressing). °¨ When you should remove your dressing. °· Check your puncture area every day signs of infection. Watch for: °¨ Redness, swelling, or pain. °¨ Fluid, blood, or pus. °· Keep all follow-up visits as told by your health care provider. This is important. °Contact a health care provider if: °· You have redness, swelling, or pain at your puncture site. °· You start to have more clear fluid coming from your puncture site. °· You have blood or pus coming from your puncture site. °· You have chills. °· You have a fever. °Get help right away if: °· You develop chest pain or shortness of breath. °· You develop increasing pain, discomfort, or swelling in your abdomen. °· You feel dizzy or light-headed or you pass out. °This information is not intended to replace advice given to you by your health care provider. Make sure you discuss any  questions you have with your health care provider. °Document Released: 03/13/2015 Document Revised: 04/03/2016 Document Reviewed: 01/09/2015 °© 2017 [employer]vier ° °

## 2017-01-02 ENCOUNTER — Ambulatory Visit
Admission: RE | Admit: 2017-01-02 | Discharge: 2017-01-02 | Disposition: A | Discharge status: Home / Self Care | Source: Ambulatory Visit | Attending: Unknown Physician Specialty | Admitting: Unknown Physician Specialty

## 2017-01-02 DIAGNOSIS — K7031 Alcoholic cirrhosis of liver with ascites: Secondary | ICD-10-CM | POA: Diagnosis present

## 2017-01-02 LAB — SODIUM: Sodium: 133 mmol/L — ABNORMAL LOW (ref 135–145)

## 2017-01-02 MED ORDER — ALBUMIN HUMAN 25 % IV SOLN
25.0000 g | Freq: Once | INTRAVENOUS | Status: AC
  Administered 2017-01-02: 25 g via INTRAVENOUS
  Filled 2017-01-02: qty 100

## 2017-01-02 NOTE — OR Nursing (Deleted)
#  14 french foley catheter placed for procedure. Scant urine return but pt reported voiding just prior to catheter insertion. Denied continued discomfort after placement. Catheter to be removed by radiology staff after cystogram completed. 10 ml saline infused into balloon to hold in bladder, staff aware.

## 2017-01-06 ENCOUNTER — Other Ambulatory Visit: Payer: Self-pay | Admitting: Unknown Physician Specialty

## 2017-01-06 NOTE — Discharge Instructions (Signed)
Paracentesis, Care After  Refer to this sheet in the next few weeks. These instructions provide you with information about caring for yourself after your procedure. Your health care provider may also give you more specific instructions. Your treatment has been planned according to current medical practices, but problems sometimes occur. Call your health care provider if you have any problems or questions after your procedure.  What can I expect after the procedure?  After your procedure, it is common to have a small amount of clear fluid coming from the puncture site.  Follow these instructions at home:  · Return to your normal activities as told by your health care provider. Ask your health care provider what activities are safe for you.  · Take over-the-counter and prescription medicines only as told by your health care provider.  · Do not take baths, swim, or use a hot tub until your health care provider approves.  · Follow instructions from your health care provider about:  ? How to take care of your puncture site.  ? When and how you should change your bandage (dressing).  ? When you should remove your dressing.  · Check your puncture area every day signs of infection. Watch for:  ? Redness, swelling, or pain.  ? Fluid, blood, or pus.  · Keep all follow-up visits as told by your health care provider. This is important.  Contact a health care provider if:  · You have redness, swelling, or pain at your puncture site.  · You start to have more clear fluid coming from your puncture site.  · You have blood or pus coming from your puncture site.  · You have chills.  · You have a fever.  Get help right away if:  · You develop chest pain or shortness of breath.  · You develop increasing pain, discomfort, or swelling in your abdomen.  · You feel dizzy or light-headed or you pass out.  This information is not intended to replace advice given to you by your health care provider. Make sure you discuss any questions you  have with your health care provider.  Document Released: 03/13/2015 Document Revised: 04/03/2016 Document Reviewed: 01/09/2015  Elsevier Interactive Patient Education © 2017 [employer]

## 2017-01-09 ENCOUNTER — Ambulatory Visit
Admission: RE | Admit: 2017-01-09 | Discharge: 2017-01-09 | Disposition: A | Discharge status: Home / Self Care | Source: Ambulatory Visit | Attending: Unknown Physician Specialty | Admitting: Unknown Physician Specialty

## 2017-01-09 DIAGNOSIS — R188 Other ascites: Secondary | ICD-10-CM | POA: Diagnosis present

## 2017-01-09 DIAGNOSIS — K7031 Alcoholic cirrhosis of liver with ascites: Secondary | ICD-10-CM

## 2017-01-09 LAB — SODIUM: Sodium: 128 mmol/L — ABNORMAL LOW (ref 135–145)

## 2017-01-09 MED ORDER — ALBUMIN HUMAN 25 % IV SOLN
25.0000 g | Freq: Once | INTRAVENOUS | Status: AC
  Administered 2017-01-09: 25 g via INTRAVENOUS
  Filled 2017-01-09: qty 100

## 2017-01-09 NOTE — Procedures (Signed)
US guided paracentesis.  Removed 5.5 liters of cloudy yellow fluid.  No immediate complication.

## 2017-01-16 ENCOUNTER — Ambulatory Visit
Admission: RE | Admit: 2017-01-16 | Discharge: 2017-01-16 | Disposition: A | Discharge status: Home / Self Care | Source: Ambulatory Visit | Attending: Unknown Physician Specialty | Admitting: Unknown Physician Specialty

## 2017-01-16 DIAGNOSIS — Z87891 Personal history of nicotine dependence: Secondary | ICD-10-CM | POA: Diagnosis not present

## 2017-01-16 DIAGNOSIS — I10 Essential (primary) hypertension: Secondary | ICD-10-CM | POA: Diagnosis not present

## 2017-01-16 DIAGNOSIS — K219 Gastro-esophageal reflux disease without esophagitis: Secondary | ICD-10-CM | POA: Insufficient documentation

## 2017-01-16 DIAGNOSIS — Z880 Allergy status to penicillin: Secondary | ICD-10-CM | POA: Diagnosis not present

## 2017-01-16 DIAGNOSIS — Z9049 Acquired absence of other specified parts of digestive tract: Secondary | ICD-10-CM | POA: Insufficient documentation

## 2017-01-16 DIAGNOSIS — Z888 Allergy status to other drugs, medicaments and biological substances status: Secondary | ICD-10-CM | POA: Diagnosis not present

## 2017-01-16 DIAGNOSIS — Z933 Colostomy status: Secondary | ICD-10-CM | POA: Insufficient documentation

## 2017-01-16 DIAGNOSIS — K7460 Unspecified cirrhosis of liver: Secondary | ICD-10-CM | POA: Diagnosis not present

## 2017-01-16 DIAGNOSIS — Z8249 Family history of ischemic heart disease and other diseases of the circulatory system: Secondary | ICD-10-CM | POA: Insufficient documentation

## 2017-01-16 DIAGNOSIS — Z66 Do not resuscitate: Secondary | ICD-10-CM | POA: Diagnosis not present

## 2017-01-16 DIAGNOSIS — R188 Other ascites: Secondary | ICD-10-CM | POA: Insufficient documentation

## 2017-01-16 DIAGNOSIS — K7031 Alcoholic cirrhosis of liver with ascites: Secondary | ICD-10-CM

## 2017-01-16 LAB — SODIUM: SODIUM: 134 mmol/L — AB (ref 135–145)

## 2017-01-16 MED ORDER — ALBUMIN HUMAN 25 % IV SOLN
25.0000 g | Freq: Once | INTRAVENOUS | Status: DC
  Filled 2017-01-16: qty 100

## 2017-01-16 NOTE — H&P (Signed)
Chief Complaint: Patient was seen in consultation today for US guided paracentesis at the request of Jeff Preston  Referring Physician(s): Jeff Preston   Patient Status: [facility] - Out-pt  History of Present Illness: Jeff Preston is a 71 y.o. male who is well known to Radiology and presents for his weekly paracentesis with Albumin.  Patient has cirrhosis with refractory ascites. He is complaining of muscle spasms in his hips.  His speech is slow but this seems to be his baseline from my previous interactions with him.    Past Medical History:  Diagnosis Date  . Anxiety   . Anxiety   . Ascites   . Chronic hyponatremia   . Cirrhosis (HCC)   . Depression   . DNR (do not resuscitate)   . GERD (gastroesophageal reflux disease)   . Hypertension   . Umbilical hernia     Past Surgical History:  Procedure Laterality Date  . CHOLECYSTECTOMY    . COLON SURGERY    . COLOSTOMY N/A   . THROAT SURGERY      Allergies: Dilantin [phenytoin sodium extended] and Penicillins  Medications: Prior to Admission medications   Medication Sig Start Date End Date Taking? Authorizing Provider  bisacodyl (DULCOLAX) 5 MG EC tablet Take 1 tablet (5 mg total) by mouth daily as needed for moderate constipation. 03/16/16   Ramonita Lab, MD  chlordiazePOXIDE (LIBRIUM) 25 MG capsule Take 1 capsule (25 mg total) by mouth 3 (three) times daily as needed for anxiety. 50 mg 3 times a day x 3 days 50 mg 2 times a day for 3 days 50 mg once a day for 3 days stop  Total of 36 pills 10/25/16   Willy Eddy, MD  chlordiazePOXIDE (LIBRIUM) 25 MG capsule Take 1 capsule (25 mg total) by mouth 3 (three) times daily as needed for anxiety. 10/25/16   Willy Eddy, MD  feeding supplement, ENSURE ENLIVE, (ENSURE ENLIVE) LIQD Take 237 mLs by mouth 2 (two) times daily between meals. 09/24/16   Katha Hamming, MD  furosemide (LASIX) 40 MG tablet Take 1 tablet (40 mg total) by mouth daily. 05/01/16    Srikar Sudini, MD  lactulose (CHRONULAC) 10 GM/15ML solution Take 15 mLs (10 g total) by mouth daily. 09/06/16   Sharman Cheek, MD  LORazepam (ATIVAN) 1 MG tablet Take 1 tablet (1 mg total) by mouth 3 (three) times daily. 09/24/16   Katha Hamming, MD  mirtazapine (REMERON) 15 MG tablet Take 1 tablet (15 mg total) by mouth at bedtime. 09/24/16   Katha Hamming, MD  nadolol (CORGARD) 20 MG tablet Take 20 mg by mouth daily.    Historical Provider, MD  omeprazole (PRILOSEC) 20 MG capsule Take 20 mg by mouth 2 (two) times daily.     Historical Provider, MD  potassium chloride SA (K-DUR,KLOR-CON) 20 MEQ tablet Take 20 mEq by mouth 2 (two) times daily. 05/28/16   Historical Provider, MD  simethicone (MYLICON) 80 MG chewable tablet Chew 160-240 mg by mouth every 6 (six) hours as needed for flatulence.     Historical Provider, MD  sodium chloride 1 g tablet Take 1 tablet (1 g total) by mouth daily. 05/01/16   Milagros Loll, MD  spironolactone (ALDACTONE) 50 MG tablet Take 50 mg by mouth daily. 04/14/16   Historical Provider, MD  traMADol (ULTRAM) 50 MG tablet Take 1 tablet (50 mg total) by mouth every 6 (six) hours as needed for moderate pain or severe pain. 05/01/16   Milagros Loll, MD  Family History  Problem Relation Age of Onset  . CAD Mother     Social History   Social History  . Marital status: Single    Spouse name: N/A  . Number of children: N/A  . Years of education: N/A   Social History Main Topics  . Smoking status: Former Games developer  . Smokeless tobacco: Former Neurosurgeon    Quit date: 07/04/2016  . Alcohol use No  . Drug use: No  . Sexual activity: No   Other Topics Concern  . Not on file   Social History Narrative  . No narrative on file     Review of Systems  Gastrointestinal: Positive for abdominal distention.  Musculoskeletal:       Muscle spasms in hips.    Vital Signs: BP 131/72 (BP Location: Left Arm)   Pulse 81 Comment: post  Resp 18   SpO2 99%    Physical Exam  Abdominal: He exhibits distension.  Large volume ascites          Imaging: US Paracentesis  Result Date: 01/16/2017 INDICATION: Recurrent ascites. EXAM: ULTRASOUND GUIDED PARACENTESIS MEDICATIONS: None. COMPLICATIONS: None immediate. PROCEDURE: Informed written consent was obtained from the patient after a discussion of the risks, benefits and alternatives to treatment. A timeout was performed prior to the initiation of the procedure. Initial ultrasound scanning demonstrates a large amount of ascites within the left lower abdominal quadrant. The left lower abdomen was prepped and draped in the usual sterile fashion. 1% lidocaine with epinephrine was used for local anesthesia. Following this, a 6 Fr Safe-Preston-Centesis catheter was introduced. An ultrasound image was saved for documentation purposes. The paracentesis was performed. The catheter was removed and a dressing was applied. The patient tolerated the procedure well without immediate post procedural complication. FINDINGS: A total of approximately 8.4 L of yellow fluid was removed. IMPRESSION: Successful ultrasound-guided paracentesis yielding 8.4 liters of peritoneal fluid. Electronically Signed   By: Richarda Overlie M.D.   On: 01/16/2017 14:42   US Paracentesis  Result Date: 01/09/2017 INDICATION: Recurrent ascites. EXAM: ULTRASOUND GUIDED PARACENTESIS MEDICATIONS: None. COMPLICATIONS: None immediate. PROCEDURE: Informed written consent was obtained from the patient after a discussion of the risks, benefits and alternatives to treatment. A timeout was performed prior to the initiation of the procedure. Initial ultrasound scanning demonstrates a large amount of ascites within the left lower abdominal quadrant. The left lower abdomen was prepped and draped in the usual sterile fashion. 1% lidocaine was used for local anesthesia. Following this, a 6 Fr Safe-Preston-Centesis catheter was introduced. An ultrasound image was saved for  documentation purposes. The paracentesis was performed. The catheter was removed and a dressing was applied. The patient tolerated the procedure well without immediate post procedural complication. FINDINGS: A total of approximately 5.5 L of cloudy yellow fluid was removed. IMPRESSION: Successful ultrasound-guided paracentesis yielding 5.5 liters of peritoneal fluid. Electronically Signed   By: Richarda Overlie M.D.   On: 01/09/2017 12:50   US Paracentesis  Result Date: 01/02/2017 INDICATION: Recurrent ascites. EXAM: ULTRASOUND-GUIDED PARACENTESIS COMPARISON:  Multiple previous ultrasound-guided paracenteses. MEDICATIONS: None. COMPLICATIONS: None immediate. TECHNIQUE: Informed written consent was obtained from the patient after a discussion of the risks, benefits and alternatives to treatment. A timeout was performed prior to the initiation of the procedure. Initial ultrasound scanning demonstrates a moderate to large amount of ascites within the left lower abdominal quadrant. The left lower abdomen was prepped and draped in the usual sterile fashion. 1% lidocaine with epinephrine was used for local  anesthesia. An ultrasound image was saved for documentation purposed. An 8 Fr Safe-Preston-Centesis catheter was introduced. The paracentesis was performed. The catheter was removed and a dressing was applied. The patient tolerated the procedure well without immediate post procedural complication. FINDINGS: A total of approximately 8 liters of serous fluid was removed. IMPRESSION: Successful ultrasound-guided paracentesis yielding 8 liters of peritoneal fluid. Electronically Signed   By: Simonne Come M.D.   On: 01/02/2017 12:42   US Paracentesis  Result Date: 12/26/2016 INDICATION: Recurrent ascites. EXAM: ULTRASOUND GUIDED PARACENTESIS MEDICATIONS: None. COMPLICATIONS: None immediate. PROCEDURE: Informed written consent was obtained from the patient after a discussion of the risks, benefits and alternatives to treatment. A  timeout was performed prior to the initiation of the procedure. Initial ultrasound scanning demonstrates a large amount of ascites within the left lower abdominal quadrant. The left lower abdomen was prepped and draped in the usual sterile fashion. 1% lidocaine was used for local anesthesia. Following this, a 6 Fr Safe-Preston-Centesis catheter was introduced. An ultrasound image was saved for documentation purposes. The paracentesis was performed. The catheter was removed and a dressing was applied. The patient tolerated the procedure well without immediate post procedural complication. FINDINGS: A total of approximately 9 L of yellow fluid was removed. IMPRESSION: Successful ultrasound-guided paracentesis yielding 9 liters of peritoneal fluid. Electronically Signed   By: Richarda Overlie M.D.   On: 12/26/2016 13:14   US Paracentesis  Result Date: 12/22/2016 INDICATION: 71 year old with recurrent ascites EXAM: ULTRASOUND GUIDED  PARACENTESIS MEDICATIONS: None. COMPLICATIONS: None PROCEDURE: Informed written consent was obtained from the patient after a discussion of the risks, benefits and alternatives to treatment. A timeout was performed prior to the initiation of the procedure. Initial ultrasound scanning demonstrates a large amount of ascites within the right lower abdominal quadrant. The right lower abdomen was prepped and draped in the usual sterile fashion. 1% lidocaine with epinephrine was used for local anesthesia. Following this, a 8 Fr Safe-Preston-Centesis catheter was introduced. An ultrasound image was saved for documentation purposes. The paracentesis was performed. The catheter was removed and a dressing was applied. The patient tolerated the procedure well without immediate post procedural complication. FINDINGS: A total of approximately 7.8 L of thin yellow fluid was removed. IMPRESSION: Status post ultrasound-guided paracentesis. Signed, Yvone Neu. Loreta Ave, DO [facility]  [facility] Electronically Signed   By: Gilmer Mor D.O.   On: 12/22/2016 12:50    Labs:  CBC:  Recent Labs  09/22/16 1712 09/23/16 0433 09/24/16 0440 10/25/16 1810  WBC 10.0 3.5* 4.2 6.0  HGB 10.9* 9.5* 9.8* 8.8*  HCT 32.7* 28.6* 29.6* 26.6*  PLT 197 117* 118* 161    COAGS:  Recent Labs  03/12/16 1852 03/15/16 0520  04/26/16 1745 06/20/16 1340 09/23/16 0433 10/25/16 1810  INR 1.48 1.32  < > 1.21 1.29 1.32 1.17  APTT 38* 38*  --   --   --   --   --   < > = values in this interval not displayed.  BMP:  Recent Labs  09/22/16 1712 09/23/16 0433 09/24/16 0440  10/25/16 1810  12/26/16 1019 01/02/17 1030 01/09/17 1027 01/16/17 1119  NA 129* 135 137  < > 133*  < > 134* 133* 128* 134*  K 2.8* 3.3* 4.0  --  3.7  --   --   --   --   --   CL 95* 102 103  --  103  --   --   --   --   --  CO2 24 24 30   --  25  --   --   --   --   --   GLUCOSE 93 94 111*  --  129*  --   --   --   --   --   BUN 11 11 16   --  13  --   --   --   --   --   CALCIUM 7.8* 7.6* 7.6*  --  8.0*  --   --   --   --   --   CREATININE 1.42* 1.20 1.43*  --  1.35*  --   --   --   --   --   GFRNONAA 49* 60* 48*  --  52*  --   --   --   --   --   GFRAA 57* >60 56*  --  >60  --   --   --   --   --   < > = values in this interval not displayed.  LIVER FUNCTION TESTS:  Recent Labs  09/02/16 1228 09/06/16 1204 09/22/16 1712 10/25/16 1810  BILITOT 0.9 1.4* 1.8* 1.3*  AST 48* 73* 47* 48*  ALT 19 21 18 18   ALKPHOS 158* 153* 130* 134*  PROT 6.6 6.8 6.7 6.7  ALBUMIN 2.7* 2.9* 3.0* 3.1*    TUMOR MARKERS: No results for input(s): AFPTM, CEA, CA199, CHROMGRNA in the last 8760 hours.  Assessment and Plan:  71 yo with cirrhosis and refractory ascites presents for routine paracentesis with sodium lab and albumin.  Patient is very familiar with the procedure and no significant change in his medical history.  Main complaint is occasion muscle spasms.  Plan for US guided paracentesis and  informed consent obtained from the patient.     Electronically Signed: Abundio MiuHENN, Zakia Sainato RYAN 01/16/2017, 3:56 PM

## 2017-01-16 NOTE — Procedures (Signed)
Successful US guided paracentesis.  Removed 8.4 liters of yellow fluid.  Minimal blood loss.  No immediate complication.

## 2017-01-23 ENCOUNTER — Ambulatory Visit
Admission: RE | Admit: 2017-01-23 | Discharge: 2017-01-23 | Disposition: A | Discharge status: Home / Self Care | Source: Ambulatory Visit | Attending: Unknown Physician Specialty | Admitting: Unknown Physician Specialty

## 2017-01-23 DIAGNOSIS — K7031 Alcoholic cirrhosis of liver with ascites: Secondary | ICD-10-CM | POA: Insufficient documentation

## 2017-01-23 LAB — SODIUM: SODIUM: 127 mmol/L — AB (ref 135–145)

## 2017-01-23 MED ORDER — ALBUMIN HUMAN 25 % IV SOLN
25.0000 g | Freq: Once | INTRAVENOUS | Status: AC
  Administered 2017-01-23: 25 g via INTRAVENOUS
  Filled 2017-01-23: qty 100

## 2017-01-23 NOTE — OR Nursing (Signed)
1145 skin warm to touch, temperature 99.8 oral. Encouraged to take fever reducer when gets home. (US tech stated he was warm to touch earlier prior to procedure)

## 2017-01-30 ENCOUNTER — Ambulatory Visit
Admission: RE | Admit: 2017-01-30 | Discharge: 2017-01-30 | Disposition: A | Discharge status: Home / Self Care | Source: Ambulatory Visit | Attending: Unknown Physician Specialty | Admitting: Unknown Physician Specialty

## 2017-01-30 DIAGNOSIS — K7031 Alcoholic cirrhosis of liver with ascites: Secondary | ICD-10-CM | POA: Diagnosis not present

## 2017-01-30 LAB — SODIUM: Sodium: 130 mmol/L — ABNORMAL LOW (ref 135–145)

## 2017-01-30 MED ORDER — ALBUMIN HUMAN 25 % IV SOLN
25.0000 g | Freq: Once | INTRAVENOUS | Status: AC
  Administered 2017-01-30: 25 g via INTRAVENOUS
  Filled 2017-01-30: qty 100

## 2017-01-30 NOTE — Procedures (Signed)
Successful US guided paracentesis. Removed 6 liters of milky yellow fluid.  Minimal blood loss.  No immediate complication.

## 2017-02-03 NOTE — Discharge Instructions (Signed)
Paracentesis, Care After  Refer to this sheet in the next few weeks. These instructions provide you with information about caring for yourself after your procedure. Your health care provider may also give you more specific instructions. Your treatment has been planned according to current medical practices, but problems sometimes occur. Call your health care provider if you have any problems or questions after your procedure.  What can I expect after the procedure?  After your procedure, it is common to have a small amount of clear fluid coming from the puncture site.  Follow these instructions at home:  · Return to your normal activities as told by your health care provider. Ask your health care provider what activities are safe for you.  · Take over-the-counter and prescription medicines only as told by your health care provider.  · Do not take baths, swim, or use a hot tub until your health care provider approves.  · Follow instructions from your health care provider about:  ? How to take care of your puncture site.  ? When and how you should change your bandage (dressing).  ? When you should remove your dressing.  · Check your puncture area every day signs of infection. Watch for:  ? Redness, swelling, or pain.  ? Fluid, blood, or pus.  · Keep all follow-up visits as told by your health care provider. This is important.  Contact a health care provider if:  · You have redness, swelling, or pain at your puncture site.  · You start to have more clear fluid coming from your puncture site.  · You have blood or pus coming from your puncture site.  · You have chills.  · You have a fever.  Get help right away if:  · You develop chest pain or shortness of breath.  · You develop increasing pain, discomfort, or swelling in your abdomen.  · You feel dizzy or light-headed or you pass out.  This information is not intended to replace advice given to you by your health care provider. Make sure you discuss any questions you  have with your health care provider.  Document Released: 03/13/2015 Document Revised: 04/03/2016 Document Reviewed: 01/09/2015  Elsevier Interactive Patient Education © 2017 [employer]

## 2017-02-06 ENCOUNTER — Ambulatory Visit
Admission: RE | Admit: 2017-02-06 | Discharge: 2017-02-06 | Disposition: A | Discharge status: Home / Self Care | Source: Ambulatory Visit | Attending: Unknown Physician Specialty | Admitting: Unknown Physician Specialty

## 2017-02-06 DIAGNOSIS — R188 Other ascites: Secondary | ICD-10-CM | POA: Insufficient documentation

## 2017-02-06 DIAGNOSIS — K7031 Alcoholic cirrhosis of liver with ascites: Secondary | ICD-10-CM

## 2017-02-06 LAB — SODIUM: Sodium: 131 mmol/L — ABNORMAL LOW (ref 135–145)

## 2017-02-06 MED ORDER — ALBUMIN HUMAN 25 % IV SOLN
25.0000 g | Freq: Once | INTRAVENOUS | Status: AC
  Administered 2017-02-06: 25 g via INTRAVENOUS
  Filled 2017-02-06: qty 100

## 2017-02-06 NOTE — Procedures (Signed)
Pre Procedural Dx: Symptomatic Ascites Post Procedural Dx: Same  Successful US guided paracentesis yielding 7 L of serous ascitic fluid. Sample sent to laboratory as requested.  EBL: None  Complications: None immediate  Katherina Right, MD Pager #: [phone]

## 2017-02-09 ENCOUNTER — Other Ambulatory Visit: Payer: Self-pay | Admitting: Unknown Physician Specialty

## 2017-02-09 DIAGNOSIS — K7031 Alcoholic cirrhosis of liver with ascites: Secondary | ICD-10-CM

## 2017-02-12 ENCOUNTER — Ambulatory Visit
Admission: RE | Admit: 2017-02-12 | Discharge: 2017-02-12 | Disposition: A | Discharge status: Home / Self Care | Source: Ambulatory Visit | Attending: Unknown Physician Specialty | Admitting: Unknown Physician Specialty

## 2017-02-12 DIAGNOSIS — K7031 Alcoholic cirrhosis of liver with ascites: Secondary | ICD-10-CM | POA: Insufficient documentation

## 2017-02-12 DIAGNOSIS — E860 Dehydration: Secondary | ICD-10-CM | POA: Diagnosis not present

## 2017-02-12 DIAGNOSIS — K7040 Alcoholic hepatic failure without coma: Secondary | ICD-10-CM | POA: Diagnosis not present

## 2017-02-12 LAB — SODIUM: Sodium: 128 mmol/L — ABNORMAL LOW (ref 135–145)

## 2017-02-12 MED ORDER — ALBUMIN HUMAN 25 % IV SOLN
25.0000 g | Freq: Once | INTRAVENOUS | Status: DC
  Filled 2017-02-12: qty 100

## 2017-02-13 ENCOUNTER — Ambulatory Visit: Admission: RE | Admit: 2017-02-13 | Source: Ambulatory Visit

## 2017-02-14 ENCOUNTER — Inpatient Hospital Stay
Admission: EM | Admit: 2017-02-14 | Discharge: 2017-02-19 | DRG: 432 | Disposition: A | Discharge status: Hospice | Attending: Internal Medicine | Admitting: Internal Medicine

## 2017-02-14 ENCOUNTER — Emergency Department

## 2017-02-14 ENCOUNTER — Encounter: Payer: Self-pay | Admitting: Specialist

## 2017-02-14 DIAGNOSIS — Z22322 Carrier or suspected carrier of Methicillin resistant Staphylococcus aureus: Secondary | ICD-10-CM

## 2017-02-14 DIAGNOSIS — N179 Acute kidney failure, unspecified: Secondary | ICD-10-CM | POA: Diagnosis present

## 2017-02-14 DIAGNOSIS — N5089 Other specified disorders of the male genital organs: Secondary | ICD-10-CM | POA: Diagnosis present

## 2017-02-14 DIAGNOSIS — E871 Hypo-osmolality and hyponatremia: Secondary | ICD-10-CM | POA: Diagnosis present

## 2017-02-14 DIAGNOSIS — R05 Cough: Secondary | ICD-10-CM

## 2017-02-14 DIAGNOSIS — K7040 Alcoholic hepatic failure without coma: Secondary | ICD-10-CM | POA: Diagnosis present

## 2017-02-14 DIAGNOSIS — E860 Dehydration: Secondary | ICD-10-CM | POA: Diagnosis present

## 2017-02-14 DIAGNOSIS — Z515 Encounter for palliative care: Secondary | ICD-10-CM | POA: Diagnosis not present

## 2017-02-14 DIAGNOSIS — R4182 Altered mental status, unspecified: Secondary | ICD-10-CM | POA: Diagnosis present

## 2017-02-14 DIAGNOSIS — I119 Hypertensive heart disease without heart failure: Secondary | ICD-10-CM | POA: Diagnosis present

## 2017-02-14 DIAGNOSIS — G9341 Metabolic encephalopathy: Secondary | ICD-10-CM | POA: Diagnosis present

## 2017-02-14 DIAGNOSIS — R410 Disorientation, unspecified: Secondary | ICD-10-CM

## 2017-02-14 DIAGNOSIS — J690 Pneumonitis due to inhalation of food and vomit: Secondary | ICD-10-CM | POA: Diagnosis not present

## 2017-02-14 DIAGNOSIS — R531 Weakness: Secondary | ICD-10-CM

## 2017-02-14 DIAGNOSIS — Z66 Do not resuscitate: Secondary | ICD-10-CM | POA: Diagnosis not present

## 2017-02-14 DIAGNOSIS — Z87891 Personal history of nicotine dependence: Secondary | ICD-10-CM

## 2017-02-14 DIAGNOSIS — Z79899 Other long term (current) drug therapy: Secondary | ICD-10-CM

## 2017-02-14 DIAGNOSIS — K429 Umbilical hernia without obstruction or gangrene: Secondary | ICD-10-CM | POA: Diagnosis present

## 2017-02-14 DIAGNOSIS — R1310 Dysphagia, unspecified: Secondary | ICD-10-CM | POA: Diagnosis not present

## 2017-02-14 DIAGNOSIS — Z8249 Family history of ischemic heart disease and other diseases of the circulatory system: Secondary | ICD-10-CM

## 2017-02-14 DIAGNOSIS — W19XXXA Unspecified fall, initial encounter: Secondary | ICD-10-CM | POA: Diagnosis present

## 2017-02-14 DIAGNOSIS — Z7189 Other specified counseling: Secondary | ICD-10-CM | POA: Diagnosis not present

## 2017-02-14 DIAGNOSIS — K219 Gastro-esophageal reflux disease without esophagitis: Secondary | ICD-10-CM | POA: Diagnosis present

## 2017-02-14 DIAGNOSIS — K7031 Alcoholic cirrhosis of liver with ascites: Secondary | ICD-10-CM | POA: Diagnosis not present

## 2017-02-14 DIAGNOSIS — M6282 Rhabdomyolysis: Secondary | ICD-10-CM | POA: Diagnosis present

## 2017-02-14 DIAGNOSIS — Z888 Allergy status to other drugs, medicaments and biological substances status: Secondary | ICD-10-CM

## 2017-02-14 DIAGNOSIS — F419 Anxiety disorder, unspecified: Secondary | ICD-10-CM | POA: Diagnosis present

## 2017-02-14 DIAGNOSIS — F329 Major depressive disorder, single episode, unspecified: Secondary | ICD-10-CM | POA: Diagnosis present

## 2017-02-14 DIAGNOSIS — Z9049 Acquired absence of other specified parts of digestive tract: Secondary | ICD-10-CM | POA: Diagnosis not present

## 2017-02-14 DIAGNOSIS — Z880 Allergy status to penicillin: Secondary | ICD-10-CM | POA: Diagnosis not present

## 2017-02-14 DIAGNOSIS — R059 Cough, unspecified: Secondary | ICD-10-CM

## 2017-02-14 LAB — URINALYSIS, COMPLETE (UACMP) WITH MICROSCOPIC
Bacteria, UA: NONE SEEN
Bilirubin Urine: NEGATIVE
GLUCOSE, UA: NEGATIVE mg/dL
HGB URINE DIPSTICK: NEGATIVE
Ketones, ur: NEGATIVE mg/dL
LEUKOCYTES UA: NEGATIVE
NITRITE: NEGATIVE
PH: 5 (ref 5.0–8.0)
Protein, ur: NEGATIVE mg/dL
RBC / HPF: NONE SEEN RBC/hpf (ref 0–5)
SPECIFIC GRAVITY, URINE: 1.014 (ref 1.005–1.030)

## 2017-02-14 LAB — LACTIC ACID, PLASMA
LACTIC ACID, VENOUS: 1.7 mmol/L (ref 0.5–1.9)
Lactic Acid, Venous: 2.1 mmol/L (ref 0.5–1.9)

## 2017-02-14 LAB — CBC
HCT: 27.2 % — ABNORMAL LOW (ref 40.0–52.0)
Hemoglobin: 9 g/dL — ABNORMAL LOW (ref 13.0–18.0)
MCH: 25.6 pg — ABNORMAL LOW (ref 26.0–34.0)
MCHC: 33 g/dL (ref 32.0–36.0)
MCV: 77.6 fL — ABNORMAL LOW (ref 80.0–100.0)
PLATELETS: 135 10*3/uL — AB (ref 150–440)
RBC: 3.5 MIL/uL — AB (ref 4.40–5.90)
RDW: 19 % — AB (ref 11.5–14.5)
WBC: 15.7 10*3/uL — AB (ref 3.8–10.6)

## 2017-02-14 LAB — BASIC METABOLIC PANEL
Anion gap: 6 (ref 5–15)
BUN: 30 mg/dL — AB (ref 6–20)
CALCIUM: 7.7 mg/dL — AB (ref 8.9–10.3)
CO2: 25 mmol/L (ref 22–32)
CREATININE: 1.99 mg/dL — AB (ref 0.61–1.24)
Chloride: 94 mmol/L — ABNORMAL LOW (ref 101–111)
GFR, EST AFRICAN AMERICAN: 38 mL/min — AB (ref >60)
GFR, EST NON AFRICAN AMERICAN: 33 mL/min — AB (ref >60)
Glucose, Bld: 89 mg/dL (ref 65–99)
Potassium: 4.2 mmol/L (ref 3.5–5.1)
SODIUM: 125 mmol/L — AB (ref 135–145)

## 2017-02-14 LAB — HEPATIC FUNCTION PANEL
ALK PHOS: 78 U/L (ref 38–126)
ALT: 24 U/L (ref 17–63)
AST: 102 U/L — ABNORMAL HIGH (ref 15–41)
Albumin: 2.5 g/dL — ABNORMAL LOW (ref 3.5–5.0)
BILIRUBIN INDIRECT: 1.6 mg/dL — AB (ref 0.3–0.9)
BILIRUBIN TOTAL: 2.7 mg/dL — AB (ref 0.3–1.2)
Bilirubin, Direct: 1.1 mg/dL — ABNORMAL HIGH (ref 0.1–0.5)
TOTAL PROTEIN: 5.6 g/dL — AB (ref 6.5–8.1)

## 2017-02-14 LAB — MRSA PCR SCREENING: MRSA by PCR: POSITIVE — AB

## 2017-02-14 LAB — PROTIME-INR
INR: 1.74
PROTHROMBIN TIME: 20.6 s — AB (ref 11.4–15.2)

## 2017-02-14 LAB — CK: CK TOTAL: 784 U/L — AB (ref 49–397)

## 2017-02-14 LAB — AMMONIA: Ammonia: 18 umol/L (ref 9–35)

## 2017-02-14 LAB — TROPONIN I: Troponin I: <0.03 ng/mL (ref <0.03)

## 2017-02-14 LAB — LIPASE, BLOOD: LIPASE: 20 U/L (ref 11–51)

## 2017-02-14 MED ORDER — SODIUM CHLORIDE 0.9 % IV BOLUS (SEPSIS)
1000.0000 mL | Freq: Once | INTRAVENOUS | Status: AC
Start: 2017-02-14 — End: 2017-02-14
  Administered 2017-02-14: 1000 mL via INTRAVENOUS

## 2017-02-14 MED ORDER — ONDANSETRON HCL 4 MG PO TABS: 4.0000 mg | ORAL_TABLET | Freq: Four times a day (QID) | ORAL | Status: DC | PRN

## 2017-02-14 MED ORDER — ONDANSETRON HCL 4 MG/2ML IJ SOLN: 4.0000 mg | Freq: Four times a day (QID) | INTRAMUSCULAR | Status: DC | PRN

## 2017-02-14 MED ORDER — ALBUMIN HUMAN 25 % IV SOLN
12.5000 g | Freq: Once | INTRAVENOUS | Status: AC
  Administered 2017-02-14: 23:00:00 12.5 g via INTRAVENOUS
  Filled 2017-02-14: qty 50

## 2017-02-14 MED ORDER — SODIUM CHLORIDE 0.9 % IV BOLUS (SEPSIS): 500.0000 mL | Freq: Once | INTRAVENOUS | Status: DC

## 2017-02-14 MED ORDER — ACETAMINOPHEN 325 MG PO TABS
650.0000 mg | ORAL_TABLET | Freq: Four times a day (QID) | ORAL | Status: DC | PRN
  Administered 2017-02-18: 650 mg via ORAL
  Filled 2017-02-14: qty 2

## 2017-02-14 MED ORDER — PANTOPRAZOLE SODIUM 40 MG PO TBEC
40.0000 mg | DELAYED_RELEASE_TABLET | Freq: Every day | ORAL | Status: DC
  Filled 2017-02-14: qty 1

## 2017-02-14 MED ORDER — NADOLOL 20 MG PO TABS
20.0000 mg | ORAL_TABLET | Freq: Every day | ORAL | Status: DC
  Administered 2017-02-17 – 2017-02-18 (×2): 20 mg via ORAL
  Filled 2017-02-14 (×4): qty 1

## 2017-02-14 MED ORDER — LACTULOSE 10 GM/15ML PO SOLN
30.0000 g | Freq: Two times a day (BID) | ORAL | Status: DC
  Administered 2017-02-15: 22:00:00 30 g via ORAL
  Filled 2017-02-14 (×2): qty 60

## 2017-02-14 MED ORDER — MECLIZINE HCL 25 MG PO TABS
25.0000 mg | ORAL_TABLET | Freq: Once | ORAL | Status: DC
  Filled 2017-02-14: qty 1

## 2017-02-14 MED ORDER — FUROSEMIDE 10 MG/ML IJ SOLN
40.0000 mg | Freq: Once | INTRAMUSCULAR | Status: AC
  Administered 2017-02-14: 40 mg via INTRAVENOUS
  Filled 2017-02-14: qty 4

## 2017-02-14 MED ORDER — ENSURE ENLIVE PO LIQD
237.0000 mL | Freq: Two times a day (BID) | ORAL | Status: DC
  Administered 2017-02-15: 237 mL via ORAL

## 2017-02-14 MED ORDER — HEPARIN SODIUM (PORCINE) 5000 UNIT/ML IJ SOLN
5000.0000 [IU] | Freq: Three times a day (TID) | INTRAMUSCULAR | Status: DC
  Administered 2017-02-14 – 2017-02-18 (×10): 5000 [IU] via SUBCUTANEOUS
  Filled 2017-02-14 (×10): qty 1

## 2017-02-14 MED ORDER — SODIUM CHLORIDE 0.9 % IV SOLN: INTRAVENOUS | Status: DC

## 2017-02-14 MED ORDER — ACETAMINOPHEN 650 MG RE SUPP
650.0000 mg | Freq: Four times a day (QID) | RECTAL | Status: DC | PRN
  Administered 2017-02-15: 650 mg via RECTAL
  Filled 2017-02-14: qty 1

## 2017-02-14 MED ORDER — SIMETHICONE 80 MG PO CHEW
160.0000 mg | CHEWABLE_TABLET | Freq: Four times a day (QID) | ORAL | Status: DC | PRN
  Administered 2017-02-17: 18:00:00 240 mg via ORAL
  Administered 2017-02-17: 160 mg via ORAL
  Filled 2017-02-14: qty 2
  Filled 2017-02-14: qty 3

## 2017-02-14 NOTE — ED Provider Notes (Signed)
[facility] Emergency Department Provider Note  ____________________________________________   First MD Initiated Contact with Patient 02/14/17 1328     (approximate)  I have reviewed the triage vital signs and the nursing notes.   HISTORY  Chief Complaint Weakness  EM caveat: Patient cannot recall events of the last few days  HPI Jeff Preston is a 71 y.o. male found down at home, last seen normal on Thursday where he seemed a little bit weak and tired according to his family member/sister. He had his therapeutic paracentesis and then went home.  Patient was found down in his home, suspected he's been laying on the ground for up to 2 days according to his family  Patient reports he doesn't recall what happened, he is unclear on this. He is slightly disoriented making history unreliable, but he reports not being in any pain. Denies any fever or trouble breathing. He just reports he feels weak and very tired.     Past Medical History:  Diagnosis Date  . Anxiety   . Anxiety   . Ascites   . Chronic hyponatremia   . Cirrhosis (HCC)   . Depression   . DNR (do not resuscitate)   . GERD (gastroesophageal reflux disease)   . Hypertension   . Umbilical hernia     Patient Active Problem List   Diagnosis Date Noted  . Depression 09/23/2016  . Benzodiazepine withdrawal (HCC) 09/22/2016  . Palliative care by specialist   . DNR (do not resuscitate)   . Hepatic cirrhosis (HCC) 04/28/2016  . Hyponatremia 04/26/2016  . Umbilical hernia with gangrene   . Open wound of umbilical region   . Ascites 03/12/2016    Past Surgical History:  Procedure Laterality Date  . CHOLECYSTECTOMY    . COLON SURGERY    . COLOSTOMY N/A   . THROAT SURGERY      Prior to Admission medications   Medication Sig Start Date End Date Taking? Authorizing Provider  bisacodyl (DULCOLAX) 5 MG EC tablet Take 1 tablet (5 mg total) by mouth daily as needed for moderate  constipation. 03/16/16  Yes Ramonita Lab, MD  furosemide (LASIX) 40 MG tablet Take 1 tablet (40 mg total) by mouth daily. 05/01/16  Yes Srikar Sudini, MD  lactulose (CHRONULAC) 10 GM/15ML solution Take 15 mLs (10 g total) by mouth daily. 09/06/16  Yes Sharman Cheek, MD  LORazepam (ATIVAN) 1 MG tablet Take 1 tablet (1 mg total) by mouth 3 (three) times daily. 09/24/16  Yes Katha Hamming, MD  nadolol (CORGARD) 20 MG tablet Take 20 mg by mouth daily.   Yes Historical Provider, MD  omeprazole (PRILOSEC) 20 MG capsule Take 20 mg by mouth 2 (two) times daily.    Yes Historical Provider, MD  potassium chloride SA (K-DUR,KLOR-CON) 20 MEQ tablet Take 20 mEq by mouth 2 (two) times daily. 05/28/16  Yes Historical Provider, MD  simethicone (MYLICON) 80 MG chewable tablet Chew 160-240 mg by mouth every 6 (six) hours as needed for flatulence.    Yes Historical Provider, MD  sodium chloride 1 g tablet Take 1 tablet (1 g total) by mouth daily. 05/01/16  Yes Srikar Sudini, MD  spironolactone (ALDACTONE) 50 MG tablet Take 50 mg by mouth daily. 04/14/16  Yes Historical Provider, MD  chlordiazePOXIDE (LIBRIUM) 25 MG capsule Take 1 capsule (25 mg total) by mouth 3 (three) times daily as needed for anxiety. 50 mg 3 times a day x 3 days 50 mg 2 times a day  for 3 days 50 mg once a day for 3 days stop  Total of 36 pills Patient not taking: Reported on 02/14/2017 10/25/16   Willy Eddy, MD  chlordiazePOXIDE (LIBRIUM) 25 MG capsule Take 1 capsule (25 mg total) by mouth 3 (three) times daily as needed for anxiety. Patient not taking: Reported on 02/14/2017 10/25/16   Willy Eddy, MD  feeding supplement, ENSURE ENLIVE, (ENSURE ENLIVE) LIQD Take 237 mLs by mouth 2 (two) times daily between meals. 09/24/16   Katha Hamming, MD  mirtazapine (REMERON) 15 MG tablet Take 1 tablet (15 mg total) by mouth at bedtime. Patient not taking: Reported on 02/14/2017 09/24/16   Katha Hamming, MD  traMADol (ULTRAM) 50 MG  tablet Take 1 tablet (50 mg total) by mouth every 6 (six) hours as needed for moderate pain or severe pain. Patient not taking: Reported on 02/14/2017 05/01/16   Milagros Loll, MD    Allergies Dilantin [phenytoin sodium extended] and Penicillins  Family History  Problem Relation Age of Onset  . CAD Mother     Social History Social History  Substance Use Topics  . Smoking status: Former Games developer  . Smokeless tobacco: Former Neurosurgeon    Quit date: 07/04/2016  . Alcohol use No   Review of Systems EM caveat: See history of present illness  ____________________________________________   PHYSICAL EXAM:  VITAL SIGNS: ED Triage Vitals [02/14/17 1310]  Enc Vitals Group     BP (!) 108/51     Pulse Rate 83     Resp 18     Temp 98.1 F (36.7 C)     Temp Source Oral     SpO2 100 %     Weight      Height      Head Circumference      Peak Flow      Pain Score      Pain Loc      Pain Edu?      Excl. in GC?     Constitutional: Alert and orientedTo self but not to date or situation well. Thehospital. Eyes: Conjunctivae are normal. PERRL. EOMI. Head: Atraumatic. Nose: No congestion/rhinnorhea. Mouth/Throat: Mucous membranes are extremely dry  Oropharynx non-erythematous. Neck: No stridor.   Cardiovascular: Normal rate, regular rhythm. Grossly normal heart sounds.  Good peripheral circulation. Respiratory: Normal respiratory effort.  No retractions. Lungs CTAB. Gastrointestinal: Soft and nontender. Mildly distended consistent with mild ascites. He is a large soft umbilical hernia. In addition he has a very swollen scrotum and penis, but he and his sister reports this is unchanged from previous. Denies pain in the scrotum or perineum buttock area.  Musculoskeletal: No lower extremity tenderness No joint effusions. Neurologic:  Normal speech and language. No gross focal neurologic deficits are appreciated. Moves all extremities with about 45 strength. Skin:  Skin is warm, dry and intact.  No rash noted. Psychiatric: Mood and affect are normal. Speech and behavior are normal.  ____________________________________________   LABS (all labs ordered are listed, but only abnormal results are displayed)  Labs Reviewed  BASIC METABOLIC PANEL - Abnormal; Notable for the following:       Result Value   Sodium 125 (*)    Chloride 94 (*)    BUN 30 (*)    Creatinine, Ser 1.99 (*)    Calcium 7.7 (*)    GFR calc non Af Amer 33 (*)    GFR calc Af Amer 38 (*)    All other components within normal limits  CBC -  Abnormal; Notable for the following:    WBC 15.7 (*)    RBC 3.50 (*)    Hemoglobin 9.0 (*)    HCT 27.2 (*)    MCV 77.6 (*)    MCH 25.6 (*)    RDW 19.0 (*)    Platelets 135 (*)    All other components within normal limits  URINALYSIS, COMPLETE (UACMP) WITH MICROSCOPIC - Abnormal; Notable for the following:    Color, Urine AMBER (*)    APPearance HAZY (*)    Squamous Epithelial / LPF 0-5 (*)    All other components within normal limits  CK - Abnormal; Notable for the following:    Total CK 784 (*)    All other components within normal limits  LACTIC ACID, PLASMA - Abnormal; Notable for the following:    Lactic Acid, Venous 2.1 (*)    All other components within normal limits  PROTIME-INR - Abnormal; Notable for the following:    Prothrombin Time 20.6 (*)    All other components within normal limits  CULTURE, BLOOD (ROUTINE X 2)  CULTURE, BLOOD (ROUTINE X 2)  URINE CULTURE  TROPONIN I  AMMONIA  LACTIC ACID, PLASMA  HEPATIC FUNCTION PANEL  LIPASE, BLOOD   ____________________________________________  EKG  Reviewed and interpreted by me at 1330 Heart rate 75 QRS 120 QTc 460 Some slight artifact, normal sinus rhythm without ischemia or ectopy noted although an occasional PAC versus as arrhythmia may be pregnant ____________________________________________  RADIOLOGY  Ct Head Wo Contrast  Result Date: 02/14/2017 CLINICAL DATA:  Fall, found down. EXAM:  CT HEAD WITHOUT CONTRAST CT CERVICAL SPINE WITHOUT CONTRAST TECHNIQUE: Multidetector CT imaging of the head and cervical spine was performed following the standard protocol without intravenous contrast. Multiplanar CT image reconstructions of the cervical spine were also generated. COMPARISON:  Head CT dated 09/22/2016. FINDINGS: CT HEAD FINDINGS Brain: Mild generalized parenchymal atrophy with commensurate dilatation of the ventricles and sulci. No mass, hemorrhage, edema or other evidence of acute parenchymal abnormality. No extra-axial hemorrhage. Vascular: There are chronic calcified atherosclerotic changes of the large vessels at the skull base. No unexpected hyperdense vessel. Skull: Normal. Negative for fracture or focal lesion. Sinuses/Orbits: No acute finding. Other: None. CT CERVICAL SPINE FINDINGS Alignment: Mild levoscoliosis of the lower cervical spine. No evidence of acute vertebral body subluxation. Skull base and vertebrae: No fracture line or displaced fracture fragment. Facet joints appear intact and normally aligned throughout. Soft tissues and spinal canal: No prevertebral fluid or swelling. No visible canal hematoma. Disc levels: Mild degenerative spurring within the mid and lower cervical spine, associated mild disc-osteophytic bulge at C5-6. No more than mild central canal stenosis at any level. Upper chest: No acute findings. Other: Carotid atherosclerosis. IMPRESSION: 1. No acute intracranial abnormality. No intracranial mass, hemorrhage or edema. No skull fracture. 2. No fracture or acute subluxation within the cervical spine. Mild degenerative change within the mid and lower cervical spine. Mild scoliosis. 3. Carotid atherosclerosis. Electronically Signed   By: Bary Richard M.D.   On: 02/14/2017 14:22   Ct Cervical Spine Wo Contrast  Result Date: 02/14/2017 CLINICAL DATA:  Fall, found down. EXAM: CT HEAD WITHOUT CONTRAST CT CERVICAL SPINE WITHOUT CONTRAST TECHNIQUE: Multidetector CT  imaging of the head and cervical spine was performed following the standard protocol without intravenous contrast. Multiplanar CT image reconstructions of the cervical spine were also generated. COMPARISON:  Head CT dated 09/22/2016. FINDINGS: CT HEAD FINDINGS Brain: Mild generalized parenchymal atrophy with commensurate dilatation of  the ventricles and sulci. No mass, hemorrhage, edema or other evidence of acute parenchymal abnormality. No extra-axial hemorrhage. Vascular: There are chronic calcified atherosclerotic changes of the large vessels at the skull base. No unexpected hyperdense vessel. Skull: Normal. Negative for fracture or focal lesion. Sinuses/Orbits: No acute finding. Other: None. CT CERVICAL SPINE FINDINGS Alignment: Mild levoscoliosis of the lower cervical spine. No evidence of acute vertebral body subluxation. Skull base and vertebrae: No fracture line or displaced fracture fragment. Facet joints appear intact and normally aligned throughout. Soft tissues and spinal canal: No prevertebral fluid or swelling. No visible canal hematoma. Disc levels: Mild degenerative spurring within the mid and lower cervical spine, associated mild disc-osteophytic bulge at C5-6. No more than mild central canal stenosis at any level. Upper chest: No acute findings. Other: Carotid atherosclerosis. IMPRESSION: 1. No acute intracranial abnormality. No intracranial mass, hemorrhage or edema. No skull fracture. 2. No fracture or acute subluxation within the cervical spine. Mild degenerative change within the mid and lower cervical spine. Mild scoliosis. 3. Carotid atherosclerosis. Electronically Signed   By: Bary Richard M.D.   On: 02/14/2017 14:22   Dg Chest Portable 1 View  Result Date: 02/14/2017 CLINICAL DATA:  Fall at home. EXAM: PORTABLE CHEST 1 VIEW COMPARISON:  10/25/2016 FINDINGS: Lungs are adequately inflated without focal consolidation or effusion. Borderline stable cardiomegaly. Bones and soft tissues are  unchanged. IMPRESSION: No active disease. Electronically Signed   By: Elberta Fortis M.D.   On: 02/14/2017 13:55    ____________________________________________   PROCEDURES  Procedure(s) performed: None  Procedures  Critical Care performed: No  ____________________________________________   INITIAL IMPRESSION / ASSESSMENT AND PLAN / ED COURSE  Pertinent labs & imaging results that were available during my care of the patient were reviewed by me and considered in my medical decision making (see chart for details).  Patient found down on the ground for about 2 days. Etiology somewhat unclear. Hemodynamically stable here, but slightly confused and disoriented. CT scan does not demonstrate acute neurologic process and his exam does not show any focal neurologic deficit. He denies any cardiac or respiratory symptoms. CK is elevated and he has obvious acute kidney injury. It is somewhat unclear what the precipitating event this. Denies abdominal pain, of note is very difficult to get a urinalysis on him because of how swollen his.  ----------------------------------------- 3:50 PM on 02/14/2017 -----------------------------------------  Case discussed with hospitalist. Patient will be admitted, further workup. Discussed briefly with Dr. Lubertha South, and we'll defer antibiotics. The patient does not have a fever, and his workup thus far does not demonstrate obvious acute infectious etiology      ____________________________________________   FINAL CLINICAL IMPRESSION(S) / ED DIAGNOSES  Final diagnoses:  Confusion  General weakness  AKI (acute kidney injury) (HCC)  Dehydration      NEW MEDICATIONS STARTED DURING THIS VISIT:  New Prescriptions   No medications on file     Note:  This document was prepared using Dragon voice recognition software and may include unintentional dictation errors.     Sharyn Creamer, MD 02/14/17 [phone]

## 2017-02-14 NOTE — H&P (Signed)
[employer] [facility] [place] [facility]   PATIENT NAME: Jeff Preston    MR#:  [id number]  DATE OF BIRTH:  1946/04/04  DATE OF ADMISSION:  02/14/2017  PRIMARY CARE PHYSICIAN: [facility]   REQUESTING/REFERRING PHYSICIAN: Dr. Sharyn Creamer  CHIEF COMPLAINT:   Chief Complaint  Patient presents with  . Weakness    HISTORY OF PRESENT ILLNESS:  Taelor Moncada  is a 71 y.o. male with a known history of Alcoholic liver disease, liver cirrhosis, GERD, hypertension, ascites status post recurrent need for paracentesis who presented to the hospital after found by his son on the floor between his bedroom in his bathroom. Patient himself is a very poor historian and therefore most history obtained from the sister over the phone. As per the sister patient has become increasingly weak over the past few days. His appetite has been poor. Today he was more disoriented and found by his son between his bedroom in his bathroom on the floor and therefore brought to the ER for further evaluation. In the emergency room patient underwent routine workup which showed no evidence of infectious source, as ammonia was only mildly elevated but he remains altered and lethargic and therefore hospitalist services were contacted further treatment and evaluation.  PAST MEDICAL HISTORY:   Past Medical History:  Diagnosis Date  . Anxiety   . Anxiety   . Ascites   . Chronic hyponatremia   . Cirrhosis (HCC)   . Depression   . DNR (do not resuscitate)   . GERD (gastroesophageal reflux disease)   . Hypertension   . Umbilical hernia     PAST SURGICAL HISTORY:   Past Surgical History:  Procedure Laterality Date  . CHOLECYSTECTOMY    . COLON SURGERY    . COLOSTOMY N/A   . THROAT SURGERY      SOCIAL HISTORY:   Social History  Substance Use Topics  . Smoking status: Former Games developer  . Smokeless tobacco: Former Neurosurgeon    Quit date: 07/04/2016  . Alcohol use No    FAMILY HISTORY:   Family  History  Problem Relation Age of Onset  . CAD Mother   . Heart attack Mother   . Alzheimer's disease Father     DRUG ALLERGIES:   Allergies  Allergen Reactions  . Dilantin [Phenytoin Sodium Extended] Rash  . Penicillins Rash and Other (See Comments)    Has patient had a PCN reaction causing immediate rash, facial/tongue/throat swelling, SOB or lightheadedness with hypotension: No Has patient had a PCN reaction causing severe rash involving mucus membranes or skin necrosis: No Has patient had a PCN reaction that required hospitalization No Has patient had a PCN reaction occurring within the last 10 years: No If all of the above answers are "NO", then may proceed with Cephalosporin use.    REVIEW OF SYSTEMS:   Review of Systems  Unable to perform ROS: Mental acuity    MEDICATIONS AT HOME:   Prior to Admission medications   Medication Sig Start Date End Date Taking? Authorizing Provider  bisacodyl (DULCOLAX) 5 MG EC tablet Take 1 tablet (5 mg total) by mouth daily as needed for moderate constipation. 03/16/16  Yes Ramonita Lab, MD  furosemide (LASIX) 40 MG tablet Take 1 tablet (40 mg total) by mouth daily. 05/01/16  Yes Srikar Sudini, MD  lactulose (CHRONULAC) 10 GM/15ML solution Take 15 mLs (10 g total) by mouth daily. 09/06/16  Yes Sharman Cheek, MD  LORazepam (ATIVAN) 1 MG tablet Take 1  tablet (1 mg total) by mouth 3 (three) times daily. 09/24/16  Yes Katha Hamming, MD  nadolol (CORGARD) 20 MG tablet Take 20 mg by mouth daily.   Yes Historical Provider, MD  omeprazole (PRILOSEC) 20 MG capsule Take 20 mg by mouth 2 (two) times daily.    Yes Historical Provider, MD  potassium chloride SA (K-DUR,KLOR-CON) 20 MEQ tablet Take 20 mEq by mouth 2 (two) times daily. 05/28/16  Yes Historical Provider, MD  simethicone (MYLICON) 80 MG chewable tablet Chew 160-240 mg by mouth every 6 (six) hours as needed for flatulence.    Yes Historical Provider, MD  sodium chloride 1 g tablet Take 1  tablet (1 g total) by mouth daily. 05/01/16  Yes Srikar Sudini, MD  spironolactone (ALDACTONE) 50 MG tablet Take 50 mg by mouth daily. 04/14/16  Yes Historical Provider, MD  chlordiazePOXIDE (LIBRIUM) 25 MG capsule Take 1 capsule (25 mg total) by mouth 3 (three) times daily as needed for anxiety. 50 mg 3 times a day x 3 days 50 mg 2 times a day for 3 days 50 mg once a day for 3 days stop  Total of 36 pills Patient not taking: Reported on 02/14/2017 10/25/16   Willy Eddy, MD  chlordiazePOXIDE (LIBRIUM) 25 MG capsule Take 1 capsule (25 mg total) by mouth 3 (three) times daily as needed for anxiety. Patient not taking: Reported on 02/14/2017 10/25/16   Willy Eddy, MD  feeding supplement, ENSURE ENLIVE, (ENSURE ENLIVE) LIQD Take 237 mLs by mouth 2 (two) times daily between meals. 09/24/16   Katha Hamming, MD  mirtazapine (REMERON) 15 MG tablet Take 1 tablet (15 mg total) by mouth at bedtime. Patient not taking: Reported on 02/14/2017 09/24/16   Katha Hamming, MD  traMADol (ULTRAM) 50 MG tablet Take 1 tablet (50 mg total) by mouth every 6 (six) hours as needed for moderate pain or severe pain. Patient not taking: Reported on 02/14/2017 05/01/16   Milagros Loll, MD      VITAL SIGNS:  Blood pressure (!) 98/53, pulse 76, temperature 98.1 F (36.7 C), temperature source Oral, resp. rate 19, SpO2 100 %.  PHYSICAL EXAMINATION:  Physical Exam  GENERAL:  71 y.o.-year-old patient lying in bed Encephalopathic but in NAD.  EYES: Pupils equal, round, reactive to light and accommodation. No scleral icterus. Extraocular muscles intact.  HEENT: Head atraumatic, normocephalic. Oropharynx and nasopharynx clear. No oropharyngeal erythema, dry oral mucosa  NECK:  Supple, no jugular venous distention. No thyroid enlargement, no tenderness.  LUNGS: Poor Resp. effort, no wheezing, rales, rhonchi. No use of accessory muscles of respiration.  CARDIOVASCULAR: S1, S2 RRR. No murmurs, rubs, gallops,  clicks.  ABDOMEN: Soft, nontender, nondistended. Bowel sounds present. No organomegaly or mass. + umbilical Hernia  EXTREMITIES: +2 edema b/l, cyanosis, or clubbing. + 2 pedal & radial pulses b/l.   NEUROLOGIC: Cranial nerves II through XII are intact. No focal Motor or sensory deficits appreciated b/l. Globally weak.   PSYCHIATRIC: The patient is alert and oriented x 1.   SKIN: No obvious rash, lesion, or ulcer.   GU - significant scrotal enlargement.    LABORATORY PANEL:   CBC  Recent Labs Lab 02/14/17 1411  WBC 15.7*  HGB 9.0*  HCT 27.2*  PLT 135*   ------------------------------------------------------------------------------------------------------------------  Chemistries   Recent Labs Lab 02/14/17 1411  NA 125*  K 4.2  CL 94*  CO2 25  GLUCOSE 89  BUN 30*  CREATININE 1.99*  CALCIUM 7.7*  AST 102*  ALT 24  ALKPHOS 78  BILITOT 2.7*   ------------------------------------------------------------------------------------------------------------------  Cardiac Enzymes  Recent Labs Lab 02/14/17 1411  TROPONINI <0.03   ------------------------------------------------------------------------------------------------------------------  RADIOLOGY:  Ct Head Wo Contrast  Result Date: 02/14/2017 CLINICAL DATA:  Fall, found down. EXAM: CT HEAD WITHOUT CONTRAST CT CERVICAL SPINE WITHOUT CONTRAST TECHNIQUE: Multidetector CT imaging of the head and cervical spine was performed following the standard protocol without intravenous contrast. Multiplanar CT image reconstructions of the cervical spine were also generated. COMPARISON:  Head CT dated 09/22/2016. FINDINGS: CT HEAD FINDINGS Brain: Mild generalized parenchymal atrophy with commensurate dilatation of the ventricles and sulci. No mass, hemorrhage, edema or other evidence of acute parenchymal abnormality. No extra-axial hemorrhage. Vascular: There are chronic calcified atherosclerotic changes of the large vessels at the  skull base. No unexpected hyperdense vessel. Skull: Normal. Negative for fracture or focal lesion. Sinuses/Orbits: No acute finding. Other: None. CT CERVICAL SPINE FINDINGS Alignment: Mild levoscoliosis of the lower cervical spine. No evidence of acute vertebral body subluxation. Skull base and vertebrae: No fracture line or displaced fracture fragment. Facet joints appear intact and normally aligned throughout. Soft tissues and spinal canal: No prevertebral fluid or swelling. No visible canal hematoma. Disc levels: Mild degenerative spurring within the mid and lower cervical spine, associated mild disc-osteophytic bulge at C5-6. No more than mild central canal stenosis at any level. Upper chest: No acute findings. Other: Carotid atherosclerosis. IMPRESSION: 1. No acute intracranial abnormality. No intracranial mass, hemorrhage or edema. No skull fracture. 2. No fracture or acute subluxation within the cervical spine. Mild degenerative change within the mid and lower cervical spine. Mild scoliosis. 3. Carotid atherosclerosis. Electronically Signed   By: Bary Richard M.D.   On: 02/14/2017 14:22   Ct Cervical Spine Wo Contrast  Result Date: 02/14/2017 CLINICAL DATA:  Fall, found down. EXAM: CT HEAD WITHOUT CONTRAST CT CERVICAL SPINE WITHOUT CONTRAST TECHNIQUE: Multidetector CT imaging of the head and cervical spine was performed following the standard protocol without intravenous contrast. Multiplanar CT image reconstructions of the cervical spine were also generated. COMPARISON:  Head CT dated 09/22/2016. FINDINGS: CT HEAD FINDINGS Brain: Mild generalized parenchymal atrophy with commensurate dilatation of the ventricles and sulci. No mass, hemorrhage, edema or other evidence of acute parenchymal abnormality. No extra-axial hemorrhage. Vascular: There are chronic calcified atherosclerotic changes of the large vessels at the skull base. No unexpected hyperdense vessel. Skull: Normal. Negative for fracture or focal  lesion. Sinuses/Orbits: No acute finding. Other: None. CT CERVICAL SPINE FINDINGS Alignment: Mild levoscoliosis of the lower cervical spine. No evidence of acute vertebral body subluxation. Skull base and vertebrae: No fracture line or displaced fracture fragment. Facet joints appear intact and normally aligned throughout. Soft tissues and spinal canal: No prevertebral fluid or swelling. No visible canal hematoma. Disc levels: Mild degenerative spurring within the mid and lower cervical spine, associated mild disc-osteophytic bulge at C5-6. No more than mild central canal stenosis at any level. Upper chest: No acute findings. Other: Carotid atherosclerosis. IMPRESSION: 1. No acute intracranial abnormality. No intracranial mass, hemorrhage or edema. No skull fracture. 2. No fracture or acute subluxation within the cervical spine. Mild degenerative change within the mid and lower cervical spine. Mild scoliosis. 3. Carotid atherosclerosis. Electronically Signed   By: Bary Richard M.D.   On: 02/14/2017 14:22   Dg Chest Portable 1 View  Result Date: 02/14/2017 CLINICAL DATA:  Fall at home. EXAM: PORTABLE CHEST 1 VIEW COMPARISON:  10/25/2016 FINDINGS: Lungs are adequately inflated without focal consolidation or effusion. Borderline stable  cardiomegaly. Bones and soft tissues are unchanged. IMPRESSION: No active disease. Electronically Signed   By: Elberta Fortis M.D.   On: 02/14/2017 13:55     IMPRESSION AND PLAN:   71 year old male with past medical history of alcoholic liver disease with liver cirrhosis, recurrent ascites, chronic scrotal enlargement, GERD, who presented to the hospital due to altered mental status and being found down on the floor between the bathroom and has bedroom.  1. Altered mental status-etiology unclear. Patient's ammonia level is normal but he appears encephalopathic. -Chest x-ray, urinalysis is negative for any acute infectious pathology. Likely metabolic encephalopathy due to  underlying liver disease with hepatic encephalopathy and also mild hyponatremia. -We'll place the patient on IV fluids, give him lactulose and follow his mental status. Patient CT head and cervical spine is negative for acute pathology.  2. Rhabdomyolysis-mild traumatic secondary to his fall at home. [facility] place on gentle IV fluids, follow CKs.  3. Generalized weakness status post fall-we'll get a physical therapy consult to assess his mobility.  4. Hyponatremia-secondary to his chronic liver disease-we'll place on gentle IV fluids, follow sodium.  5. History of liver cirrhosis-continue lactulose for hepatic encephalopathy, continue nadolol.   -  Cont. Protonix.   - Prognosis is poor and will get Palliative Care consult for goals of care.  Apparently is followed by Hospice.      All the records are reviewed and case discussed with ED provider. Management plans discussed with the patient, family and they are in agreement.  CODE STATUS: Full code  TOTAL TIME TAKING CARE OF THIS PATIENT: 45 minutes.    Houston Siren M.D on 02/14/2017 at 4:19 PM  Between 7am to 6pm - Pager - [phone]  After 6pm go to [email or web address] - password [email or web address] [facility]  [facility]  Office  [phone]  CC: Primary care physician; [facility]

## 2017-02-14 NOTE — ED Triage Notes (Signed)
Pt presents via EMS. Pt reportedly fell at home and was unable to get up. Unknown time of date of fall. Last spoken to family 3 days ago and unable to reach pt yesterday per EMS report. Pt A&O but mumbling when answering question. Noted dry, cracked lips.

## 2017-02-15 ENCOUNTER — Inpatient Hospital Stay

## 2017-02-15 LAB — BLOOD GAS, ARTERIAL
ACID-BASE DEFICIT: 3 mmol/L — AB (ref 0.0–2.0)
Bicarbonate: 22.7 mmol/L (ref 20.0–28.0)
FIO2: 1
O2 Saturation: 93.5 %
PATIENT TEMPERATURE: 37
PH ART: 7.34 — AB (ref 7.350–7.450)
pCO2 arterial: 42 mmHg (ref 32.0–48.0)
pO2, Arterial: 73 mmHg — ABNORMAL LOW (ref 83.0–108.0)

## 2017-02-15 LAB — URINE CULTURE
Culture: <10000 — AB
SPECIAL REQUESTS: NORMAL

## 2017-02-15 LAB — CBC
HCT: 25.1 % — ABNORMAL LOW (ref 40.0–52.0)
HEMOGLOBIN: 8.2 g/dL — AB (ref 13.0–18.0)
MCH: 25.3 pg — AB (ref 26.0–34.0)
MCHC: 32.8 g/dL (ref 32.0–36.0)
MCV: 77 fL — ABNORMAL LOW (ref 80.0–100.0)
PLATELETS: 135 10*3/uL — AB (ref 150–440)
RBC: 3.26 MIL/uL — ABNORMAL LOW (ref 4.40–5.90)
RDW: 18.8 % — AB (ref 11.5–14.5)
WBC: 11.7 10*3/uL — ABNORMAL HIGH (ref 3.8–10.6)

## 2017-02-15 LAB — BLOOD CULTURE ID PANEL (REFLEXED)
ACINETOBACTER BAUMANNII: NOT DETECTED
CANDIDA ALBICANS: NOT DETECTED
CANDIDA GLABRATA: NOT DETECTED
CANDIDA KRUSEI: NOT DETECTED
Candida parapsilosis: NOT DETECTED
Candida tropicalis: NOT DETECTED
ENTEROBACTER CLOACAE COMPLEX: NOT DETECTED
ENTEROBACTERIACEAE SPECIES: NOT DETECTED
ENTEROCOCCUS SPECIES: NOT DETECTED
ESCHERICHIA COLI: NOT DETECTED
Haemophilus influenzae: NOT DETECTED
Klebsiella oxytoca: NOT DETECTED
Klebsiella pneumoniae: NOT DETECTED
LISTERIA MONOCYTOGENES: NOT DETECTED
Methicillin resistance: DETECTED — AB
NEISSERIA MENINGITIDIS: NOT DETECTED
PROTEUS SPECIES: NOT DETECTED
PSEUDOMONAS AERUGINOSA: NOT DETECTED
STREPTOCOCCUS AGALACTIAE: NOT DETECTED
STREPTOCOCCUS PYOGENES: NOT DETECTED
STREPTOCOCCUS SPECIES: NOT DETECTED
Serratia marcescens: NOT DETECTED
Staphylococcus aureus (BCID): NOT DETECTED
Staphylococcus species: DETECTED — AB
Streptococcus pneumoniae: NOT DETECTED

## 2017-02-15 LAB — COMPREHENSIVE METABOLIC PANEL
ALBUMIN: 2.5 g/dL — AB (ref 3.5–5.0)
ALK PHOS: 75 U/L (ref 38–126)
ALT: 22 U/L (ref 17–63)
AST: 71 U/L — AB (ref 15–41)
Anion gap: 8 (ref 5–15)
BILIRUBIN TOTAL: 2.7 mg/dL — AB (ref 0.3–1.2)
BUN: 34 mg/dL — AB (ref 6–20)
CALCIUM: 7.4 mg/dL — AB (ref 8.9–10.3)
CO2: 24 mmol/L (ref 22–32)
CREATININE: 1.71 mg/dL — AB (ref 0.61–1.24)
Chloride: 97 mmol/L — ABNORMAL LOW (ref 101–111)
GFR calc Af Amer: 45 mL/min — ABNORMAL LOW (ref >60)
GFR calc non Af Amer: 39 mL/min — ABNORMAL LOW (ref >60)
GLUCOSE: 84 mg/dL (ref 65–99)
POTASSIUM: 3.9 mmol/L (ref 3.5–5.1)
Sodium: 129 mmol/L — ABNORMAL LOW (ref 135–145)
TOTAL PROTEIN: 5.4 g/dL — AB (ref 6.5–8.1)

## 2017-02-15 LAB — AMMONIA: AMMONIA: 48 umol/L — AB (ref 9–35)

## 2017-02-15 MED ORDER — VANCOMYCIN HCL 10 G IV SOLR
1250.0000 mg | INTRAVENOUS | Status: DC
  Administered 2017-02-15: 13:00:00 1250 mg via INTRAVENOUS
  Filled 2017-02-15 (×2): qty 1250

## 2017-02-15 MED ORDER — DEXTROSE 5 % IV SOLN
2.0000 g | Freq: Two times a day (BID) | INTRAVENOUS | Status: DC
  Administered 2017-02-15 – 2017-02-18 (×7): 2 g via INTRAVENOUS
  Filled 2017-02-15 (×9): qty 2

## 2017-02-15 MED ORDER — IPRATROPIUM-ALBUTEROL 0.5-2.5 (3) MG/3ML IN SOLN
3.0000 mL | RESPIRATORY_TRACT | Status: DC | PRN
  Administered 2017-02-15: 3 mL via RESPIRATORY_TRACT
  Filled 2017-02-15: qty 3

## 2017-02-15 MED ORDER — MUPIROCIN 2 % EX OINT
1.0000 "application " | TOPICAL_OINTMENT | Freq: Two times a day (BID) | CUTANEOUS | Status: DC
  Administered 2017-02-15 – 2017-02-19 (×9): 1 via NASAL
  Filled 2017-02-15: qty 22

## 2017-02-15 MED ORDER — VANCOMYCIN HCL 10 G IV SOLR
1250.0000 mg | INTRAVENOUS | Status: DC
  Filled 2017-02-15: qty 1250

## 2017-02-15 MED ORDER — CHLORHEXIDINE GLUCONATE CLOTH 2 % EX PADS
6.0000 | MEDICATED_PAD | Freq: Every day | CUTANEOUS | Status: AC
  Administered 2017-02-15 – 2017-02-19 (×5): 6 via TOPICAL

## 2017-02-15 MED ORDER — PANTOPRAZOLE SODIUM 40 MG IV SOLR
40.0000 mg | INTRAVENOUS | Status: DC
  Administered 2017-02-15 – 2017-02-19 (×5): 40 mg via INTRAVENOUS
  Filled 2017-02-15 (×5): qty 40

## 2017-02-15 MED ORDER — MORPHINE SULFATE (PF) 2 MG/ML IV SOLN
2.0000 mg | Freq: Once | INTRAVENOUS | Status: AC
  Administered 2017-02-16: 2 mg via INTRAVENOUS
  Filled 2017-02-15 (×2): qty 1

## 2017-02-15 MED ORDER — VANCOMYCIN HCL 10 G IV SOLR
1250.0000 mg | Freq: Once | INTRAVENOUS | Status: AC
  Administered 2017-02-15: 1250 mg via INTRAVENOUS
  Filled 2017-02-15: qty 1250

## 2017-02-15 NOTE — Progress Notes (Signed)
PHARMACY - PHYSICIAN COMMUNICATION CRITICAL VALUE ALERT - BLOOD CULTURE IDENTIFICATION (BCID)  Results for orders placed or performed during the hospital encounter of 04/26/16  Blood Culture ID Panel (Reflexed) (Collected: 04/26/2016  2:19 PM)  Result Value Ref Range   Enterococcus species NOT DETECTED NOT DETECTED   Vancomycin resistance NOT DETECTED NOT DETECTED   Listeria monocytogenes NOT DETECTED NOT DETECTED   Staphylococcus species DETECTED (A) NOT DETECTED   Staphylococcus aureus DETECTED (A) NOT DETECTED   Methicillin resistance DETECTED (A) NOT DETECTED   Streptococcus species NOT DETECTED NOT DETECTED   Streptococcus agalactiae NOT DETECTED NOT DETECTED   Streptococcus pneumoniae NOT DETECTED NOT DETECTED   Streptococcus pyogenes NOT DETECTED NOT DETECTED   Acinetobacter baumannii NOT DETECTED NOT DETECTED   Enterobacteriaceae species NOT DETECTED NOT DETECTED   Enterobacter cloacae complex NOT DETECTED NOT DETECTED   Escherichia coli NOT DETECTED NOT DETECTED   Klebsiella oxytoca NOT DETECTED NOT DETECTED   Klebsiella pneumoniae NOT DETECTED NOT DETECTED   Proteus species NOT DETECTED NOT DETECTED   Serratia marcescens NOT DETECTED NOT DETECTED   Carbapenem resistance NOT DETECTED NOT DETECTED   Haemophilus influenzae NOT DETECTED NOT DETECTED   Neisseria meningitidis NOT DETECTED NOT DETECTED   Pseudomonas aeruginosa NOT DETECTED NOT DETECTED   Candida albicans NOT DETECTED NOT DETECTED   Candida glabrata NOT DETECTED NOT DETECTED   Candida krusei NOT DETECTED NOT DETECTED   Candida parapsilosis NOT DETECTED NOT DETECTED   Candida tropicalis NOT DETECTED NOT DETECTED    Name of physician (or Provider) Contacted: [place]  Changes to prescribed antibiotics required: No changes necessary at this time. Patient is already on vancomycin.  Cindi Carbon, PharmD Clinical Pharmacist 02/15/2017  11:38 AM

## 2017-02-15 NOTE — Progress Notes (Signed)
Patient noted with respiratory distress prior to administration of HS meds. Coughing and Sats in the 60s on RA. Coarse crackles throughout. HOB elevated, 6L O2 via ventri mask, and suctioned orally with minimal response to coughing and oxygen saturation. 10L O2 with Non rebreather applied. MD called. STAT orders for ABG and CXR. RN at bedside, will continue to monitor and endorse.

## 2017-02-15 NOTE — Progress Notes (Signed)
Pharmacy Antibiotic Note  Jeff Preston is a 71 y.o. male admitted on 02/14/2017 with pneumonia.  Pharmacy has been consulted for vancomycin and cefepime dosing.  Plan: DW 80kg  Vd 56L kei 0.038 hr-1  t1/2 18 hours Vancomycin 1250 mg q 18 hours ordered with stacked dosing. Level before 5th dose. Goal trough 15-20.  Cefepime 2 grams q 12 hours ordered.  Weight: 224 lb (101.6 kg)  Temp (24hrs), Avg:98.2 F (36.8 C), Min:97.6 F (36.4 C), Max:98.5 F (36.9 C)   Recent Labs Lab 02/14/17 1411 02/14/17 1640  WBC 15.7*  --   CREATININE 1.99*  --   LATICACIDVEN 2.1* 1.7    Estimated Creatinine Clearance: 39.8 mL/min (A) (by C-G formula based on SCr of 1.99 mg/dL (H)).    Allergies  Allergen Reactions  . Dilantin [Phenytoin Sodium Extended] Rash  . Penicillins Rash and Other (See Comments)    Has patient had a PCN reaction causing immediate rash, facial/tongue/throat swelling, SOB or lightheadedness with hypotension: No Has patient had a PCN reaction causing severe rash involving mucus membranes or skin necrosis: No Has patient had a PCN reaction that required hospitalization No Has patient had a PCN reaction occurring within the last 10 years: No If all of the above answers are "NO", then may proceed with Cephalosporin use.    Antimicrobials this admission: vancomycin cefepime 4/8 >>    >>   Dose adjustments this admission:   Microbiology results: 4/7 BCx: pending 4/7 UCx: pending  4/7 MRSA PCR: (+)      4/7 CXR: L base consolidation 4/7 UA: (-) Thank you for allowing pharmacy to be a part of this patient's care.  Faryal Marxen S 02/15/2017 3:43 AM

## 2017-02-15 NOTE — Progress Notes (Signed)
Patient ID: Jeff Preston, male   DOB: Feb 01, 1946, 71 y.o.   MRN: [id number]  [facility] PROGRESS NOTE  Jeff Preston:[id number] DOB: February 18, 1946 DOA: 02/14/2017 PCP: [facility]  HPI/Subjective: Patient became short of breath last night and may have had an aspiration episode last night. He was made nothing by mouth after choking on some water. He was started on antibiotics. His IV fluids were stopped and he was given Lasix and albumin. The patient feels okay. He states he ended up on the floor somehow and doesn't recall how he got there.  Objective: Vitals:   02/14/17 2020 02/15/17 0326  BP: (!) 139/58 (!) 103/40  Pulse: 85 88  Resp: 20 20  Temp: 98.5 F (36.9 C) 98.4 F (36.9 C)    Filed Weights   02/15/17 0305  Weight: 101.6 kg (224 lb)    ROS: Review of Systems  Constitutional: Negative for chills and fever.  Eyes: Negative for blurred vision.  Respiratory: Positive for cough and shortness of breath.   Cardiovascular: Negative for chest pain.  Gastrointestinal: Negative for abdominal pain, constipation, diarrhea, nausea and vomiting.  Genitourinary: Negative for dysuria.  Musculoskeletal: Negative for joint pain.  Neurological: Negative for dizziness and headaches.   Exam: Physical Exam  Constitutional: He is oriented to person, place, and time.  HENT:  Nose: No mucosal edema.  Mouth/Throat: No oropharyngeal exudate or posterior oropharyngeal edema.  Eyes: Conjunctivae, EOM and lids are normal. Pupils are equal, round, and reactive to light.  Neck: No JVD present. Carotid bruit is not present. No edema present. No thyroid mass and no thyromegaly present.  Cardiovascular: S1 normal and S2 normal.  Exam reveals no gallop.   No murmur heard. Pulses:      Dorsalis pedis pulses are 2+ on the right side, and 2+ on the left side.  Respiratory: No respiratory distress. He has no wheezes. He has no rhonchi. He has no rales.  GI: Soft. Bowel sounds are  normal. He exhibits distension. There is no tenderness. A hernia is present.  Genitourinary:  Genitourinary Comments: 4+ scrotal swelling  Musculoskeletal:       Right ankle: He exhibits swelling.       Left ankle: He exhibits swelling.  Lymphadenopathy:    He has no cervical adenopathy.  Neurological: He is alert and oriented to person, place, and time. No cranial nerve deficit.  Skin: Skin is warm. No rash noted. Nails show no clubbing.  Psychiatric: He has a normal mood and affect.      Data Reviewed: Basic Metabolic Panel:  Recent Labs Lab 02/12/17 1047 02/14/17 1411 02/15/17 0607  NA 128* 125* 129*  K  --  4.2 3.9  CL  --  94* 97*  CO2  --  25 24  GLUCOSE  --  89 84  BUN  --  30* 34*  CREATININE  --  1.99* 1.71*  CALCIUM  --  7.7* 7.4*   Liver Function Tests:  Recent Labs Lab 02/14/17 1411 02/15/17 0607  AST 102* 71*  ALT 24 22  ALKPHOS 78 75  BILITOT 2.7* 2.7*  PROT 5.6* 5.4*  ALBUMIN 2.5* 2.5*    Recent Labs Lab 02/14/17 1411  LIPASE 20    Recent Labs Lab 02/14/17 1411 02/15/17 0607  AMMONIA 18 48*   CBC:  Recent Labs Lab 02/14/17 1411 02/15/17 0607  WBC 15.7* 11.7*  HGB 9.0* 8.2*  HCT 27.2* 25.1*  MCV 77.6* 77.0*  PLT 135* 135*  Cardiac Enzymes:  Recent Labs Lab 02/14/17 1411  CKTOTAL 784*  TROPONINI <0.03     Recent Results (from the past 240 hour(s))  Culture, blood (Routine X 2) w Reflex to ID Panel     Status: None (Preliminary result)   Collection Time: 02/14/17  3:16 PM  Result Value Ref Range Status   Specimen Description BLOOD RIGHT ARM  Final   Special Requests   Final    BOTTLES DRAWN AEROBIC AND ANAEROBIC Blood Culture results may not be optimal due to an excessive volume of blood received in culture bottles   Culture NO GROWTH < 24 HOURS  Final   Report Status PENDING  Incomplete  Culture, blood (Routine X 2) w Reflex to ID Panel     Status: None (Preliminary result)   Collection Time: 02/14/17  3:16 PM   Result Value Ref Range Status   Specimen Description BLOOD LEFT ASSIST CONTROL  Final   Special Requests   Final    BOTTLES DRAWN AEROBIC AND ANAEROBIC Blood Culture adequate volume   Culture  Setup Time   Final    Organism ID to follow GRAM POSITIVE COCCI ANAEROBIC BOTTLE ONLY CRITICAL RESULT CALLED TO, READ BACK BY AND VERIFIED WITH: Keturah Barre @ 1610 02/15/17 by [facility]    Culture GRAM POSITIVE COCCI  Final   Report Status PENDING  Incomplete  Blood Culture ID Panel (Reflexed)     Status: Abnormal   Collection Time: 02/14/17  3:16 PM  Result Value Ref Range Status   Enterococcus species NOT DETECTED NOT DETECTED Final   Listeria monocytogenes NOT DETECTED NOT DETECTED Final   Staphylococcus species DETECTED (A) NOT DETECTED Final    Comment: Methicillin (oxacillin) resistant coagulase negative staphylococcus. Possible blood culture contaminant (unless isolated from more than one blood culture draw or clinical case suggests pathogenicity). No antibiotic treatment is indicated for blood  culture contaminants. CRITICAL RESULT CALLED TO, READ BACK BY AND VERIFIED WITH: Keturah Barre @ 9604 02/15/17 by [facility]    Staphylococcus aureus NOT DETECTED NOT DETECTED Final   Methicillin resistance DETECTED (A) NOT DETECTED Final    Comment: CRITICAL RESULT CALLED TO, READ BACK BY AND VERIFIED WITH: Keturah Barre @ 5409 02/15/17 by [facility]    Streptococcus species NOT DETECTED NOT DETECTED Final   Streptococcus agalactiae NOT DETECTED NOT DETECTED Final   Streptococcus pneumoniae NOT DETECTED NOT DETECTED Final   Streptococcus pyogenes NOT DETECTED NOT DETECTED Final   Acinetobacter baumannii NOT DETECTED NOT DETECTED Final   Enterobacteriaceae species NOT DETECTED NOT DETECTED Final   Enterobacter cloacae complex NOT DETECTED NOT DETECTED Final   Escherichia coli NOT DETECTED NOT DETECTED Final   Klebsiella oxytoca NOT DETECTED NOT DETECTED Final   Klebsiella pneumoniae NOT DETECTED NOT DETECTED Final    Proteus species NOT DETECTED NOT DETECTED Final   Serratia marcescens NOT DETECTED NOT DETECTED Final   Haemophilus influenzae NOT DETECTED NOT DETECTED Final   Neisseria meningitidis NOT DETECTED NOT DETECTED Final   Pseudomonas aeruginosa NOT DETECTED NOT DETECTED Final   Candida albicans NOT DETECTED NOT DETECTED Final   Candida glabrata NOT DETECTED NOT DETECTED Final   Candida krusei NOT DETECTED NOT DETECTED Final   Candida parapsilosis NOT DETECTED NOT DETECTED Final   Candida tropicalis NOT DETECTED NOT DETECTED Final  MRSA PCR Screening     Status: Abnormal   Collection Time: 02/14/17  6:21 PM  Result Value Ref Range Status   MRSA by PCR POSITIVE (A) NEGATIVE Final  Comment:        The GeneXpert MRSA Assay (FDA approved for NASAL specimens only), is one component of a comprehensive MRSA colonization surveillance program. It is not intended to diagnose MRSA infection nor to guide or monitor treatment for MRSA infections. CRITICAL RESULT CALLED TO, READ BACK BY AND VERIFIED WITH: CASEY [facility] WARD AT 1948 ON 02/14/2017 JLJ      Studies: Ct Head Wo Contrast  Result Date: 02/14/2017 CLINICAL DATA:  Fall, found down. EXAM: CT HEAD WITHOUT CONTRAST CT CERVICAL SPINE WITHOUT CONTRAST TECHNIQUE: Multidetector CT imaging of the head and cervical spine was performed following the standard protocol without intravenous contrast. Multiplanar CT image reconstructions of the cervical spine were also generated. COMPARISON:  Head CT dated 09/22/2016. FINDINGS: CT HEAD FINDINGS Brain: Mild generalized parenchymal atrophy with commensurate dilatation of the ventricles and sulci. No mass, hemorrhage, edema or other evidence of acute parenchymal abnormality. No extra-axial hemorrhage. Vascular: There are chronic calcified atherosclerotic changes of the large vessels at the skull base. No unexpected hyperdense vessel. Skull: Normal. Negative for fracture or focal lesion. Sinuses/Orbits: No acute  finding. Other: None. CT CERVICAL SPINE FINDINGS Alignment: Mild levoscoliosis of the lower cervical spine. No evidence of acute vertebral body subluxation. Skull base and vertebrae: No fracture line or displaced fracture fragment. Facet joints appear intact and normally aligned throughout. Soft tissues and spinal canal: No prevertebral fluid or swelling. No visible canal hematoma. Disc levels: Mild degenerative spurring within the mid and lower cervical spine, associated mild disc-osteophytic bulge at C5-6. No more than mild central canal stenosis at any level. Upper chest: No acute findings. Other: Carotid atherosclerosis. IMPRESSION: 1. No acute intracranial abnormality. No intracranial mass, hemorrhage or edema. No skull fracture. 2. No fracture or acute subluxation within the cervical spine. Mild degenerative change within the mid and lower cervical spine. Mild scoliosis. 3. Carotid atherosclerosis. Electronically Signed   By: Bary Bartlomiej Jenkinson M.D.   On: 02/14/2017 14:22   Ct Cervical Spine Wo Contrast  Result Date: 02/14/2017 CLINICAL DATA:  Fall, found down. EXAM: CT HEAD WITHOUT CONTRAST CT CERVICAL SPINE WITHOUT CONTRAST TECHNIQUE: Multidetector CT imaging of the head and cervical spine was performed following the standard protocol without intravenous contrast. Multiplanar CT image reconstructions of the cervical spine were also generated. COMPARISON:  Head CT dated 09/22/2016. FINDINGS: CT HEAD FINDINGS Brain: Mild generalized parenchymal atrophy with commensurate dilatation of the ventricles and sulci. No mass, hemorrhage, edema or other evidence of acute parenchymal abnormality. No extra-axial hemorrhage. Vascular: There are chronic calcified atherosclerotic changes of the large vessels at the skull base. No unexpected hyperdense vessel. Skull: Normal. Negative for fracture or focal lesion. Sinuses/Orbits: No acute finding. Other: None. CT CERVICAL SPINE FINDINGS Alignment: Mild levoscoliosis of the  lower cervical spine. No evidence of acute vertebral body subluxation. Skull base and vertebrae: No fracture line or displaced fracture fragment. Facet joints appear intact and normally aligned throughout. Soft tissues and spinal canal: No prevertebral fluid or swelling. No visible canal hematoma. Disc levels: Mild degenerative spurring within the mid and lower cervical spine, associated mild disc-osteophytic bulge at C5-6. No more than mild central canal stenosis at any level. Upper chest: No acute findings. Other: Carotid atherosclerosis. IMPRESSION: 1. No acute intracranial abnormality. No intracranial mass, hemorrhage or edema. No skull fracture. 2. No fracture or acute subluxation within the cervical spine. Mild degenerative change within the mid and lower cervical spine. Mild scoliosis. 3. Carotid atherosclerosis. Electronically Signed   By: Bary Sayvon Arterberry  M.D.   On: 02/14/2017 14:22   Dg Chest Port 1 View  Result Date: 02/15/2017 CLINICAL DATA:  Cough tonight EXAM: PORTABLE CHEST 1 VIEW COMPARISON:  02/14/2017 FINDINGS: Patchy opacity in the left base could represent infectious infiltrate. The right lung is clear. The pulmonary vasculature is normal per IMPRESSION: Lateral left base patchy airspace consolidation, possibly pneumonia. Followup PA and lateral chest X-ray is recommended in 3-4 weeks following trial of antibiotic therapy to ensure resolution and exclude underlying malignancy. Electronically Signed   By: Ellery Plunk M.D.   On: 02/15/2017 01:51   Dg Chest Portable 1 View  Result Date: 02/14/2017 CLINICAL DATA:  Fall at home. EXAM: PORTABLE CHEST 1 VIEW COMPARISON:  10/25/2016 FINDINGS: Lungs are adequately inflated without focal consolidation or effusion. Borderline stable cardiomegaly. Bones and soft tissues are unchanged. IMPRESSION: No active disease. Electronically Signed   By: Elberta Fortis M.D.   On: 02/14/2017 13:55    Scheduled Meds: . ceFEPime (MAXIPIME) IV  2 g Intravenous  Q12H  . Chlorhexidine Gluconate Cloth  6 each Topical Q0600  . feeding supplement (ENSURE ENLIVE)  237 mL Oral BID BM  . heparin  5,000 Units Subcutaneous Q8H  . lactulose  30 g Oral BID  . mupirocin ointment  1 application Nasal BID  . nadolol  20 mg Oral Daily  . pantoprazole (PROTONIX) IV  40 mg Intravenous Q24H  . vancomycin  1,250 mg Intravenous Q24H    Assessment/Plan:  1. Aspiration event and pneumonia seen in the left lower lobe. Patient started on aggressive antibiotics with vancomycin and cefepime. Positive blood cultures staph species likely contamination. Continue the vancomycin at this point. I put on dysphagia diet and will have speech therapy see him tomorrow 2. End-stage liver disease with refractory ascites. Patient receives paracentesis every week. Patient is followed by hospice. Patient is a full code at this point. Lactulose needs to be given. If he can't take it orally and has to be given rectally. Nodalol if able to tolerate. 3. Chronic hyponatremia 4. Acute encephalopathy seems a little improved today 5. Mild rhabdomyolysis being found on the floor. Unable to give IV fluids secondary to shortness of breath last night 6. Weakness. Physical therapy evaluation  Code Status:     Code Status Orders        Start     Ordered   02/14/17 1620  Full code  Continuous     02/14/17 1619    Code Status History    Date Active Date Inactive Code Status Order ID Comments User Context   09/22/2016 11:04 PM 09/24/2016  8:30 PM Full Code [id number]  Tonye Royalty, DO Inpatient   07/03/2016 12:38 PM 07/08/2016  3:48 PM DNR [id number]  Enedina Finner, MD Inpatient   04/26/2016  3:36 PM 05/01/2016  6:32 PM DNR [id number]  Milagros Loll, MD ED   03/14/2016 12:30 PM 03/17/2016 10:33 PM DNR [id number]  Suan Halter, MD Inpatient   03/12/2016  8:08 PM 03/14/2016 12:29 PM Full Code [id number]  Auburn Bilberry, MD ED     Family Communication: Sister at the bedside Disposition Plan: To be  determined  Antibiotics:  Vancomycin  Maxipime  Time spent: 28 minutes  Alford Highland  [employer]

## 2017-02-15 NOTE — Progress Notes (Signed)
PT Cancellation Note  Patient Details Name: MADDEX GARLITZ MRN: [id number] DOB: July 13, 1946   Cancelled Treatment:    Reason Eval/Treat Not Completed: Fatigue/lethargy limiting ability to participate (Consult received and chart reviewed.  Patient lethargic and unable to fully participate with session at this time.  Will re-attempt next date as patient available and appropriate.)   Cornisha Zetino H. Manson Passey, PT, DPT, NCS 02/15/17, 3:42 PM [phone]

## 2017-02-15 NOTE — Progress Notes (Signed)
Patient ID: Jeff Preston, male   DOB: [birth year], 71 y.o.   MRN: [id number]  ACP Note  Patient and sister at the bedside.  Since the patient is followed by hospice, I discussed CODE STATUS with the patient. The patient states that he would want reasonable care. I asked him if reasonable care would include CPR. I explained CPR including chest compressions, pushing medications, defibrillation, intubation and mechanical ventilation. Patient still stated he would like reasonable care which would include this.  Patient is a full code at this time.  Diagnosis end-stage liver disease with refractory ascites  Time spent on ACP discussion 17 minutes  Dr. Alford Highland

## 2017-02-16 DIAGNOSIS — Z7189 Other specified counseling: Secondary | ICD-10-CM

## 2017-02-16 DIAGNOSIS — Z515 Encounter for palliative care: Secondary | ICD-10-CM

## 2017-02-16 LAB — CBC
HCT: 26.5 % — ABNORMAL LOW (ref 40.0–52.0)
Hemoglobin: 8.8 g/dL — ABNORMAL LOW (ref 13.0–18.0)
MCH: 26.1 pg (ref 26.0–34.0)
MCHC: 33.1 g/dL (ref 32.0–36.0)
MCV: 78.7 fL — ABNORMAL LOW (ref 80.0–100.0)
Platelets: 131 10*3/uL — ABNORMAL LOW (ref 150–440)
RBC: 3.36 MIL/uL — ABNORMAL LOW (ref 4.40–5.90)
RDW: 18.7 % — AB (ref 11.5–14.5)
WBC: 8.9 10*3/uL (ref 3.8–10.6)

## 2017-02-16 LAB — BASIC METABOLIC PANEL
Anion gap: 6 (ref 5–15)
BUN: 37 mg/dL — AB (ref 6–20)
CHLORIDE: 102 mmol/L (ref 101–111)
CO2: 23 mmol/L (ref 22–32)
Calcium: 7.3 mg/dL — ABNORMAL LOW (ref 8.9–10.3)
Creatinine, Ser: 1.85 mg/dL — ABNORMAL HIGH (ref 0.61–1.24)
GFR calc Af Amer: 41 mL/min — ABNORMAL LOW (ref >60)
GFR, EST NON AFRICAN AMERICAN: 36 mL/min — AB (ref >60)
Glucose, Bld: 138 mg/dL — ABNORMAL HIGH (ref 65–99)
Potassium: 3.9 mmol/L (ref 3.5–5.1)
SODIUM: 131 mmol/L — AB (ref 135–145)

## 2017-02-16 LAB — GLUCOSE, CAPILLARY
GLUCOSE-CAPILLARY: 101 mg/dL — AB (ref 65–99)
GLUCOSE-CAPILLARY: 101 mg/dL — AB (ref 65–99)
GLUCOSE-CAPILLARY: 120 mg/dL — AB (ref 65–99)
GLUCOSE-CAPILLARY: 125 mg/dL — AB (ref 65–99)
Glucose-Capillary: 136 mg/dL — ABNORMAL HIGH (ref 65–99)

## 2017-02-16 LAB — CK: Total CK: 146 U/L (ref 49–397)

## 2017-02-16 MED ORDER — VANCOMYCIN HCL 10 G IV SOLR
1250.0000 mg | INTRAVENOUS | Status: DC
  Administered 2017-02-16 – 2017-02-17 (×2): 1250 mg via INTRAVENOUS
  Filled 2017-02-16 (×4): qty 1250

## 2017-02-16 MED ORDER — CIPROFLOXACIN HCL 0.3 % OP SOLN
1.0000 [drp] | OPHTHALMIC | Status: DC
Start: 2017-02-16 — End: 2017-02-18
  Administered 2017-02-16 – 2017-02-18 (×8): 1 [drp] via OPHTHALMIC
  Filled 2017-02-16: qty 2.5

## 2017-02-16 MED ORDER — ORAL CARE MOUTH RINSE
15.0000 mL | Freq: Two times a day (BID) | OROMUCOSAL | Status: DC
  Administered 2017-02-16 – 2017-02-19 (×7): 15 mL via OROMUCOSAL

## 2017-02-16 MED ORDER — LACTULOSE ENEMA
300.0000 mL | Freq: Two times a day (BID) | ORAL | Status: DC
  Administered 2017-02-16 – 2017-02-17 (×2): 300 mL via RECTAL
  Filled 2017-02-16 (×3): qty 300

## 2017-02-16 MED ORDER — LEVALBUTEROL HCL 0.63 MG/3ML IN NEBU
0.6300 mg | INHALATION_SOLUTION | Freq: Once | RESPIRATORY_TRACT | Status: AC
  Administered 2017-02-16: 0.63 mg via RESPIRATORY_TRACT
  Filled 2017-02-16: qty 3

## 2017-02-16 MED ORDER — LORAZEPAM 2 MG/ML IJ SOLN
1.0000 mg | Freq: Once | INTRAMUSCULAR | Status: AC
Start: 2017-02-16 — End: 2017-02-16
  Administered 2017-02-16: 03:00:00 1 mg via INTRAVENOUS
  Filled 2017-02-16: qty 1

## 2017-02-16 NOTE — Consult Note (Signed)
Consultation Note Date: 02/16/2017   Patient Name: Jeff Preston  DOB: June 12, 1946  MRN: [id number]  Age / Sex: 71 y.o., male  PCP: [place] Referring Physician: Loletha Grayer, MD  Reason for Consultation: Establishing goals of care with end stage liver disease on hospice care. Palliative asked to help clarify goals.   HPI/Patient Profile: 71 y.o. male  with past medical history of end stage alcoholic cirrhosis, recurrent ascites requiring weekly paracentesis, chronic hyponatremia, depression/anxiety, HTN, GERD, umbilical hernia admitted on 02/14/2017 after his son found him on the floor of their home. Per notes he has been increasingly weak with poor appetite.   Clinical Assessment and Goals of Care: I met today with Mr. Brinegar, no family at bedside. He is an extremely pleasant gentleman. He is very slow in speech and takes time for him to express himself at times but seems to have a decent understanding of his situation. Able to tell me that he has severe liver disease and understanding that this is not reversible and he will likely die from this. Able to tell me that he has hospice assistance in the home. Does not remember how he got on the floor when he was admitted. He did not understand last nights events (seems like he may have aspirated and his sats dropped which likely added to his confusion). Today he is sitting up in his recliner on room air - says he is hungry. He also says he has coughed more in the past week PTA and also recently chipped a tooth that has been sore to chew. His sister helps make sure he has meals stocked to heat up.   We were able to have an open discussion regarding EOL and dying. He tells me that he is fearful of the unknown and of leaving his son. He says he is a [place] and has strong faith in his afterlife but wonders what that will be like. We discussed  that it is only nature to fear the unknown - even with strong faith that this is a normal response. He also worries about being a burden to his sister as his health worsens. His goal is to have more time to be able to spend some time and try and get to know him a little better. He says his son is very private and works a lot so he does not really know much of his life. We were able to discuss code status as well - I did clarify that resuscitation is highly unsuccessful and encouraged him to continue to think about if this is something he wishes to go through even if we believe it will not be beneficial to him. For now he desires full code but if unsuccessful he would not want his life prolonged on life support.   I will follow up again tomorrow. He also is requesting eye drops as his right eye is slightly red and inflamed but unable to find what eye drops he was using at home - he said  they were antibiotic drops. None on home med list and hospice does not have any record of this eye drop either.   Primary Decision Maker PATIENT    SUMMARY OF RECOMMENDATIONS   - Recommended reconsideration of full code - He is fairly comfortable discussing EOL and dying and seems fairly accepting of when his time comes - Goal is to try and connect more with his son before he dies  Code Status/Advance Care Planning:  Full code   Symptom Management:   SLP to see today - concern for aspiration. Of note he does say that he has seen a SLP before who taught him techniques to swallow better.   Ascites: Consider peritoneal PleurX drain to better manage ascites. Will defer to hospice if this will be a good/safe option for him.   Palliative Prophylaxis:   Aspiration, Bowel Regimen, Delirium Protocol, Frequent Pain Assessment and Turn Reposition   Psycho-social/Spiritual:   Desire for further Chaplaincy support:no  Additional Recommendations: Caregiving  Support/Resources  Prognosis:   < 6 months very likely  as he seems to be declining more in the past few weeks  Discharge Planning: Home with Hospice      Primary Diagnoses: Present on Admission: . Altered mental status   I have reviewed the medical record, interviewed the patient and family, and examined the patient. The following aspects are pertinent.  Past Medical History:  Diagnosis Date  . Anxiety   . Anxiety   . Ascites   . Chronic hyponatremia   . Cirrhosis ([place])   . Depression   . DNR (do not resuscitate)   . GERD (gastroesophageal reflux disease)   . Hypertension   . Umbilical hernia    Social History   Social History  . Marital status: Single    Spouse name: N/A  . Number of children: N/A  . Years of education: N/A   Social History Main Topics  . Smoking status: Former Research scientist (life sciences)  . Smokeless tobacco: Former Systems developer    Quit date: 07/04/2016  . Alcohol use No  . Drug use: No  . Sexual activity: No   Other Topics Concern  . None   Social History Narrative  . None   Family History  Problem Relation Age of Onset  . CAD Mother   . Heart attack Mother   . Alzheimer's disease Father    Scheduled Meds: . ceFEPime (MAXIPIME) IV  2 g Intravenous Q12H  . Chlorhexidine Gluconate Cloth  6 each Topical Q0600  . feeding supplement (ENSURE ENLIVE)  237 mL Oral BID BM  . heparin  5,000 Units Subcutaneous Q8H  . mouth rinse  15 mL Mouth Rinse BID  . mupirocin ointment  1 application Nasal BID  . nadolol  20 mg Oral Daily  . pantoprazole (PROTONIX) IV  40 mg Intravenous Q24H  . vancomycin  1,250 mg Intravenous Q18H   Continuous Infusions: PRN Meds:.acetaminophen **OR** acetaminophen, ipratropium-albuterol, ondansetron **OR** ondansetron (ZOFRAN) IV, simethicone Allergies  Allergen Reactions  . Dilantin [Phenytoin Sodium Extended] Rash  . Penicillins Rash and Other (See Comments)    Has patient had a PCN reaction causing immediate rash, facial/tongue/throat swelling, SOB or lightheadedness with hypotension: No Has  patient had a PCN reaction causing severe rash involving mucus membranes or skin necrosis: No Has patient had a PCN reaction that required hospitalization No Has patient had a PCN reaction occurring within the last 10 years: No If all of the above answers are "NO", then may proceed with Cephalosporin use.  Review of Systems  Constitutional: Positive for activity change and fatigue.  Respiratory: Positive for cough.   Gastrointestinal: Positive for abdominal distention.  Neurological: Positive for weakness.  Psychiatric/Behavioral: Positive for sleep disturbance.    Physical Exam  Constitutional: He is oriented to person, place, and time. He appears well-developed.  Cardiovascular: Normal rate.   Pulmonary/Chest: No accessory muscle usage. No apnea and no tachypnea. No respiratory distress. He has decreased breath sounds. He has rhonchi.  Weak cough  Abdominal: He exhibits distension.  Neurological: He is alert and oriented to person, place, and time.  Response and speech delayed and slowed.   Psychiatric: Cognition and memory are impaired.  Struggles to think of answers to basic questions at times.   Nursing note and vitals reviewed.   Vital Signs: BP (!) 119/56 (BP Location: Right Arm)   Pulse 73   Temp 97.7 F (36.5 C) (Oral)   Resp 18   Ht 5' 11.5" (1.816 m)   Wt 101.6 kg (224 lb)   SpO2 95%   BMI 30.81 kg/m  Pain Assessment: No/denies pain POSS *See Group Information*: 2-Acceptable,Slightly drowsy, easily aroused Pain Score: 0-No pain   SpO2: SpO2: 95 % O2 Device:SpO2: 95 % O2 Flow Rate: .O2 Flow Rate (L/min): 2 L/min  IO: Intake/output summary:  Intake/Output Summary (Last 24 hours) at 02/16/17 1247 Last data filed at 02/16/17 1128  Gross per 24 hour  Intake              840 ml  Output              400 ml  Net              440 ml    LBM: Last BM Date: 02/14/17 (per patient) Baseline Weight: Weight: 101.6 kg (224 lb) Most recent weight: Weight: 101.6 kg  (224 lb)     Palliative Assessment/Data:   Flowsheet Rows     Most Recent Value  Intake Tab  Referral Department  Hospitalist  Unit at Time of Referral  Oncology Unit  Palliative Care Primary Diagnosis  Other (Comment) [ESLD]  Date Notified  02/14/17  Palliative Care Type  New Palliative care  Reason for referral  Clarify Goals of Care  Date of Admission  02/14/17  # of days IP prior to Palliative referral  0  Clinical Assessment  Psychosocial & Spiritual Assessment  Palliative Care Outcomes      Time In: 1150 Time Out: 1300 Time Total: 28mn Greater than 50%  of this time was spent counseling and coordinating care related to the above assessment and plan.  Signed by: AVinie Sill NP Palliative Medicine Team Pager # [phone](M-F 8a-5p) Team Phone # [phone](Nights/Weekends)

## 2017-02-16 NOTE — Plan of Care (Signed)
Problem: Education: Goal: Knowledge of [facility] Education information/materials will improve Outcome: Not Progressing Variance: Physical/mental limitations   Problem: Safety: Goal: Ability to remain free from injury will improve Outcome: Not Progressing Variance: Physical/mental limitations Comments: Continue to experience respiratory distress form aspirating.   Problem: Fluid Volume: Goal: Ability to maintain a balanced intake and output will improve Outcome: Not Progressing Variance: Psychosocial issues Comments: Patient is now NPO  Problem: Nutrition: Goal: Adequate nutrition will be maintained Outcome: Not Progressing Variance: Physical/mental limitations Comments: Patient is now NPO

## 2017-02-16 NOTE — Evaluation (Signed)
Physical Therapy Evaluation Patient Details Name: Jeff Preston MRN: [id number] DOB: Sep 15, 1946 Today's Date: 02/16/2017   History of Present Illness  Pt is a 71 yo male presenting to [facility] after a fall and being found by his son, he was admitted to [facility] w/ altered mental status, hyponatremia and generalized weakness; later was noted to have pneumonia. Pt is currently receiving hospice services and has a PMH of; anxiety, ascites, chronic hyponatremia, cirrhosis, depression, GERD, HTN and an umbilical hernia    Clinical Impression  Pt slightly tired upon entrance but willing to participate in PT evaluation and followed commands throughout. He some generalized weakness grossly 4/5 throughout and also presents w/ decreased activity tolerance. He requires minimal assistance to move to sitting at EOB due to some generalized weakness and significant swelling of the scrotum that is present at baseline. He required CGA and a RW for transfers and ambulation and was limited in activity tolerance due to fatigue secondary to his current medical status. He will benefit from skilled PT to correct deficits and return to his PLOF, recommend pt receive HHPT when medically appropriate.     Follow Up Recommendations Home health PT;Supervision for mobility/OOB    Equipment Recommendations  3in1 (PT)    Recommendations for Other Services       Precautions / Restrictions Precautions Precautions: Fall Restrictions Weight Bearing Restrictions: No      Mobility  Bed Mobility Overal bed mobility: Needs Assistance Bed Mobility: Supine to Sit     Supine to sit: Min assist     General bed mobility comments: required min assist to scoot and sit at EOB w/ feet supported  Transfers Overall transfer level: Needs assistance Equipment used: Rolling walker (2 wheeled) Transfers: Sit to/from Stand Sit to Stand: Min guard         General transfer comment: pt uses bilat UE to assist w/ push off and has wide  BOS when transferring due to significant fluid/swelling of the scrotum  Ambulation/Gait Ambulation/Gait assistance: Min guard Ambulation Distance (Feet): 3 Feet Assistive device: Rolling walker (2 wheeled) Gait Pattern/deviations: Step-to pattern;Wide base of support;Decreased stance time - right;Decreased step length - left   Gait velocity interpretation: Below normal speed for age/gender General Gait Details: pt takes small short steps to ambulate to the chair in his room, maintains good balance when using the RW and states he just feels weak and tired but able to ambulate w/o any LOB or buckling of the LEs  Stairs            Wheelchair Mobility    Modified Rankin (Stroke Patients Only)       Balance Overall balance assessment: Needs assistance;History of Falls Sitting-balance support: Feet supported;Single extremity supported Sitting balance-Leahy Scale: Good Sitting balance - Comments: able to maintain seated posture at EOB w/o back support   Standing balance support: Bilateral upper extremity supported Standing balance-Leahy Scale: Fair Standing balance comment: requires use of RW for additional stability in standing                              Pertinent Vitals/Pain Pain Assessment: No/denies pain    Home Living Family/patient expects to be discharged to:: Private residence Living Arrangements: Children Available Help at Discharge: Family Type of Home: House Home Access: Stairs to enter Entrance Stairs-Rails: Right Entrance Stairs-Number of Steps: 3 Home Layout: One level Home Equipment: Cane - single point;Walker - 2 wheels  Prior Function Level of Independence: Needs assistance   Gait / Transfers Assistance Needed: pt states he is able to ambulate short distances w/ a RW inside the home,      Comments: pt is a poor historian and unsure how much assistance he required prior to admittance     Hand Dominance        Extremity/Trunk  Assessment   Upper Extremity Assessment Upper Extremity Assessment: Generalized weakness (grossly 4/5)    Lower Extremity Assessment Lower Extremity Assessment: Generalized weakness (grossly 4/5)       Communication   Communication: No difficulties  Cognition Arousal/Alertness: Awake/alert Behavior During Therapy: Flat affect Overall Cognitive Status: No family/caregiver present to determine baseline cognitive functioning                                 General Comments: pt slightly tired, oriented to place and person and able to provide some information and follow basic commands, has flat affect throughout       General Comments      Exercises     Assessment/Plan    PT Assessment Patient needs continued PT services  PT Problem List Decreased strength;Decreased activity tolerance;Decreased balance;Decreased knowledge of use of DME       PT Treatment Interventions DME instruction;Gait training;Functional mobility training;Therapeutic activities;Therapeutic exercise;Balance training;Stair training;Patient/family education    PT Goals (Current goals can be found in the Care Plan section)  Acute Rehab PT Goals Patient Stated Goal: to go back home PT Goal Formulation: With patient Time For Goal Achievement: 03/02/17 Potential to Achieve Goals: Fair    Frequency Min 2X/week   Barriers to discharge        Co-evaluation               End of Session Equipment Utilized During Treatment: Gait belt Activity Tolerance: Patient tolerated treatment well Patient left: in chair;with call bell/phone within reach;with chair alarm set Nurse Communication: Mobility status;Other (comment) (O2 status) PT Visit Diagnosis: Unsteadiness on feet (R26.81);Muscle weakness (generalized) (M62.81);History of falling (Z91.81)    Time: 1032-1100 PT Time Calculation (min) (ACUTE ONLY): 28 min   Charges:         PT G Codes:        [employer]  Student  PT 02/16/17, 3:02 PM [phone]   Manning Luna 02/16/2017, 12:40 PM

## 2017-02-16 NOTE — Evaluation (Signed)
Clinical/Bedside Swallow Evaluation Patient Details  Name: Jeff Preston MRN: [id number] Date of Birth: Jul 02, 1946  Today's Date: 02/16/2017 Time: SLP Start Time (ACUTE ONLY): 1600 SLP Stop Time (ACUTE ONLY): 1700 SLP Time Calculation (min) (ACUTE ONLY): 60 min  Past Medical History:  Past Medical History:  Diagnosis Date  . Anxiety   . Anxiety   . Ascites   . Chronic hyponatremia   . Cirrhosis (HCC)   . Depression   . DNR (do not resuscitate)   . GERD (gastroesophageal reflux disease)   . Hypertension   . Umbilical hernia    Past Surgical History:  Past Surgical History:  Procedure Laterality Date  . CHOLECYSTECTOMY    . COLON SURGERY    . COLOSTOMY N/A   . THROAT SURGERY     HPI:  Pt is a 71 y.o. male with a known history of Alcoholic liver disease, liver cirrhosis, GERD, hypertension, ascites status post recurrent need for paracentesis who presented to the hospital after found by his son on the floor between his bedroom in his bathroom. Patient himself is a very poor historian and therefore most history obtained from the sister over the phone. As per the sister patient has become increasingly weak over the past few days. His appetite has been poor. Today he was more disoriented and found by his son between his bedroom in his bathroom on the floor and therefore brought to the ER for further evaluation. In the emergency room patient underwent routine workup which showed no evidence of infectious source, as ammonia was only mildly elevated but he remains altered and lethargic and therefore hospitalist services were contacted further treatment and evaluation. Per NSG/MD report, pt has had 2 episodes of aspiration the past 2 nights. MD placed order for consult.    Assessment / Plan / Recommendation Clinical Impression  Pt appears at increased risk for aspiration secondary to noted overt s/s of pharyngeal phase dysphagia w/ component of oral phase dysphagia d/t mostly edentulous  status during po trials.  SLP Visit Diagnosis: Dysphagia, oropharyngeal phase (R13.12)    Aspiration Risk  Moderate aspiration risk;Risk for inadequate nutrition/hydration    Diet Recommendation  Dysphagia level 1 w/ Nectar consistency liquids; aspiration precautions; tray setup and assist w/ meals   Medication Administration: Crushed with puree    Other  Recommendations Recommended Consults:  (Dietician f/u) Oral Care Recommendations: Oral care BID;Staff/trained caregiver to provide oral care Other Recommendations: Order thickener from pharmacy;Prohibited food (jello, ice cream, thin soups);Remove water pitcher;Have oral suction available   Follow up Recommendations  (TBD)      Frequency and Duration min 3x week  2 weeks       Prognosis Prognosis for Safe Diet Advancement: Fair Barriers to Reach Goals: Severity of deficits      Swallow Study   General Date of Onset: 02/14/17 HPI: Pt is a 71 y.o. male with a known history of Alcoholic liver disease, liver cirrhosis, GERD, hypertension, ascites status post recurrent need for paracentesis who presented to the hospital after found by his son on the floor between his bedroom in his bathroom. Patient himself is a very poor historian and therefore most history obtained from the sister over the phone. As per the sister patient has become increasingly weak over the past few days. His appetite has been poor. Today he was more disoriented and found by his son between his bedroom in his bathroom on the floor and therefore brought to the ER for further evaluation. In the  emergency room patient underwent routine workup which showed no evidence of infectious source, as ammonia was only mildly elevated but he remains altered and lethargic and therefore hospitalist services were contacted further treatment and evaluation. Per NSG/MD report, pt has had 2 episodes of aspiration the past 2 nights. MD placed order for consult.  Type of Study: Bedside  Swallow Evaluation Previous Swallow Assessment: none indicated Diet Prior to this Study: Dysphagia 3 (soft);Thin liquids Temperature Spikes Noted: No (wbc down to 8.9) Respiratory Status: Nasal cannula (2-4 liters) History of Recent Intubation: No Behavior/Cognition: Alert;Cooperative;Pleasant mood;Distractible;Requires cueing Oral Cavity Assessment: Dry;Dried secretions (sticky) Oral Care Completed by SLP: Recent completion by staff Oral Cavity - Dentition: Missing dentition;Poor condition (mostly edentulous) Vision: Functional for self-feeding Self-Feeding Abilities: Able to feed self;Needs assist;Needs set up Patient Positioning: Upright in bed Baseline Vocal Quality: Low vocal intensity (gravely, soft) Volitional Cough: Weak;Strong Volitional Swallow: Able to elicit    Oral/Motor/Sensory Function Overall Oral Motor/Sensory Function: Within functional limits (grosslyq)   Ice Chips Ice chips: Impaired Presentation: Spoon (fed; 3 trials) Oral Phase Impairments:  (none) Oral Phase Functional Implications:  (none) Pharyngeal Phase Impairments: Throat Clearing - Delayed (x2)   Thin Liquid Thin Liquid: Impaired Presentation: Cup;Self Fed (small, single sips; ~4 ozs total) Oral Phase Impairments:  (none) Oral Phase Functional Implications:  (none) Pharyngeal  Phase Impairments: Cough - Immediate;Cough - Delayed;Throat Clearing - Immediate;Throat Clearing - Delayed (throughout but not consistent w/ every trial)    Nectar Thick Nectar Thick Liquid: Impaired Presentation: Cup;Self Fed (~2ozs total) Oral Phase Impairments:  (none) Oral phase functional implications:  (none) Pharyngeal Phase Impairments: Throat Clearing - Delayed (x2) Other Comments: inconsistent and did not increase w/ trials   Honey Thick Honey Thick Liquid: Not tested   Puree Puree: Impaired Presentation: Self Fed;Spoon (6-7 trials) Oral Phase Impairments:  (none) Oral Phase Functional Implications:   (none) Pharyngeal Phase Impairments: Throat Clearing - Delayed (x1)   Solid   GO   Solid: Not tested         Jerilynn Som, MS, CCC-SLP Watson,Katherine 02/16/2017,7:45 PM

## 2017-02-16 NOTE — Progress Notes (Signed)
Patient also noted with temp 101.8 and HR 146. Tylenol supp administered. MD updated and new orders received for morphine, NPO orders, and duoNeb. Orders noted and carried out. Patient continues with cough. HOB elevated. Mittens placed to B/L hands as patient continues to remove oxygen via [place]. Will continue to monitor and endorse.

## 2017-02-16 NOTE — Progress Notes (Addendum)
Visit made. 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 He is a full code. He was admitted to [facility] on 4/7 for altered mental status. He arrived by EMS, called by his son. Transfer summary in place in hospital chart. Patient seen lying in bed, awake and alert, slow to respond, but responds appropriately to questions. He reports some left side/rib pain and right eye irritation/pain, both reported to Staff RN Lance Bosch. He is currently NPO awaiting a speech evaluation d/t a possible aspiration event overnight which required him to be placed on a nonrebreather. He is currently on room air. Patient's son Deniece Portela [phone]) in just after visit, He is the primary caregiver for his father.Writer made him aware that hospice would follow his father while hospitalized. He stated he had planned to call hospice today to alert them to his father's admission. Patient seen by Palliative Medicine and Physical therapy this morning. Will continue to follow and update hospice team. Thank you. Dayna Barker RN, BSN, [facility] [place], hospital liaison [phone]

## 2017-02-16 NOTE — Progress Notes (Signed)
Patient ID: Jeff Preston, male   DOB: Dec 30, 1945, 71 y.o.   MRN: [id number]   [employer] PROGRESS NOTE  ALPHONZO Preston HKV:[id number] DOB: [birth year] DOA: 02/14/2017 PCP: [facility]  HPI/Subjective: Patient slower with his responses today. As per the nursing staff he had another aspiration episode last night it was initially placed on 100% nonrebreather but now tapered down to room air. Patient complains of a little cough and some shortness of breath when questioned.    Objective: Vitals:   02/16/17 0905 02/16/17 1436  BP: (!) 119/56 (!) 103/44  Pulse: 73 74  Resp: 18 18  Temp: 97.7 F (36.5 C) 97.6 F (36.4 C)    Filed Weights   02/15/17 0305  Weight: 101.6 kg (224 lb)    ROS: Review of Systems  Unable to perform ROS: Acuity of condition  Respiratory: Positive for cough and shortness of breath.   Cardiovascular: Negative for chest pain.  Gastrointestinal: Negative for abdominal pain.   Exam: Physical Exam  Constitutional: He is oriented to person, place, and time.  HENT:  Nose: No mucosal edema.  Mouth/Throat: No oropharyngeal exudate or posterior oropharyngeal edema.  Eyes: Conjunctivae, EOM and lids are normal. Pupils are equal, round, and reactive to light.  Neck: No JVD present. Carotid bruit is not present. No edema present. No thyroid mass and no thyromegaly present.  Cardiovascular: S1 normal and S2 normal.  Exam reveals no gallop.   No murmur heard. Pulses:      Dorsalis pedis pulses are 2+ on the right side, and 2+ on the left side.  Respiratory: No respiratory distress. He has no wheezes. He has no rhonchi. He has no rales.  GI: Soft. Bowel sounds are normal. He exhibits distension. There is no tenderness. A hernia is present.  Genitourinary:  Genitourinary Comments: 4+ scrotal swelling  Musculoskeletal:       Right ankle: He exhibits swelling.       Left ankle: He exhibits swelling.  Lymphadenopathy:    He has no cervical  adenopathy.  Neurological: He is alert and oriented to person, place, and time. No cranial nerve deficit.  Skin: Skin is warm. No rash noted. Nails show no clubbing.  Some dried blood from the back of the right ear.  Psychiatric: He has a normal mood and affect.      Data Reviewed: Basic Metabolic Panel:  Recent Labs Lab 02/12/17 1047 02/14/17 1411 02/15/17 0607 02/16/17 0438  NA 128* 125* 129* 131*  K  --  4.2 3.9 3.9  CL  --  94* 97* 102  CO2  --  GLUCOSE  --  89 84 138*  BUN  --  30* 34* 37*  CREATININE  --  1.99* 1.71* 1.85*  CALCIUM  --  7.7* 7.4* 7.3*   Liver Function Tests:  Recent Labs Lab 02/14/17 1411 02/15/17 0607  AST 102* 71*  ALT 24 22  ALKPHOS 78 75  BILITOT 2.7* 2.7*  PROT 5.6* 5.4*  ALBUMIN 2.5* 2.5*    Recent Labs Lab 02/14/17 1411  LIPASE 20    Recent Labs Lab 02/14/17 1411 02/15/17 0607  AMMONIA 18 48*   CBC:  Recent Labs Lab 02/14/17 1411 02/15/17 0607 02/16/17 0438  WBC 15.7* 11.7* 8.9  HGB 9.0* 8.2* 8.8*  HCT 27.2* 25.1* 26.5*  MCV 77.6* 77.0* 78.7*  PLT 135* 135* 131*   Cardiac Enzymes:  Recent Labs Lab 02/14/17 1411 02/16/17 0438  CKTOTAL 784* 146  TROPONINI <0.03  --      Recent Results (from the past 240 hour(s))  Urine culture     Status: Abnormal   Collection Time: 02/14/17  2:11 PM  Result Value Ref Range Status   Specimen Description URINE, CLEAN CATCH  Final   Special Requests Normal  Final   Culture (A)  Final    <10,000 COLONIES/mL INSIGNIFICANT GROWTH Performed at [facility], [address], [place], [place] [address]    Report Status 02/15/2017 FINAL  Final  Culture, blood (Routine X 2) w Reflex to ID Panel     Status: None (Preliminary result)   Collection Time: 02/14/17  3:16 PM  Result Value Ref Range Status   Specimen Description BLOOD RIGHT ARM  Final   Special Requests   Final    BOTTLES DRAWN AEROBIC AND ANAEROBIC Blood Culture results may not be optimal due to an  excessive volume of blood received in culture bottles   Culture NO GROWTH 2 DAYS  Final   Report Status PENDING  Incomplete  Culture, blood (Routine X 2) w Reflex to ID Panel     Status: None (Preliminary result)   Collection Time: 02/14/17  3:16 PM  Result Value Ref Range Status   Specimen Description BLOOD LEFT ASSIST CONTROL  Final   Special Requests   Final    BOTTLES DRAWN AEROBIC AND ANAEROBIC Blood Culture adequate volume   Culture  Setup Time   Final    Organism ID to follow GRAM POSITIVE RODS ANAEROBIC BOTTLE ONLY CRITICAL RESULT CALLED TO, READ BACK BY AND VERIFIED WITH: Keturah Barre @ 6045 02/15/17 by [facility] Previously reported as [facility]. Notified Keturah Barre @ 4098 02/15/17 of correction. [facility].    Culture   Final    GRAM POSITIVE RODS CULTURE REINCUBATED FOR BETTER GROWTH Performed at [facility], [address] [place], [place] [address]    Report Status PENDING  Incomplete  Blood Culture ID Panel (Reflexed)     Status: Abnormal   Collection Time: 02/14/17  3:16 PM  Result Value Ref Range Status   Enterococcus species NOT DETECTED NOT DETECTED Final   Listeria monocytogenes NOT DETECTED NOT DETECTED Final   Staphylococcus species DETECTED (A) NOT DETECTED Final    Comment: Methicillin (oxacillin) resistant coagulase negative staphylococcus. Possible blood culture contaminant (unless isolated from more than one blood culture draw or clinical case suggests pathogenicity). No antibiotic treatment is indicated for blood  culture contaminants. CRITICAL RESULT CALLED TO, READ BACK BY AND VERIFIED WITH: Keturah Barre @ 7829 02/15/17 by [facility]    Staphylococcus aureus NOT DETECTED NOT DETECTED Final   Methicillin resistance DETECTED (A) NOT DETECTED Final    Comment: CRITICAL RESULT CALLED TO, READ BACK BY AND VERIFIED WITH: Keturah Barre @ 5621 02/15/17 by [facility]    Streptococcus species NOT DETECTED NOT DETECTED Final   Streptococcus agalactiae NOT DETECTED NOT DETECTED Final    Streptococcus pneumoniae NOT DETECTED NOT DETECTED Final   Streptococcus pyogenes NOT DETECTED NOT DETECTED Final   Acinetobacter baumannii NOT DETECTED NOT DETECTED Final   Enterobacteriaceae species NOT DETECTED NOT DETECTED Final   Enterobacter cloacae complex NOT DETECTED NOT DETECTED Final   Escherichia coli NOT DETECTED NOT DETECTED Final   Klebsiella oxytoca NOT DETECTED NOT DETECTED Final   Klebsiella pneumoniae NOT DETECTED NOT DETECTED Final   Proteus species NOT DETECTED NOT DETECTED Final   Serratia marcescens NOT DETECTED NOT DETECTED Final   Haemophilus influenzae NOT DETECTED  NOT DETECTED Final   Neisseria meningitidis NOT DETECTED NOT DETECTED Final   Pseudomonas aeruginosa NOT DETECTED NOT DETECTED Final   Candida albicans NOT DETECTED NOT DETECTED Final   Candida glabrata NOT DETECTED NOT DETECTED Final   Candida krusei NOT DETECTED NOT DETECTED Final   Candida parapsilosis NOT DETECTED NOT DETECTED Final   Candida tropicalis NOT DETECTED NOT DETECTED Final  MRSA PCR Screening     Status: Abnormal   Collection Time: 02/14/17  6:21 PM  Result Value Ref Range Status   MRSA by PCR POSITIVE (A) NEGATIVE Final    Comment:        The GeneXpert MRSA Assay (FDA approved for NASAL specimens only), is one component of a comprehensive MRSA colonization surveillance program. It is not intended to diagnose MRSA infection nor to guide or monitor treatment for MRSA infections. CRITICAL RESULT CALLED TO, READ BACK BY AND VERIFIED WITH: CASEY [facility] WARD AT 1948 ON 02/14/2017 JLJ      Studies: Dg Chest Port 1 View  Result Date: 02/15/2017 CLINICAL DATA:  71 y/o  M; cough with possible aspiration. EXAM: PORTABLE CHEST 1 VIEW COMPARISON:  02/15/2017 chest radiograph. FINDINGS: Stable cardiac silhouette. Aortic atherosclerosis with calcification. Stable patchy opacity in left lung base may represent atelectasis, pneumonia, or aspiration. No pleural effusion. No pneumothorax. No  acute osseous abnormality is evident. IMPRESSION: Stable patchy opacification of the left lung base may be due to atelectasis, pneumonia, or aspiration. Electronically Signed   By: Mitzi Hansen M.D.   On: 02/15/2017 23:49   Dg Chest Port 1 View  Result Date: 02/15/2017 CLINICAL DATA:  Cough tonight EXAM: PORTABLE CHEST 1 VIEW COMPARISON:  02/14/2017 FINDINGS: Patchy opacity in the left base could represent infectious infiltrate. The right lung is clear. The pulmonary vasculature is normal per IMPRESSION: Lateral left base patchy airspace consolidation, possibly pneumonia. Followup PA and lateral chest X-ray is recommended in 3-4 weeks following trial of antibiotic therapy to ensure resolution and exclude underlying malignancy. Electronically Signed   By: Ellery Plunk M.D.   On: 02/15/2017 01:51    Scheduled Meds: . ceFEPime (MAXIPIME) IV  2 g Intravenous Q12H  . Chlorhexidine Gluconate Cloth  6 each Topical Q0600  . feeding supplement (ENSURE ENLIVE)  237 mL Oral BID BM  . heparin  5,000 Units Subcutaneous Q8H  . lactulose  300 mL Rectal BID  . mouth rinse  15 mL Mouth Rinse BID  . mupirocin ointment  1 application Nasal BID  . nadolol  20 mg Oral Daily  . pantoprazole (PROTONIX) IV  40 mg Intravenous Q24H  . vancomycin  1,250 mg Intravenous Q18H    Assessment/Plan:  1. Aspiration event and pneumonia seen in the left lower lobe. Patient started on aggressive antibiotics with vancomycin and cefepime. Positive blood cultures staph species likely contamination. Continue the vancomycin at this point. Speech therapy to evaluate the patient today. Patient currently nothing by mouth. 2. End-stage liver disease with refractory ascites. Patient receives paracentesis every week. Patient is followed by hospice. Patient is a full code at this point. Lactulose needs to be given rectally. Nadalol if able to tolerate. 3. Chronic hyponatremia, slowly improving 4. Acute encephalopathy seems  worse today 5. Mild rhabdomyolysis being found on the floor. Unable to give IV fluids secondary to shortness of breath last night 6. Weakness. Physical therapy evaluation  Code Status:     Code Status Orders        Start     Ordered  02/14/17 1620  Full code  Continuous     02/14/17 1619    Code Status History    Date Active Date Inactive Code Status Order ID Comments User Context   09/22/2016 11:04 PM 09/24/2016  8:30 PM Full Code [id number]  Tonye Royalty, DO Inpatient   07/03/2016 12:38 PM 07/08/2016  3:48 PM DNR [id number]  Enedina Finner, MD Inpatient   04/26/2016  3:36 PM 05/01/2016  6:32 PM DNR [id number]  Milagros Loll, MD ED   03/14/2016 12:30 PM 03/17/2016 10:33 PM DNR [id number]  Suan Halter, MD Inpatient   03/12/2016  8:08 PM 03/14/2016 12:29 PM Full Code [id number]  Auburn Bilberry, MD ED     Family Communication: Sister Yesterday Disposition Plan: To be determined  Antibiotics:  Vancomycin  Maxipime  Time spent: 25 minutes  Alford Highland  [employer]

## 2017-02-17 DIAGNOSIS — Z66 Do not resuscitate: Secondary | ICD-10-CM

## 2017-02-17 LAB — VANCOMYCIN, TROUGH: Vancomycin Tr: 25 ug/mL (ref 15–20)

## 2017-02-17 LAB — GLUCOSE, CAPILLARY
GLUCOSE-CAPILLARY: 122 mg/dL — AB (ref 65–99)
GLUCOSE-CAPILLARY: 127 mg/dL — AB (ref 65–99)
Glucose-Capillary: 140 mg/dL — ABNORMAL HIGH (ref 65–99)
Glucose-Capillary: 111 mg/dL — ABNORMAL HIGH (ref 65–99)
Glucose-Capillary: 128 mg/dL — ABNORMAL HIGH (ref 65–99)

## 2017-02-17 LAB — CULTURE, BLOOD (ROUTINE X 2): SPECIAL REQUESTS: ADEQUATE

## 2017-02-17 MED ORDER — VANCOMYCIN HCL 10 G IV SOLR
1250.0000 mg | INTRAVENOUS | Status: DC
  Administered 2017-02-18: 1250 mg via INTRAVENOUS
  Filled 2017-02-17: qty 1250

## 2017-02-17 MED ORDER — VANCOMYCIN HCL 10 G IV SOLR: 1250.0000 mg | INTRAVENOUS | Status: DC

## 2017-02-17 MED ORDER — LORAZEPAM 1 MG PO TABS
1.0000 mg | ORAL_TABLET | Freq: Three times a day (TID) | ORAL | Status: DC | PRN
  Administered 2017-02-17 – 2017-02-18 (×2): 1 mg via ORAL
  Filled 2017-02-17 (×3): qty 1

## 2017-02-17 MED ORDER — LACTULOSE 10 GM/15ML PO SOLN
30.0000 g | Freq: Two times a day (BID) | ORAL | Status: DC
Start: 2017-02-17 — End: 2017-02-19
  Administered 2017-02-17 – 2017-02-19 (×4): 30 g via ORAL
  Filled 2017-02-17 (×4): qty 60

## 2017-02-17 NOTE — Progress Notes (Signed)
Daily Progress Note   Patient Name: Jeff Preston       Date: 02/17/2017 DOB: Nov 03, 1946  Age: 71 y.o. MRN#: [id number] Attending Physician: Epifanio Lesches, MD Primary Care Physician: [facility] Admit Date: 02/14/2017  Reason for Consultation/Follow-up: Establishing goals of care with end stage liver disease on hospice care. Palliative asked to help clarify goals.   Subjective: Mr. Karge is lying in bed. Fairly uncomfortable at the moment. No family at bedside. Complains of LUQ pain as well as his modified diet.   Length of Stay: 3  Current Medications: Scheduled Meds:  . ceFEPime (MAXIPIME) IV  2 g Intravenous Q12H  . Chlorhexidine Gluconate Cloth  6 each Topical Q0600  . ciprofloxacin  1 drop Both Eyes Q4H while awake  . heparin  5,000 Units Subcutaneous Q8H  . lactulose  300 mL Rectal BID  . mouth rinse  15 mL Mouth Rinse BID  . mupirocin ointment  1 application Nasal BID  . nadolol  20 mg Oral Daily  . pantoprazole (PROTONIX) IV  40 mg Intravenous Q24H  . vancomycin  1,250 mg Intravenous Q18H    Continuous Infusions:   PRN Meds: acetaminophen **OR** acetaminophen, ipratropium-albuterol, LORazepam, ondansetron **OR** ondansetron (ZOFRAN) IV, simethicone  Physical Exam  Constitutional: He is oriented to person, place, and time. He appears well-developed.  HENT:  Head: Normocephalic and atraumatic.  Cardiovascular: Normal rate.   Pulmonary/Chest: Effort normal. No accessory muscle usage. No tachypnea. No respiratory distress.  Abdominal: He exhibits distension.  Neurological: He is alert and oriented to person, place, and time.  Speech slowed and delayed.   Nursing note and vitals reviewed.           Vital Signs: BP (!) 113/53 (BP Location: Left Arm)    Pulse 81   Temp 98.7 F (37.1 C) (Oral)   Resp 18   Ht 5' 11.5" (1.816 m)   Wt 101.6 kg (224 lb)   SpO2 100%   BMI 30.81 kg/m  SpO2: SpO2: 100 % O2 Device: O2 Device: Not Delivered O2 Flow Rate: O2 Flow Rate (L/min): 2 L/min  Intake/output summary:  Intake/Output Summary (Last 24 hours) at 02/17/17 1442 Last data filed at 02/17/17 0541  Gross per 24 hour  Intake  350 ml  Output              350 ml  Net                0 ml   LBM: Last BM Date: 02/17/17 Baseline Weight: Weight: 101.6 kg (224 lb) Most recent weight: Weight: 101.6 kg (224 lb)       Palliative Assessment/Data:    Flowsheet Rows     Most Recent Value  Intake Tab  Referral Department  Hospitalist  Unit at Time of Referral  Oncology Unit  Palliative Care Primary Diagnosis  Other (Comment) [ESLD]  Date Notified  02/14/17  Palliative Care Type  New Palliative care  Reason for referral  Clarify Goals of Care  Date of Admission  02/14/17  # of days IP prior to Palliative referral  0  Clinical Assessment  Psychosocial & Spiritual Assessment  Palliative Care Outcomes      Patient Active Problem List   Diagnosis Date Noted  . Goals of care, counseling/discussion   . Palliative care encounter   . Altered mental status 02/14/2017  . Depression 09/23/2016  . Benzodiazepine withdrawal ([place]) 09/22/2016  . Palliative care by specialist   . DNR (do not resuscitate)   . Hepatic cirrhosis ([place]) 04/28/2016  . Hyponatremia 04/26/2016  . Umbilical hernia with gangrene   . Open wound of umbilical region   . Ascites 03/12/2016    Palliative Care Assessment & Plan   HPI: 71 y.o. male  with past medical history of end stage alcoholic cirrhosis, recurrent ascites requiring weekly paracentesis, chronic hyponatremia, depression/anxiety, HTN, GERD, umbilical hernia admitted on 02/14/2017 after his son found him on the floor of their home. Per notes he has been increasingly weak with poor appetite.     Assessment: I met again today with Mr. Coggeshall. Unfortunately his swallow evaluation did not go well. He is upset about his diet. We had a long discussion regarding his declining health, weakness, and now dysphagia. We discussed that these are signs that he is approaching EOL. He is tearful but understands his condition.   Throughout our discussion he agrees with DNR and focus on comfort. He requests lorazepam to help with anxiety. He also says "if I'm a dying man I should be able to have some ice water." We discussed consequences of decisions and he would like a more liberalized diet. He is also open to hospice facility at discharge. He does not want for his son to find him dead in their home.   I have left message with his sister to discuss. I will speak with his son Patrick Jupiter regarding these decisions and plans tomorrow 0900 am.   Recommendations/Plan:  Anxiety: Lorazepam 1 mg TID prn - may increase as needed.   Liberalize diet  LUQ pain: No constipation. Consider paracentesis.   Goals of Care and Additional Recommendations:  Limitations on Scope of Treatment: Avoid Hospitalization and Full Comfort Care  Code Status:  DNR  Prognosis:   < 2 weeks likely with dysphagia and high risk of aspiration with all consistencies.   Discharge Planning:  Recommend hospice facility   Thank you for allowing the Palliative Medicine Team to assist in the care of this patient.   Total Time 23mn Prolonged Time Billed  no       Greater than 50%  of this time was spent counseling and coordinating care related to the above assessment and plan.  AVinie Sill NP Palliative Medicine Team Pager # [phone](  M-F 8a-5p) Team Phone # [phone] (Nights/Weekends)

## 2017-02-17 NOTE — Progress Notes (Signed)
Initial Nutrition Assessment  DOCUMENTATION CODES:   Non-severe (moderate) malnutrition in context of chronic illness  INTERVENTION:  Discontinued Ensure Enlive as it is not nectar-thick.  Provide Magic cup TID with meals, each supplement provides 290 kcal and 9 grams of protein.   Patient would benefit from 1:1 assistance with meals.  NUTRITION DIAGNOSIS:   Malnutrition (Moderate) related to chronic illness (cirrhosis, ascites) as evidenced by mild depletion of body fat, moderate depletion of body fat, mild depletion of muscle mass, moderate depletions of muscle mass.  GOAL:   Patient will meet greater than or equal to 90% of their needs  MONITOR:   PO intake, Supplement acceptance, Labs, Weight trends, I & O's  REASON FOR ASSESSMENT:   Low Braden    ASSESSMENT:   71 year old male with PMHx of HTN, depression, GERD, cirrhosis, ascites receiving paracentesis weekly, chronic hyponatremia presented with altered mental status after being found on floor. Patient found to have mild rhabdomyolysis, PNA, and is now on strict aspiration precautions after aspiration event.   -Patient followed by [facility] at home.  Attempted to speak with patient at bedside. He was unable to give a complete history due to confusion and lethargy. He reports his appetite is good and that he has been eating well. However, per HPI patient's sister had reported he has had a poor appetite and has become increasingly weak. Patient unable to provide further details on recent intake.   Patient reports he has not been losing any weight but is unsure of UBW. Per chart patient's weight appears to fluctuate recently between 200-224 lbs likely related to fluid changes. Current weight likely falsely elevated in setting of ascites. Lowest weight of 175 lbs from 07/03/2016.   Medications reviewed and include: ciprofloxacin, lactulose, pantoprazole, vancomycin, simethicone  PRN.  Labs reviewed: CBG 101-140 past 24 hrs, Sodium 131, BUN 37, Creatinine 1.85.   Nutrition-Focused physical exam completed. Findings are mild-moderate fat depletion (except for severe depletion in orbital region), mild-moderate muscle depletion (except for severe in temple region), and no edema. Abdomen distended. Patient edentulous.  Diet Order:  DIET - DYS 1 Room service appropriate? Yes with Assist; Fluid consistency: Nectar Thick  Skin:  Wound (see comment) (MSAD - unknown location)  Last BM:  02/17/2017 - type 7 incontinent per chart  Height:   Ht Readings from Last 1 Encounters:  02/14/17 5' 11.5" (1.816 m)    Weight:   Wt Readings from Last 1 Encounters:  02/15/17 224 lb (101.6 kg)    Ideal Body Weight:  79.5 kg  BMI:  Body mass index is 30.81 kg/m.  Estimated Nutritional Needs:   Kcal:  2175-2360 (MSJ x 1.2-1.3)  Protein:  100-120 grams (1-1.2 grams/kg)  Fluid:  2.1 L/day  EDUCATION NEEDS:   Education needs no appropriate at this time  Helane Rima, MS, RD, LDN Pager: [phone] After Hours Pager: [phone]

## 2017-02-17 NOTE — Progress Notes (Signed)
Pharmacy Antibiotic Note  Jeff Preston is a 71 y.o. male admitted on 02/14/2017 with pneumonia.  Pharmacy has been consulted for vancomycin and cefepime dosing.  Plan: DW 80kg  Vd 56L kei 0.038 hr-1  t1/2 18 hours Vancomycin 1250 mg q 18 hours ordered with stacked dosing. Level before 5th dose. Goal trough 15-20.  Cefepime 2 grams q 12 hours ordered.  4/10: VT @ 20:17 = 25 mcg/mL Will adjust dose to vancomycin 1250 mg IV Q24H to resume 4/11 @ 0400.  Will draw next trough before 3rd new dose on 4/13 @ 0330.   Height: 5' 11.5" (181.6 cm) Weight: 224 lb (101.6 kg) IBW/kg (Calculated) : 76.45  Temp (24hrs), Avg:98.6 F (37 C), Min:98.4 F (36.9 C), Max:98.7 F (37.1 C)   Recent Labs Lab 02/14/17 1411 02/14/17 1640 02/15/17 0607 02/16/17 0438 02/17/17 2017  WBC 15.7*  --  11.7* 8.9  --   CREATININE 1.99*  --  1.71* 1.85*  --   LATICACIDVEN 2.1* 1.7  --   --   --   VANCOTROUGH  --   --   --   --  25*    Estimated Creatinine Clearance: 46.1 mL/min (A) (by C-G formula based on SCr of 1.85 mg/dL (H)).    Allergies  Allergen Reactions  . Dilantin [Phenytoin Sodium Extended] Rash  . Penicillins Rash and Other (See Comments)    Has patient had a PCN reaction causing immediate rash, facial/tongue/throat swelling, SOB or lightheadedness with hypotension: No Has patient had a PCN reaction causing severe rash involving mucus membranes or skin necrosis: No Has patient had a PCN reaction that required hospitalization No Has patient had a PCN reaction occurring within the last 10 years: No If all of the above answers are "NO", then may proceed with Cephalosporin use.    Antimicrobials this admission: vancomycin cefepime 4/8 >>    >>   Dose adjustments this admission:   Microbiology results: 4/7 BCx: pending 4/7 UCx: pending  4/7 MRSA PCR: (+)      4/7 CXR: L base consolidation 4/7 UA: (-) Thank you for allowing pharmacy to be a part of this patient's  care.  Rhylan Gross D 02/17/2017 9:00 PM

## 2017-02-17 NOTE — Progress Notes (Signed)
Visit made. Patient seen sitting up in bed, alert, dinner tray in front of him. He is able to feed himself slowly. He continues to report left upper quadrant pain which worsens "when they roll me". It does not hurt with deep breaths or swallowing. Per chart note review after his meeting with Palliative NP Yong Channel his diet has been liberalized and have thin liquids as well. Helmut Muster also discussed the possibility of discharging to a hospice facility, she will meet with his son Bishop tomorrow morning. He is now a DNR. Will continue to follow and update hospice team. Thank you. Dayna Barker RN, BSN, [facility] [place], hospital Liaison [phone] c

## 2017-02-17 NOTE — Progress Notes (Signed)
Patient ID: Jeff Preston, male   DOB: [birth year], 71 y.o.   MRN: [id number]   [facility] PROGRESS NOTE  KIREN MCISAAC WUJ:[id number] DOB: December 07, 1945 DOA: 02/14/2017 PCP: [facility]  HPI/Subjective:  More alert, awake.c/o l left upper quadrant pain. No nausea or vomiting.  Objective: Vitals:   02/17/17 0447 02/17/17 0809  BP: (!) 118/45 130/64  Pulse: 99 98  Resp: 20 18  Temp: 98.6 F (37 C) 98.4 F (36.9 C)    Filed Weights   02/15/17 0305  Weight: 101.6 kg (224 lb)    ROS: Review of Systems  Constitutional: Negative for chills and fever.  HENT: Negative for hearing loss.   Eyes: Negative for blurred vision, double vision and photophobia.  Respiratory: Negative for cough, hemoptysis and shortness of breath.   Cardiovascular: Negative for chest pain, palpitations, orthopnea and leg swelling.  Gastrointestinal: Positive for abdominal pain. Negative for diarrhea and vomiting.  Genitourinary: Negative for dysuria and urgency.  Musculoskeletal: Negative for myalgias and neck pain.  Skin: Negative for rash.  Neurological: Negative for dizziness, focal weakness, seizures, weakness and headaches.  Psychiatric/Behavioral: Negative for memory loss. The patient does not have insomnia.    Exam: Physical Exam  Constitutional: He is oriented to person, place, and time.  HENT:  Nose: No mucosal edema.  Mouth/Throat: No oropharyngeal exudate or posterior oropharyngeal edema.  Eyes: Conjunctivae, EOM and lids are normal. Pupils are equal, round, and reactive to light.  Neck: No JVD present. Carotid bruit is not present. No edema present. No thyroid mass and no thyromegaly present.  Cardiovascular: S1 normal and S2 normal.  Exam reveals no gallop.   No murmur heard. Pulses:      Dorsalis pedis pulses are 2+ on the right side, and 2+ on the left side.  Respiratory: No respiratory distress. He has no wheezes. He has no rhonchi. He has no rales.  GI: Soft. Bowel  sounds are normal. He exhibits distension. There is no tenderness. A hernia is present.  Genitourinary:  Genitourinary Comments: 4+ scrotal swelling  Musculoskeletal:       Right ankle: He exhibits swelling.       Left ankle: He exhibits swelling.  Lymphadenopathy:    He has no cervical adenopathy.  Neurological: He is alert and oriented to person, place, and time. No cranial nerve deficit.  Skin: Skin is warm. No rash noted. Nails show no clubbing.  Some dried blood from the back of the right ear.  Psychiatric: He has a normal mood and affect.      Data Reviewed: Basic Metabolic Panel:  Recent Labs Lab 02/12/17 1047 02/14/17 1411 02/15/17 0607 02/16/17 0438  NA 128* 125* 129* 131*  K  --  4.2 3.9 3.9  CL  --  94* 97* 102  CO2  --  GLUCOSE  --  89 84 138*  BUN  --  30* 34* 37*  CREATININE  --  1.99* 1.71* 1.85*  CALCIUM  --  7.7* 7.4* 7.3*   Liver Function Tests:  Recent Labs Lab 02/14/17 1411 02/15/17 0607  AST 102* 71*  ALT 24 22  ALKPHOS 78 75  BILITOT 2.7* 2.7*  PROT 5.6* 5.4*  ALBUMIN 2.5* 2.5*    Recent Labs Lab 02/14/17 1411  LIPASE 20    Recent Labs Lab 02/14/17 1411 02/15/17 0607  AMMONIA 18 48*   CBC:  Recent Labs Lab 02/14/17 1411 02/15/17 0607 02/16/17 0438  WBC 15.7* 11.7* 8.9  HGB 9.0* 8.2* 8.8*  HCT 27.2* 25.1* 26.5*  MCV 77.6* 77.0* 78.7*  PLT 135* 135* 131*   Cardiac Enzymes:  Recent Labs Lab 02/14/17 1411 02/16/17 0438  CKTOTAL 784* 146  TROPONINI <0.03  --      Recent Results (from the past 240 hour(s))  Urine culture     Status: Abnormal   Collection Time: 02/14/17  2:11 PM  Result Value Ref Range Status   Specimen Description URINE, CLEAN CATCH  Final   Special Requests Normal  Final   Culture (A)  Final    <10,000 COLONIES/mL INSIGNIFICANT GROWTH Performed at [facility], [address] [place], [place] [address]    Report Status 02/15/2017 FINAL  Final  Culture, blood (Routine X 2)  w Reflex to ID Panel     Status: None (Preliminary result)   Collection Time: 02/14/17  3:16 PM  Result Value Ref Range Status   Specimen Description BLOOD RIGHT ARM  Final   Special Requests   Final    BOTTLES DRAWN AEROBIC AND ANAEROBIC Blood Culture results may not be optimal due to an excessive volume of blood received in culture bottles   Culture NO GROWTH 3 DAYS  Final   Report Status PENDING  Incomplete  Culture, blood (Routine X 2) w Reflex to ID Panel     Status: None (Preliminary result)   Collection Time: 02/14/17  3:16 PM  Result Value Ref Range Status   Specimen Description BLOOD LEFT ASSIST CONTROL  Final   Special Requests   Final    BOTTLES DRAWN AEROBIC AND ANAEROBIC Blood Culture adequate volume   Culture  Setup Time   Final    Organism ID to follow GRAM POSITIVE RODS ANAEROBIC BOTTLE ONLY CRITICAL RESULT CALLED TO, READ BACK BY AND VERIFIED WITH: Keturah Barre @ 6045 02/15/17 by [facility] Previously reported as GPC. Notified Keturah Barre @ 4098 02/15/17 of correction. TCH.    Culture   Final    GRAM POSITIVE RODS CULTURE REINCUBATED FOR BETTER GROWTH Performed at [facility], [address], [place], [place] [address]    Report Status PENDING  Incomplete  Blood Culture ID Panel (Reflexed)     Status: Abnormal   Collection Time: 02/14/17  3:16 PM  Result Value Ref Range Status   Enterococcus species NOT DETECTED NOT DETECTED Final   Listeria monocytogenes NOT DETECTED NOT DETECTED Final   Staphylococcus species DETECTED (A) NOT DETECTED Final    Comment: Methicillin (oxacillin) resistant coagulase negative staphylococcus. Possible blood culture contaminant (unless isolated from more than one blood culture draw or clinical case suggests pathogenicity). No antibiotic treatment is indicated for blood  culture contaminants. CRITICAL RESULT CALLED TO, READ BACK BY AND VERIFIED WITH: Keturah Barre @ 7829 02/15/17 by TCH    Staphylococcus aureus NOT DETECTED NOT DETECTED Final    Methicillin resistance DETECTED (A) NOT DETECTED Final    Comment: CRITICAL RESULT CALLED TO, READ BACK BY AND VERIFIED WITH: Keturah Barre @ 5621 02/15/17 by TCH    Streptococcus species NOT DETECTED NOT DETECTED Final   Streptococcus agalactiae NOT DETECTED NOT DETECTED Final   Streptococcus pneumoniae NOT DETECTED NOT DETECTED Final   Streptococcus pyogenes NOT DETECTED NOT DETECTED Final   Acinetobacter baumannii NOT DETECTED NOT DETECTED Final   Enterobacteriaceae species NOT DETECTED NOT DETECTED Final   Enterobacter cloacae complex NOT DETECTED NOT DETECTED Final   Escherichia coli NOT DETECTED NOT DETECTED Final   Klebsiella oxytoca NOT DETECTED  NOT DETECTED Final   Klebsiella pneumoniae NOT DETECTED NOT DETECTED Final   Proteus species NOT DETECTED NOT DETECTED Final   Serratia marcescens NOT DETECTED NOT DETECTED Final   Haemophilus influenzae NOT DETECTED NOT DETECTED Final   Neisseria meningitidis NOT DETECTED NOT DETECTED Final   Pseudomonas aeruginosa NOT DETECTED NOT DETECTED Final   Candida albicans NOT DETECTED NOT DETECTED Final   Candida glabrata NOT DETECTED NOT DETECTED Final   Candida krusei NOT DETECTED NOT DETECTED Final   Candida parapsilosis NOT DETECTED NOT DETECTED Final   Candida tropicalis NOT DETECTED NOT DETECTED Final  MRSA PCR Screening     Status: Abnormal   Collection Time: 02/14/17  6:21 PM  Result Value Ref Range Status   MRSA by PCR POSITIVE (A) NEGATIVE Final    Comment:        The GeneXpert MRSA Assay (FDA approved for NASAL specimens only), is one component of a comprehensive MRSA colonization surveillance program. It is not intended to diagnose MRSA infection nor to guide or monitor treatment for MRSA infections. CRITICAL RESULT CALLED TO, READ BACK BY AND VERIFIED WITH: CASEY [facility] WARD AT 1948 ON 02/14/2017 JLJ      Studies: Dg Chest Port 1 View  Result Date: 02/15/2017 CLINICAL DATA:  71 y/o  M; cough with possible aspiration.  EXAM: PORTABLE CHEST 1 VIEW COMPARISON:  02/15/2017 chest radiograph. FINDINGS: Stable cardiac silhouette. Aortic atherosclerosis with calcification. Stable patchy opacity in left lung base may represent atelectasis, pneumonia, or aspiration. No pleural effusion. No pneumothorax. No acute osseous abnormality is evident. IMPRESSION: Stable patchy opacification of the left lung base may be due to atelectasis, pneumonia, or aspiration. Electronically Signed   By: Mitzi Hansen M.D.   On: 02/15/2017 23:49    Scheduled Meds: . ceFEPime (MAXIPIME) IV  2 g Intravenous Q12H  . Chlorhexidine Gluconate Cloth  6 each Topical Q0600  . ciprofloxacin  1 drop Both Eyes Q4H while awake  . feeding supplement (ENSURE ENLIVE)  237 mL Oral BID BM  . heparin  5,000 Units Subcutaneous Q8H  . lactulose  300 mL Rectal BID  . mouth rinse  15 mL Mouth Rinse BID  . mupirocin ointment  1 application Nasal BID  . nadolol  20 mg Oral Daily  . pantoprazole (PROTONIX) IV  40 mg Intravenous Q24H  . vancomycin  1,250 mg Intravenous Q18H    Assessment/Plan:  1. Aspiration event and pneumonia seen in the left lower lobe. Patient started on aggressive antibiotics with vancomycin and cefepime.  The new vancomycin, wait for final culture data   2. . Follow full aspiration precautions, continue dysphagia 1 diet as per speech therapy recommendation, spoke to [place] from speech, patient wants to be full code but the family understand and they are okay with the DO NOT RESUSCITATE. So. Plan is consulted to get  Discussion  About advanced directives, last paracentesis was on March 30. Liters of fluid received.  3.End-stage liver disease with refractory ascites. Patient receives paracentesis every week. Patient is followed by hospice.   4.Chronic hyponatremia, slowly improving 5.Acute hepatic encephalopathy: Received lactulose enema yesterday, but today he is alert and awake. 6.Mild rhabdomyolysis being found on the  floor. Unable to give IV fluids secondary to shortness of breath last night 7.Weakness. Physical therapy evaluation  Prognosis very poor because of end-stage alcoholic liver cirrhosis requiring multiple paracentesis, chronic hyponatremia, depression, anxiety, hypertension, GERD, umbilical hernia: Patient prognosis really poor but he wants to be full code but  patient's family understand that he is quality of life is really poor and they are okay with DO NOT RESUSCITATE so I had requested chaplain consult to discuss goals of advanced directive for him. Because he is full code we cannot give him what he wants to continue dysphagia diet.  Code Status:     Code Status Orders        Start     Ordered   02/14/17 1620  Full code  Continuous     02/14/17 1619    Code Status History    Date Active Date Inactive Code Status Order ID Comments User Context   09/22/2016 11:04 PM 09/24/2016  8:30 PM Full Code [id number]  Tonye Royalty, DO Inpatient   07/03/2016 12:38 PM 07/08/2016  3:48 PM DNR [id number]  Enedina Finner, MD Inpatient   04/26/2016  3:36 PM 05/01/2016  6:32 PM DNR [id number]  Milagros Loll, MD ED   03/14/2016 12:30 PM 03/17/2016 10:33 PM DNR [id number]  Suan Halter, MD Inpatient   03/12/2016  8:08 PM 03/14/2016 12:29 PM Full Code [id number]  Auburn Bilberry, MD ED     Family Communication: Sister Yesterday Disposition Plan: To be determined  Antibiotics:  Vancomycin  Maxipime  Time spent: 25 minutes  [employer]

## 2017-02-17 NOTE — Progress Notes (Signed)
Pt tolerated one cup of Nectar thickened water by the spoonful with assist.

## 2017-02-17 NOTE — Progress Notes (Signed)
Good results from Lactulose enema earlier in the shift. Pt began to cough with nectar thick liquids. Later pt  took po Simethicone prn for c/o gas pains with some improvement.

## 2017-02-17 NOTE — Progress Notes (Signed)
PT Cancellation Note  Patient Details Name: Jeff Preston MRN: [id number] DOB: 1946-06-03   Cancelled Treatment:    Reason Eval/Treat Not Completed: Patient declined, no reason specified   Pt offered and encouraged to participate x 2 this am.  Pt refused both times "I'm tired" "I had a rough night last night"  Risks and benefits explained but he continued to refuse.  Will continue as appropriate.    Danielle Dess 02/17/2017, 10:52 AM

## 2017-02-18 ENCOUNTER — Inpatient Hospital Stay

## 2017-02-18 LAB — BODY FLUID CELL COUNT WITH DIFFERENTIAL
Eos, Fluid: 0 %
Lymphs, Fluid: 12 %
MONOCYTE-MACROPHAGE-SEROUS FLUID: 14 %
Neutrophil Count, Fluid: 74 %
Total Nucleated Cell Count, Fluid: 2262 cu mm

## 2017-02-18 LAB — PATHOLOGIST SMEAR REVIEW

## 2017-02-18 LAB — GLUCOSE, CAPILLARY: Glucose-Capillary: 106 mg/dL — ABNORMAL HIGH (ref 65–99)

## 2017-02-18 MED ORDER — LORAZEPAM 1 MG PO TABS
1.0000 mg | ORAL_TABLET | ORAL | Status: DC | PRN
  Administered 2017-02-18 – 2017-02-19 (×3): 1 mg via ORAL
  Filled 2017-02-18 (×3): qty 1

## 2017-02-18 MED ORDER — OXYCODONE HCL 5 MG PO TABS
5.0000 mg | ORAL_TABLET | ORAL | Status: DC | PRN
  Administered 2017-02-18 – 2017-02-19 (×4): 5 mg via ORAL
  Filled 2017-02-18 (×4): qty 1

## 2017-02-18 MED ORDER — MORPHINE SULFATE (CONCENTRATE) 10 MG/0.5ML PO SOLN: 5.0000 mg | ORAL | Status: DC | PRN

## 2017-02-18 NOTE — Progress Notes (Signed)
Speech Therapy Note: reviewed chart notes; consulted w/ Palliative Care nurse who consulted w/ pt re: status and poc goals; pt does have end stage alcoholic cirrhosis and recurrent ascites. Post thorough discussion w/ Palliative Care nurse, pt stated "if I'm a dying man I should be able to have some ice water." Consequences of such decisions were discussed, and he would like a more liberalized diet. He is also open to hospice facility at discharge. Palliative Care nurse upgraded pt's diet to a Dysphagia 3(chopped meats) diet. ST services left aspiration precautions in room as were discussed w/ him. ST services will sign off at this time; NSG to reconsult if any further needs. Palliative Care nurse agreed.    Jerilynn Som, MS, CCC-SLP

## 2017-02-18 NOTE — Progress Notes (Signed)
Visit made. Patient seen sitting up in bed, lunch tray in front of him, he was able to eat some. He continues with a slight cough and throat clearing with every swallow. He and his sister met earlier with Palliative NP Vinie Sill who discussed the transfer to the hospice home, both are agreeable. Writer spoke with patient again about the transfer at this visit. He understands this will not take place until tomorrow. He is requiring oxycodone for left upper quadrant pain as well as lorazepam for anxiety. He had a thoracentesis this morning with 5.2 liters drawn off and reports he feels better. Hospital care team all aware of discharge plan. Hospice team updated. Will continue to follow. Thank you. Flo Shanks RN,BSN, [facility] [place], [facility] [phone] c

## 2017-02-18 NOTE — Procedures (Signed)
US guided paracentesis.  Removed 5.2 liters.  No immediate complication.  Minimal blood loss.

## 2017-02-18 NOTE — Progress Notes (Signed)
Patient ID: Jeff Preston, male   DOB: 1946-03-24, 71 y.o.   MRN: [id number]   [employer] PROGRESS NOTE  AMADOR BRADDY NWG:[id number] DOB: 02-06-1946 DOA: 02/14/2017 [facility]: [facility]  HPI/Subjective:   At the bedside, alert, awake, oriented complaints of left upper quadrant abdominal pain. No shortness of breath.  Objective: Vitals:   02/17/17 1946 02/18/17 0506  BP: 115/60 126/68  Pulse: 85 87  Resp: 20 20  Temp: 98.7 F (37.1 C) 98.3 F (36.8 C)    Filed Weights   02/15/17 0305  Weight: 101.6 kg (224 lb)    ROS: Review of Systems  Constitutional: Negative for chills and fever.  HENT: Negative for hearing loss.   Eyes: Negative for blurred vision, double vision and photophobia.  Respiratory: Negative for cough, hemoptysis and shortness of breath.   Cardiovascular: Negative for chest pain, palpitations, orthopnea and leg swelling.  Gastrointestinal: Positive for abdominal pain. Negative for diarrhea and vomiting.  Genitourinary: Negative for dysuria and urgency.  Musculoskeletal: Negative for myalgias and neck pain.  Skin: Negative for rash.  Neurological: Negative for dizziness, focal weakness, seizures, weakness and headaches.  Psychiatric/Behavioral: Negative for memory loss. The patient does not have insomnia.    Exam: Physical Exam  Constitutional: He is oriented to person, place, and time.  HENT:  Nose: No mucosal edema.  Mouth/Throat: No oropharyngeal exudate or posterior oropharyngeal edema.  Eyes: Conjunctivae, EOM and lids are normal. Pupils are equal, round, and reactive to light.  Neck: No JVD present. Carotid bruit is not present. No edema present. No thyroid mass and no thyromegaly present.  Cardiovascular: S1 normal and S2 normal.  Exam reveals no gallop.   No murmur heard. Pulses:      Dorsalis pedis pulses are 2+ on the right side, and 2+ on the left side.  Respiratory: No respiratory distress. He has no wheezes. He has no  rhonchi. He has no rales.  GI: Soft. Bowel sounds are normal. He exhibits distension. There is no tenderness. A hernia is present.  Genitourinary:  Genitourinary Comments: 4+ scrotal swelling  Musculoskeletal:       Right ankle: He exhibits swelling.       Left ankle: He exhibits swelling.  Lymphadenopathy:    He has no cervical adenopathy.  Neurological: He is alert and oriented to person, place, and time. No cranial nerve deficit.  Skin: Skin is warm. No rash noted. Nails show no clubbing.  Some dried blood from the back of the right ear.  Psychiatric: He has a normal mood and affect.      Data Reviewed: Basic Metabolic Panel:  Recent Labs Lab 02/12/17 1047 02/14/17 1411 02/15/17 0607 02/16/17 0438  NA 128* 125* 129* 131*  K  --  4.2 3.9 3.9  CL  --  94* 97* 102  CO2  --  GLUCOSE  --  89 84 138*  BUN  --  30* 34* 37*  CREATININE  --  1.99* 1.71* 1.85*  CALCIUM  --  7.7* 7.4* 7.3*   Liver Function Tests:  Recent Labs Lab 02/14/17 1411 02/15/17 0607  AST 102* 71*  ALT 24 22  ALKPHOS 78 75  BILITOT 2.7* 2.7*  PROT 5.6* 5.4*  ALBUMIN 2.5* 2.5*    Recent Labs Lab 02/14/17 1411  LIPASE 20    Recent Labs Lab 02/14/17 1411 02/15/17 0607  AMMONIA 18 48*   CBC:  Recent Labs Lab 02/14/17 1411 02/15/17 5784 02/16/17 6962  WBC 15.7* 11.7* 8.9  HGB 9.0* 8.2* 8.8*  HCT 27.2* 25.1* 26.5*  MCV 77.6* 77.0* 78.7*  PLT 135* 135* 131*   Cardiac Enzymes:  Recent Labs Lab 02/14/17 1411 02/16/17 0438  CKTOTAL 784* 146  TROPONINI <0.03  --      Recent Results (from the past 240 hour(s))  Urine culture     Status: Abnormal   Collection Time: 02/14/17  2:11 PM  Result Value Ref Range Status   Specimen Description URINE, CLEAN CATCH  Final   Special Requests Normal  Final   Culture (A)  Final    <10,000 COLONIES/mL INSIGNIFICANT GROWTH Performed at [facility], [address] [place], [place] [address]    Report Status 02/15/2017  FINAL  Final  Culture, blood (Routine X 2) w Reflex to ID Panel     Status: None (Preliminary result)   Collection Time: 02/14/17  3:16 PM  Result Value Ref Range Status   Specimen Description BLOOD RIGHT ARM  Final   Special Requests   Final    BOTTLES DRAWN AEROBIC AND ANAEROBIC Blood Culture results may not be optimal due to an excessive volume of blood received in culture bottles   Culture NO GROWTH 4 DAYS  Final   Report Status PENDING  Incomplete  Culture, blood (Routine X 2) w Reflex to ID Panel     Status: Abnormal   Collection Time: 02/14/17  3:16 PM  Result Value Ref Range Status   Specimen Description BLOOD LEFT ASSIST CONTROL  Final   Special Requests   Final    BOTTLES DRAWN AEROBIC AND ANAEROBIC Blood Culture adequate volume   Culture  Setup Time   Final    Organism ID to follow GRAM POSITIVE RODS ANAEROBIC BOTTLE ONLY CRITICAL RESULT CALLED TO, READ BACK BY AND VERIFIED WITH: Keturah Barre @ 6045 02/15/17 by [employer] Previously reported as GPC. Notified Keturah Barre @ 4098 02/15/17 of correction. TCH.    Culture (A)  Final    CLOSTRIDIUM PERFRINGENS STAPHYLOCOCCUS EPIDERMIDIS THE SIGNIFICANCE OF ISOLATING THIS ORGANISM FROM A SINGLE SET OF BLOOD CULTURES WHEN MULTIPLE SETS ARE DRAWN IS UNCERTAIN. PLEASE NOTIFY THE MICROBIOLOGY DEPARTMENT WITHIN ONE WEEK IF SPECIATION AND SENSITIVITIES ARE REQUIRED. Performed at [facility], [address] [place], [place] [address]    Report Status 02/17/2017 FINAL  Final  Blood Culture ID Panel (Reflexed)     Status: Abnormal   Collection Time: 02/14/17  3:16 PM  Result Value Ref Range Status   Enterococcus species NOT DETECTED NOT DETECTED Final   Listeria monocytogenes NOT DETECTED NOT DETECTED Final   Staphylococcus species DETECTED (A) NOT DETECTED Final    Comment: Methicillin (oxacillin) resistant coagulase negative staphylococcus. Possible blood culture contaminant (unless isolated from more than one blood culture draw or  clinical case suggests pathogenicity). No antibiotic treatment is indicated for blood  culture contaminants. CRITICAL RESULT CALLED TO, READ BACK BY AND VERIFIED WITH: Keturah Barre @ 7829 02/15/17 by TCH    Staphylococcus aureus NOT DETECTED NOT DETECTED Final   Methicillin resistance DETECTED (A) NOT DETECTED Final    Comment: CRITICAL RESULT CALLED TO, READ BACK BY AND VERIFIED WITH: Keturah Barre @ 5621 02/15/17 by TCH    Streptococcus species NOT DETECTED NOT DETECTED Final   Streptococcus agalactiae NOT DETECTED NOT DETECTED Final   Streptococcus pneumoniae NOT DETECTED NOT DETECTED Final   Streptococcus pyogenes NOT DETECTED NOT DETECTED Final   Acinetobacter baumannii NOT DETECTED NOT DETECTED Final  Enterobacteriaceae species NOT DETECTED NOT DETECTED Final   Enterobacter cloacae complex NOT DETECTED NOT DETECTED Final   Escherichia coli NOT DETECTED NOT DETECTED Final   Klebsiella oxytoca NOT DETECTED NOT DETECTED Final   Klebsiella pneumoniae NOT DETECTED NOT DETECTED Final   Proteus species NOT DETECTED NOT DETECTED Final   Serratia marcescens NOT DETECTED NOT DETECTED Final   Haemophilus influenzae NOT DETECTED NOT DETECTED Final   Neisseria meningitidis NOT DETECTED NOT DETECTED Final   Pseudomonas aeruginosa NOT DETECTED NOT DETECTED Final   Candida albicans NOT DETECTED NOT DETECTED Final   Candida glabrata NOT DETECTED NOT DETECTED Final   Candida krusei NOT DETECTED NOT DETECTED Final   Candida parapsilosis NOT DETECTED NOT DETECTED Final   Candida tropicalis NOT DETECTED NOT DETECTED Final  MRSA PCR Screening     Status: Abnormal   Collection Time: 02/14/17  6:21 PM  Result Value Ref Range Status   MRSA by PCR POSITIVE (A) NEGATIVE Final    Comment:        The GeneXpert MRSA Assay (FDA approved for NASAL specimens only), is one component of a comprehensive MRSA colonization surveillance program. It is not intended to diagnose MRSA infection nor to guide  or monitor treatment for MRSA infections. CRITICAL RESULT CALLED TO, READ BACK BY AND VERIFIED WITH: CASEY VAUGHN WARD AT 1948 ON 02/14/2017 JLJ      Studies: No results found.  Scheduled Meds: . ceFEPime (MAXIPIME) IV  2 g Intravenous Q12H  . Chlorhexidine Gluconate Cloth  6 each Topical Q0600  . ciprofloxacin  1 drop Both Eyes Q4H while awake  . heparin  5,000 Units Subcutaneous Q8H  . lactulose  30 g Oral BID  . mouth rinse  15 mL Mouth Rinse BID  . mupirocin ointment  1 application Nasal BID  . nadolol  20 mg Oral Daily  . pantoprazole (PROTONIX) IV  40 mg Intravenous Q24H  . vancomycin  1,250 mg Intravenous Q24H    Assessment/Plan:  Aspiration event and pneumonia seen in the left lower lobe. Patient started on aggressive antibiotics with vancomycin and cefepime.   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 Follow full aspiration precautions, patient has Clostridium  perfringes in blood/ S pending. 3.End-stage liver disease with refractory ascites. Patient receives paracentesis every week. Patient is followed by hospice.   to get the therapeutic paracentesis today,   4.Chronic hyponatremia, slowly improving 5.Acute hepatic encephalopathy: Received lactulose enema . today he is alert and awake. 6.Mild rhabdomyolysis being found on the floor. Unable to give IV fluids secondary to shortness of breath   7.Weakness. 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  Code Status:     Code Status Orders        Start     Ordered   02/14/17 1620  Full code  Continuous     02/14/17 1619    Code Status History    Date Active Date Inactive Code Status Order  ID Comments User Context   09/22/2016 11:04 PM 09/24/2016  8:30 PM Full Code [id number]  Tonye Royalty, DO Inpatient   07/03/2016 12:38 PM 07/08/2016  3:48 PM DNR [id number]  Enedina Finner, MD Inpatient   04/26/2016  3:36 PM 05/01/2016  6:32 PM DNR [id number]  Milagros Loll, MD ED   03/14/2016 12:30 PM 03/17/2016 10:33 PM DNR [id number]  Suan Halter, MD Inpatient   03/12/2016  8:08 PM 03/14/2016 12:29 PM Full Code [id number]  Auburn Bilberry, MD ED     Family Communication: Sister Yesterday Disposition Plan: To be determined  Antibiotics:  Vancomycin  Maxipime  Time spent: 25 minutes  [employer]

## 2017-02-19 DIAGNOSIS — R1310 Dysphagia, unspecified: Secondary | ICD-10-CM

## 2017-02-19 DIAGNOSIS — Z7189 Other specified counseling: Secondary | ICD-10-CM

## 2017-02-19 DIAGNOSIS — K7031 Alcoholic cirrhosis of liver with ascites: Secondary | ICD-10-CM

## 2017-02-19 DIAGNOSIS — Z515 Encounter for palliative care: Secondary | ICD-10-CM

## 2017-02-19 LAB — CULTURE, BLOOD (ROUTINE X 2): CULTURE: NO GROWTH

## 2017-02-19 MED ORDER — LORAZEPAM 1 MG PO TABS: 1.0000 mg | ORAL_TABLET | ORAL | 0 refills | Status: AC | PRN

## 2017-02-19 MED ORDER — MORPHINE SULFATE (CONCENTRATE) 10 MG/0.5ML PO SOLN: 5.0000 mg | ORAL | 0 refills | Status: AC | PRN

## 2017-02-19 NOTE — Clinical Social Work Note (Signed)
CSW consulted for [facility]. Pt is an [facility] pt. Pt is in need of residential hospice services. [facility] is able to accept pt today as bed will likely be today. CSW prepared discharge packet. Hospice Liaison will make arrangements for transfer. CSW is signing off as no further needs identified.   Dede Query, MSW LCSW  Clinical Socai Worker [phone]

## 2017-02-19 NOTE — Progress Notes (Signed)
Visit made. Patient seen sitting up in bed. Visit made with Palliative Medicine NP Yong Channel. He appears pale and somewhat sleepy, also noted to be itching on his arms and hands. Discussed with patient that his weekly ascites draining may not continue  at the hospice home. Discharged to home is not an option d/t his decline. Plan continues for discharge to the hospice home, with a plan for a paracentesis if patient becomes symptomatic and cannot be managed with medications. Patient agreeable to plan. Hospital care team all aware of an in agreement with discharge today via EMS. Signed DNR in place in discharge packet. Patient's son Deniece Portela and sister [place] in during visit and are aware of and in agreement the plan. Report called to the hospice home, EMS notified for transport. Discharge summary faxed to referral. Thank you. Dayna Barker RN, BSN, [facility], hospital  Liaison [phone]

## 2017-02-19 NOTE — Progress Notes (Signed)
Patient discharged to Hospice Home per MD order. EMS called for transport.

## 2017-02-19 NOTE — Progress Notes (Signed)
Daily Progress Note   Patient Name: Jeff Preston       Date: 02/19/2017 DOB: 1946/05/04  Age: 71 y.o. MRN#: [id number] Attending Physician: Epifanio Lesches, MD Primary Care Physician: [facility] Admit Date: 02/14/2017  Reason for Consultation/Follow-up: Establishing goals of care with end stage liver disease on hospice care. Palliative asked to help clarify goals.   Subjective: [name] is much unchanged, lying in bed.   Length of Stay: 5  Current Medications: Scheduled Meds:  . lactulose  30 g Oral BID  . mouth rinse  15 mL Mouth Rinse BID  . mupirocin ointment  1 application Nasal BID  . pantoprazole (PROTONIX) IV  40 mg Intravenous Q24H    Continuous Infusions:   PRN Meds: acetaminophen **OR** acetaminophen, ipratropium-albuterol, LORazepam, morphine CONCENTRATE, ondansetron **OR** ondansetron (ZOFRAN) IV, oxyCODONE, simethicone  Physical Exam  Constitutional: He is oriented to person, place, and time. He appears well-developed.  HENT:  Head: Normocephalic and atraumatic.  Cardiovascular: Normal rate.   Pulmonary/Chest: Effort normal. No accessory muscle usage. No tachypnea. No respiratory distress.  Abdominal: He exhibits distension.  Neurological: He is alert and oriented to person, place, and time.  Speech slowed and delayed.   Nursing note and vitals reviewed.           Vital Signs: BP 140/65 (BP Location: Left Arm)   Pulse 79   Temp 98.7 F (37.1 C) (Oral)   Resp 18   Ht 5' 11.5" (1.816 m)   Wt 101.6 kg (224 lb)   SpO2 98%   BMI 30.81 kg/m  SpO2: SpO2: 98 % O2 Device: O2 Device: Not Delivered O2 Flow Rate: O2 Flow Rate (L/min): 2 L/min  Intake/output summary:   Intake/Output Summary (Last 24 hours) at 02/19/17 1041 Last data filed  at 02/19/17 0538  Gross per 24 hour  Intake              120 ml  Output              600 ml  Net             -480 ml   LBM: Last BM Date: 02/18/17 Baseline Weight: Weight: 101.6 kg (224 lb) Most recent weight: Weight: 101.6 kg (224 lb)       Palliative Assessment/Data:  Flowsheet Rows     Most Recent Value  Intake Tab  Referral Department  Hospitalist  Unit at Time of Referral  Oncology Unit  Palliative Care Primary Diagnosis  Other (Comment) [ESLD]  Date Notified  02/14/17  Palliative Care Type  New Palliative care  Reason for referral  Clarify Goals of Care  Date of Admission  02/14/17  # of days IP prior to Palliative referral  0  Clinical Assessment  Psychosocial & Spiritual Assessment  Palliative Care Outcomes      Patient Active Problem List   Diagnosis Date Noted  . Dysphagia   . Goals of care, counseling/discussion   . Palliative care encounter   . Altered mental status 02/14/2017  . Depression 09/23/2016  . Benzodiazepine withdrawal ([place]) 09/22/2016  . Palliative care by specialist   . DNR (do not resuscitate)   . Hepatic cirrhosis ([place]) 04/28/2016  . Hyponatremia 04/26/2016  . Umbilical hernia with gangrene   . Open wound of umbilical region   . Ascites 03/12/2016    Palliative Care Assessment & Plan   HPI: 71 y.o. male  with past medical history of end stage alcoholic cirrhosis, recurrent ascites requiring weekly paracentesis, chronic hyponatremia, depression/anxiety, HTN, GERD, umbilical hernia admitted on 02/14/2017 after his son found him on the floor of their home. Per notes he has been increasingly weak with poor appetite.    Assessment: Unfortunately [name] was taken for paracentesis before we could change to have peritoneal drain placed instead. I have spoken with his son and sister regarding transition to comfort care while liberalizing diet and transition to hospice facility. I met today with [name] as well as [name], RN hospice  liaison. [name] has assisted to work out that he may be considered for further paracentesis for comfort even from hospice facility if his prognosis and QOL allow for this. This was the only issue with his transition to hospice facility. Appreciate [name] and [facility] working and taking care of Mr. Coke in his individualized care plan. He is comfortable with his transition to hospice as he trusts them to care for him and is ready to leave the hospital. Emotional support provided.   Recommendations/Plan:  Anxiety: Lorazepam 1 mg every 4 hours prn.   Liberalize diet  LUQ pain: OxyIR 5 mg every 3 hours prn.   Dyspnea/severe pain: Roxanol 5 mg every 2 hours prn.     Goals of Care and Additional Recommendations:  Limitations on Scope of Treatment: Avoid Hospitalization and Full Comfort Care  Code Status:  DNR  Prognosis:   < 2 weeks likely with dysphagia and high risk of aspiration with all consistencies.   Discharge Planning:  Recommend hospice facility   Thank you for allowing the Palliative Medicine Team to assist in the care of this patient.   Total Time 40mn Prolonged Time Billed  no       Greater than 50%  of this time was spent counseling and coordinating care related to the above assessment and plan.  AVinie Sill NP Palliative Medicine Team Pager # [phone](M-F 8a-5p) Team Phone # [phone](Nights/Weekends)

## 2017-02-19 NOTE — Progress Notes (Signed)
Daily Progress Note   Patient Name: Jeff Preston       Date: 02/19/2017 DOB: 07-22-1946  Age: 71 y.o. MRN#: [id number] Attending Physician: Epifanio Lesches, MD Primary Care Physician: [facility] Admit Date: 02/14/2017  Reason for Consultation/Follow-up: Establishing goals of care with end stage liver disease on hospice care. Palliative asked to help clarify goals.   Subjective: Jeff Preston is lying in bed, has had paracentesis but still having LUQ pain. Complains of continued anxiety as well.   Length of Stay: 5  Current Medications: Scheduled Meds:  . lactulose  30 g Oral BID  . mouth rinse  15 mL Mouth Rinse BID  . mupirocin ointment  1 application Nasal BID  . pantoprazole (PROTONIX) IV  40 mg Intravenous Q24H    Continuous Infusions:   PRN Meds: acetaminophen **OR** acetaminophen, ipratropium-albuterol, LORazepam, morphine CONCENTRATE, ondansetron **OR** ondansetron (ZOFRAN) IV, oxyCODONE, simethicone  Physical Exam  Constitutional: He is oriented to person, place, and time. He appears well-developed.  HENT:  Head: Normocephalic and atraumatic.  Cardiovascular: Normal rate.   Pulmonary/Chest: Effort normal. No accessory muscle usage. No tachypnea. No respiratory distress.  Abdominal: He exhibits distension.  Neurological: He is alert and oriented to person, place, and time.  Speech slowed and delayed.   Nursing note and vitals reviewed.           Vital Signs: BP 140/65 (BP Location: Left Arm)   Pulse 79   Temp 98.7 F (37.1 C) (Oral)   Resp 18   Ht 5' 11.5" (1.816 m)   Wt 101.6 kg (224 lb)   SpO2 98%   BMI 30.81 kg/m  SpO2: SpO2: 98 % O2 Device: O2 Device: Not Delivered O2 Flow Rate: O2 Flow Rate (L/min): 2 L/min  Intake/output summary:    Intake/Output Summary (Last 24 hours) at 02/19/17 0742 Last data filed at 02/19/17 0538  Gross per 24 hour  Intake              120 ml  Output              600 ml  Net             -480 ml   LBM: Last BM Date: 02/18/17 Baseline Weight: Weight: 101.6 kg (224 lb) Most recent weight: Weight: 101.6 kg (224  lb)       Palliative Assessment/Data:    Flowsheet Rows     Most Recent Value  Intake Tab  Referral Department  Hospitalist  Unit at Time of Referral  Oncology Unit  Palliative Care Primary Diagnosis  Other (Comment) [ESLD]  Date Notified  02/14/17  Palliative Care Type  New Palliative care  Reason for referral  Clarify Goals of Care  Date of Admission  02/14/17  # of days IP prior to Palliative referral  0  Clinical Assessment  Psychosocial & Spiritual Assessment  Palliative Care Outcomes      Patient Active Problem List   Diagnosis Date Noted  . Goals of care, counseling/discussion   . Palliative care encounter   . Altered mental status 02/14/2017  . Depression 09/23/2016  . Benzodiazepine withdrawal ([place]) 09/22/2016  . Palliative care by specialist   . DNR (do not resuscitate)   . Hepatic cirrhosis ([place]) 04/28/2016  . Hyponatremia 04/26/2016  . Umbilical hernia with gangrene   . Open wound of umbilical region   . Ascites 03/12/2016    Palliative Care Assessment & Plan   HPI: 71 y.o. male  with past medical history of end stage alcoholic cirrhosis, recurrent ascites requiring weekly paracentesis, chronic hyponatremia, depression/anxiety, HTN, GERD, umbilical hernia admitted on 02/14/2017 after his son found him on the floor of their home. Per notes he has been increasingly weak with poor appetite.    Assessment: I met again today with Jeff Preston. He continues with weak cough and has significant coughing after I refilled his ice water. He eats his spaghetti without signs of overt aspiration. He continues to be tearful but understanding of his situation. He is  ready to get out of the hospital. He feels peace with going to hospital as they have been good to him at home.  I spoke with his son Patrick [place] via phone and his sister in person separately. They both understand his poor health and agree with these decisions of DNR, comfort, liberalized diet, and transition to hospice. Sister, [place], is more tearful and saying she wants to keep him around as long as she can but she also understands his declining health.   Recommendations/Plan:  Anxiety: Lorazepam 1 mg every 4 hours prn.   Liberalize diet  LUQ pain: OxyIR 5 mg every 3 hours prn.   Dyspnea/severe pain: Roxanol 5 mg every 2 hours prn.     Goals of Care and Additional Recommendations:  Limitations on Scope of Treatment: Avoid Hospitalization and Full Comfort Care  Code Status:  DNR  Prognosis:   < 2 weeks likely with dysphagia and high risk of aspiration with all consistencies.   Discharge Planning:  Recommend hospice facility   Thank you for allowing the Palliative Medicine Team to assist in the care of this patient.   Total Time 52mn Prolonged Time Billed  no       Greater than 50%  of this time was spent counseling and coordinating care related to the above assessment and plan.  AVinie Sill NP Palliative Medicine Team Pager # [phone](M-F 8a-5p) Team Phone # [phone](Nights/Weekends)

## 2017-02-19 NOTE — Discharge Summary (Signed)
Jeff Preston, is a 71 y.o. male  DOB October 10, 1946  MRN [id number].  Admission date:  02/14/2017  Admitting Physician  Houston Siren, MD  Discharge Date:  02/19/2017   Primary MD  [facility]  Recommendations for primary care physician for things to follow:     Admission Diagnosis  Dehydration [E86.0] Confusion [R41.0] General weakness [R53.1] AKI (acute kidney injury) (HCC) [N17.9]   Discharge Diagnosis  Dehydration [E86.0] Confusion [R41.0] General weakness [R53.1] AKI (acute kidney injury) (HCC) [N17.9]    Active Problems:   Altered mental status   Goals of care, counseling/discussion   Palliative care encounter      Past Medical History:  Diagnosis Date  . Anxiety   . Anxiety   . Ascites   . Chronic hyponatremia   . Cirrhosis (HCC)   . Depression   . DNR (do not resuscitate)   . GERD (gastroesophageal reflux disease)   . Hypertension   . Umbilical hernia     Past Surgical History:  Procedure Laterality Date  . CHOLECYSTECTOMY    . COLON SURGERY    . COLOSTOMY N/A   . THROAT SURGERY         History of present illness and  Hospital Course:     Kindly see H&P for history of present illness and admission details, please review complete Labs, Consult reports and Test reports for all details in brief  HPI  from the history and physical done on the day of admission 71 year old male patient with LIVER cirrhosis, ascites week gets weekly paracentesis came in because of generalized weakness, patient was found on the floor by patient's son. And he also was disoriented.   Hospital Course  #1 altered mental status likely hepatic encephalopathy: Patient ammonia was normal when he came but the continued on now lactulose patient CT head didn't show any acute changes. Patient had aspiration  pneumonia in the left lung, received vancomycin, cefepime. Patient seen by palliative care, speech therapy, because of recurrent admissions with the hepatic encephalopathy, ascites patient chose DO NOT RESUSCITATE status, patient is qualified for hospice placement, he will go to hospice home today.  #2 chronic hyponatremia: #3 acute hepatic encephalopathy: Patient received lactulose any mass. #4 liver cirrhosis with ascites, best paracentesis every week, patient did have paracentesis yesterday and they removed 0.2 L of fluid from his stoma, patient feels better today. 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 Patient and family  Are in agreement for hospice home today.  Discharge Condition:stable  Follow UP      Discharge Instructions  and  Discharge Medications      Allergies as of 02/19/2017      Reactions   Dilantin [phenytoin Sodium Extended] Rash   Penicillins Rash, Other (See Comments)   Has patient had a PCN reaction causing immediate rash, facial/tongue/throat swelling, SOB or lightheadedness with hypotension: No Has patient had a PCN reaction causing severe rash involving mucus membranes or skin necrosis: No Has patient had a PCN reaction that required hospitalization No Has patient had a PCN reaction occurring within the last 10 years: No If all of the above answers are "NO", then may proceed with Cephalosporin use.      Medication List    STOP taking these medications   chlordiazePOXIDE 25 MG capsule Commonly known as:  LIBRIUM   mirtazapine 15 MG tablet Commonly known as:  REMERON   sodium chloride 1  g tablet   traMADol 50 MG tablet Commonly known as:  ULTRAM     TAKE these medications   bisacodyl 5 MG EC tablet Commonly known as:  DULCOLAX Take 1 tablet (5 mg total) by mouth daily as needed for moderate constipation.   feeding supplement (ENSURE ENLIVE) Liqd Take  237 mLs by mouth 2 (two) times daily between meals.   furosemide 40 MG tablet Commonly known as:  LASIX Take 1 tablet (40 mg total) by mouth daily.   lactulose 10 GM/15ML solution Commonly known as:  CHRONULAC Take 15 mLs (10 g total) by mouth daily.   LORazepam 1 MG tablet Commonly known as:  ATIVAN Take 1 tablet (1 mg total) by mouth every 4 (four) hours as needed for anxiety or sleep. What changed:  when to take this  reasons to take this   morphine CONCENTRATE 10 MG/0.5ML Soln concentrated solution Take 0.25 mLs (5 mg total) by mouth every 2 (two) hours as needed for severe pain or shortness of breath.   nadolol 20 MG tablet Commonly known as:  CORGARD Take 20 mg by mouth daily.   omeprazole 20 MG capsule Commonly known as:  PRILOSEC Take 20 mg by mouth 2 (two) times daily.   potassium chloride SA 20 MEQ tablet Commonly known as:  K-DUR,KLOR-CON Take 20 mEq by mouth 2 (two) times daily.   simethicone 80 MG chewable tablet Commonly known as:  MYLICON Chew 160-240 mg by mouth every 6 (six) hours as needed for flatulence.   spironolactone 50 MG tablet Commonly known as:  ALDACTONE Take 50 mg by mouth daily.         Diet and Activity recommendation: See Discharge Instructions above   Consults obtained - palliative care team, speech therapy, hospice   Major procedures and Radiology Reports - PLEASE review detailed and final reports for all details, in brief -     Ct Head Wo Contrast  Result Date: 02/14/2017 CLINICAL DATA:  Fall, found down. EXAM: CT HEAD WITHOUT CONTRAST CT CERVICAL SPINE WITHOUT CONTRAST TECHNIQUE: Multidetector CT imaging of the head and cervical spine was performed following the standard protocol without intravenous contrast. Multiplanar CT image reconstructions of the cervical spine were also generated. COMPARISON:  Head CT dated 09/22/2016. FINDINGS: CT HEAD FINDINGS Brain: Mild generalized parenchymal atrophy with commensurate dilatation  of the ventricles and sulci. No mass, hemorrhage, edema or other evidence of acute parenchymal abnormality. No extra-axial hemorrhage. Vascular: There are chronic calcified atherosclerotic changes of the large vessels at the skull base. No unexpected hyperdense vessel. Skull: Normal. Negative for fracture or focal lesion. Sinuses/Orbits: No acute finding. Other: None. CT CERVICAL SPINE FINDINGS Alignment: Mild levoscoliosis of the lower cervical spine. No evidence of acute vertebral body subluxation. Skull base and vertebrae: No fracture line or displaced fracture fragment. Facet joints appear intact and normally aligned throughout. Soft tissues and spinal canal: No prevertebral fluid or swelling. No visible canal hematoma. Disc levels: Mild degenerative spurring within the mid and lower cervical spine, associated mild disc-osteophytic bulge at C5-6. No more than mild central canal stenosis at any level. Upper chest: No acute findings. Other: Carotid atherosclerosis. IMPRESSION: 1. No acute intracranial abnormality. No intracranial mass, hemorrhage or edema. No skull fracture. 2. No fracture or acute subluxation within the cervical spine. Mild degenerative change within the mid and lower cervical spine. Mild scoliosis. 3. Carotid atherosclerosis. Electronically Signed   By: Bary Richard M.D.   On: 02/14/2017 14:22   Ct Cervical Spine  Wo Contrast  Result Date: 02/14/2017 CLINICAL DATA:  Fall, found down. EXAM: CT HEAD WITHOUT CONTRAST CT CERVICAL SPINE WITHOUT CONTRAST TECHNIQUE: Multidetector CT imaging of the head and cervical spine was performed following the standard protocol without intravenous contrast. Multiplanar CT image reconstructions of the cervical spine were also generated. COMPARISON:  Head CT dated 09/22/2016. FINDINGS: CT HEAD FINDINGS Brain: Mild generalized parenchymal atrophy with commensurate dilatation of the ventricles and sulci. No mass, hemorrhage, edema or other evidence of acute  parenchymal abnormality. No extra-axial hemorrhage. Vascular: There are chronic calcified atherosclerotic changes of the large vessels at the skull base. No unexpected hyperdense vessel. Skull: Normal. Negative for fracture or focal lesion. Sinuses/Orbits: No acute finding. Other: None. CT CERVICAL SPINE FINDINGS Alignment: Mild levoscoliosis of the lower cervical spine. No evidence of acute vertebral body subluxation. Skull base and vertebrae: No fracture line or displaced fracture fragment. Facet joints appear intact and normally aligned throughout. Soft tissues and spinal canal: No prevertebral fluid or swelling. No visible canal hematoma. Disc levels: Mild degenerative spurring within the mid and lower cervical spine, associated mild disc-osteophytic bulge at C5-6. No more than mild central canal stenosis at any level. Upper chest: No acute findings. Other: Carotid atherosclerosis. IMPRESSION: 1. No acute intracranial abnormality. No intracranial mass, hemorrhage or edema. No skull fracture. 2. No fracture or acute subluxation within the cervical spine. Mild degenerative change within the mid and lower cervical spine. Mild scoliosis. 3. Carotid atherosclerosis. Electronically Signed   By: Bary Richard M.D.   On: 02/14/2017 14:22   US Paracentesis  Addendum Date: 02/18/2017   ADDENDUM REPORT: 02/18/2017 14:38 ADDENDUM: Correction:  1% lidocaine WITHOUT epinephrine was used. Electronically Signed   By: Richarda Overlie M.D.   On: 02/18/2017 14:38   Result Date: 02/18/2017 INDICATION: Recurrent ascites and cirrhosis. EXAM: ULTRASOUND GUIDED PARACENTESIS MEDICATIONS: None. COMPLICATIONS: None immediate. PROCEDURE: Informed written consent was obtained from the patient after a discussion of the risks, benefits and alternatives to treatment. A timeout was performed prior to the initiation of the procedure. Initial ultrasound scanning demonstrates a large amount of ascites within the left lower abdominal quadrant. The  left lower abdomen was prepped and draped in the usual sterile fashion. 1% lidocaine with epinephrine was used for local anesthesia. Following this, a 6 Fr Safe-T-Centesis catheter was introduced. An ultrasound image was saved for documentation purposes. The paracentesis was performed. The catheter was removed and a dressing was applied. The patient tolerated the procedure well without immediate post procedural complication. FINDINGS: A total of approximately 5.2 L of yellow fluid was removed. Samples were sent to the laboratory as requested by the clinical team. IMPRESSION: Successful ultrasound-guided paracentesis yielding 5.2 liters of peritoneal fluid. Electronically Signed: By: Richarda Overlie M.D. On: 02/18/2017 14:19   US Paracentesis  Result Date: 02/12/2017 INDICATION: Cirrhosis and ascites. EXAM: ULTRASOUND GUIDED PARACENTESIS MEDICATIONS: None. COMPLICATIONS: None immediate. PROCEDURE: Informed written consent was obtained from the patient after a discussion of the risks, benefits and alternatives to treatment. A timeout was performed prior to the initiation of the procedure. Initial ultrasound was performed to localize ascites. The left lower abdomen was prepped and draped in the usual sterile fashion. 1% lidocaine was used for local anesthesia. Following this, a 6 Fr Safe-T-Centesis catheter was introduced. An ultrasound image was saved for documentation purposes. The paracentesis was performed. The catheter was removed and a dressing was applied. The patient tolerated the procedure well without immediate post procedural complication. FINDINGS: A total of approximately  6.5 L of fluid was removed. IMPRESSION: Successful ultrasound-guided paracentesis yielding 6.5 liters of peritoneal fluid. Electronically Signed   By: Irish Lack M.D.   On: 02/12/2017 16:17   US Paracentesis  Result Date: 02/06/2017 INDICATION: Recurrent symptomatic ascites. EXAM: ULTRASOUND-GUIDED PARACENTESIS COMPARISON:   Multiple previous ultrasound-guided paracenteses, most recently on 01/30/2017 MEDICATIONS: None. COMPLICATIONS: None immediate. TECHNIQUE: Informed written consent was obtained from the patient after a discussion of the risks, benefits and alternatives to treatment. A timeout was performed prior to the initiation of the procedure. Initial ultrasound scanning demonstrates a large amount of ascites within the right lower abdominal quadrant. The right lower abdomen was prepped and draped in the usual sterile fashion. 1% lidocaine with epinephrine was used for local anesthesia. An ultrasound image was saved for documentation purposed. An 8 Fr Safe-T-Centesis catheter was introduced. The paracentesis was performed. The catheter was removed and a dressing was applied. The patient tolerated the procedure well without immediate post procedural complication. FINDINGS: A total of approximately 7 liters of serous fluid was removed. IMPRESSION: Successful ultrasound-guided paracentesis yielding 7 liters of peritoneal fluid. Electronically Signed   By: Simonne Come M.D.   On: 02/06/2017 11:38   US Paracentesis  Result Date: 01/30/2017 INDICATION: Recurrent ascites. EXAM: ULTRASOUND GUIDED PARACENTESIS MEDICATIONS: None. COMPLICATIONS: None immediate. PROCEDURE: Informed written consent was obtained from the patient after a discussion of the risks, benefits and alternatives to treatment. A timeout was performed prior to the initiation of the procedure. Initial ultrasound scanning demonstrates a large amount of ascites within the left lower abdominal quadrant. The left lower abdomen was prepped and draped in the usual sterile fashion. 1% lidocaine was used for local anesthesia. Following this, a 6 Fr Safe-T-Centesis catheter was introduced. An ultrasound image was saved for documentation purposes. The paracentesis was performed. The catheter was removed and a dressing was applied. The patient tolerated the procedure well  without immediate post procedural complication. FINDINGS: A total of approximately 6 L of milky yellow fluid was removed. IMPRESSION: Successful ultrasound-guided paracentesis yielding 6 liters of peritoneal fluid. Electronically Signed   By: Richarda Overlie M.D.   On: 01/30/2017 12:40   US Paracentesis  Result Date: 01/23/2017 CLINICAL DATA:  Liver disease, recurrent large volume abdominal ascites EXAM: ULTRASOUND GUIDED PARACENTESIS TECHNIQUE: The procedure, risks (including but not limited to bleeding, infection, organ damage ), benefits, and alternatives were explained to the patient. Questions regarding the procedure were encouraged and answered. The patient understands and consents to the procedure. Survey ultrasound of the abdomen was performed and an appropriate skin entry site in the lower left abdomen was selected. Skin site was marked, prepped with chlorhexidine, and draped in usual sterile fashion, and infiltrated locally with 1% lidocaine. A Safe-T-Centesis sheath needle was advanced into the peritoneal space until fluid could be aspirated. The sheath was advanced and the needle removed. 7.5 L of cloudy yellowascites were aspirated. COMPLICATIONS: COMPLICATIONS none IMPRESSION: Technically successful ultrasound guided paracentesis, removing 7.5 L of ascites. Electronically Signed   By: Corlis Leak M.D.   On: 01/23/2017 12:25   Dg Chest Port 1 View  Result Date: 02/15/2017 CLINICAL DATA:  71 y/o  M; cough with possible aspiration. EXAM: PORTABLE CHEST 1 VIEW COMPARISON:  02/15/2017 chest radiograph. FINDINGS: Stable cardiac silhouette. Aortic atherosclerosis with calcification. Stable patchy opacity in left lung base may represent atelectasis, pneumonia, or aspiration. No pleural effusion. No pneumothorax. No acute osseous abnormality is evident. IMPRESSION: Stable patchy opacification of the left lung base may  be due to atelectasis, pneumonia, or aspiration. Electronically Signed   By: Mitzi Hansen M.D.   On: 02/15/2017 23:49   Dg Chest Port 1 View  Result Date: 02/15/2017 CLINICAL DATA:  Cough tonight EXAM: PORTABLE CHEST 1 VIEW COMPARISON:  02/14/2017 FINDINGS: Patchy opacity in the left base could represent infectious infiltrate. The right lung is clear. The pulmonary vasculature is normal per IMPRESSION: Lateral left base patchy airspace consolidation, possibly pneumonia. Followup PA and lateral chest X-ray is recommended in 3-4 weeks following trial of antibiotic therapy to ensure resolution and exclude underlying malignancy. Electronically Signed   By: Ellery Plunk M.D.   On: 02/15/2017 01:51   Dg Chest Portable 1 View  Result Date: 02/14/2017 CLINICAL DATA:  Fall at home. EXAM: PORTABLE CHEST 1 VIEW COMPARISON:  10/25/2016 FINDINGS: Lungs are adequately inflated without focal consolidation or effusion. Borderline stable cardiomegaly. Bones and soft tissues are unchanged. IMPRESSION: No active disease. Electronically Signed   By: Elberta Fortis M.D.   On: 02/14/2017 13:55    Micro Results    Recent Results (from the past 240 hour(s))  Urine culture     Status: Abnormal   Collection Time: 02/14/17  2:11 PM  Result Value Ref Range Status   Specimen Description URINE, CLEAN CATCH  Final   Special Requests Normal  Final   Culture (A)  Final    <10,000 COLONIES/mL INSIGNIFICANT GROWTH Performed at [facility], [address], [place], [place] [address]    Report Status 02/15/2017 FINAL  Final  Culture, blood (Routine X 2) w Reflex to ID Panel     Status: None (Preliminary result)   Collection Time: 02/14/17  3:16 PM  Result Value Ref Range Status   Specimen Description BLOOD RIGHT ARM  Final   Special Requests   Final    BOTTLES DRAWN AEROBIC AND ANAEROBIC Blood Culture results may not be optimal due to an excessive volume of blood received in culture bottles   Culture NO GROWTH 4 DAYS  Final   Report Status PENDING  Incomplete  Culture, blood  (Routine X 2) w Reflex to ID Panel     Status: Abnormal   Collection Time: 02/14/17  3:16 PM  Result Value Ref Range Status   Specimen Description BLOOD LEFT ASSIST CONTROL  Final   Special Requests   Final    BOTTLES DRAWN AEROBIC AND ANAEROBIC Blood Culture adequate volume   Culture  Setup Time   Final    Organism ID to follow GRAM POSITIVE RODS ANAEROBIC BOTTLE ONLY CRITICAL RESULT CALLED TO, READ BACK BY AND VERIFIED WITH: Keturah Barre @ [id number] 02/15/17 by [facility] Previously reported as [facility]. Notified Keturah Barre @ [id number] 02/15/17 of correction. [facility].    Culture (A)  Final    CLOSTRIDIUM PERFRINGENS STAPHYLOCOCCUS EPIDERMIDIS THE SIGNIFICANCE OF ISOLATING THIS ORGANISM FROM A SINGLE SET OF BLOOD CULTURES WHEN MULTIPLE SETS ARE DRAWN IS UNCERTAIN. PLEASE NOTIFY THE MICROBIOLOGY DEPARTMENT WITHIN ONE WEEK IF SPECIATION AND SENSITIVITIES ARE REQUIRED. Performed at [facility], [address], [place], [place] [address]    Report Status 02/17/2017 FINAL  Final  Blood Culture ID Panel (Reflexed)     Status: Abnormal   Collection Time: 02/14/17  3:16 PM  Result Value Ref Range Status   Enterococcus species NOT DETECTED NOT DETECTED Final   Listeria monocytogenes NOT DETECTED NOT DETECTED Final   Staphylococcus species DETECTED (A) NOT DETECTED Final    Comment: Methicillin (oxacillin) resistant coagulase  negative staphylococcus. Possible blood culture contaminant (unless isolated from more than one blood culture draw or clinical case suggests pathogenicity). No antibiotic treatment is indicated for blood  culture contaminants. CRITICAL RESULT CALLED TO, READ BACK BY AND VERIFIED WITH: Keturah Barre @ 0981 02/15/17 by [facility]    Staphylococcus aureus NOT DETECTED NOT DETECTED Final   Methicillin resistance DETECTED (A) NOT DETECTED Final    Comment: CRITICAL RESULT CALLED TO, READ BACK BY AND VERIFIED WITH: Keturah Barre @ 1914 02/15/17 by [facility]    Streptococcus species NOT DETECTED NOT DETECTED Final    Streptococcus agalactiae NOT DETECTED NOT DETECTED Final   Streptococcus pneumoniae NOT DETECTED NOT DETECTED Final   Streptococcus pyogenes NOT DETECTED NOT DETECTED Final   Acinetobacter baumannii NOT DETECTED NOT DETECTED Final   Enterobacteriaceae species NOT DETECTED NOT DETECTED Final   Enterobacter cloacae complex NOT DETECTED NOT DETECTED Final   Escherichia coli NOT DETECTED NOT DETECTED Final   Klebsiella oxytoca NOT DETECTED NOT DETECTED Final   Klebsiella pneumoniae NOT DETECTED NOT DETECTED Final   Proteus species NOT DETECTED NOT DETECTED Final   Serratia marcescens NOT DETECTED NOT DETECTED Final   Haemophilus influenzae NOT DETECTED NOT DETECTED Final   Neisseria meningitidis NOT DETECTED NOT DETECTED Final   Pseudomonas aeruginosa NOT DETECTED NOT DETECTED Final   Candida albicans NOT DETECTED NOT DETECTED Final   Candida glabrata NOT DETECTED NOT DETECTED Final   Candida krusei NOT DETECTED NOT DETECTED Final   Candida parapsilosis NOT DETECTED NOT DETECTED Final   Candida tropicalis NOT DETECTED NOT DETECTED Final  MRSA PCR Screening     Status: Abnormal   Collection Time: 02/14/17  6:21 PM  Result Value Ref Range Status   MRSA by PCR POSITIVE (A) NEGATIVE Final    Comment:        The GeneXpert MRSA Assay (FDA approved for NASAL specimens only), is one component of a comprehensive MRSA colonization surveillance program. It is not intended to diagnose MRSA infection nor to guide or monitor treatment for MRSA infections. CRITICAL RESULT CALLED TO, READ BACK BY AND VERIFIED WITH: CASEY VAUGHN WARD AT 1948 ON 02/14/2017 JLJ   Body fluid culture     Status: None (Preliminary result)   Collection Time: 02/18/17 10:35 AM  Result Value Ref Range Status   Specimen Description PERITONEAL  Final   Special Requests NONE  Final   Gram Stain   Final    FEW WBC PRESENT, PREDOMINANTLY PMN NO ORGANISMS SEEN Performed at [facility], [address], [place],  [place] [address]    Culture PENDING  Incomplete   Report Status PENDING  Incomplete       Today   Subjective:   Jeff Preston today seen, patient feels better, less abdominal pain, is agreeable to go to hospice home today.  Objective:   Blood pressure 140/65, pulse 79, temperature 98.7 F (37.1 C), temperature source Oral, resp. rate 18, height 5' 11.5" (1.816 m), weight 101.6 kg (224 lb), SpO2 98 %.   Intake/Output Summary (Last 24 hours) at 02/19/17 0751 Last data filed at 02/19/17 0538  Gross per 24 hour  Intake              120 ml  Output              600 ml  Net             -480 ml    Exam Awake Alert, Oriented x 3, No new  F.N deficits, Normal affect Overly.AT,PERRAL Supple Neck,No JVD, No cervical lymphadenopathy appriciated.  Symmetrical Chest wall movement, Good air movement bilaterally, CTAB RRR,No Gallops,Rubs or new Murmurs, No Parasternal Heave +ve B.Sounds, Abd Soft, Non tender, No organomegaly appriciated, No rebound -guarding or rigidity. No Cyanosis, Clubbing or edema, No new Rash or bruise  Data Review   CBC w Diff: Lab Results  Component Value Date   WBC 8.9 02/16/2017   HGB 8.8 (L) 02/16/2017   HGB 10.8 (L) 03/05/2015   HCT 26.5 (L) 02/16/2017   HCT 31.9 (L) 03/05/2015   PLT 131 (L) 02/16/2017   PLT 151 03/05/2015   LYMPHOPCT 7 10/25/2016   LYMPHOPCT 11.8 03/05/2015   MONOPCT 10 10/25/2016   MONOPCT 10.3 03/05/2015   EOSPCT 1 10/25/2016   EOSPCT 2.9 03/05/2015   BASOPCT 1 10/25/2016   BASOPCT 0.5 03/05/2015    CMP: Lab Results  Component Value Date   NA 131 (L) 02/16/2017   NA 134 (L) 03/05/2015   K 3.9 02/16/2017   K 3.8 03/05/2015   CL 102 02/16/2017   CL 98 (L) 03/05/2015   CO2 23 02/16/2017   CO2 26 03/05/2015   BUN 37 (H) 02/16/2017   BUN 7 03/05/2015   CREATININE 1.85 (H) 02/16/2017   CREATININE 1.04 03/05/2015   PROT 5.4 (L) 02/15/2017   PROT 7.4 03/05/2015   ALBUMIN 2.5 (L) 02/15/2017   ALBUMIN 3.0 (L) 03/05/2015   BILITOT  2.7 (H) 02/15/2017   BILITOT 1.9 (H) 03/05/2015   ALKPHOS 75 02/15/2017   ALKPHOS 108 03/05/2015   AST 71 (H) 02/15/2017   AST 61 (H) 03/05/2015   ALT 22 02/15/2017   ALT 22 03/05/2015  .   Total Time in preparing paper work, data evaluation and todays exam - 35 minutes  Burnham Trost M.D on 02/19/2017 at 7:51 AM    Note: This dictation was prepared with Dragon dictation along with smaller phrase technology. Any transcriptional errors that result from this process are unintentional.

## 2017-02-20 ENCOUNTER — Ambulatory Visit: Admission: RE | Admit: 2017-02-20 | Source: Ambulatory Visit

## 2017-02-21 LAB — BODY FLUID CULTURE: Culture: NO GROWTH

## 2017-02-27 ENCOUNTER — Ambulatory Visit
Admission: RE | Admit: 2017-02-27 | Discharge: 2017-02-27 | Disposition: A | Discharge status: Home / Self Care | Source: Ambulatory Visit | Attending: Unknown Physician Specialty | Admitting: Unknown Physician Specialty

## 2017-02-27 MED ORDER — ALBUMIN HUMAN 25 % IV SOLN
25.0000 g | Freq: Once | INTRAVENOUS | Status: DC
  Filled 2017-02-27: qty 100

## 2017-03-03 NOTE — OR Nursing (Signed)
Called hopsice home to verify if pt will be coming anymore for paracentesis. Notified by hospice nurse he passed away. Will remove pt from weekly schedule.

## 2017-03-06 ENCOUNTER — Ambulatory Visit
Admission: RE | Admit: 2017-03-06 | Discharge: 2017-03-06 | Disposition: A | Discharge status: Home / Self Care | Source: Ambulatory Visit | Attending: Unknown Physician Specialty | Admitting: Unknown Physician Specialty

## 2017-03-10 DEATH — deceased

## 2018-03-30 IMAGING — US US PARACENTESIS
1 series · 11 of 11 positions shown · non-contrast
Comparison: none

INDICATION: 70-year-old male with a history of recurrent ascites.

[Series 1: us paracentesis · 0.28mm/px · 11 of 11 slices shown]
[im 1/11]
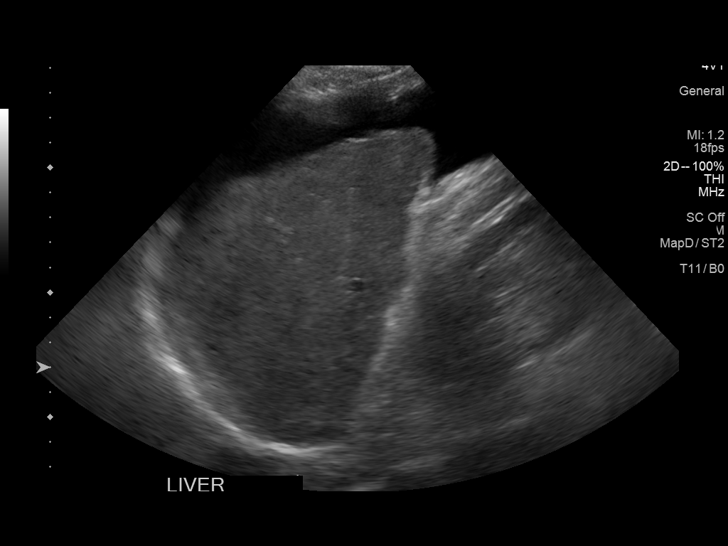
[im 2/11]
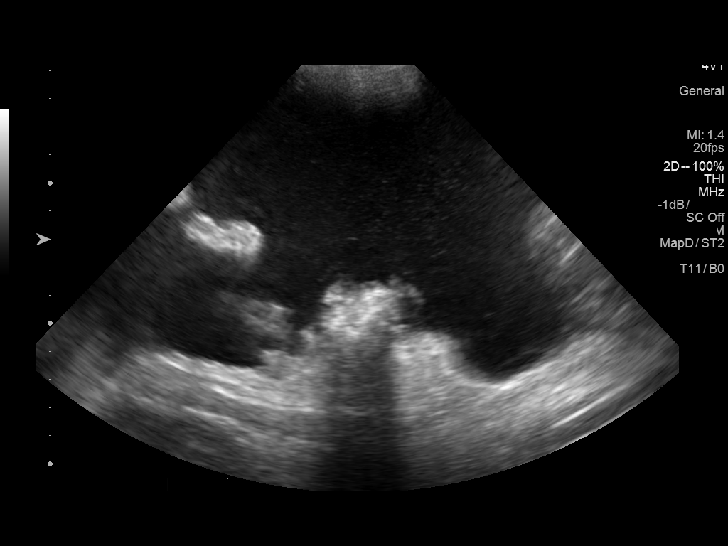
[im 3/11]
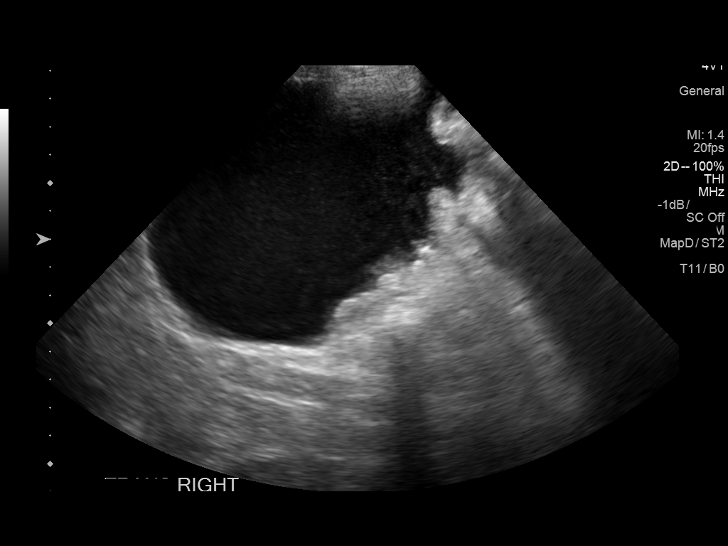
[im 4/11]
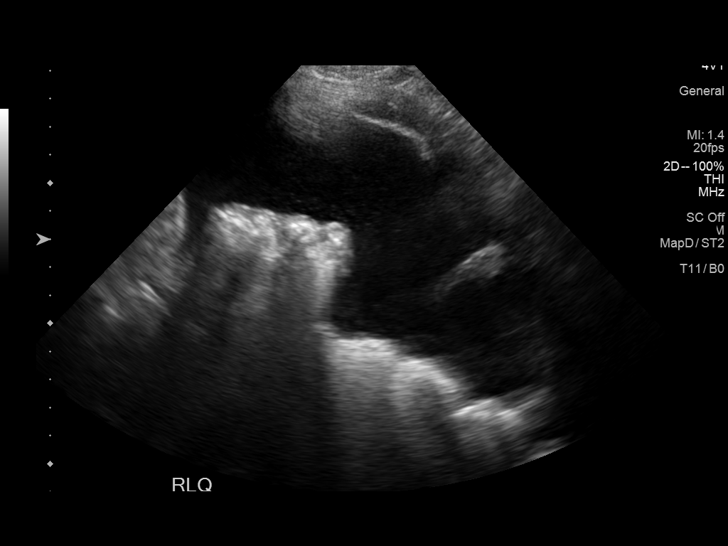
[im 5/11]
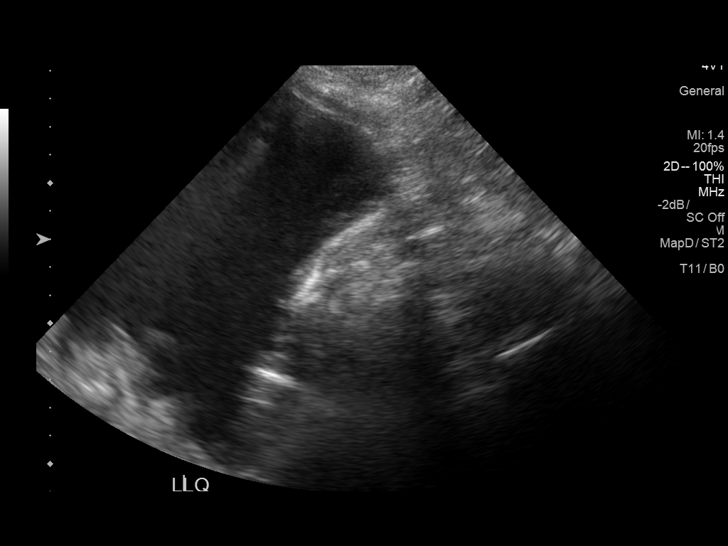
[im 6/11]
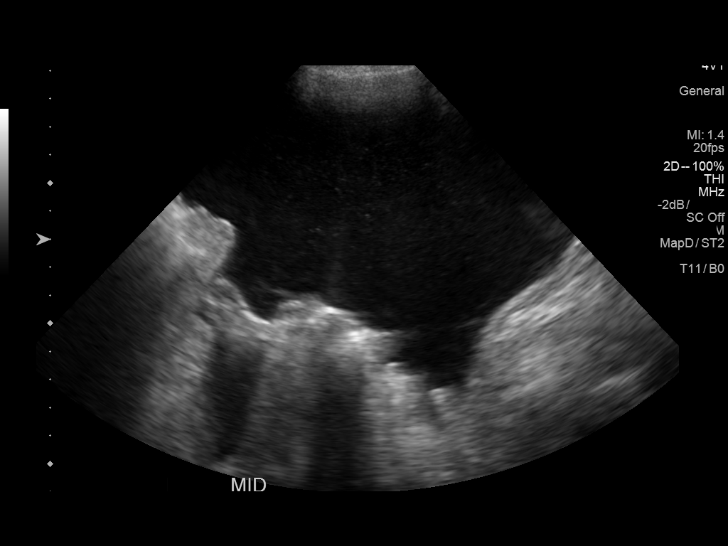
[im 7/11]
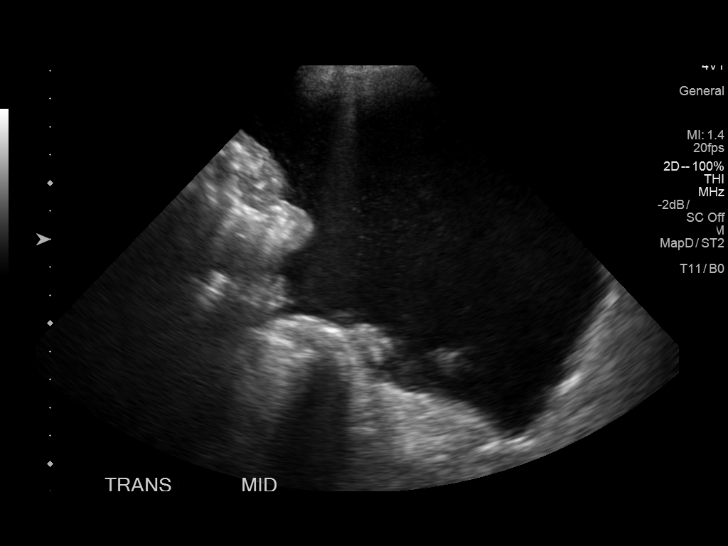
[im 8/11]
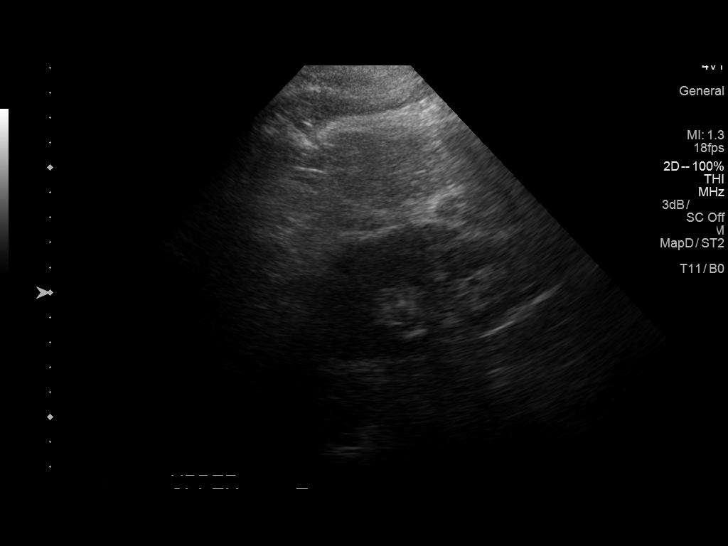
[im 9/11]
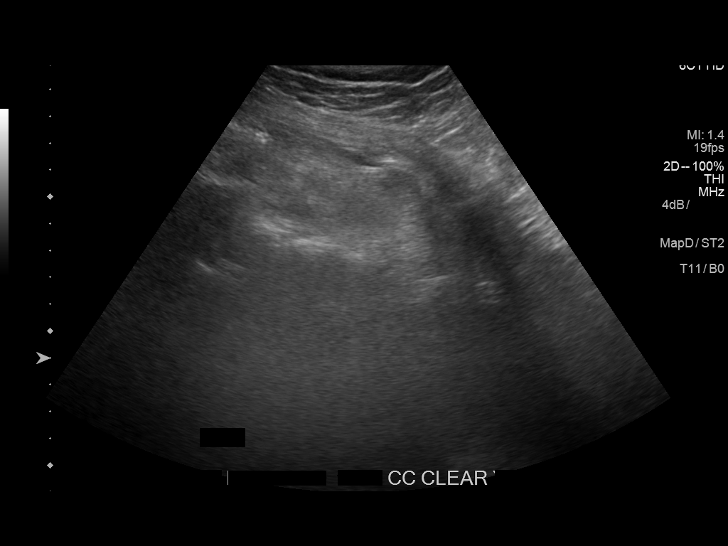
[im 10/11]
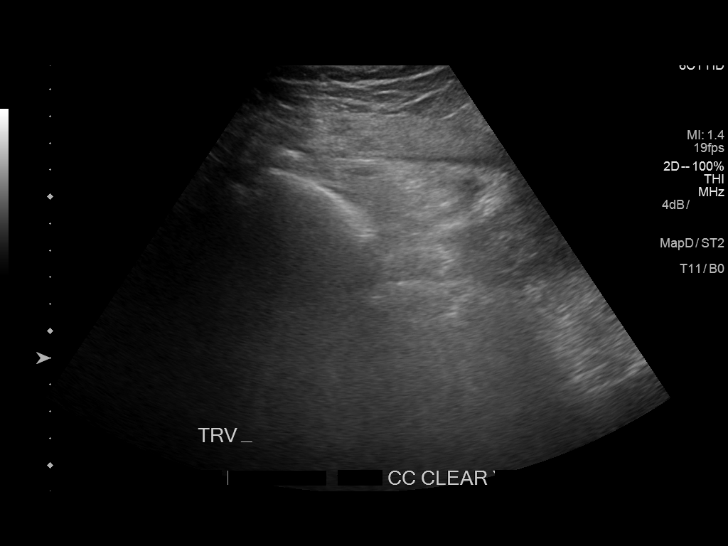
[im 11/11]
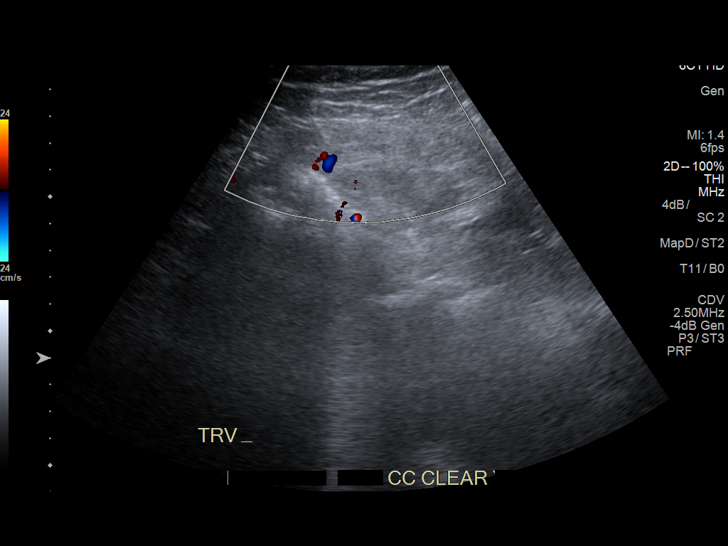

[11 of 11 positions shown; findings below may reference images not displayed]

The patient has recently experienced a spontaneous rupture of
umbilical hernia of approximately 1 month prior.

He has had surgical evaluation, and repair has been deferred at this
time.

He returns today re- accumulating ascites.

EXAM:
ULTRASOUND GUIDED  PARACENTESIS

MEDICATIONS:
None.

COMPLICATIONS:
None

PROCEDURE:
Informed written consent was obtained from the patient after a
discussion of the risks, benefits and alternatives to treatment. A
timeout was performed prior to the initiation of the procedure.

Initial ultrasound scanning demonstrates a large amount of ascites
within the right lower abdominal quadrant. The right lower abdomen
was prepped and draped in the usual sterile fashion. 1% lidocaine
with epinephrine was used for local anesthesia.

Following this, a Safe-T-Centesis catheter was introduced. An
ultrasound image was saved for documentation purposes. The
paracentesis was performed. The catheter was removed and a dressing
was applied. The patient tolerated the procedure well without
immediate post procedural complication.
FINDINGS: A total of approximately 9.750 of hazy yellow fluid was removed.
IMPRESSION: Status post ultrasound-guided paracentesis with 9.750 L of hazy
yellow fluid removed.

## 2018-08-03 IMAGING — US US PARACENTESIS
1 series · 5 of 5 positions shown · non-contrast
Comparison: none

INDICATION: Alcoholic cirrhosis of liver with ascites.

[Series 1: us paracentesis · 0.25mm/px · 5 of 5 slices shown]
[im 1/5]
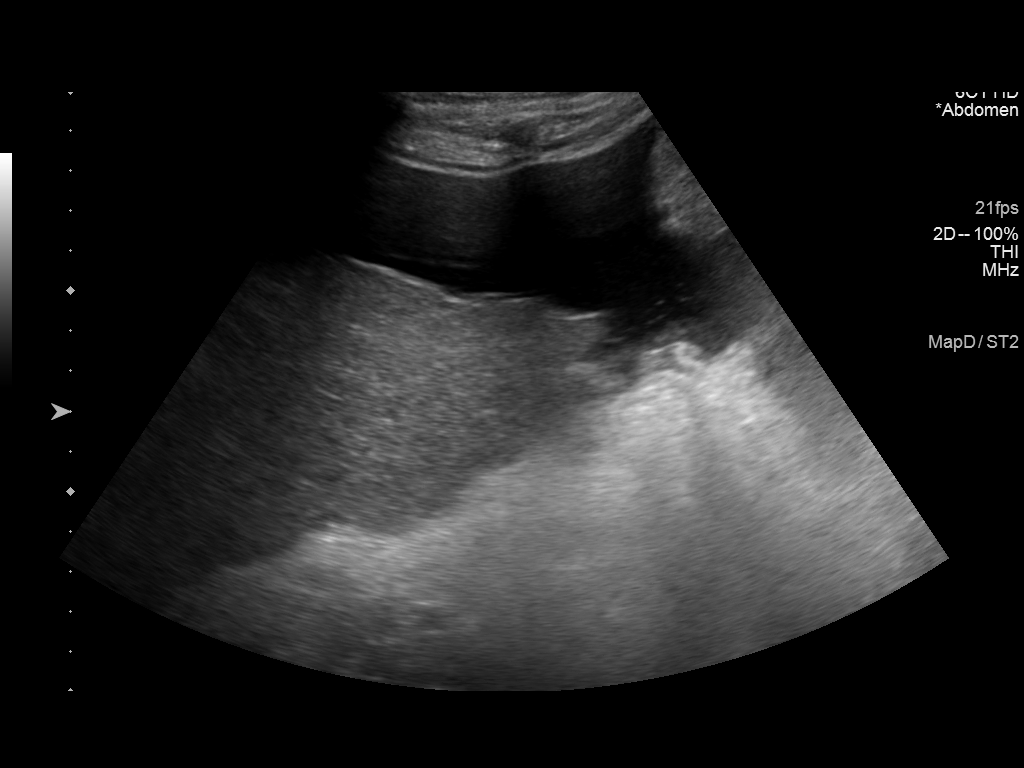
[im 2/5]
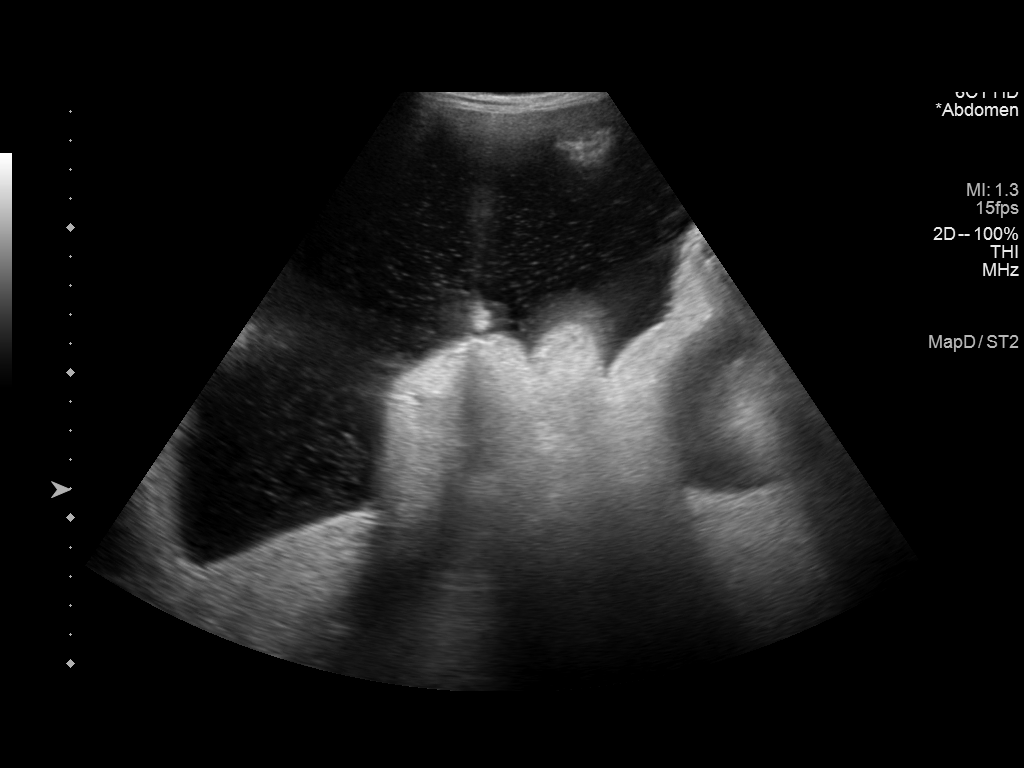
[im 3/5]
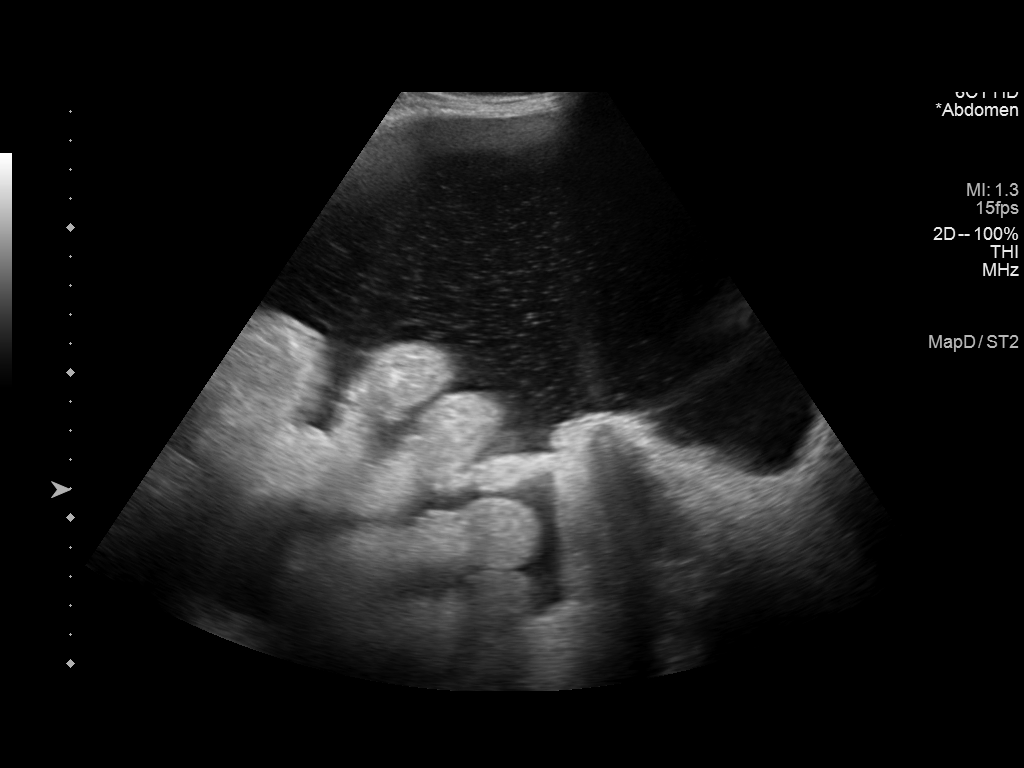
[im 4/5]
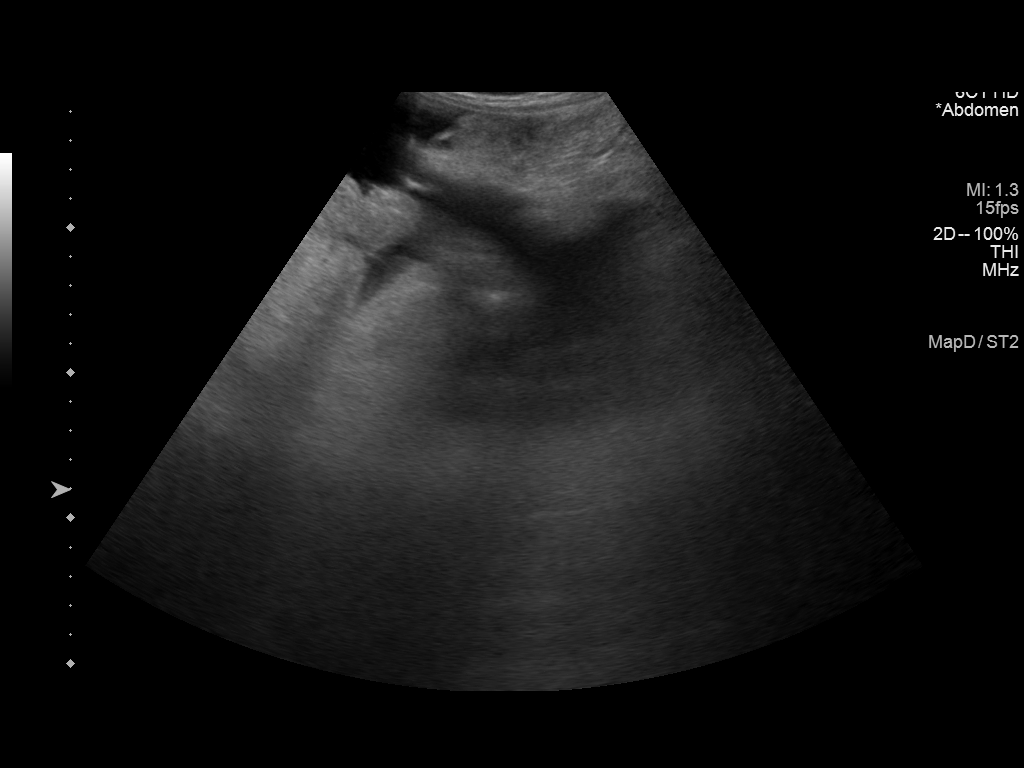
[im 5/5]
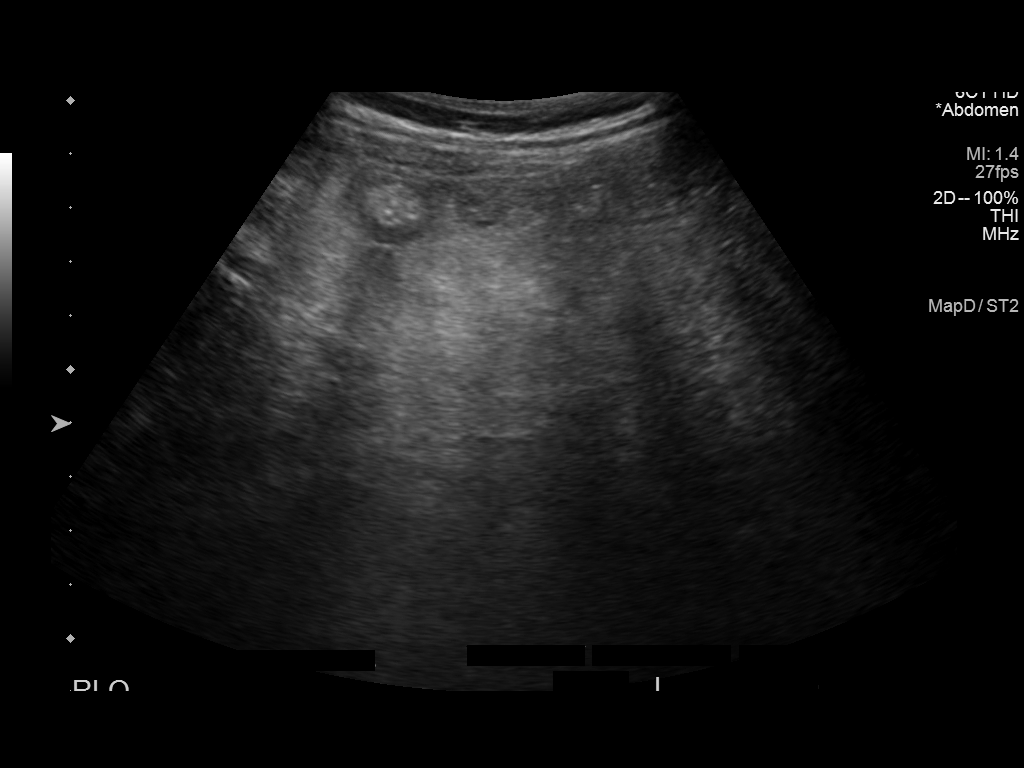

[5 of 5 positions shown; findings below may reference images not displayed]

EXAM:
ULTRASOUND GUIDED therapeutic PARACENTESIS

MEDICATIONS:
None.

COMPLICATIONS:
None immediate.

PROCEDURE:
Informed written consent was obtained from the patient after a
discussion of the risks, benefits and alternatives to treatment. A
timeout was performed prior to the initiation of the procedure.

Initial ultrasound scanning demonstrates a large amount of ascites
within the right lower abdominal quadrant. The right lower abdomen
was prepped and draped in the usual sterile fashion. 1% lidocaine
with epinephrine was used for local anesthesia.

Following this, a Safe-T-Centesis catheter was introduced. An
ultrasound image was saved for documentation purposes. The
paracentesis was performed. The catheter was removed and a dressing
was applied. The patient tolerated the procedure well without
immediate post procedural complication.
FINDINGS: A total of approximately 12.5 L of serous fluid was removed.
IMPRESSION: Successful ultrasound-guided paracentesis yielding 12.5 liters of
peritoneal fluid.

## 2018-09-05 IMAGING — US US PARACENTESIS
1 series · 11 of 11 positions shown · non-contrast
Comparison: none

INDICATION: Ascites

[Series 1: us paracentesis · 0.22mm/px · 11 of 11 slices shown]
[im 1/11]
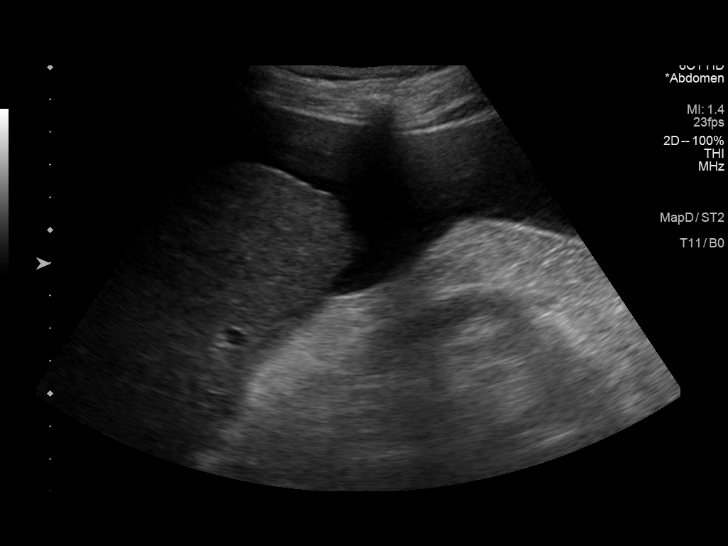
[im 2/11]
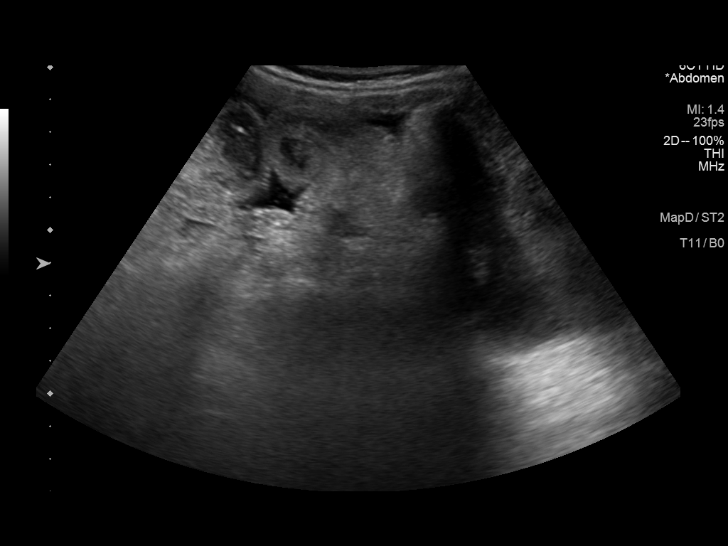
[im 3/11]
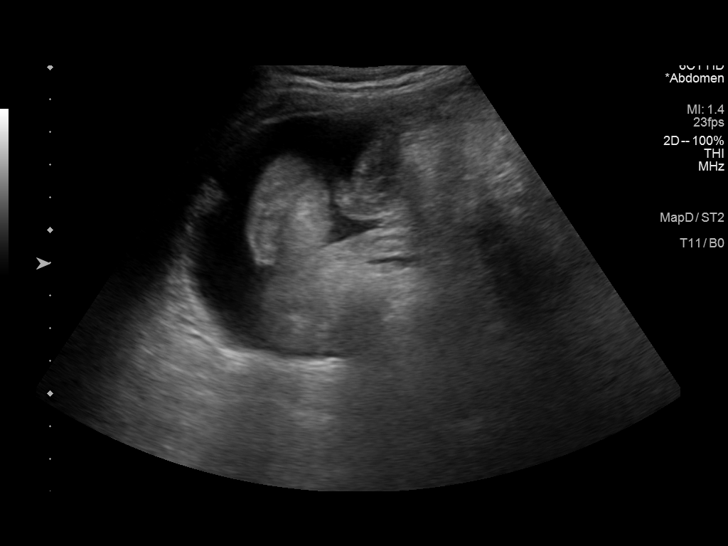
[im 4/11]
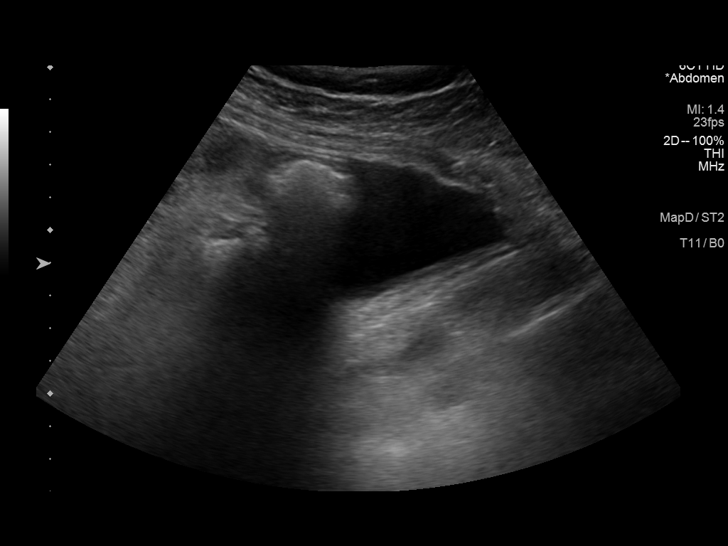
[im 5/11]
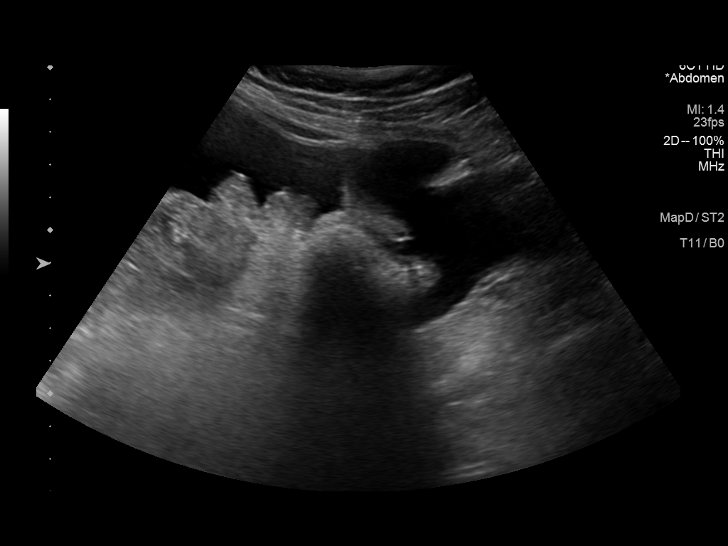
[im 6/11]
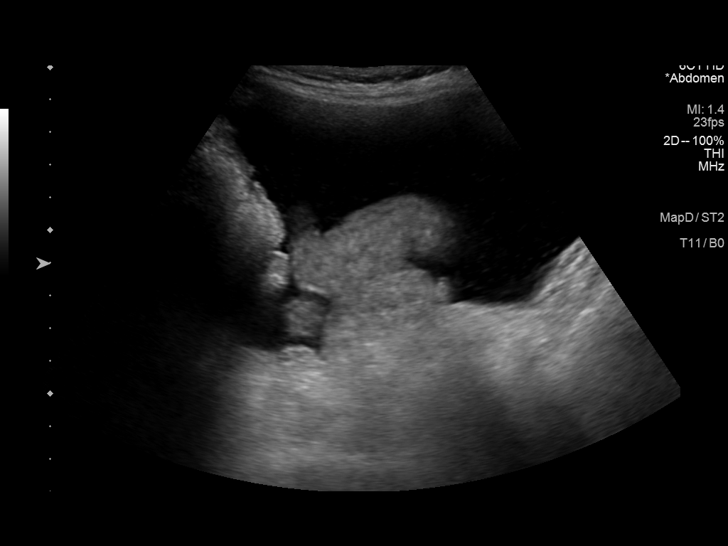
[im 7/11]
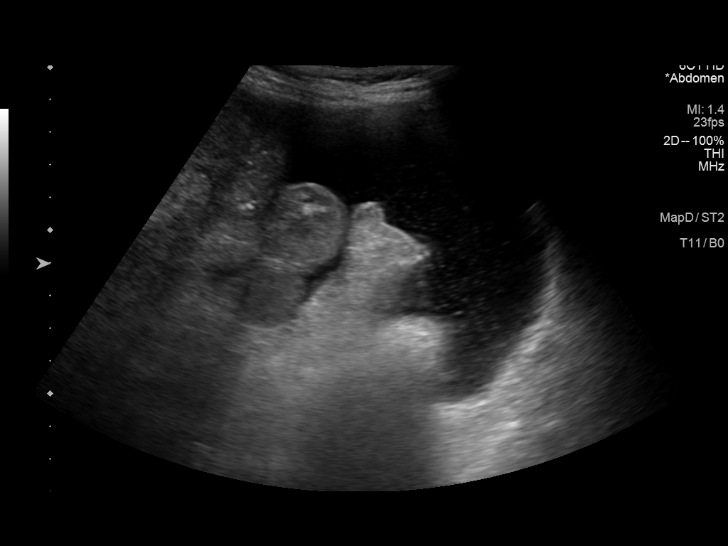
[im 8/11]
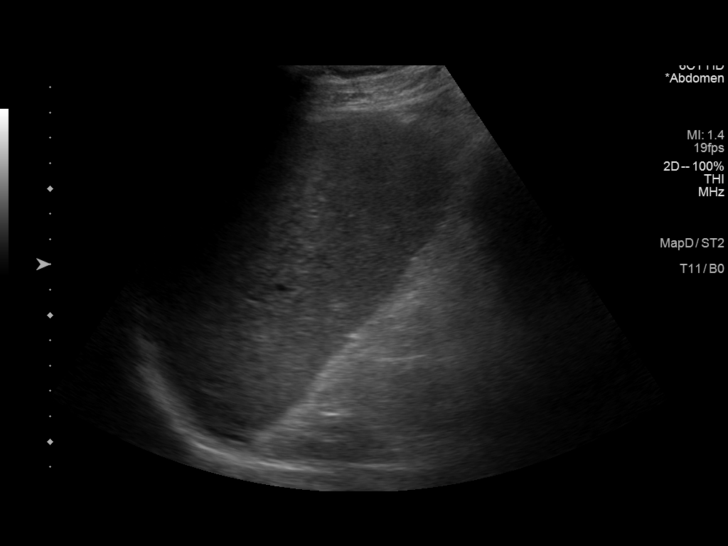
[im 9/11]
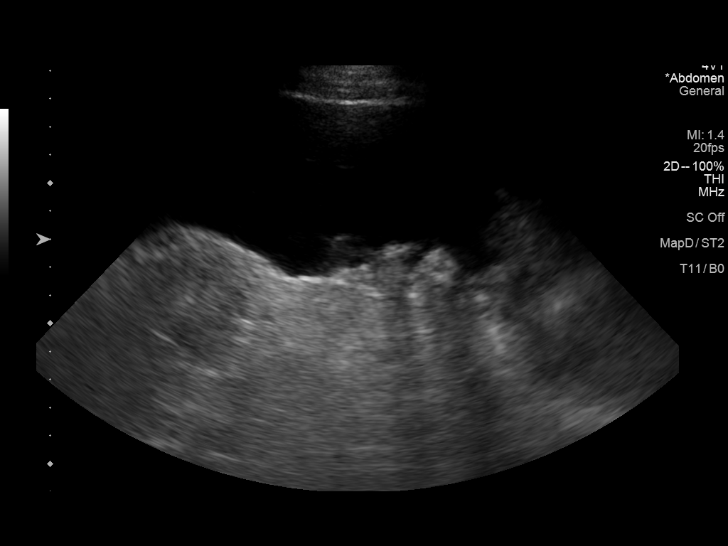
[im 10/11]
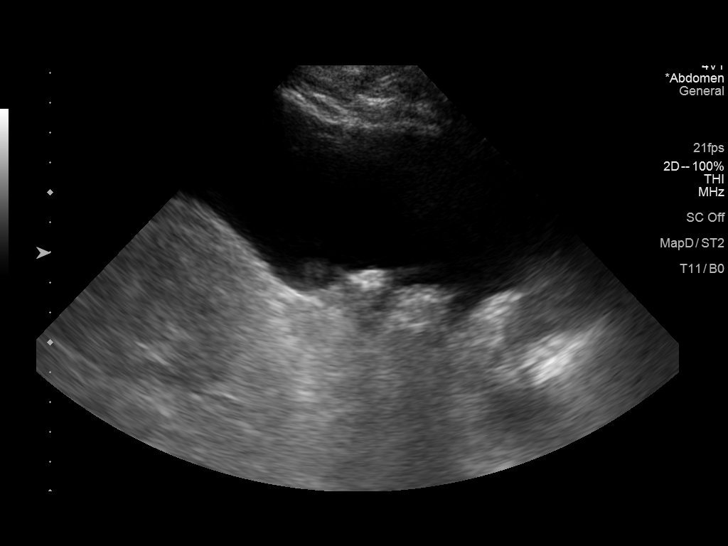
[im 11/11]
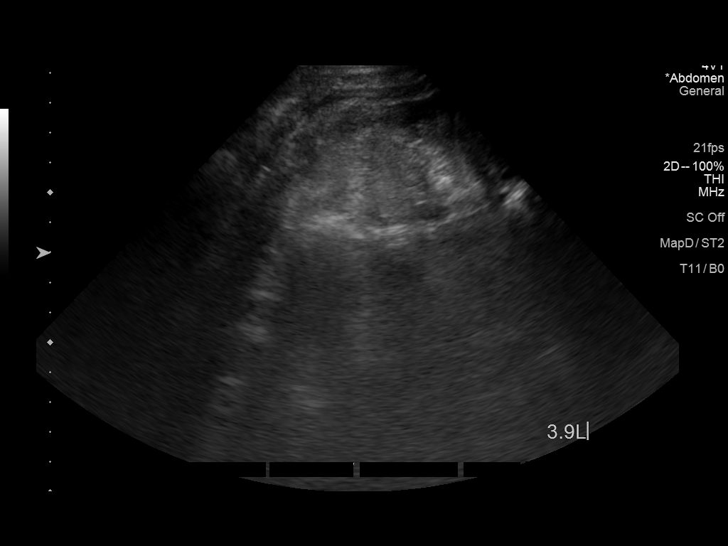

[11 of 11 positions shown; findings below may reference images not displayed]

EXAM:
ULTRASOUND GUIDED PARACENTESIS

MEDICATIONS:
None.

COMPLICATIONS:
None immediate.

PROCEDURE:
Informed written consent was obtained from the patient after a
discussion of the risks, benefits and alternatives to treatment. A
timeout was performed prior to the initiation of the procedure.

Initial ultrasound scanning demonstrates a large amount of ascites
within the right mid abdomen. The right abdomen was prepped and
draped in the usual sterile fashion. 1% lidocaine was used for local
anesthesia.

Following this, a 6 Fr Safe-T-Centesis catheter was introduced. An
ultrasound image was saved for documentation purposes. The
paracentesis was performed. The catheter was removed and a dressing
was applied. The patient tolerated the procedure well without
immediate post procedural complication.
FINDINGS: A total of approximately 3.9 L of clear yellow fluid was removed.
IMPRESSION: Successful ultrasound-guided paracentesis yielding 3.9 liters of
peritoneal fluid.

## 2018-12-21 IMAGING — US US PARACENTESIS
1 series · 4 of 4 positions shown · non-contrast
Comparison: Multiple previous ultrasound-guided paracenteses.

MEDICATIONS:
None.

COMPLICATIONS:
None immediate.

INDICATION: Recurrent ascites.

EXAM:
ULTRASOUND-GUIDED PARACENTESIS
TECHNIQUE: Informed written consent was obtained from the patient after a
discussion of the risks, benefits and alternatives to treatment. A
timeout was performed prior to the initiation of the procedure.

[Series 1: us paracentesis · 0.26mm/px · 4 of 4 slices shown]
[im 1/4]
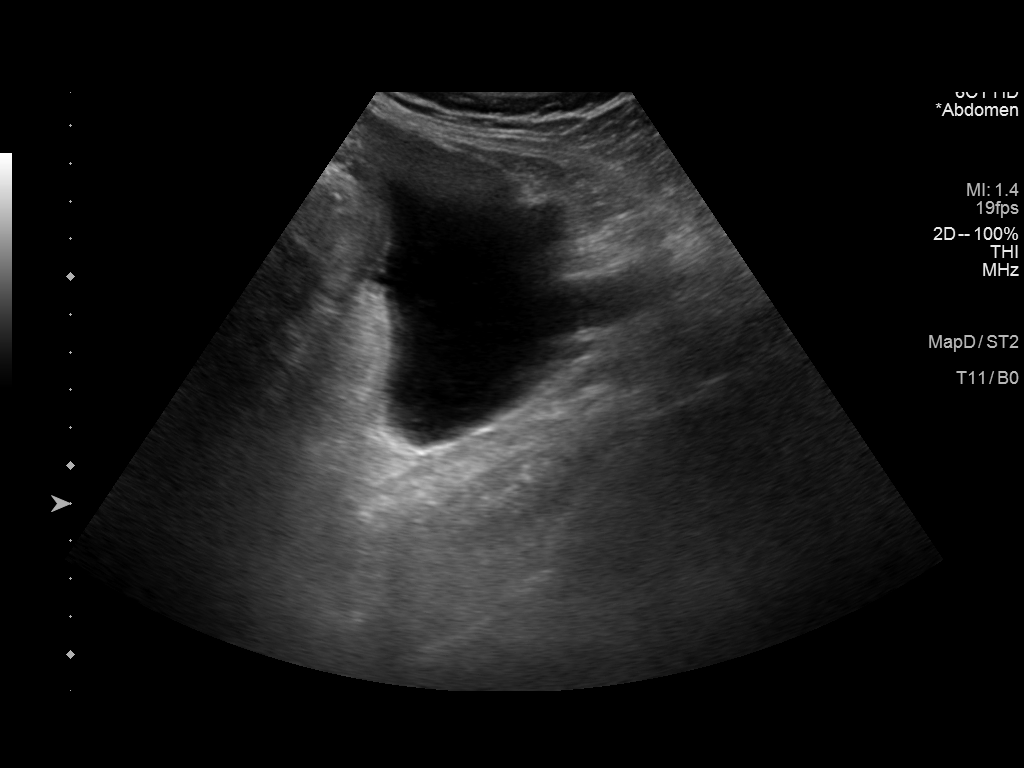
[im 2/4]
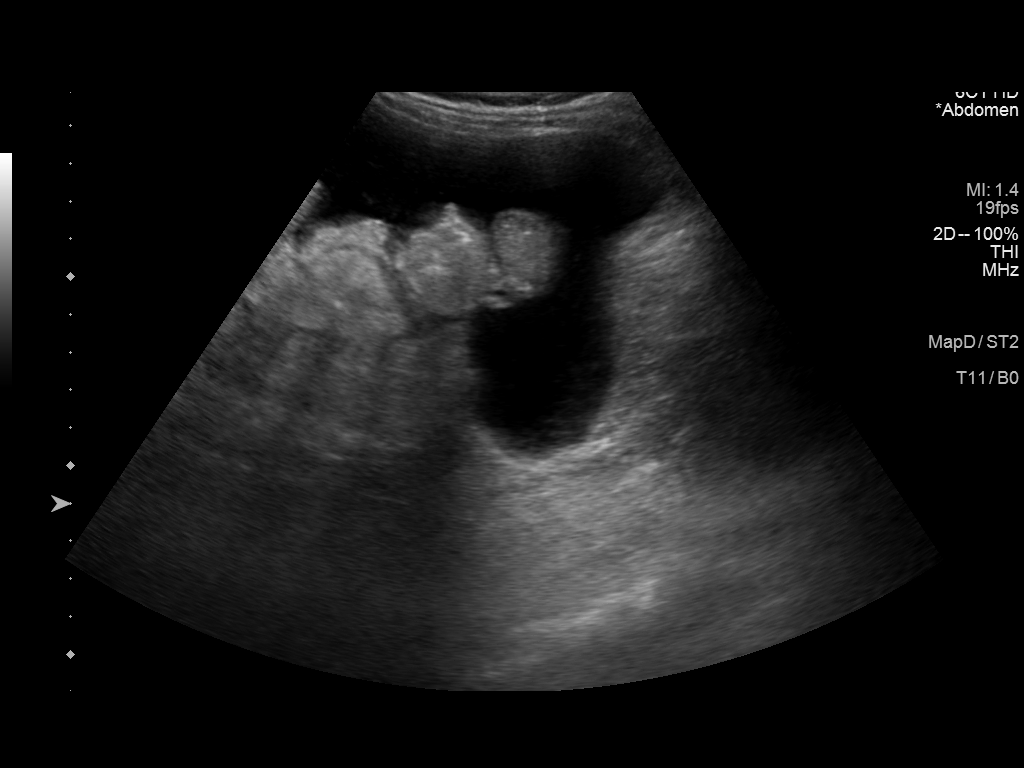
[im 3/4]
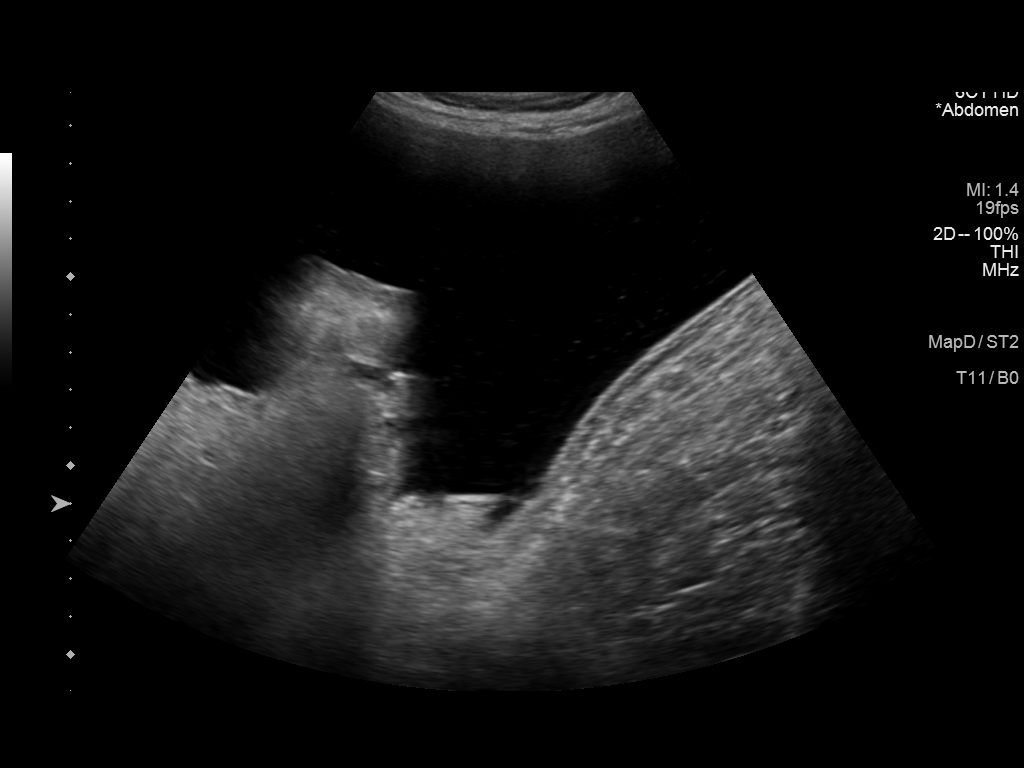
[im 4/4]
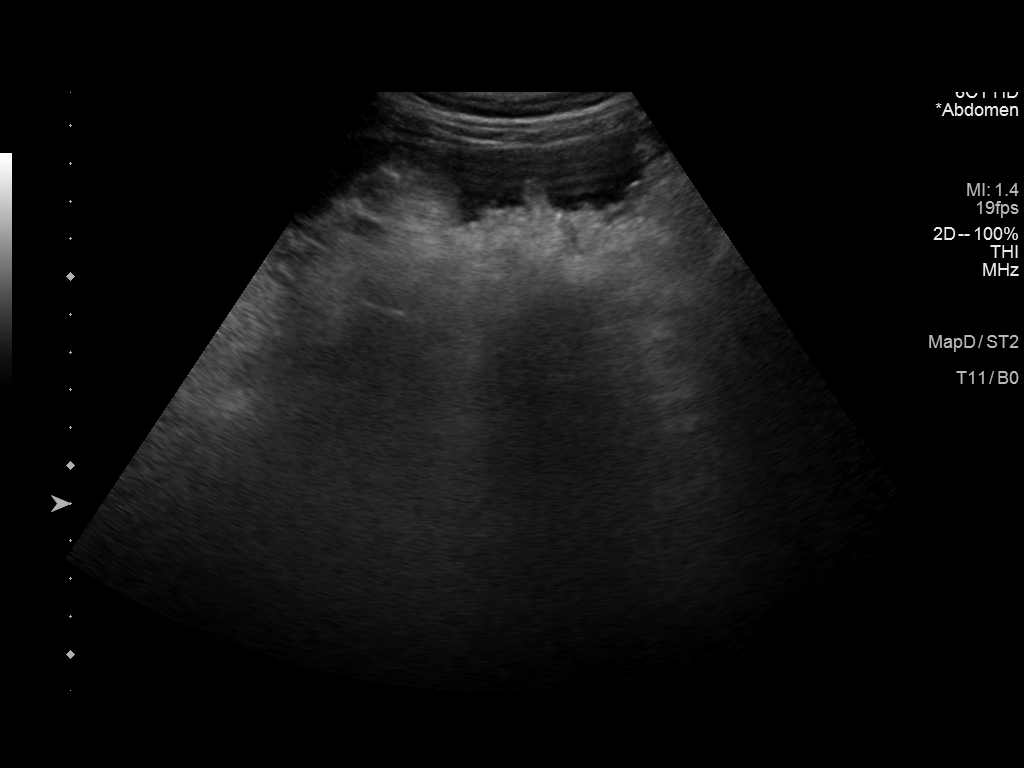

[4 of 4 positions shown; findings below may reference images not displayed]

Initial ultrasound scanning demonstrates a moderate to large amount
of ascites within the left lower abdominal quadrant. The left lower
abdomen was prepped and draped in the usual sterile fashion. 1%
lidocaine with epinephrine was used for local anesthesia. An
ultrasound image was saved for documentation purposed. An 8 Fr
Safe-T-Centesis catheter was introduced. The paracentesis was
performed. The catheter was removed and a dressing was applied. The
patient tolerated the procedure well without immediate post
procedural complication.
FINDINGS: A total of approximately 8 liters of serous fluid was removed.
IMPRESSION: Successful ultrasound-guided paracentesis yielding 8 liters of
peritoneal fluid.

## 2018-12-28 IMAGING — US US PARACENTESIS
1 series · 9 of 9 positions shown · non-contrast
Comparison: none

INDICATION: Recurrent ascites.

[Series 1: us paracentesis · 0.27mm/px · 9 of 9 slices shown]
[im 1/9]
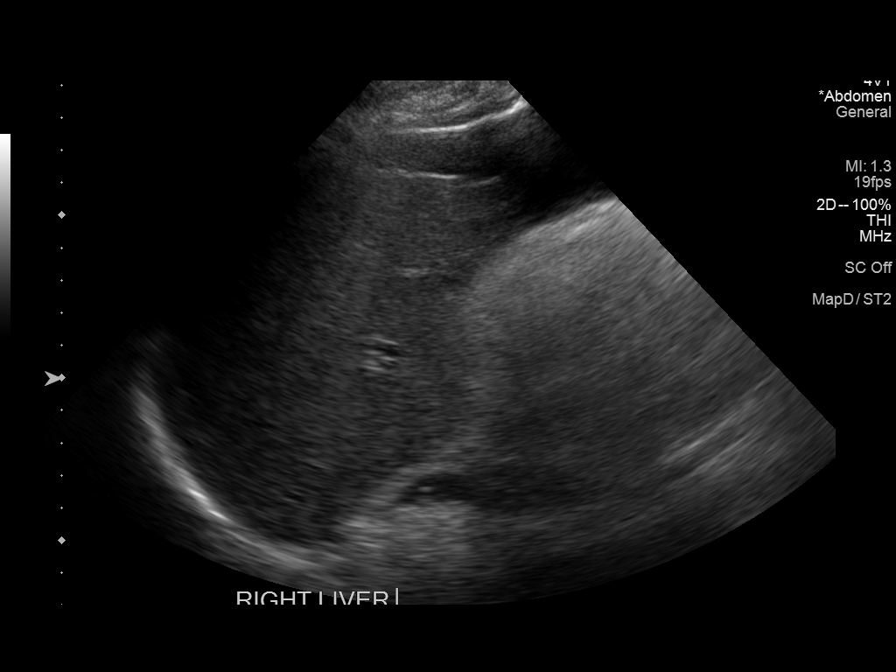
[im 2/9]
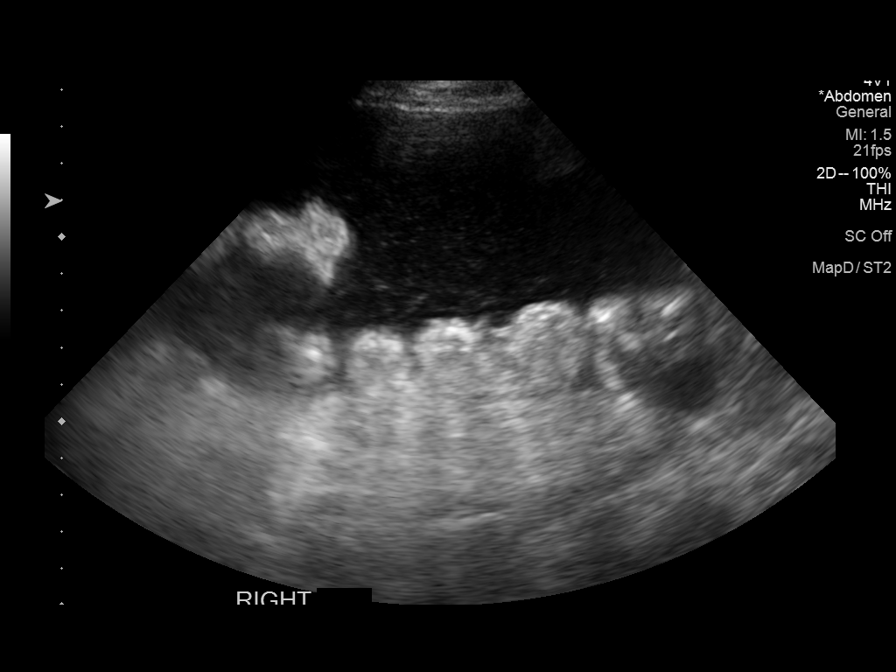
[im 3/9]
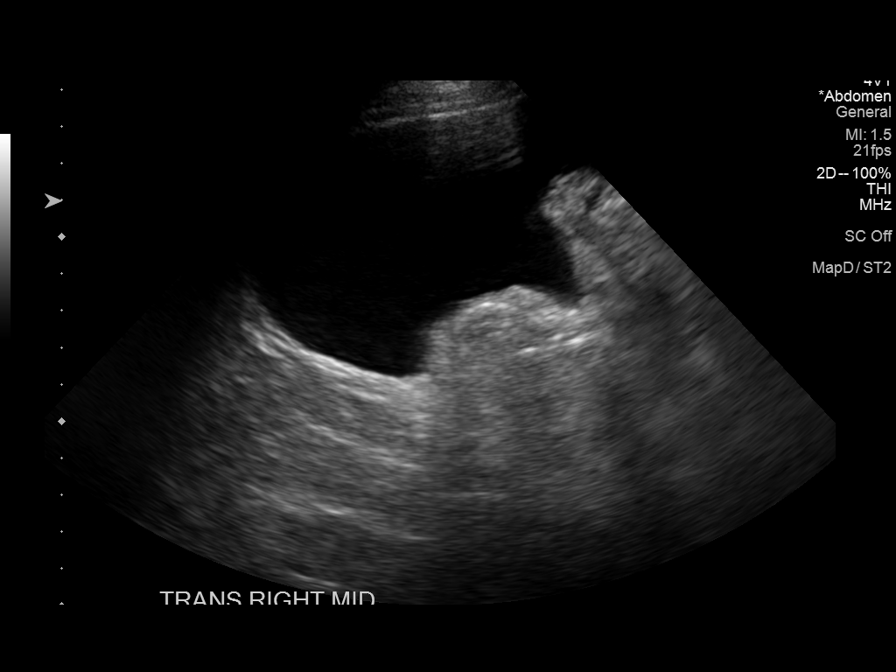
[im 4/9]
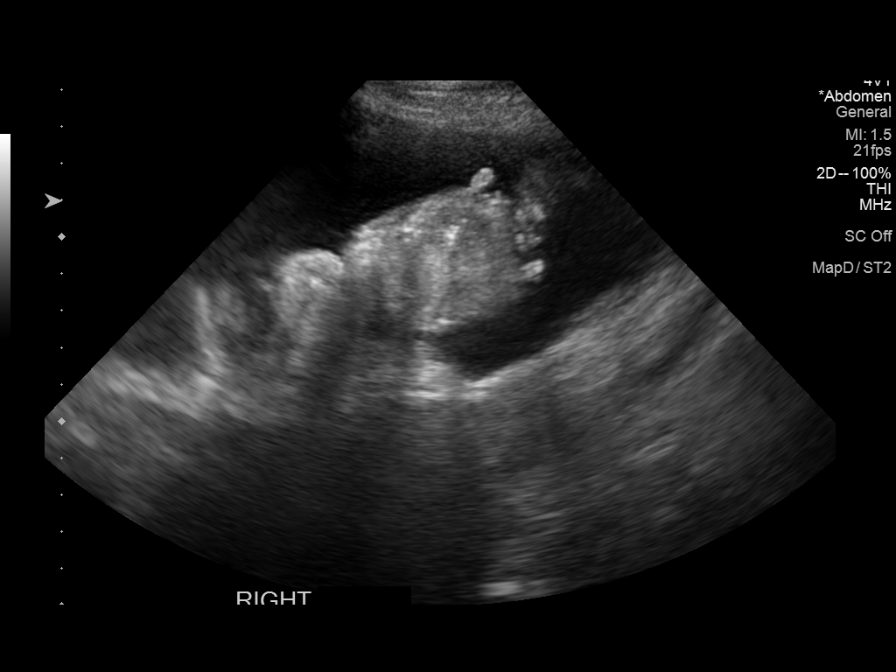
[im 5/9]
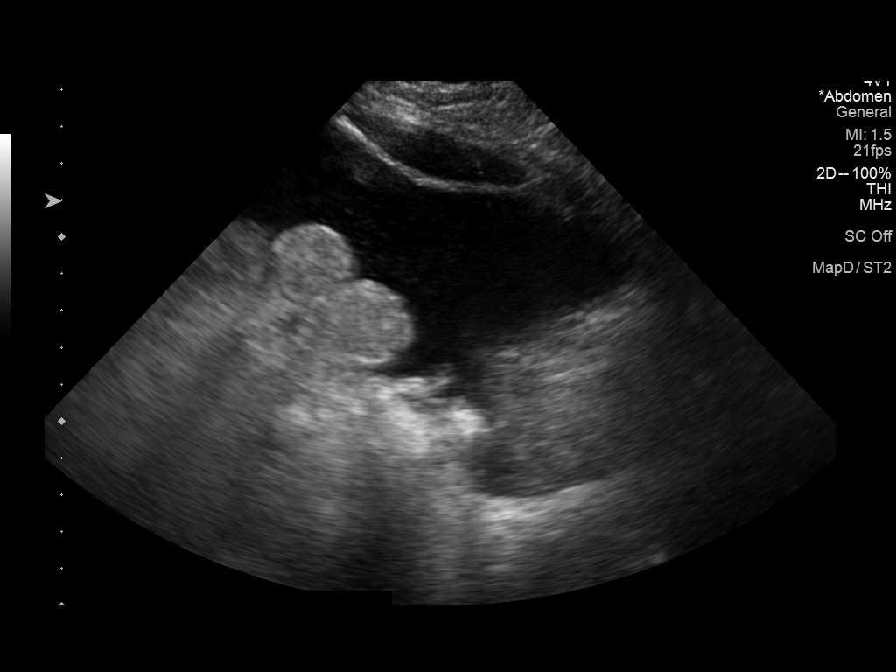
[im 6/9]
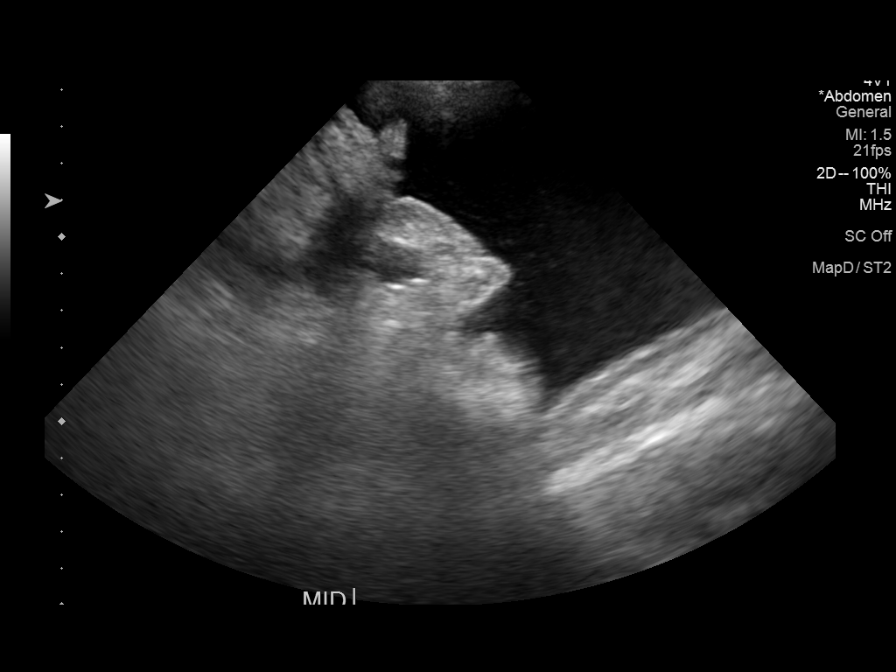
[im 7/9]
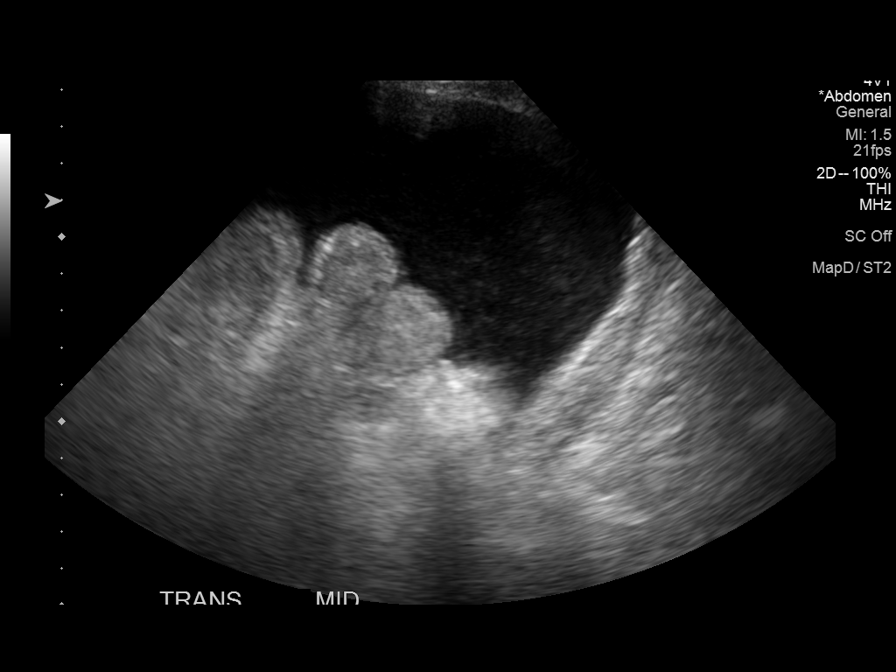
[im 8/9]
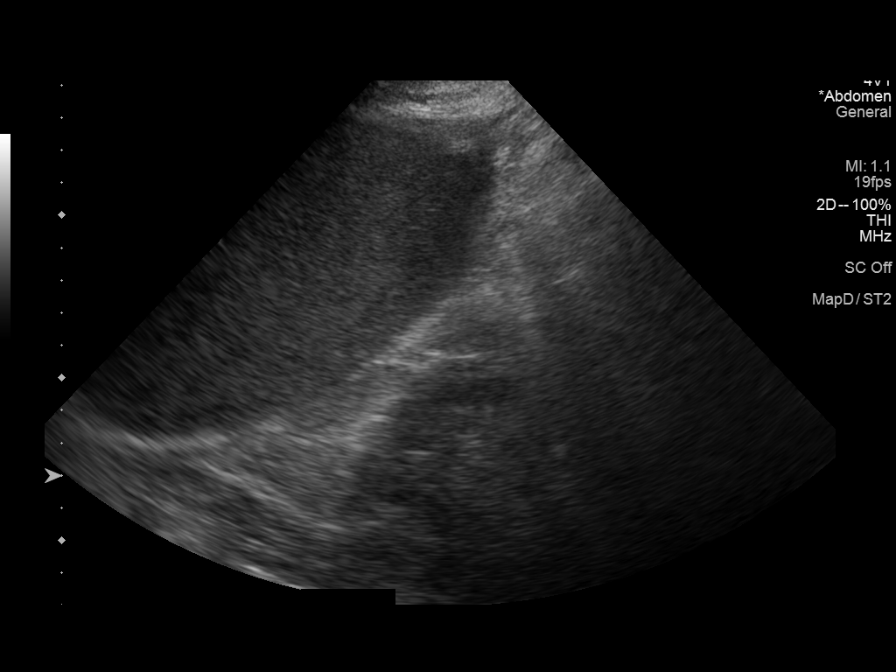
[im 9/9]
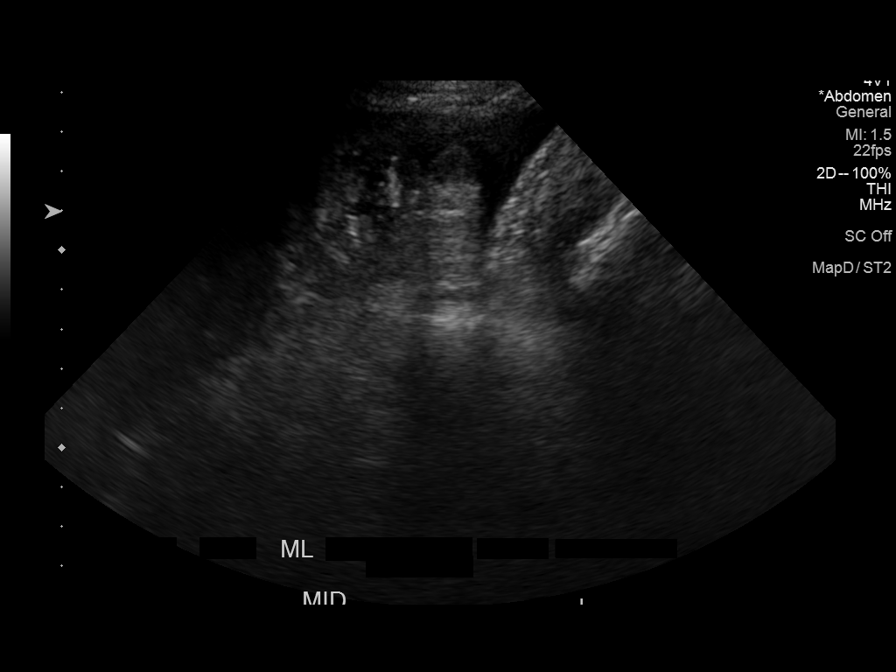

[9 of 9 positions shown; findings below may reference images not displayed]

EXAM:
ULTRASOUND GUIDED PARACENTESIS

MEDICATIONS:
None.

COMPLICATIONS:
None immediate.

PROCEDURE:
Informed written consent was obtained from the patient after a
discussion of the risks, benefits and alternatives to treatment. A
timeout was performed prior to the initiation of the procedure.

Initial ultrasound scanning demonstrates a large amount of ascites
within the left lower abdominal quadrant. The left lower abdomen was
prepped and draped in the usual sterile fashion. 1% lidocaine was
used for local anesthesia.

Following this, a 6 Fr Safe-T-Centesis catheter was introduced. An
ultrasound image was saved for documentation purposes. The
paracentesis was performed. The catheter was removed and a dressing
was applied. The patient tolerated the procedure well without
immediate post procedural complication.
FINDINGS: A total of approximately 5.5 L of cloudy yellow fluid was removed.
IMPRESSION: Successful ultrasound-guided paracentesis yielding 5.5 liters of
peritoneal fluid.
